# Patient Record
Sex: Female | Born: 1960 | Race: Black or African American | Hispanic: No | Smoking: Never smoker
Health system: Southern US, Community
[De-identification: ages and names within clinical notes are randomized; demographics above are authoritative.]

## PROBLEM LIST (undated history)

## (undated) DIAGNOSIS — K219 Gastro-esophageal reflux disease without esophagitis: Secondary | ICD-10-CM

## (undated) DIAGNOSIS — M75102 Unspecified rotator cuff tear or rupture of left shoulder, not specified as traumatic: Secondary | ICD-10-CM

## (undated) DIAGNOSIS — K644 Residual hemorrhoidal skin tags: Secondary | ICD-10-CM

## (undated) DIAGNOSIS — Z8719 Personal history of other diseases of the digestive system: Secondary | ICD-10-CM

## (undated) DIAGNOSIS — I10 Essential (primary) hypertension: Secondary | ICD-10-CM

## (undated) DIAGNOSIS — Z973 Presence of spectacles and contact lenses: Secondary | ICD-10-CM

## (undated) DIAGNOSIS — F32A Depression, unspecified: Secondary | ICD-10-CM

## (undated) DIAGNOSIS — M179 Osteoarthritis of knee, unspecified: Secondary | ICD-10-CM

## (undated) DIAGNOSIS — E785 Hyperlipidemia, unspecified: Secondary | ICD-10-CM

## (undated) DIAGNOSIS — E119 Type 2 diabetes mellitus without complications: Secondary | ICD-10-CM

## (undated) DIAGNOSIS — F329 Major depressive disorder, single episode, unspecified: Secondary | ICD-10-CM

## (undated) DIAGNOSIS — I2699 Other pulmonary embolism without acute cor pulmonale: Secondary | ICD-10-CM

## (undated) DIAGNOSIS — M1710 Unilateral primary osteoarthritis, unspecified knee: Secondary | ICD-10-CM

## (undated) DIAGNOSIS — K6289 Other specified diseases of anus and rectum: Secondary | ICD-10-CM

## (undated) DIAGNOSIS — G5602 Carpal tunnel syndrome, left upper limb: Secondary | ICD-10-CM

## (undated) DIAGNOSIS — M5137 Other intervertebral disc degeneration, lumbosacral region: Secondary | ICD-10-CM

## (undated) DIAGNOSIS — M1990 Unspecified osteoarthritis, unspecified site: Secondary | ICD-10-CM

## (undated) DIAGNOSIS — Z87442 Personal history of urinary calculi: Secondary | ICD-10-CM

## (undated) DIAGNOSIS — E669 Obesity, unspecified: Secondary | ICD-10-CM

## (undated) DIAGNOSIS — N938 Other specified abnormal uterine and vaginal bleeding: Secondary | ICD-10-CM

## (undated) DIAGNOSIS — J189 Pneumonia, unspecified organism: Secondary | ICD-10-CM

## (undated) DIAGNOSIS — D649 Anemia, unspecified: Secondary | ICD-10-CM

## (undated) DIAGNOSIS — F419 Anxiety disorder, unspecified: Secondary | ICD-10-CM

## (undated) DIAGNOSIS — Z794 Long term (current) use of insulin: Secondary | ICD-10-CM

## (undated) DIAGNOSIS — E042 Nontoxic multinodular goiter: Secondary | ICD-10-CM

## (undated) DIAGNOSIS — I499 Cardiac arrhythmia, unspecified: Secondary | ICD-10-CM

## (undated) DIAGNOSIS — N390 Urinary tract infection, site not specified: Secondary | ICD-10-CM

## (undated) DIAGNOSIS — K449 Diaphragmatic hernia without obstruction or gangrene: Secondary | ICD-10-CM

## (undated) DIAGNOSIS — M51379 Other intervertebral disc degeneration, lumbosacral region without mention of lumbar back pain or lower extremity pain: Secondary | ICD-10-CM

## (undated) HISTORY — DX: Residual hemorrhoidal skin tags: K64.4

## (undated) HISTORY — DX: Hyperlipidemia, unspecified: E78.5

## (undated) HISTORY — DX: Urinary tract infection, site not specified: N39.0

## (undated) HISTORY — PX: REPLACEMENT TOTAL KNEE BILATERAL: SUR1225

## (undated) HISTORY — DX: Gastro-esophageal reflux disease without esophagitis: K21.9

## (undated) HISTORY — PX: KNEE ARTHROSCOPY: SUR90

## (undated) HISTORY — DX: Major depressive disorder, single episode, unspecified: F32.9

## (undated) HISTORY — PX: CYSTOSCOPY/URETEROSCOPY/HOLMIUM LASER/STENT PLACEMENT: SHX6546

## (undated) HISTORY — DX: Depression, unspecified: F32.A

## (undated) HISTORY — DX: Other specified abnormal uterine and vaginal bleeding: N93.8

## (undated) HISTORY — PX: CHOLECYSTECTOMY: SHX55

## (undated) HISTORY — DX: Type 2 diabetes mellitus without complications: E11.9

## (undated) HISTORY — DX: Unspecified rotator cuff tear or rupture of left shoulder, not specified as traumatic: M75.102

## (undated) HISTORY — PX: LITHOTRIPSY: SUR834

## (undated) HISTORY — DX: Unilateral primary osteoarthritis, unspecified knee: M17.10

## (undated) HISTORY — PX: TUBAL LIGATION: SHX77

## (undated) HISTORY — PX: JOINT REPLACEMENT: SHX530

## (undated) HISTORY — PX: HERNIA REPAIR: SHX51

## (undated) HISTORY — DX: Osteoarthritis of knee, unspecified: M17.9

## (undated) HISTORY — DX: Morbid (severe) obesity due to excess calories: E66.01

## (undated) HISTORY — DX: Carpal tunnel syndrome, left upper limb: G56.02

## (undated) HISTORY — DX: Anxiety disorder, unspecified: F41.9

## (undated) HISTORY — DX: Essential (primary) hypertension: I10

---

## 1986-04-11 HISTORY — PX: CHOLECYSTECTOMY OPEN: SUR202

## 1997-09-25 ENCOUNTER — Encounter: Admission: RE | Admit: 1997-09-25 | Discharge: 1997-09-25 | Payer: Self-pay | Admitting: Hematology and Oncology

## 1997-09-25 ENCOUNTER — Encounter: Admission: RE | Admit: 1997-09-25 | Discharge: 1997-09-25 | Payer: Self-pay | Admitting: Obstetrics

## 1997-11-03 ENCOUNTER — Encounter: Admission: RE | Admit: 1997-11-03 | Discharge: 1997-11-03 | Payer: Self-pay | Admitting: Internal Medicine

## 1998-04-02 ENCOUNTER — Encounter: Admission: RE | Admit: 1998-04-02 | Discharge: 1998-04-02 | Payer: Self-pay | Admitting: Hematology and Oncology

## 1998-04-21 ENCOUNTER — Encounter: Admission: RE | Admit: 1998-04-21 | Discharge: 1998-07-20 | Payer: Self-pay | Admitting: Orthopedic Surgery

## 1998-08-25 ENCOUNTER — Encounter: Admission: RE | Admit: 1998-08-25 | Discharge: 1998-08-25 | Payer: Self-pay | Admitting: Internal Medicine

## 1998-09-29 ENCOUNTER — Encounter: Admission: RE | Admit: 1998-09-29 | Discharge: 1998-09-29 | Payer: Self-pay | Admitting: Internal Medicine

## 1998-10-15 ENCOUNTER — Encounter: Admission: RE | Admit: 1998-10-15 | Discharge: 1998-10-15 | Payer: Self-pay | Admitting: Hematology and Oncology

## 1998-11-25 ENCOUNTER — Encounter: Admission: RE | Admit: 1998-11-25 | Discharge: 1998-11-25 | Payer: Self-pay | Admitting: Internal Medicine

## 1999-01-21 ENCOUNTER — Encounter: Admission: RE | Admit: 1999-01-21 | Discharge: 1999-01-21 | Payer: Self-pay | Admitting: Hematology and Oncology

## 1999-03-25 ENCOUNTER — Encounter: Admission: RE | Admit: 1999-03-25 | Discharge: 1999-03-25 | Payer: Self-pay | Admitting: Obstetrics

## 1999-03-30 ENCOUNTER — Encounter: Admission: RE | Admit: 1999-03-30 | Discharge: 1999-03-30 | Payer: Self-pay | Admitting: Internal Medicine

## 1999-05-12 ENCOUNTER — Emergency Department (HOSPITAL_COMMUNITY): Admission: EM | Admit: 1999-05-12 | Discharge: 1999-05-12 | Payer: Self-pay | Admitting: Emergency Medicine

## 1999-06-09 ENCOUNTER — Encounter: Admission: RE | Admit: 1999-06-09 | Discharge: 1999-06-09 | Payer: Self-pay | Admitting: Internal Medicine

## 1999-11-08 ENCOUNTER — Encounter: Admission: RE | Admit: 1999-11-08 | Discharge: 1999-11-08 | Payer: Self-pay | Admitting: Internal Medicine

## 1999-12-01 ENCOUNTER — Ambulatory Visit (HOSPITAL_BASED_OUTPATIENT_CLINIC_OR_DEPARTMENT_OTHER): Admission: RE | Admit: 1999-12-01 | Discharge: 1999-12-01 | Payer: Self-pay | Admitting: Orthopedic Surgery

## 2000-05-02 ENCOUNTER — Inpatient Hospital Stay (HOSPITAL_COMMUNITY): Admission: AD | Admit: 2000-05-02 | Discharge: 2000-05-02 | Payer: Self-pay | Admitting: *Deleted

## 2000-05-03 ENCOUNTER — Inpatient Hospital Stay (HOSPITAL_COMMUNITY): Admission: AD | Admit: 2000-05-03 | Discharge: 2000-05-03 | Payer: Self-pay | Admitting: *Deleted

## 2000-05-03 ENCOUNTER — Encounter: Payer: Self-pay | Admitting: *Deleted

## 2000-05-03 ENCOUNTER — Inpatient Hospital Stay (HOSPITAL_COMMUNITY): Admission: AD | Admit: 2000-05-03 | Discharge: 2000-05-03 | Payer: Self-pay | Admitting: Obstetrics

## 2000-07-24 ENCOUNTER — Encounter: Admission: RE | Admit: 2000-07-24 | Discharge: 2000-07-24 | Payer: Self-pay

## 2000-10-25 ENCOUNTER — Encounter: Admission: RE | Admit: 2000-10-25 | Discharge: 2000-10-25 | Payer: Self-pay | Admitting: Orthopedic Surgery

## 2000-10-25 ENCOUNTER — Encounter: Payer: Self-pay | Admitting: Orthopedic Surgery

## 2000-11-02 ENCOUNTER — Emergency Department (HOSPITAL_COMMUNITY): Admission: EM | Admit: 2000-11-02 | Discharge: 2000-11-03 | Payer: Self-pay

## 2000-12-27 ENCOUNTER — Encounter: Admission: RE | Admit: 2000-12-27 | Discharge: 2000-12-27 | Payer: Self-pay | Admitting: Orthopedic Surgery

## 2000-12-27 ENCOUNTER — Encounter: Payer: Self-pay | Admitting: Orthopedic Surgery

## 2001-04-05 ENCOUNTER — Inpatient Hospital Stay (HOSPITAL_COMMUNITY): Admission: AD | Admit: 2001-04-05 | Discharge: 2001-04-05 | Payer: Self-pay | Admitting: Internal Medicine

## 2001-06-04 ENCOUNTER — Encounter: Payer: Self-pay | Admitting: *Deleted

## 2001-06-04 ENCOUNTER — Inpatient Hospital Stay (HOSPITAL_COMMUNITY): Admission: EM | Admit: 2001-06-04 | Discharge: 2001-06-05 | Payer: Self-pay | Admitting: Emergency Medicine

## 2001-08-30 ENCOUNTER — Emergency Department (HOSPITAL_COMMUNITY): Admission: EM | Admit: 2001-08-30 | Discharge: 2001-08-30 | Payer: Self-pay | Admitting: Emergency Medicine

## 2001-08-30 ENCOUNTER — Encounter: Payer: Self-pay | Admitting: Emergency Medicine

## 2001-09-12 ENCOUNTER — Emergency Department (HOSPITAL_COMMUNITY): Admission: EM | Admit: 2001-09-12 | Discharge: 2001-09-13 | Payer: Self-pay | Admitting: Emergency Medicine

## 2001-12-10 ENCOUNTER — Emergency Department (HOSPITAL_COMMUNITY): Admission: EM | Admit: 2001-12-10 | Discharge: 2001-12-10 | Payer: Self-pay | Admitting: Emergency Medicine

## 2002-02-05 ENCOUNTER — Encounter: Admission: RE | Admit: 2002-02-05 | Discharge: 2002-02-05 | Payer: Self-pay | Admitting: Internal Medicine

## 2002-04-05 ENCOUNTER — Encounter: Admission: RE | Admit: 2002-04-05 | Discharge: 2002-04-05 | Payer: Self-pay | Admitting: Internal Medicine

## 2002-05-16 ENCOUNTER — Encounter: Admission: RE | Admit: 2002-05-16 | Discharge: 2002-05-16 | Payer: Self-pay | Admitting: Internal Medicine

## 2002-05-20 ENCOUNTER — Encounter: Admission: RE | Admit: 2002-05-20 | Discharge: 2002-05-20 | Payer: Self-pay | Admitting: Internal Medicine

## 2002-06-18 ENCOUNTER — Encounter: Admission: RE | Admit: 2002-06-18 | Discharge: 2002-06-18 | Payer: Self-pay | Admitting: Internal Medicine

## 2002-06-27 ENCOUNTER — Encounter (INDEPENDENT_AMBULATORY_CARE_PROVIDER_SITE_OTHER): Payer: Self-pay

## 2002-06-27 ENCOUNTER — Encounter: Admission: RE | Admit: 2002-06-27 | Discharge: 2002-06-27 | Payer: Self-pay | Admitting: Obstetrics and Gynecology

## 2002-06-27 ENCOUNTER — Other Ambulatory Visit: Admission: RE | Admit: 2002-06-27 | Discharge: 2002-06-27 | Payer: Self-pay | Admitting: Family Medicine

## 2002-08-06 ENCOUNTER — Encounter: Admission: RE | Admit: 2002-08-06 | Discharge: 2002-08-06 | Payer: Self-pay | Admitting: Internal Medicine

## 2002-08-15 ENCOUNTER — Encounter: Admission: RE | Admit: 2002-08-15 | Discharge: 2002-08-15 | Payer: Self-pay | Admitting: Obstetrics and Gynecology

## 2002-09-13 ENCOUNTER — Encounter: Admission: RE | Admit: 2002-09-13 | Discharge: 2002-12-12 | Payer: Self-pay | Admitting: Internal Medicine

## 2002-10-07 ENCOUNTER — Encounter: Admission: RE | Admit: 2002-10-07 | Discharge: 2002-10-07 | Payer: Self-pay | Admitting: Internal Medicine

## 2002-12-25 ENCOUNTER — Encounter: Admission: RE | Admit: 2002-12-25 | Discharge: 2002-12-25 | Payer: Self-pay | Admitting: Internal Medicine

## 2003-02-05 ENCOUNTER — Encounter: Admission: RE | Admit: 2003-02-05 | Discharge: 2003-02-05 | Payer: Self-pay | Admitting: Internal Medicine

## 2003-04-02 ENCOUNTER — Emergency Department (HOSPITAL_COMMUNITY): Admission: AD | Admit: 2003-04-02 | Discharge: 2003-04-02 | Payer: Self-pay | Admitting: Family Medicine

## 2003-04-02 ENCOUNTER — Encounter: Admission: RE | Admit: 2003-04-02 | Discharge: 2003-04-02 | Payer: Self-pay | Admitting: Internal Medicine

## 2003-04-23 ENCOUNTER — Encounter: Admission: RE | Admit: 2003-04-23 | Discharge: 2003-04-23 | Payer: Self-pay | Admitting: Internal Medicine

## 2003-05-16 ENCOUNTER — Encounter: Admission: RE | Admit: 2003-05-16 | Discharge: 2003-05-16 | Payer: Self-pay | Admitting: Internal Medicine

## 2003-06-09 ENCOUNTER — Encounter: Admission: RE | Admit: 2003-06-09 | Discharge: 2003-06-09 | Payer: Self-pay | Admitting: Internal Medicine

## 2003-06-12 ENCOUNTER — Encounter: Admission: RE | Admit: 2003-06-12 | Discharge: 2003-06-12 | Payer: Self-pay | Admitting: Internal Medicine

## 2003-06-19 ENCOUNTER — Encounter: Admission: RE | Admit: 2003-06-19 | Discharge: 2003-06-19 | Payer: Self-pay | Admitting: Internal Medicine

## 2003-07-01 ENCOUNTER — Encounter: Admission: RE | Admit: 2003-07-01 | Discharge: 2003-07-01 | Payer: Self-pay | Admitting: Internal Medicine

## 2003-07-01 ENCOUNTER — Ambulatory Visit (HOSPITAL_COMMUNITY): Admission: RE | Admit: 2003-07-01 | Discharge: 2003-07-01 | Payer: Self-pay | Admitting: Internal Medicine

## 2003-09-18 ENCOUNTER — Encounter: Admission: RE | Admit: 2003-09-18 | Discharge: 2003-09-18 | Payer: Self-pay | Admitting: Internal Medicine

## 2003-11-21 ENCOUNTER — Encounter: Admission: RE | Admit: 2003-11-21 | Discharge: 2003-11-21 | Payer: Self-pay | Admitting: Internal Medicine

## 2003-12-26 ENCOUNTER — Emergency Department (HOSPITAL_COMMUNITY): Admission: EM | Admit: 2003-12-26 | Discharge: 2003-12-26 | Payer: Self-pay | Admitting: Family Medicine

## 2004-02-19 ENCOUNTER — Ambulatory Visit: Payer: Self-pay | Admitting: Family Medicine

## 2004-02-24 ENCOUNTER — Inpatient Hospital Stay (HOSPITAL_COMMUNITY): Admission: AD | Admit: 2004-02-24 | Discharge: 2004-02-24 | Payer: Self-pay | Admitting: Obstetrics & Gynecology

## 2004-02-29 ENCOUNTER — Emergency Department (HOSPITAL_COMMUNITY): Admission: EM | Admit: 2004-02-29 | Discharge: 2004-02-29 | Payer: Self-pay | Admitting: Family Medicine

## 2004-03-25 ENCOUNTER — Encounter (INDEPENDENT_AMBULATORY_CARE_PROVIDER_SITE_OTHER): Payer: Self-pay | Admitting: Specialist

## 2004-03-25 ENCOUNTER — Ambulatory Visit: Payer: Self-pay | Admitting: Family Medicine

## 2004-03-25 ENCOUNTER — Encounter (INDEPENDENT_AMBULATORY_CARE_PROVIDER_SITE_OTHER): Payer: Self-pay | Admitting: *Deleted

## 2004-03-25 ENCOUNTER — Other Ambulatory Visit: Admission: RE | Admit: 2004-03-25 | Discharge: 2004-03-25 | Payer: Self-pay | Admitting: Family Medicine

## 2004-04-23 ENCOUNTER — Emergency Department (HOSPITAL_COMMUNITY): Admission: EM | Admit: 2004-04-23 | Discharge: 2004-04-23 | Payer: Self-pay | Admitting: Family Medicine

## 2004-06-16 ENCOUNTER — Ambulatory Visit: Payer: Self-pay | Admitting: Internal Medicine

## 2004-06-17 ENCOUNTER — Ambulatory Visit (HOSPITAL_COMMUNITY): Admission: RE | Admit: 2004-06-17 | Discharge: 2004-06-17 | Payer: Self-pay | Admitting: *Deleted

## 2004-07-07 ENCOUNTER — Encounter: Admission: RE | Admit: 2004-07-07 | Discharge: 2004-07-07 | Payer: Self-pay | Admitting: Internal Medicine

## 2004-07-07 ENCOUNTER — Ambulatory Visit: Payer: Self-pay | Admitting: Internal Medicine

## 2004-07-07 ENCOUNTER — Encounter (INDEPENDENT_AMBULATORY_CARE_PROVIDER_SITE_OTHER): Payer: Self-pay | Admitting: Pulmonary Disease

## 2004-08-12 ENCOUNTER — Ambulatory Visit: Admission: RE | Admit: 2004-08-12 | Discharge: 2004-08-12 | Payer: Self-pay | Admitting: Internal Medicine

## 2004-08-12 ENCOUNTER — Ambulatory Visit: Payer: Self-pay | Admitting: Internal Medicine

## 2004-08-17 ENCOUNTER — Ambulatory Visit: Payer: Self-pay | Admitting: *Deleted

## 2004-10-15 ENCOUNTER — Ambulatory Visit: Payer: Self-pay | Admitting: Internal Medicine

## 2004-12-02 ENCOUNTER — Ambulatory Visit: Payer: Self-pay | Admitting: Internal Medicine

## 2004-12-08 ENCOUNTER — Ambulatory Visit: Payer: Self-pay | Admitting: Sports Medicine

## 2005-01-04 ENCOUNTER — Ambulatory Visit: Payer: Self-pay | Admitting: Internal Medicine

## 2005-03-08 ENCOUNTER — Emergency Department (HOSPITAL_COMMUNITY): Admission: EM | Admit: 2005-03-08 | Discharge: 2005-03-08 | Payer: Self-pay | Admitting: Family Medicine

## 2005-04-02 ENCOUNTER — Encounter: Admission: RE | Admit: 2005-04-02 | Discharge: 2005-04-02 | Payer: Self-pay | Admitting: Sports Medicine

## 2005-05-04 ENCOUNTER — Inpatient Hospital Stay (HOSPITAL_COMMUNITY): Admission: AD | Admit: 2005-05-04 | Discharge: 2005-05-05 | Payer: Self-pay | Admitting: Obstetrics and Gynecology

## 2005-05-09 ENCOUNTER — Ambulatory Visit: Payer: Self-pay | Admitting: Internal Medicine

## 2005-05-09 ENCOUNTER — Encounter (INDEPENDENT_AMBULATORY_CARE_PROVIDER_SITE_OTHER): Payer: Self-pay | Admitting: Pulmonary Disease

## 2005-06-07 ENCOUNTER — Ambulatory Visit (HOSPITAL_COMMUNITY): Admission: RE | Admit: 2005-06-07 | Discharge: 2005-06-07 | Payer: Self-pay | Admitting: Pulmonary Disease

## 2005-06-15 ENCOUNTER — Ambulatory Visit: Payer: Self-pay | Admitting: Internal Medicine

## 2005-06-29 ENCOUNTER — Ambulatory Visit: Payer: Self-pay | Admitting: Hospitalist

## 2005-07-05 ENCOUNTER — Ambulatory Visit (HOSPITAL_COMMUNITY): Admission: RE | Admit: 2005-07-05 | Discharge: 2005-07-05 | Payer: Self-pay | Admitting: Orthopedic Surgery

## 2005-09-22 ENCOUNTER — Ambulatory Visit: Payer: Self-pay | Admitting: Internal Medicine

## 2005-11-02 ENCOUNTER — Ambulatory Visit: Payer: Self-pay | Admitting: Family Medicine

## 2005-12-15 ENCOUNTER — Ambulatory Visit: Payer: Self-pay | Admitting: Obstetrics and Gynecology

## 2005-12-15 ENCOUNTER — Encounter: Payer: Self-pay | Admitting: Obstetrics and Gynecology

## 2005-12-15 ENCOUNTER — Encounter (INDEPENDENT_AMBULATORY_CARE_PROVIDER_SITE_OTHER): Payer: Self-pay | Admitting: Pulmonary Disease

## 2006-01-04 ENCOUNTER — Encounter (INDEPENDENT_AMBULATORY_CARE_PROVIDER_SITE_OTHER): Payer: Self-pay | Admitting: Pulmonary Disease

## 2006-01-04 LAB — CONVERTED CEMR LAB: Pap Smear: NORMAL

## 2006-01-10 ENCOUNTER — Encounter (INDEPENDENT_AMBULATORY_CARE_PROVIDER_SITE_OTHER): Payer: Self-pay | Admitting: Pulmonary Disease

## 2006-01-10 ENCOUNTER — Ambulatory Visit: Payer: Self-pay | Admitting: Internal Medicine

## 2006-01-11 ENCOUNTER — Encounter (INDEPENDENT_AMBULATORY_CARE_PROVIDER_SITE_OTHER): Payer: Self-pay | Admitting: Pulmonary Disease

## 2006-01-11 DIAGNOSIS — I10 Essential (primary) hypertension: Secondary | ICD-10-CM | POA: Insufficient documentation

## 2006-01-11 DIAGNOSIS — J309 Allergic rhinitis, unspecified: Secondary | ICD-10-CM | POA: Insufficient documentation

## 2006-01-11 DIAGNOSIS — N925 Other specified irregular menstruation: Secondary | ICD-10-CM | POA: Insufficient documentation

## 2006-01-11 DIAGNOSIS — N938 Other specified abnormal uterine and vaginal bleeding: Secondary | ICD-10-CM | POA: Insufficient documentation

## 2006-01-11 DIAGNOSIS — N949 Unspecified condition associated with female genital organs and menstrual cycle: Secondary | ICD-10-CM | POA: Insufficient documentation

## 2006-01-17 ENCOUNTER — Ambulatory Visit: Payer: Self-pay | Admitting: Internal Medicine

## 2006-02-15 ENCOUNTER — Ambulatory Visit: Payer: Self-pay | Admitting: Hospitalist

## 2006-03-31 ENCOUNTER — Encounter (INDEPENDENT_AMBULATORY_CARE_PROVIDER_SITE_OTHER): Payer: Self-pay | Admitting: Internal Medicine

## 2006-03-31 ENCOUNTER — Ambulatory Visit: Payer: Self-pay | Admitting: Internal Medicine

## 2006-03-31 LAB — CONVERTED CEMR LAB
BUN: 8 mg/dL (ref 6–23)
CO2: 28 meq/L (ref 19–32)
Calcium: 9 mg/dL (ref 8.4–10.5)
Chloride: 101 meq/L (ref 96–112)
Creatinine, Ser: 0.9 mg/dL (ref 0.40–1.20)
Glucose, Bld: 178 mg/dL — ABNORMAL HIGH (ref 70–99)
Potassium: 3.2 meq/L — ABNORMAL LOW (ref 3.5–5.3)
Sodium: 139 meq/L (ref 135–145)

## 2006-04-03 ENCOUNTER — Ambulatory Visit: Payer: Self-pay | Admitting: Internal Medicine

## 2006-04-03 ENCOUNTER — Encounter (INDEPENDENT_AMBULATORY_CARE_PROVIDER_SITE_OTHER): Payer: Self-pay | Admitting: Internal Medicine

## 2006-04-03 LAB — CONVERTED CEMR LAB
BUN: 10 mg/dL (ref 6–23)
CO2: 29 meq/L (ref 19–32)
Calcium: 9.1 mg/dL (ref 8.4–10.5)
Chloride: 97 meq/L (ref 96–112)
Creatinine, Ser: 1 mg/dL (ref 0.40–1.20)
Glucose, Bld: 299 mg/dL — ABNORMAL HIGH (ref 70–99)
Potassium: 3.4 meq/L — ABNORMAL LOW (ref 3.5–5.3)
Sodium: 136 meq/L (ref 135–145)

## 2006-04-14 ENCOUNTER — Ambulatory Visit: Payer: Self-pay | Admitting: Internal Medicine

## 2006-04-14 ENCOUNTER — Encounter (INDEPENDENT_AMBULATORY_CARE_PROVIDER_SITE_OTHER): Payer: Self-pay | Admitting: Internal Medicine

## 2006-04-14 LAB — CONVERTED CEMR LAB
BUN: 8 mg/dL (ref 6–23)
CO2: 26 meq/L (ref 19–32)
Calcium: 8.9 mg/dL (ref 8.4–10.5)
Chloride: 100 meq/L (ref 96–112)
Creatinine, Ser: 0.77 mg/dL (ref 0.40–1.20)
Glucose, Bld: 312 mg/dL — ABNORMAL HIGH (ref 70–99)
Potassium: 3.8 meq/L (ref 3.5–5.3)
Sodium: 136 meq/L (ref 135–145)

## 2006-04-26 DIAGNOSIS — F411 Generalized anxiety disorder: Secondary | ICD-10-CM | POA: Insufficient documentation

## 2006-04-26 DIAGNOSIS — E785 Hyperlipidemia, unspecified: Secondary | ICD-10-CM | POA: Insufficient documentation

## 2006-04-26 DIAGNOSIS — E1165 Type 2 diabetes mellitus with hyperglycemia: Secondary | ICD-10-CM | POA: Insufficient documentation

## 2006-04-26 DIAGNOSIS — G4733 Obstructive sleep apnea (adult) (pediatric): Secondary | ICD-10-CM | POA: Insufficient documentation

## 2006-05-02 ENCOUNTER — Telehealth (INDEPENDENT_AMBULATORY_CARE_PROVIDER_SITE_OTHER): Payer: Self-pay | Admitting: *Deleted

## 2006-05-09 ENCOUNTER — Telehealth (INDEPENDENT_AMBULATORY_CARE_PROVIDER_SITE_OTHER): Payer: Self-pay | Admitting: Internal Medicine

## 2006-06-13 ENCOUNTER — Telehealth (INDEPENDENT_AMBULATORY_CARE_PROVIDER_SITE_OTHER): Payer: Self-pay | Admitting: Internal Medicine

## 2006-06-21 ENCOUNTER — Ambulatory Visit: Payer: Self-pay | Admitting: *Deleted

## 2006-08-17 ENCOUNTER — Telehealth: Payer: Self-pay | Admitting: *Deleted

## 2006-09-01 ENCOUNTER — Telehealth (INDEPENDENT_AMBULATORY_CARE_PROVIDER_SITE_OTHER): Payer: Self-pay | Admitting: Infectious Diseases

## 2006-09-11 ENCOUNTER — Ambulatory Visit: Payer: Self-pay | Admitting: Obstetrics & Gynecology

## 2006-09-13 ENCOUNTER — Ambulatory Visit: Payer: Self-pay | Admitting: Obstetrics & Gynecology

## 2006-09-14 ENCOUNTER — Ambulatory Visit (HOSPITAL_COMMUNITY): Admission: RE | Admit: 2006-09-14 | Discharge: 2006-09-14 | Payer: Self-pay | Admitting: Obstetrics & Gynecology

## 2006-10-17 ENCOUNTER — Telehealth: Payer: Self-pay | Admitting: *Deleted

## 2006-10-20 ENCOUNTER — Ambulatory Visit: Payer: Self-pay | Admitting: Hospitalist

## 2006-10-20 ENCOUNTER — Encounter: Payer: Self-pay | Admitting: Internal Medicine

## 2006-10-20 LAB — CONVERTED CEMR LAB
Blood Glucose, Fingerstick: 277
Hgb A1c MFr Bld: 9.3 %

## 2006-10-22 LAB — CONVERTED CEMR LAB
BUN: 9 mg/dL (ref 6–23)
Basophils Absolute: 0 10*3/uL (ref 0.0–0.1)
Basophils Relative: 0 % (ref 0–1)
Bilirubin Urine: NEGATIVE
CO2: 25 meq/L (ref 19–32)
Calcium: 9.4 mg/dL (ref 8.4–10.5)
Chloride: 99 meq/L (ref 96–112)
Creatinine, Ser: 0.79 mg/dL (ref 0.40–1.20)
Eosinophils Absolute: 0.1 10*3/uL (ref 0.0–0.7)
Eosinophils Relative: 1 % (ref 0–5)
Glucose, Bld: 216 mg/dL — ABNORMAL HIGH (ref 70–99)
HCT: 39.3 % (ref 36.0–46.0)
Hemoglobin: 13 g/dL (ref 12.0–15.0)
Ketones, ur: NEGATIVE mg/dL
Leukocytes, UA: NEGATIVE
Lymphocytes Relative: 38 % (ref 12–46)
Lymphs Abs: 3.7 10*3/uL — ABNORMAL HIGH (ref 0.7–3.3)
MCHC: 33.1 g/dL (ref 30.0–36.0)
MCV: 89.3 fL (ref 78.0–100.0)
Monocytes Absolute: 0.5 10*3/uL (ref 0.2–0.7)
Monocytes Relative: 5 % (ref 3–11)
Neutro Abs: 5.4 10*3/uL (ref 1.7–7.7)
Neutrophils Relative %: 55 % (ref 43–77)
Nitrite: NEGATIVE
Platelets: 460 10*3/uL — ABNORMAL HIGH (ref 150–400)
Potassium: 4 meq/L (ref 3.5–5.3)
Protein, ur: NEGATIVE mg/dL
RBC: 4.4 M/uL (ref 3.87–5.11)
RDW: 13.3 % (ref 11.5–14.0)
Sodium: 139 meq/L (ref 135–145)
Specific Gravity, Urine: 1.015 (ref 1.005–1.03)
Urine Glucose: NEGATIVE mg/dL
Urobilinogen, UA: 0.2 (ref 0.0–1.0)
WBC: 9.9 10*3/uL (ref 4.0–10.5)
pH: 5.5 (ref 5.0–8.0)

## 2006-10-23 ENCOUNTER — Ambulatory Visit: Payer: Self-pay | Admitting: *Deleted

## 2006-10-23 ENCOUNTER — Telehealth: Payer: Self-pay | Admitting: *Deleted

## 2006-10-23 LAB — CONVERTED CEMR LAB: Blood Glucose, Fingerstick: 262

## 2006-10-24 ENCOUNTER — Ambulatory Visit: Payer: Self-pay | Admitting: *Deleted

## 2006-10-24 LAB — CONVERTED CEMR LAB: Blood Glucose, Home Monitor: 1 mg/dL

## 2006-11-13 ENCOUNTER — Telehealth (INDEPENDENT_AMBULATORY_CARE_PROVIDER_SITE_OTHER): Payer: Self-pay | Admitting: *Deleted

## 2006-12-08 ENCOUNTER — Telehealth: Payer: Self-pay | Admitting: *Deleted

## 2006-12-19 ENCOUNTER — Telehealth (INDEPENDENT_AMBULATORY_CARE_PROVIDER_SITE_OTHER): Payer: Self-pay | Admitting: Pharmacy Technician

## 2006-12-20 ENCOUNTER — Telehealth (INDEPENDENT_AMBULATORY_CARE_PROVIDER_SITE_OTHER): Payer: Self-pay | Admitting: Pharmacy Technician

## 2006-12-28 ENCOUNTER — Encounter: Payer: Self-pay | Admitting: Internal Medicine

## 2006-12-28 ENCOUNTER — Ambulatory Visit: Payer: Self-pay | Admitting: Hospitalist

## 2006-12-28 LAB — CONVERTED CEMR LAB
Blood Glucose, Fingerstick: 157
Hgb A1c MFr Bld: 7.5 %

## 2007-01-03 ENCOUNTER — Telehealth: Payer: Self-pay | Admitting: *Deleted

## 2007-01-04 ENCOUNTER — Telehealth: Payer: Self-pay | Admitting: *Deleted

## 2007-01-04 ENCOUNTER — Encounter: Payer: Self-pay | Admitting: Internal Medicine

## 2007-03-13 ENCOUNTER — Encounter (INDEPENDENT_AMBULATORY_CARE_PROVIDER_SITE_OTHER): Payer: Self-pay | Admitting: Internal Medicine

## 2007-03-13 ENCOUNTER — Ambulatory Visit: Payer: Self-pay | Admitting: Internal Medicine

## 2007-03-13 ENCOUNTER — Encounter: Payer: Self-pay | Admitting: Internal Medicine

## 2007-03-13 LAB — CONVERTED CEMR LAB
ALT: 12 units/L (ref 0–35)
AST: 17 units/L (ref 0–37)
Albumin: 3.9 g/dL (ref 3.5–5.2)
Alkaline Phosphatase: 90 units/L (ref 39–117)
BUN: 16 mg/dL (ref 6–23)
Blood Glucose, Fingerstick: 199
CO2: 28 meq/L (ref 19–32)
Calcium: 9.2 mg/dL (ref 8.4–10.5)
Chloride: 99 meq/L (ref 96–112)
Creatinine, Ser: 0.85 mg/dL (ref 0.40–1.20)
Glucose, Bld: 144 mg/dL — ABNORMAL HIGH (ref 70–99)
Hgb A1c MFr Bld: 7.1 %
Potassium: 3.8 meq/L (ref 3.5–5.3)
Rhuematoid fact SerPl-aCnc: <20 intl units/mL (ref 0–20)
Sed Rate: 33 mm/hr — ABNORMAL HIGH (ref 0–22)
Sodium: 143 meq/L (ref 135–145)
Total Bilirubin: 0.5 mg/dL (ref 0.3–1.2)
Total Protein: 7.3 g/dL (ref 6.0–8.3)

## 2007-03-19 ENCOUNTER — Encounter: Payer: Self-pay | Admitting: Internal Medicine

## 2007-03-20 ENCOUNTER — Encounter: Payer: Self-pay | Admitting: Internal Medicine

## 2007-03-20 ENCOUNTER — Ambulatory Visit: Payer: Self-pay | Admitting: Internal Medicine

## 2007-03-20 LAB — CONVERTED CEMR LAB
Bilirubin Urine: NEGATIVE
Blood Glucose, Fingerstick: 178
Glucose, Urine, Semiquant: NEGATIVE
Nitrite: POSITIVE
Protein, U semiquant: 30
Specific Gravity, Urine: >=1.03
Urobilinogen, UA: 0.2
WBC Urine, dipstick: NEGATIVE
pH: 5

## 2007-03-23 ENCOUNTER — Telehealth: Payer: Self-pay | Admitting: *Deleted

## 2007-04-03 ENCOUNTER — Telehealth: Payer: Self-pay | Admitting: Internal Medicine

## 2007-04-09 ENCOUNTER — Telehealth: Payer: Self-pay | Admitting: *Deleted

## 2007-04-09 ENCOUNTER — Encounter: Payer: Self-pay | Admitting: Internal Medicine

## 2007-04-11 ENCOUNTER — Telehealth: Payer: Self-pay | Admitting: Internal Medicine

## 2007-04-18 ENCOUNTER — Telehealth: Payer: Self-pay | Admitting: *Deleted

## 2007-05-02 ENCOUNTER — Ambulatory Visit: Payer: Self-pay | Admitting: Internal Medicine

## 2007-05-02 ENCOUNTER — Encounter: Payer: Self-pay | Admitting: Internal Medicine

## 2007-05-02 DIAGNOSIS — N390 Urinary tract infection, site not specified: Secondary | ICD-10-CM | POA: Insufficient documentation

## 2007-05-06 LAB — CONVERTED CEMR LAB
Bilirubin Urine: NEGATIVE
Hemoglobin, Urine: NEGATIVE
Ketones, ur: NEGATIVE mg/dL
Leukocytes, UA: NEGATIVE
Nitrite: NEGATIVE
Protein, ur: NEGATIVE mg/dL
Specific Gravity, Urine: 1.025 (ref 1.005–1.03)
Urine Glucose: NEGATIVE mg/dL
Urobilinogen, UA: 0.2 (ref 0.0–1.0)
pH: 6.5 (ref 5.0–8.0)

## 2007-05-19 ENCOUNTER — Emergency Department (HOSPITAL_COMMUNITY): Admission: EM | Admit: 2007-05-19 | Discharge: 2007-05-20 | Payer: Self-pay | Admitting: Family Medicine

## 2007-05-20 ENCOUNTER — Ambulatory Visit: Payer: Self-pay | Admitting: Infectious Diseases

## 2007-05-20 ENCOUNTER — Observation Stay (HOSPITAL_COMMUNITY): Admission: AD | Admit: 2007-05-20 | Discharge: 2007-05-21 | Payer: Self-pay | Admitting: Infectious Diseases

## 2007-05-21 ENCOUNTER — Encounter (INDEPENDENT_AMBULATORY_CARE_PROVIDER_SITE_OTHER): Payer: Self-pay | Admitting: Internal Medicine

## 2007-06-04 ENCOUNTER — Telehealth: Payer: Self-pay | Admitting: Internal Medicine

## 2007-06-06 ENCOUNTER — Ambulatory Visit (HOSPITAL_COMMUNITY): Admission: RE | Admit: 2007-06-06 | Discharge: 2007-06-06 | Payer: Self-pay | Admitting: Urology

## 2007-06-09 ENCOUNTER — Emergency Department (HOSPITAL_COMMUNITY): Admission: EM | Admit: 2007-06-09 | Discharge: 2007-06-09 | Payer: Self-pay | Admitting: Emergency Medicine

## 2007-07-10 ENCOUNTER — Telehealth: Payer: Self-pay | Admitting: Internal Medicine

## 2007-08-07 ENCOUNTER — Encounter (INDEPENDENT_AMBULATORY_CARE_PROVIDER_SITE_OTHER): Payer: Self-pay | Admitting: Internal Medicine

## 2007-08-15 ENCOUNTER — Telehealth (INDEPENDENT_AMBULATORY_CARE_PROVIDER_SITE_OTHER): Payer: Self-pay | Admitting: *Deleted

## 2007-08-16 ENCOUNTER — Telehealth (INDEPENDENT_AMBULATORY_CARE_PROVIDER_SITE_OTHER): Payer: Self-pay | Admitting: *Deleted

## 2007-08-29 ENCOUNTER — Telehealth (INDEPENDENT_AMBULATORY_CARE_PROVIDER_SITE_OTHER): Payer: Self-pay | Admitting: *Deleted

## 2007-09-25 ENCOUNTER — Telehealth: Payer: Self-pay | Admitting: Internal Medicine

## 2007-09-26 ENCOUNTER — Telehealth: Payer: Self-pay | Admitting: Internal Medicine

## 2007-10-09 ENCOUNTER — Telehealth: Payer: Self-pay | Admitting: Internal Medicine

## 2007-10-30 ENCOUNTER — Emergency Department (HOSPITAL_COMMUNITY): Admission: EM | Admit: 2007-10-30 | Discharge: 2007-10-30 | Payer: Self-pay | Admitting: Emergency Medicine

## 2007-11-04 ENCOUNTER — Encounter (INDEPENDENT_AMBULATORY_CARE_PROVIDER_SITE_OTHER): Payer: Self-pay | Admitting: Internal Medicine

## 2007-11-07 ENCOUNTER — Telehealth: Payer: Self-pay | Admitting: Internal Medicine

## 2007-11-07 ENCOUNTER — Ambulatory Visit (HOSPITAL_COMMUNITY): Admission: RE | Admit: 2007-11-07 | Discharge: 2007-11-07 | Payer: Self-pay | Admitting: Urology

## 2007-11-22 ENCOUNTER — Telehealth: Payer: Self-pay | Admitting: Internal Medicine

## 2007-11-30 ENCOUNTER — Telehealth: Payer: Self-pay | Admitting: Internal Medicine

## 2007-12-03 ENCOUNTER — Encounter: Payer: Self-pay | Admitting: Internal Medicine

## 2007-12-20 ENCOUNTER — Telehealth: Payer: Self-pay | Admitting: *Deleted

## 2008-01-23 ENCOUNTER — Telehealth: Payer: Self-pay | Admitting: Internal Medicine

## 2008-02-16 ENCOUNTER — Telehealth (INDEPENDENT_AMBULATORY_CARE_PROVIDER_SITE_OTHER): Payer: Self-pay | Admitting: Internal Medicine

## 2008-02-18 ENCOUNTER — Telehealth: Payer: Self-pay | Admitting: Internal Medicine

## 2008-02-18 ENCOUNTER — Encounter: Payer: Self-pay | Admitting: Internal Medicine

## 2008-02-25 ENCOUNTER — Encounter: Payer: Self-pay | Admitting: Internal Medicine

## 2008-02-25 ENCOUNTER — Ambulatory Visit: Payer: Self-pay | Admitting: Infectious Diseases

## 2008-02-25 DIAGNOSIS — K219 Gastro-esophageal reflux disease without esophagitis: Secondary | ICD-10-CM | POA: Insufficient documentation

## 2008-02-25 LAB — CONVERTED CEMR LAB
ALT: 12 units/L (ref 0–35)
AST: 9 units/L (ref 0–37)
Albumin: 4.1 g/dL (ref 3.5–5.2)
Alkaline Phosphatase: 82 units/L (ref 39–117)
BUN: 24 mg/dL — ABNORMAL HIGH (ref 6–23)
Basophils Absolute: 0 10*3/uL (ref 0.0–0.1)
Basophils Relative: 0 % (ref 0–1)
Blood Glucose, Fingerstick: 135
CO2: 26 meq/L (ref 19–32)
Calcium: 9.3 mg/dL (ref 8.4–10.5)
Chloride: 99 meq/L (ref 96–112)
Cholesterol: 156 mg/dL (ref 0–200)
Creatinine, Ser: 1.02 mg/dL (ref 0.40–1.20)
Creatinine, Urine: 206.8 mg/dL
Eosinophils Absolute: 0.1 10*3/uL (ref 0.0–0.7)
Eosinophils Relative: 1 % (ref 0–5)
Glucose, Bld: 114 mg/dL — ABNORMAL HIGH (ref 70–99)
HCT: 38.9 % (ref 36.0–46.0)
HDL: 75 mg/dL (ref >39)
Hemoglobin: 12 g/dL (ref 12.0–15.0)
Hgb A1c MFr Bld: 7.1 %
LDL Cholesterol: 67 mg/dL (ref 0–99)
Lymphocytes Relative: 34 % (ref 12–46)
Lymphs Abs: 3 10*3/uL (ref 0.7–4.0)
MCHC: 30.8 g/dL (ref 30.0–36.0)
MCV: 87.8 fL (ref 78.0–100.0)
Microalb Creat Ratio: 7.4 mg/g (ref 0.0–30.0)
Microalb, Ur: 1.52 mg/dL (ref 0.00–1.89)
Monocytes Absolute: 0.4 10*3/uL (ref 0.1–1.0)
Monocytes Relative: 5 % (ref 3–12)
Neutro Abs: 5.2 10*3/uL (ref 1.7–7.7)
Neutrophils Relative %: 60 % (ref 43–77)
Platelets: 391 10*3/uL (ref 150–400)
Potassium: 4 meq/L (ref 3.5–5.3)
RBC: 4.43 M/uL (ref 3.87–5.11)
RDW: 13.4 % (ref 11.5–15.5)
Sodium: 140 meq/L (ref 135–145)
TSH: 1.774 microintl units/mL (ref 0.350–4.50)
Total Bilirubin: 0.4 mg/dL (ref 0.3–1.2)
Total CHOL/HDL Ratio: 2.1
Total Protein: 7.4 g/dL (ref 6.0–8.3)
Triglycerides: 72 mg/dL (ref <150)
VLDL: 14 mg/dL (ref 0–40)
WBC: 8.7 10*3/uL (ref 4.0–10.5)

## 2008-02-27 ENCOUNTER — Encounter: Payer: Self-pay | Admitting: Licensed Clinical Social Worker

## 2008-03-03 ENCOUNTER — Encounter: Payer: Self-pay | Admitting: Internal Medicine

## 2008-03-07 ENCOUNTER — Emergency Department (HOSPITAL_BASED_OUTPATIENT_CLINIC_OR_DEPARTMENT_OTHER): Admission: EM | Admit: 2008-03-07 | Discharge: 2008-03-07 | Payer: Self-pay | Admitting: Emergency Medicine

## 2008-03-21 ENCOUNTER — Encounter: Payer: Self-pay | Admitting: Internal Medicine

## 2008-04-09 ENCOUNTER — Telehealth: Payer: Self-pay | Admitting: Internal Medicine

## 2008-04-15 ENCOUNTER — Encounter (INDEPENDENT_AMBULATORY_CARE_PROVIDER_SITE_OTHER): Payer: Self-pay | Admitting: Internal Medicine

## 2008-04-23 ENCOUNTER — Encounter: Admission: RE | Admit: 2008-04-23 | Discharge: 2008-04-23 | Payer: Self-pay | Admitting: Sports Medicine

## 2008-04-25 ENCOUNTER — Encounter: Payer: Self-pay | Admitting: Internal Medicine

## 2008-04-26 ENCOUNTER — Emergency Department (HOSPITAL_BASED_OUTPATIENT_CLINIC_OR_DEPARTMENT_OTHER): Admission: EM | Admit: 2008-04-26 | Discharge: 2008-04-26 | Payer: Self-pay | Admitting: Emergency Medicine

## 2008-04-26 ENCOUNTER — Ambulatory Visit: Payer: Self-pay | Admitting: Diagnostic Radiology

## 2008-05-07 ENCOUNTER — Telehealth: Payer: Self-pay | Admitting: Internal Medicine

## 2008-05-21 ENCOUNTER — Encounter: Payer: Self-pay | Admitting: Internal Medicine

## 2008-05-21 ENCOUNTER — Telehealth: Payer: Self-pay | Admitting: Internal Medicine

## 2008-05-31 ENCOUNTER — Emergency Department (HOSPITAL_COMMUNITY): Admission: EM | Admit: 2008-05-31 | Discharge: 2008-05-31 | Payer: Self-pay | Admitting: Emergency Medicine

## 2008-06-12 ENCOUNTER — Ambulatory Visit: Payer: Self-pay | Admitting: Internal Medicine

## 2008-06-22 ENCOUNTER — Telehealth (INDEPENDENT_AMBULATORY_CARE_PROVIDER_SITE_OTHER): Payer: Self-pay | Admitting: Internal Medicine

## 2008-06-30 ENCOUNTER — Telehealth: Payer: Self-pay | Admitting: Internal Medicine

## 2008-07-08 ENCOUNTER — Encounter: Payer: Self-pay | Admitting: Internal Medicine

## 2008-07-17 ENCOUNTER — Telehealth: Payer: Self-pay | Admitting: Internal Medicine

## 2008-07-18 ENCOUNTER — Telehealth (INDEPENDENT_AMBULATORY_CARE_PROVIDER_SITE_OTHER): Payer: Self-pay | Admitting: *Deleted

## 2008-08-03 ENCOUNTER — Encounter (INDEPENDENT_AMBULATORY_CARE_PROVIDER_SITE_OTHER): Payer: Self-pay | Admitting: Internal Medicine

## 2008-08-11 ENCOUNTER — Telehealth: Payer: Self-pay | Admitting: *Deleted

## 2008-08-12 ENCOUNTER — Telehealth: Payer: Self-pay | Admitting: Internal Medicine

## 2008-08-13 ENCOUNTER — Telehealth: Payer: Self-pay | Admitting: Internal Medicine

## 2008-08-14 ENCOUNTER — Emergency Department (HOSPITAL_COMMUNITY): Admission: EM | Admit: 2008-08-14 | Discharge: 2008-08-14 | Payer: Self-pay | Admitting: Emergency Medicine

## 2008-08-22 ENCOUNTER — Encounter: Payer: Self-pay | Admitting: Internal Medicine

## 2008-08-26 ENCOUNTER — Ambulatory Visit (HOSPITAL_COMMUNITY): Admission: RE | Admit: 2008-08-26 | Discharge: 2008-08-26 | Payer: Self-pay | Admitting: *Deleted

## 2008-08-26 ENCOUNTER — Ambulatory Visit: Payer: Self-pay | Admitting: *Deleted

## 2008-08-26 ENCOUNTER — Encounter: Payer: Self-pay | Admitting: Internal Medicine

## 2008-08-26 LAB — CONVERTED CEMR LAB
ALT: 9 units/L (ref 0–35)
AST: 11 units/L (ref 0–37)
Albumin: 4 g/dL (ref 3.5–5.2)
Alkaline Phosphatase: 88 units/L (ref 39–117)
BUN: 19 mg/dL (ref 6–23)
CO2: 23 meq/L (ref 19–32)
Calcium: 9.7 mg/dL (ref 8.4–10.5)
Chloride: 101 meq/L (ref 96–112)
Cholesterol: 159 mg/dL (ref 0–200)
Creatinine, Ser: 1.02 mg/dL (ref 0.40–1.20)
GFR calc Af Amer: >60 mL/min (ref >60)
GFR calc non Af Amer: 58 mL/min — ABNORMAL LOW (ref >60)
Glucose, Bld: 210 mg/dL — ABNORMAL HIGH (ref 70–99)
HCT: 38.8 % (ref 36.0–46.0)
HDL: 67 mg/dL (ref >39)
Hemoglobin: 12.5 g/dL (ref 12.0–15.0)
LDL Cholesterol: 78 mg/dL (ref 0–99)
MCHC: 32.2 g/dL (ref 30.0–36.0)
MCV: 87.2 fL (ref 78.0–100.0)
Platelets: 384 10*3/uL (ref 150–400)
Potassium: 4.5 meq/L (ref 3.5–5.3)
RBC: 4.45 M/uL (ref 3.87–5.11)
RDW: 13.4 % (ref 11.5–15.5)
Sodium: 138 meq/L (ref 135–145)
TSH: 1.362 microintl units/mL (ref 0.350–4.500)
Total Bilirubin: 0.4 mg/dL (ref 0.3–1.2)
Total CHOL/HDL Ratio: 2.4
Total Protein: 7.5 g/dL (ref 6.0–8.3)
Triglycerides: 70 mg/dL (ref <150)
VLDL: 14 mg/dL (ref 0–40)
WBC: 9.7 10*3/uL (ref 4.0–10.5)

## 2008-08-28 ENCOUNTER — Telehealth: Payer: Self-pay | Admitting: Internal Medicine

## 2008-09-01 ENCOUNTER — Ambulatory Visit: Payer: Self-pay | Admitting: Family Medicine

## 2008-09-01 ENCOUNTER — Telehealth: Payer: Self-pay | Admitting: *Deleted

## 2008-09-02 ENCOUNTER — Telehealth: Payer: Self-pay | Admitting: Internal Medicine

## 2008-09-04 ENCOUNTER — Encounter: Payer: Self-pay | Admitting: Internal Medicine

## 2008-09-16 ENCOUNTER — Emergency Department (HOSPITAL_COMMUNITY): Admission: EM | Admit: 2008-09-16 | Discharge: 2008-09-16 | Payer: Self-pay | Admitting: Emergency Medicine

## 2008-09-16 ENCOUNTER — Telehealth: Payer: Self-pay | Admitting: Internal Medicine

## 2008-09-19 ENCOUNTER — Encounter: Payer: Self-pay | Admitting: Family Medicine

## 2008-09-19 DIAGNOSIS — M67919 Unspecified disorder of synovium and tendon, unspecified shoulder: Secondary | ICD-10-CM | POA: Insufficient documentation

## 2008-09-19 DIAGNOSIS — M719 Bursopathy, unspecified: Secondary | ICD-10-CM

## 2008-09-24 ENCOUNTER — Encounter: Payer: Self-pay | Admitting: Internal Medicine

## 2008-09-24 ENCOUNTER — Ambulatory Visit: Payer: Self-pay | Admitting: Internal Medicine

## 2008-09-24 ENCOUNTER — Other Ambulatory Visit: Admission: RE | Admit: 2008-09-24 | Discharge: 2008-09-24 | Payer: Self-pay | Admitting: Internal Medicine

## 2008-09-24 LAB — CONVERTED CEMR LAB
Blood Glucose, Fingerstick: 185
Hgb A1c MFr Bld: 8.1 %

## 2008-09-27 ENCOUNTER — Emergency Department (HOSPITAL_COMMUNITY): Admission: EM | Admit: 2008-09-27 | Discharge: 2008-09-27 | Payer: Self-pay | Admitting: Emergency Medicine

## 2008-10-06 ENCOUNTER — Telehealth: Payer: Self-pay | Admitting: Internal Medicine

## 2008-10-06 ENCOUNTER — Telehealth (INDEPENDENT_AMBULATORY_CARE_PROVIDER_SITE_OTHER): Payer: Self-pay | Admitting: Internal Medicine

## 2008-10-15 ENCOUNTER — Encounter: Payer: Self-pay | Admitting: Internal Medicine

## 2008-10-22 ENCOUNTER — Encounter: Payer: Self-pay | Admitting: Internal Medicine

## 2008-10-22 ENCOUNTER — Ambulatory Visit: Payer: Self-pay | Admitting: Internal Medicine

## 2008-10-22 LAB — CONVERTED CEMR LAB
Amphetamine Screen, Ur: NEGATIVE
Barbiturate Quant, Ur: NEGATIVE
Benzodiazepines.: NEGATIVE
Bilirubin Urine: NEGATIVE
Candida species: NEGATIVE
Chlamydia, DNA Probe: NEGATIVE
Cocaine Metabolites: NEGATIVE
Creatinine,U: 131 mg/dL
GC Probe Amp, Genital: NEGATIVE
Gardnerella vaginalis: NEGATIVE
Hemoglobin, Urine: NEGATIVE
Ketones, ur: NEGATIVE mg/dL
Leukocytes, UA: NEGATIVE
Marijuana Metabolite: NEGATIVE
Methadone: NEGATIVE
Nitrite: NEGATIVE
Opiates: NEGATIVE
Phencyclidine (PCP): NEGATIVE
Propoxyphene: NEGATIVE
Protein, ur: NEGATIVE mg/dL
Specific Gravity, Urine: 1.021 (ref 1.005–1.030)
Urine Glucose: NEGATIVE mg/dL
Urobilinogen, UA: 0.2 (ref 0.0–1.0)
pH: 5.5 (ref 5.0–8.0)

## 2008-10-23 ENCOUNTER — Encounter: Payer: Self-pay | Admitting: Internal Medicine

## 2008-10-31 ENCOUNTER — Encounter: Payer: Self-pay | Admitting: Family Medicine

## 2008-11-03 ENCOUNTER — Telehealth (INDEPENDENT_AMBULATORY_CARE_PROVIDER_SITE_OTHER): Payer: Self-pay | Admitting: Internal Medicine

## 2008-11-03 ENCOUNTER — Ambulatory Visit: Payer: Self-pay | Admitting: Family Medicine

## 2008-11-04 ENCOUNTER — Encounter: Payer: Self-pay | Admitting: Family Medicine

## 2008-11-04 ENCOUNTER — Ambulatory Visit: Payer: Self-pay | Admitting: Internal Medicine

## 2008-11-04 LAB — CONVERTED CEMR LAB: Blood Glucose, Fingerstick: 254

## 2008-11-05 ENCOUNTER — Encounter: Payer: Self-pay | Admitting: Internal Medicine

## 2008-11-12 ENCOUNTER — Encounter: Payer: Self-pay | Admitting: Internal Medicine

## 2008-11-18 ENCOUNTER — Telehealth: Payer: Self-pay | Admitting: Internal Medicine

## 2008-11-24 ENCOUNTER — Encounter: Admission: RE | Admit: 2008-11-24 | Discharge: 2008-11-24 | Payer: Self-pay | Admitting: Sports Medicine

## 2008-11-26 ENCOUNTER — Telehealth: Payer: Self-pay | Admitting: Internal Medicine

## 2008-12-04 ENCOUNTER — Ambulatory Visit: Payer: Self-pay | Admitting: Internal Medicine

## 2008-12-04 LAB — CONVERTED CEMR LAB: Blood Glucose, Fingerstick: 194

## 2008-12-05 ENCOUNTER — Encounter: Payer: Self-pay | Admitting: Internal Medicine

## 2008-12-05 LAB — CONVERTED CEMR LAB
Amphetamine Screen, Ur: NEGATIVE
Barbiturate Quant, Ur: NEGATIVE
Benzodiazepines.: NEGATIVE
Cocaine Metabolites: NEGATIVE
Creatinine,U: 79.7 mg/dL
Marijuana Metabolite: NEGATIVE
Methadone: NEGATIVE
Opiates: NEGATIVE
Phencyclidine (PCP): NEGATIVE
Propoxyphene: NEGATIVE

## 2008-12-08 ENCOUNTER — Encounter: Payer: Self-pay | Admitting: Internal Medicine

## 2008-12-10 ENCOUNTER — Telehealth: Payer: Self-pay | Admitting: Internal Medicine

## 2008-12-25 ENCOUNTER — Telehealth: Payer: Self-pay | Admitting: Internal Medicine

## 2008-12-26 ENCOUNTER — Emergency Department (HOSPITAL_COMMUNITY): Admission: EM | Admit: 2008-12-26 | Discharge: 2008-12-26 | Payer: Self-pay | Admitting: Emergency Medicine

## 2008-12-28 ENCOUNTER — Emergency Department (HOSPITAL_BASED_OUTPATIENT_CLINIC_OR_DEPARTMENT_OTHER): Admission: EM | Admit: 2008-12-28 | Discharge: 2008-12-29 | Payer: Self-pay | Admitting: Emergency Medicine

## 2008-12-29 ENCOUNTER — Ambulatory Visit: Payer: Self-pay | Admitting: Radiology

## 2008-12-31 ENCOUNTER — Encounter (INDEPENDENT_AMBULATORY_CARE_PROVIDER_SITE_OTHER): Payer: Self-pay | Admitting: Internal Medicine

## 2008-12-31 ENCOUNTER — Telehealth (INDEPENDENT_AMBULATORY_CARE_PROVIDER_SITE_OTHER): Payer: Self-pay | Admitting: Internal Medicine

## 2009-01-02 ENCOUNTER — Ambulatory Visit: Payer: Self-pay | Admitting: Sports Medicine

## 2009-01-19 ENCOUNTER — Telehealth: Payer: Self-pay | Admitting: Internal Medicine

## 2009-01-24 ENCOUNTER — Telehealth: Payer: Self-pay | Admitting: Internal Medicine

## 2009-01-26 ENCOUNTER — Ambulatory Visit: Payer: Self-pay | Admitting: Family Medicine

## 2009-01-26 DIAGNOSIS — G5600 Carpal tunnel syndrome, unspecified upper limb: Secondary | ICD-10-CM

## 2009-01-26 DIAGNOSIS — G5602 Carpal tunnel syndrome, left upper limb: Secondary | ICD-10-CM | POA: Insufficient documentation

## 2009-02-03 ENCOUNTER — Ambulatory Visit: Payer: Self-pay | Admitting: Internal Medicine

## 2009-02-03 LAB — CONVERTED CEMR LAB
Blood Glucose, AC Bkfst: 97 mg/dL
Hgb A1c MFr Bld: 7.6 %

## 2009-02-06 ENCOUNTER — Telehealth (INDEPENDENT_AMBULATORY_CARE_PROVIDER_SITE_OTHER): Payer: Self-pay | Admitting: *Deleted

## 2009-02-25 ENCOUNTER — Telehealth: Payer: Self-pay | Admitting: Internal Medicine

## 2009-02-25 ENCOUNTER — Telehealth (INDEPENDENT_AMBULATORY_CARE_PROVIDER_SITE_OTHER): Payer: Self-pay | Admitting: *Deleted

## 2009-03-06 ENCOUNTER — Telehealth: Payer: Self-pay | Admitting: Internal Medicine

## 2009-03-15 ENCOUNTER — Telehealth: Payer: Self-pay | Admitting: Internal Medicine

## 2009-03-16 ENCOUNTER — Telehealth: Payer: Self-pay | Admitting: Internal Medicine

## 2009-03-16 ENCOUNTER — Ambulatory Visit: Payer: Self-pay | Admitting: Internal Medicine

## 2009-03-19 ENCOUNTER — Telehealth: Payer: Self-pay | Admitting: *Deleted

## 2009-03-19 ENCOUNTER — Inpatient Hospital Stay (HOSPITAL_COMMUNITY): Admission: AD | Admit: 2009-03-19 | Discharge: 2009-03-19 | Payer: Self-pay | Admitting: Obstetrics and Gynecology

## 2009-03-27 ENCOUNTER — Encounter: Payer: Self-pay | Admitting: Internal Medicine

## 2009-03-27 ENCOUNTER — Telehealth: Payer: Self-pay | Admitting: Internal Medicine

## 2009-04-02 ENCOUNTER — Telehealth (INDEPENDENT_AMBULATORY_CARE_PROVIDER_SITE_OTHER): Payer: Self-pay | Admitting: *Deleted

## 2009-04-07 ENCOUNTER — Telehealth: Payer: Self-pay | Admitting: Internal Medicine

## 2009-04-22 ENCOUNTER — Emergency Department (HOSPITAL_COMMUNITY): Admission: EM | Admit: 2009-04-22 | Discharge: 2009-04-22 | Payer: Self-pay | Admitting: Emergency Medicine

## 2009-04-23 ENCOUNTER — Ambulatory Visit: Payer: Self-pay | Admitting: Internal Medicine

## 2009-04-23 DIAGNOSIS — N200 Calculus of kidney: Secondary | ICD-10-CM | POA: Insufficient documentation

## 2009-04-23 DIAGNOSIS — F32A Depression, unspecified: Secondary | ICD-10-CM | POA: Insufficient documentation

## 2009-04-23 DIAGNOSIS — F329 Major depressive disorder, single episode, unspecified: Secondary | ICD-10-CM | POA: Insufficient documentation

## 2009-04-23 LAB — CONVERTED CEMR LAB
Bilirubin Urine: NEGATIVE
Blood Glucose, Fingerstick: 152
Glucose, Urine, Semiquant: NEGATIVE
Hgb A1c MFr Bld: 8.2 %
Ketones, urine, test strip: NEGATIVE
Nitrite: NEGATIVE
Protein, U semiquant: NEGATIVE
Specific Gravity, Urine: 1.015
Urobilinogen, UA: 0.2
WBC Urine, dipstick: NEGATIVE
pH: 5

## 2009-04-24 LAB — CONVERTED CEMR LAB
Amphetamine Screen, Ur: NEGATIVE
Barbiturate Quant, Ur: NEGATIVE
Benzodiazepines.: NEGATIVE
Cocaine Metabolites: NEGATIVE
Creatinine,U: 85.5 mg/dL
Marijuana Metabolite: NEGATIVE
Methadone: NEGATIVE
Opiates: NEGATIVE
Phencyclidine (PCP): NEGATIVE
Propoxyphene: NEGATIVE

## 2009-05-20 ENCOUNTER — Telehealth (INDEPENDENT_AMBULATORY_CARE_PROVIDER_SITE_OTHER): Payer: Self-pay | Admitting: *Deleted

## 2009-06-01 ENCOUNTER — Ambulatory Visit: Payer: Self-pay | Admitting: Family Medicine

## 2009-06-17 ENCOUNTER — Encounter: Payer: Self-pay | Admitting: Internal Medicine

## 2009-06-17 ENCOUNTER — Ambulatory Visit: Payer: Self-pay | Admitting: Infectious Diseases

## 2009-06-17 DIAGNOSIS — K644 Residual hemorrhoidal skin tags: Secondary | ICD-10-CM | POA: Insufficient documentation

## 2009-06-17 LAB — CONVERTED CEMR LAB
BUN: 26 mg/dL — ABNORMAL HIGH (ref 6–23)
Blood Glucose, Fingerstick: 135
CO2: 28 meq/L (ref 19–32)
Calcium: 10 mg/dL (ref 8.4–10.5)
Chloride: 100 meq/L (ref 96–112)
Creatinine, Ser: 1.06 mg/dL (ref 0.40–1.20)
Glucose, Bld: 93 mg/dL (ref 70–99)
Potassium: 4.7 meq/L (ref 3.5–5.3)
Sodium: 140 meq/L (ref 135–145)

## 2009-06-21 ENCOUNTER — Telehealth (INDEPENDENT_AMBULATORY_CARE_PROVIDER_SITE_OTHER): Payer: Self-pay | Admitting: Internal Medicine

## 2009-06-26 ENCOUNTER — Emergency Department (HOSPITAL_COMMUNITY): Admission: EM | Admit: 2009-06-26 | Discharge: 2009-06-26 | Payer: Self-pay | Admitting: Emergency Medicine

## 2009-06-28 ENCOUNTER — Telehealth (INDEPENDENT_AMBULATORY_CARE_PROVIDER_SITE_OTHER): Payer: Self-pay | Admitting: Internal Medicine

## 2009-06-29 ENCOUNTER — Encounter: Payer: Self-pay | Admitting: Internal Medicine

## 2009-07-05 ENCOUNTER — Telehealth: Payer: Self-pay | Admitting: Internal Medicine

## 2009-07-07 ENCOUNTER — Telehealth: Payer: Self-pay | Admitting: Internal Medicine

## 2009-07-08 ENCOUNTER — Encounter: Admission: RE | Admit: 2009-07-08 | Discharge: 2009-10-06 | Payer: Self-pay | Admitting: Orthopedic Surgery

## 2009-07-14 ENCOUNTER — Telehealth: Payer: Self-pay | Admitting: *Deleted

## 2009-07-16 ENCOUNTER — Encounter: Admission: RE | Admit: 2009-07-16 | Discharge: 2009-07-16 | Payer: Self-pay | Admitting: Sports Medicine

## 2009-07-21 ENCOUNTER — Encounter: Admission: RE | Admit: 2009-07-21 | Discharge: 2009-07-21 | Payer: Self-pay | Admitting: Sports Medicine

## 2009-08-04 ENCOUNTER — Encounter: Payer: Self-pay | Admitting: Internal Medicine

## 2009-08-10 ENCOUNTER — Telehealth: Payer: Self-pay | Admitting: Internal Medicine

## 2009-08-10 ENCOUNTER — Ambulatory Visit: Payer: Self-pay | Admitting: Internal Medicine

## 2009-08-10 LAB — CONVERTED CEMR LAB
Blood Glucose, AC Bkfst: 161 mg/dL
Hgb A1c MFr Bld: 8.2 %

## 2009-08-17 ENCOUNTER — Encounter: Payer: Self-pay | Admitting: Internal Medicine

## 2009-09-16 ENCOUNTER — Telehealth: Payer: Self-pay | Admitting: Internal Medicine

## 2009-09-23 ENCOUNTER — Ambulatory Visit (HOSPITAL_COMMUNITY): Payer: Self-pay | Admitting: Psychiatry

## 2009-09-24 ENCOUNTER — Telehealth: Payer: Self-pay | Admitting: Internal Medicine

## 2009-09-27 ENCOUNTER — Telehealth: Payer: Self-pay | Admitting: Internal Medicine

## 2009-09-28 ENCOUNTER — Telehealth (INDEPENDENT_AMBULATORY_CARE_PROVIDER_SITE_OTHER): Payer: Self-pay | Admitting: *Deleted

## 2009-09-28 ENCOUNTER — Emergency Department (HOSPITAL_COMMUNITY): Admission: EM | Admit: 2009-09-28 | Discharge: 2009-09-29 | Payer: Self-pay | Admitting: Emergency Medicine

## 2009-09-28 ENCOUNTER — Telehealth: Payer: Self-pay | Admitting: *Deleted

## 2009-09-29 ENCOUNTER — Encounter (INDEPENDENT_AMBULATORY_CARE_PROVIDER_SITE_OTHER): Payer: Self-pay | Admitting: Emergency Medicine

## 2009-09-29 ENCOUNTER — Ambulatory Visit: Payer: Self-pay | Admitting: Vascular Surgery

## 2009-09-30 ENCOUNTER — Encounter: Payer: Self-pay | Admitting: Licensed Clinical Social Worker

## 2009-09-30 ENCOUNTER — Encounter: Payer: Self-pay | Admitting: Internal Medicine

## 2009-09-30 ENCOUNTER — Ambulatory Visit: Payer: Self-pay | Admitting: Internal Medicine

## 2009-09-30 ENCOUNTER — Telehealth: Payer: Self-pay | Admitting: Internal Medicine

## 2009-09-30 LAB — CONVERTED CEMR LAB: Blood Glucose, Fingerstick: 176

## 2009-10-01 ENCOUNTER — Encounter: Payer: Self-pay | Admitting: Internal Medicine

## 2009-10-01 ENCOUNTER — Telehealth: Payer: Self-pay | Admitting: Licensed Clinical Social Worker

## 2009-10-01 LAB — CONVERTED CEMR LAB
Cholesterol: 160 mg/dL (ref 0–200)
HDL: 65 mg/dL (ref >39)
LDL Cholesterol: 76 mg/dL (ref 0–99)
Total CHOL/HDL Ratio: 2.5
Triglycerides: 94 mg/dL (ref <150)
VLDL: 19 mg/dL (ref 0–40)

## 2009-10-03 ENCOUNTER — Telehealth: Payer: Self-pay | Admitting: Internal Medicine

## 2009-10-05 ENCOUNTER — Telehealth: Payer: Self-pay | Admitting: Internal Medicine

## 2009-10-09 ENCOUNTER — Ambulatory Visit: Payer: Self-pay | Admitting: Family Medicine

## 2009-10-09 ENCOUNTER — Ambulatory Visit: Payer: Self-pay | Admitting: Internal Medicine

## 2009-10-09 DIAGNOSIS — M109 Gout, unspecified: Secondary | ICD-10-CM | POA: Insufficient documentation

## 2009-10-09 LAB — CONVERTED CEMR LAB
BUN: 17 mg/dL (ref 6–23)
CO2: 24 meq/L (ref 19–32)
Calcium: 9.4 mg/dL (ref 8.4–10.5)
Chloride: 98 meq/L (ref 96–112)
Creatinine, Ser: 0.97 mg/dL (ref 0.40–1.20)
Glucose, Bld: 399 mg/dL — ABNORMAL HIGH (ref 70–99)
Potassium: 4.3 meq/L (ref 3.5–5.3)
Sodium: 137 meq/L (ref 135–145)
Uric Acid, Serum: 7.6 mg/dL — ABNORMAL HIGH (ref 2.4–7.0)

## 2009-10-10 ENCOUNTER — Encounter: Payer: Self-pay | Admitting: Internal Medicine

## 2009-10-16 ENCOUNTER — Telehealth: Payer: Self-pay | Admitting: Internal Medicine

## 2009-10-16 ENCOUNTER — Emergency Department (HOSPITAL_COMMUNITY): Admission: EM | Admit: 2009-10-16 | Discharge: 2009-10-16 | Payer: Self-pay | Admitting: Family Medicine

## 2009-10-19 ENCOUNTER — Telehealth (INDEPENDENT_AMBULATORY_CARE_PROVIDER_SITE_OTHER): Payer: Self-pay | Admitting: *Deleted

## 2009-10-21 ENCOUNTER — Encounter: Payer: Self-pay | Admitting: Internal Medicine

## 2009-10-21 ENCOUNTER — Telehealth: Payer: Self-pay | Admitting: Internal Medicine

## 2009-10-26 ENCOUNTER — Telehealth: Payer: Self-pay | Admitting: *Deleted

## 2009-10-27 ENCOUNTER — Telehealth: Payer: Self-pay | Admitting: Internal Medicine

## 2009-11-15 ENCOUNTER — Telehealth: Payer: Self-pay | Admitting: Internal Medicine

## 2009-11-19 ENCOUNTER — Telehealth: Payer: Self-pay | Admitting: Internal Medicine

## 2009-11-26 ENCOUNTER — Telehealth: Payer: Self-pay | Admitting: *Deleted

## 2009-12-01 ENCOUNTER — Emergency Department (HOSPITAL_COMMUNITY): Admission: EM | Admit: 2009-12-01 | Discharge: 2009-12-02 | Payer: Self-pay | Admitting: Emergency Medicine

## 2009-12-11 ENCOUNTER — Ambulatory Visit: Payer: Self-pay | Admitting: Family Medicine

## 2009-12-11 DIAGNOSIS — M1710 Unilateral primary osteoarthritis, unspecified knee: Secondary | ICD-10-CM

## 2009-12-11 DIAGNOSIS — IMO0002 Reserved for concepts with insufficient information to code with codable children: Secondary | ICD-10-CM | POA: Insufficient documentation

## 2009-12-16 ENCOUNTER — Inpatient Hospital Stay (HOSPITAL_COMMUNITY): Admission: AD | Admit: 2009-12-16 | Discharge: 2009-12-16 | Payer: Self-pay | Admitting: Obstetrics & Gynecology

## 2009-12-16 ENCOUNTER — Ambulatory Visit: Payer: Self-pay | Admitting: Physician Assistant

## 2009-12-21 ENCOUNTER — Telehealth: Payer: Self-pay | Admitting: *Deleted

## 2009-12-25 ENCOUNTER — Emergency Department (HOSPITAL_BASED_OUTPATIENT_CLINIC_OR_DEPARTMENT_OTHER): Admission: EM | Admit: 2009-12-25 | Discharge: 2009-12-25 | Payer: Self-pay | Admitting: Emergency Medicine

## 2009-12-25 ENCOUNTER — Ambulatory Visit: Payer: Self-pay | Admitting: Radiology

## 2009-12-25 ENCOUNTER — Ambulatory Visit: Payer: Self-pay | Admitting: Family Medicine

## 2009-12-28 ENCOUNTER — Encounter: Payer: Self-pay | Admitting: Internal Medicine

## 2009-12-28 ENCOUNTER — Telehealth: Payer: Self-pay | Admitting: Internal Medicine

## 2010-01-04 ENCOUNTER — Telehealth: Payer: Self-pay | Admitting: Internal Medicine

## 2010-01-04 ENCOUNTER — Telehealth (INDEPENDENT_AMBULATORY_CARE_PROVIDER_SITE_OTHER): Payer: Self-pay | Admitting: *Deleted

## 2010-01-05 ENCOUNTER — Telehealth: Payer: Self-pay | Admitting: Internal Medicine

## 2010-01-12 ENCOUNTER — Ambulatory Visit (HOSPITAL_COMMUNITY): Payer: Self-pay | Admitting: Psychiatry

## 2010-01-16 ENCOUNTER — Telehealth: Payer: Self-pay | Admitting: Internal Medicine

## 2010-01-21 ENCOUNTER — Ambulatory Visit: Payer: Self-pay | Admitting: Internal Medicine

## 2010-01-21 LAB — CONVERTED CEMR LAB
BUN: 13 mg/dL (ref 6–23)
Blood Glucose, AC Bkfst: 97 mg/dL
CO2: 26 meq/L (ref 19–32)
Calcium: 9.5 mg/dL (ref 8.4–10.5)
Chloride: 102 meq/L (ref 96–112)
Creatinine, Ser: 0.77 mg/dL (ref 0.40–1.20)
Glucose, Bld: 98 mg/dL (ref 70–99)
Hgb A1c MFr Bld: 8 %
Potassium: 4.1 meq/L (ref 3.5–5.3)
Sodium: 141 meq/L (ref 135–145)

## 2010-01-22 ENCOUNTER — Telehealth: Payer: Self-pay | Admitting: Internal Medicine

## 2010-01-25 ENCOUNTER — Telehealth: Payer: Self-pay | Admitting: Internal Medicine

## 2010-01-27 ENCOUNTER — Encounter: Payer: Self-pay | Admitting: Internal Medicine

## 2010-02-04 ENCOUNTER — Telehealth: Payer: Self-pay | Admitting: Internal Medicine

## 2010-02-09 ENCOUNTER — Telehealth: Payer: Self-pay | Admitting: Internal Medicine

## 2010-02-18 ENCOUNTER — Emergency Department (HOSPITAL_COMMUNITY): Admission: EM | Admit: 2010-02-18 | Discharge: 2010-02-18 | Payer: Self-pay | Admitting: Emergency Medicine

## 2010-02-23 ENCOUNTER — Telehealth: Payer: Self-pay | Admitting: Internal Medicine

## 2010-02-25 ENCOUNTER — Ambulatory Visit: Payer: Self-pay | Admitting: Diagnostic Radiology

## 2010-02-25 ENCOUNTER — Emergency Department (HOSPITAL_BASED_OUTPATIENT_CLINIC_OR_DEPARTMENT_OTHER): Admission: EM | Admit: 2010-02-25 | Discharge: 2010-02-26 | Payer: Self-pay | Admitting: Emergency Medicine

## 2010-03-07 ENCOUNTER — Emergency Department (HOSPITAL_BASED_OUTPATIENT_CLINIC_OR_DEPARTMENT_OTHER): Admission: EM | Admit: 2010-03-07 | Discharge: 2010-03-07 | Payer: Self-pay | Admitting: Emergency Medicine

## 2010-03-08 ENCOUNTER — Encounter: Admission: RE | Admit: 2010-03-08 | Discharge: 2010-03-08 | Payer: Self-pay | Admitting: Emergency Medicine

## 2010-03-08 ENCOUNTER — Telehealth: Payer: Self-pay | Admitting: *Deleted

## 2010-03-12 ENCOUNTER — Ambulatory Visit (HOSPITAL_COMMUNITY): Payer: Self-pay | Admitting: Psychiatry

## 2010-03-13 ENCOUNTER — Emergency Department (HOSPITAL_BASED_OUTPATIENT_CLINIC_OR_DEPARTMENT_OTHER): Admission: EM | Admit: 2010-03-13 | Discharge: 2010-03-13 | Payer: Self-pay | Admitting: Emergency Medicine

## 2010-03-13 ENCOUNTER — Telehealth: Payer: Self-pay | Admitting: Internal Medicine

## 2010-03-19 ENCOUNTER — Ambulatory Visit: Payer: Self-pay

## 2010-03-22 ENCOUNTER — Emergency Department (HOSPITAL_COMMUNITY)
Admission: EM | Admit: 2010-03-22 | Discharge: 2010-03-22 | Payer: Self-pay | Source: Home / Self Care | Admitting: Emergency Medicine

## 2010-03-23 ENCOUNTER — Telehealth: Payer: Self-pay | Admitting: Internal Medicine

## 2010-03-24 ENCOUNTER — Encounter: Payer: Self-pay | Admitting: Family Medicine

## 2010-03-24 ENCOUNTER — Ambulatory Visit: Payer: Self-pay | Admitting: Internal Medicine

## 2010-03-24 ENCOUNTER — Encounter: Payer: Self-pay | Admitting: Licensed Clinical Social Worker

## 2010-03-24 ENCOUNTER — Ambulatory Visit: Payer: Self-pay | Admitting: Sports Medicine

## 2010-03-24 ENCOUNTER — Ambulatory Visit (HOSPITAL_COMMUNITY): Payer: Self-pay | Admitting: Psychiatry

## 2010-03-24 LAB — CONVERTED CEMR LAB
ALT: 10 units/L (ref 0–35)
AST: 10 units/L (ref 0–37)
Albumin: 3.9 g/dL (ref 3.5–5.2)
Alkaline Phosphatase: 72 units/L (ref 39–117)
Bilirubin Urine: NEGATIVE
Bilirubin, Direct: 0.1 mg/dL (ref 0.0–0.3)
Blood Glucose, AC Bkfst: 177 mg/dL
Creatinine, Urine: 142.8 mg/dL
Glucose, Urine, Semiquant: NEGATIVE
Hgb A1c MFr Bld: 8.3 %
Indirect Bilirubin: 0.2 mg/dL (ref 0.0–0.9)
Ketones, urine, test strip: NEGATIVE
Microalb Creat Ratio: 4.6 mg/g (ref 0.0–30.0)
Microalb, Ur: 0.65 mg/dL (ref 0.00–1.89)
Nitrite: NEGATIVE
Protein, U semiquant: 30
Specific Gravity, Urine: 1.025
Total Bilirubin: 0.3 mg/dL (ref 0.3–1.2)
Total Protein: 7.3 g/dL (ref 6.0–8.3)
Urobilinogen, UA: 0.2
pH: 5.5

## 2010-03-26 ENCOUNTER — Ambulatory Visit (HOSPITAL_COMMUNITY): Payer: Self-pay | Admitting: Psychiatry

## 2010-04-08 ENCOUNTER — Telehealth: Payer: Self-pay | Admitting: Internal Medicine

## 2010-04-09 ENCOUNTER — Ambulatory Visit (HOSPITAL_COMMUNITY): Admit: 2010-04-09 | Payer: Self-pay | Admitting: Psychology

## 2010-04-11 HISTORY — PX: TOTAL KNEE ARTHROPLASTY: SHX125

## 2010-04-12 ENCOUNTER — Encounter: Admission: RE | Admit: 2010-04-12 | Payer: Self-pay | Source: Home / Self Care | Admitting: Sports Medicine

## 2010-04-12 ENCOUNTER — Telehealth: Payer: Self-pay | Admitting: Internal Medicine

## 2010-04-13 ENCOUNTER — Ambulatory Visit: Admit: 2010-04-13 | Payer: Self-pay

## 2010-04-21 ENCOUNTER — Ambulatory Visit (HOSPITAL_COMMUNITY): Admit: 2010-04-21 | Payer: Self-pay | Admitting: Psychiatry

## 2010-04-21 ENCOUNTER — Emergency Department (HOSPITAL_COMMUNITY)
Admission: EM | Admit: 2010-04-21 | Discharge: 2010-04-21 | Payer: Self-pay | Source: Home / Self Care | Admitting: Emergency Medicine

## 2010-04-28 ENCOUNTER — Encounter: Payer: Self-pay | Admitting: Internal Medicine

## 2010-05-01 ENCOUNTER — Encounter: Payer: Self-pay | Admitting: Emergency Medicine

## 2010-05-01 ENCOUNTER — Encounter: Payer: Self-pay | Admitting: *Deleted

## 2010-05-02 ENCOUNTER — Encounter: Payer: Self-pay | Admitting: Internal Medicine

## 2010-05-02 ENCOUNTER — Encounter: Payer: Self-pay | Admitting: Sports Medicine

## 2010-05-03 ENCOUNTER — Encounter: Payer: Self-pay | Admitting: Sports Medicine

## 2010-05-06 ENCOUNTER — Ambulatory Visit: Admit: 2010-05-06 | Payer: Self-pay | Admitting: Internal Medicine

## 2010-05-06 ENCOUNTER — Ambulatory Visit: Admit: 2010-05-06 | Payer: Self-pay

## 2010-05-07 ENCOUNTER — Ambulatory Visit: Admit: 2010-05-07 | Payer: Self-pay | Admitting: Internal Medicine

## 2010-05-10 ENCOUNTER — Ambulatory Visit (HOSPITAL_COMMUNITY): Admit: 2010-05-10 | Payer: Self-pay | Admitting: Psychiatry

## 2010-05-11 NOTE — Progress Notes (Signed)
Summary: med refill/gp  Phone Note Refill Request Message from:  Fax from Pharmacy on April 11, 2007 11:41 AM  Refills Requested: Medication #1:  Sandria Senter ER 8-10 MG/5ML  LQCR Take 5ml by mouth q 12 hours as needed for cough.   Last Refilled: 02/23/2007 Initial call taken by: Chinita Pester RN,  April 11, 2007 11:41 AM  Follow-up for Phone Call        Denied.Does parient have cold symptoms, chronic cough that needs to be worked up?..she has had 2 refills in past- she is already on hydrocodone, she can actually purchase chlorpheniramine OTC for congestion. While on Vicodin(hydrocodone) no refills. Please recommend Robitussin Cough and Cold Syrup with Clorpheniramine in it. Thanks. Follow-up by: Julaine Fusi  DO,  April 11, 2007 1:39 PM  Additional Follow-up for Phone Call Additional follow up Details #1::        pt informed Additional Follow-up by: Merrie Roof RN,  April 11, 2007 3:45 PM

## 2010-05-11 NOTE — Progress Notes (Signed)
  Phone Note Call from Patient Call back at Home Phone [phone]   Caller: Patient Summary of Call: patient called wanting to schedule a prebariatric surgery nutrition visit. Recommend she be referred [facility] as this dietitian works closesly with bariatric center. Left patient a message with phone number to [facility] and that Iwould disucss this with Dr. Phillips Odor. Initial call taken by: Jamison Neighbor RD,CDE,  Sep 02, 2008 1:31 PM  Follow-up for Phone Call        Sounds like a good plan- I wasnt sure how specialized the nurtritional counseling would be -Thanks for your help! Follow-up by: Julaine Fusi  DO,  Sep 04, 2008 11:19 AM

## 2010-05-11 NOTE — Progress Notes (Signed)
Summary: refill/gg  Phone Note Refill Request  on November 19, 2009 5:42 PM  Pt request Rx for pen needles.......... I called pt and her insurance will now pay for solostar pens SO she will need needles.   Method Requested: Electronic Initial call taken by: Merrie Roof RN,  November 19, 2009 5:44 PM  Follow-up for Phone Call        scrpit sent Follow-up by: Julaine Fusi  DO,  November 26, 2009 9:27 AM    New/Updated Medications: LANTUS SOLOSTAR 100 UNIT/ML SOLN (INSULIN GLARGINE) inject 45 units subcutaneously at bedtime BD PEN NEEDLE SHORT U/F 31G X 8 MM MISC (INSULIN PEN NEEDLE) Use as directed once daily with Lantus solostar pen Prescriptions: BD PEN NEEDLE SHORT U/F 31G X 8 MM MISC (INSULIN PEN NEEDLE) Use as directed once daily with Lantus solostar pen  #100 x 6   Entered and Authorized by:   Julaine Fusi  DO   Signed by:   Julaine Fusi  DO on 11/26/2009   Method used:   Electronically to        [facility]  [address] [phone][facility] (retail)       [address]       [place], [place]  [id number]       Ph: [phone] or [phone]       Fax: [phone]   RxID:   [id number] LANTUS SOLOSTAR 100 UNIT/ML SOLN (INSULIN GLARGINE) inject 45 units subcutaneously at bedtime  #1 mo x 11   Entered and Authorized by:   Julaine Fusi  DO   Signed by:   Julaine Fusi  DO on 11/26/2009   Method used:   [facility]        [facility]  [address] [phone][facility] (retail)       [address]       [place], [place]  [id number]       Ph: [phone] or [phone]       Fax: [phone]   RxID:   [id number]

## 2010-05-11 NOTE — Letter (Signed)
Summary: [facility]  [facility]   Imported By: Florinda Marker 03/24/2008 16:40:32  _____________________________________________________________________  External Attachment:    Type:   Image     Comment:   External Document

## 2010-05-11 NOTE — Progress Notes (Signed)
Summary: Pain medication  Phone Note Call from Patient   Caller: Patient Summary of Call: Call from pt said that the medication given to her to relax her pain is not working.  Wants something else if possible. Initial call taken by: Angelina Ok RN,  January 25, 2010 10:42 AM  Follow-up for Phone Call        Patient called [phone] to follow up on benzo prescription. Pt states she was told she would be re-started on Valium, and that she had spoken to Dr. Phillips Odor regarding this. There is no indication of this from Dr. Lamar Blinks note, and thus I spoke with Dr. Phillips Odor, who informed me that patient is not to be started on Valium or any benzo. Patient was informed that she cannot be prescribed opiate and benzo pain meds after hours and for her to contact the clinic during working hours for further discussion regarding this. Patient states she had some Klonipin left and that she would take those until seen or spoken to by Dr. Phillips Odor. I also advised patient to come into the clinic if her anxiety symptoms are overwhelming. Furthermore, advised her to get plugged back in with [facility] who may be able to help her more with talk therapy as well as pharmacotherapy.  Follow-up by: Melida Quitter MD,  February 01, 2010 10:02 PM

## 2010-05-11 NOTE — Progress Notes (Signed)
Summary: Letter/forms  Phone Note Outgoing Call   Call placed by: Angelina Ok RN,  Sep 01, 2008 9:32 AM Call placed to: Patient Summary of Call: Attempts to call pt. no answer.  Unable to leave a message.  Pt has forms that are ready for pick up from Dr. Phillips Odor. Angelina Ok RN  Sep 01, 2008 9:33 AM  Initial call taken by: Angelina Ok RN,  Sep 01, 2008 9:33 AM

## 2010-05-11 NOTE — Assessment & Plan Note (Signed)
Summary: insulin instruction/dmr              CBG Device ID [employer] ReliOn   Current Allergies: ! DARVOCET    Risk Factors:  Tobacco use:  Never             Diabetes Self Management Training  PCP:  Vanetta Mulders MD Date diagnosed with diabetes: 04/11/2001 Diabetes Type: Type 2 insulin treated Current smoking Status: Never  Assessment Sources of Support: fiance Affect: Appropriate Readiness to learn:   Action  Long Acting  Insulin Type:Lantus  Bedtime Dose: 20 units to start anticipate she'll need at least 34 units based on weight   Currently using Insulin Pump? No  Monitoring Self monitoring blood glucose 1 time a day Measures urine ketones? No Name of Meter  [employer] ReliOn   [employer] for Low Blood sugar No    Time of Testing  Before Breakfast  Recent Episodes of: DKA: No Hyperglycemia : Yes Hypoglycemia: No HHNK: No Severe Hypoglycemia : No    Next foot exam due: 01/11/2007   Medications State name-action-dose-duration-side effects-and time to take medication: Demonstrates competency State appropriate timing of food related to medication: Demonstrates competency Demonstrates/verbalizes site selection and rotation for injections Demonstrates competency Correctly draw up and administer insulin-Byetta-Symlin-glucagon: Demonstrates competency State diabetes medication adjustments for sick days: Demonstrates competency State insulin adjustment guidelines: Demonstrates competency  Describe safe needle/lancet disposal: Regulatory affairs officer purpose and frequency of monitoring BG-ketones-HgbA1C and when to contact health care team with results: Demonstrates competency Perform glucose monitoring/ketone testing and record results correctly: Demonstrates competency State target blood glucose and HgbA1C goals: Demonstrates competency  Complications State the causes-signs and symptoms and prevention of Hyperglycemia:  Demonstrates competency Explain proper treatment of hyperglycemia: Demonstrates competency State the causes- signs and symptoms and prevention of hypoglycemia: Demonstrates competency Explain proper treatment of hypoglycemia: Demonstrates competency Date of Diabetic Education: 10/24/2006    BEHAVIORAL GOALS INITIAL Utilizing medications if for therapeutic effectiveness: inject 20 units lantus insulin tonight and increase by 1 unit every day until your fasting blood sugar is 90-130 Monitoring blood glucose levels daily: check blood sugar every morning and record on titration sheet        4 sample lantus solostar insulin pens provided today as pt has lost Medicaid and plans to obtain insulin through Medication assistance program  Pt instructed to call next Wednesday to report fasting CBgs and how self titration is going. She was also told she is responsible for keeping [place] aprised of her final Lantus insuln dose.

## 2010-05-11 NOTE — Progress Notes (Signed)
Summary: refill/ hla  Phone Note Refill Request Message from:  Patient on February 09, 2010 9:26 AM  Refills Requested: Medication #1:  diflucan 150mg    Dosage confirmed as above?Dosage Confirmed   Last Refilled: 11/2009 last visit 10/13  Initial call taken by: Marin Roberts RN,  February 09, 2010 9:26 AM  Follow-up for Phone Call        Refill approved-nurse to complete  New Problems: VAGINAL DISCHARGE (ICD-623.5)   New Problems: VAGINAL DISCHARGE (ICD-623.5) New/Updated Medications: DIFLUCAN 150 MG TABS (FLUCONAZOLE) Take 1 tab by mouth daily for 3 days. Prescriptions: DIFLUCAN 150 MG TABS (FLUCONAZOLE) Take 1 tab by mouth daily for 3 days.  #5 x 0   Entered by:   Vassie Loll MD   Authorized by:   Julaine Fusi  DO   Signed by:   Vassie Loll MD on 02/09/2010   Method used:   Electronically to        [facility] [address] [phone]* (retail)       [address]       [address]       [place], [place]  [address]       Ph: [phone]       Fax: [phone]   RxID:   [id number]    Impression & Recommendations:  Problem # 1:  VAGINAL DISCHARGE (ICD-623.5) Patient experiencing once again tick white discharge, itching and strog odor. As diabetic she is at risk for recurrent yeast infection. Will treat with Diflucan daily for 3 days; patient advised if symptoms worsen or failed to improved, to come to clinic right away...  Complete Medication List: 1)  Toprol Xl 200 Mg Tb24 (Metoprolol succinate) .... Take 1 tablet by mouth once a day 2)  Lotensin 40 Mg Tabs (Benazepril hcl) .Marland Kitchen.. 1 by mouth daily 3)  Glucophage 1000 Mg Tabs (Metformin hcl) .Marland Kitchen.. 1 by mouth two times a day 4)  Glucotrol 5 Mg Tabs (Glipizide) .... Take 1 tablet by mouth two times a day 5)  Lantus Solostar 100 Unit/ml Soln (Insulin glargine) .... Inject 45 units subcutaneously at bedtime 6)  Protonix 40 Mg Tbec (Pantoprazole sodium) .... Take 1 tablet by mouth once a day 7)  Vitamin D3 1000 Unit Caps  (Cholecalciferol) .... Take 1 tablet by mouth once a day 8)  Hydrochlorothiazide 25 Mg Tabs (Hydrochlorothiazide) .... Take 1 tablet by mouth every morning 9)  Gabapentin 800 Mg Tabs (Gabapentin) .... Take 1 tablet by mouth two times a day 10)  Colcrys 0.6 Mg Tabs (Colchicine) .... Take one tab by mouth two times a day for 5 days, then 1 tab daily. 11)  Bd Pen Needle Short U/f 31g X 8 Mm Misc (Insulin pen needle) .... Use as directed once daily with lantus solostar pen 12)  Senokot S 8.6-50 Mg Tabs (Sennosides-docusate sodium) .... Take 1 tablet by mouth two times a day as needed 13)  Chlordiazepoxide Hcl 10 Mg Caps (Chlordiazepoxide hcl) .... Tke one tab by mouth once daily for muscle spasm 14)  Diflucan 150 Mg Tabs (Fluconazole) .... Take 1 tab by mouth daily for 3 days.
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
## 2010-05-11 NOTE — Progress Notes (Signed)
Summary: refill/ hla  Phone Note Refill Request Message from:  Patient on February 18, 2008 3:17 PM  Refills Requested: Medication #1:  ALPRAZOLAM 1 MG  TB24 Take 1 tablet by mouth once a day at bedtime pt wants to take 3 times daily, her son is being shipped to [place](sp) in 2 weeks and she has already lost 1 son in the war  Initial call taken by: Marin Roberts RN,  February 18, 2008 3:17 PM  Follow-up for Phone Call        please have her come in to see me. Thanks. Also-- is it possible to get a reports printed?  Follow-up by: Julaine Fusi  DO,  February 20, 2008 2:50 PM  Additional Follow-up for Phone Call Additional follow up Details #1::        Appt w/ Patient- patient will need to schedule coiunselling appointment for continued prescribing of benzos. Additional Follow-up by: Julaine Fusi  DO,  February 25, 2008 3:32 PM

## 2010-05-11 NOTE — Miscellaneous (Signed)
  Clinical Lists Changes Called in 7 day course of abx and a decongestant for acute sinusitis- recurrent. Medications: Removed medication of TUSSIONEX PENNKINETIC ER 8-10 MG/5ML  LQCR (CHLORPHENIRAMINE-HYDROCODONE) Take 5ml by mouth q 12 hours as needed for cough. - Signed Removed medication of BACTRIM DS 800-160 MG  TABS (SULFAMETHOXAZOLE-TRIMETHOPRIM) Take 1 tablet by mouth two times a day for 3 days - Signed Removed medication of PROMETHAZINE HCL 12.5 MG TABS (PROMETHAZINE HCL) Take one tablet every 8 hours as needed for nausea - Signed Added new medication of MUCINEX D [phone] MG XR12H-TAB (PSEUDOEPHEDRINE-GUAIFENESIN) Take 1 tablet by mouth two times a day as needed for congestion - Signed Added new medication of CEFTIN 250 MG TABS (CEFUROXIME AXETIL) Take 1 tablet by mouth two times a day - Signed Rx of MUCINEX D [phone] MG XR12H-TAB (PSEUDOEPHEDRINE-GUAIFENESIN) Take 1 tablet by mouth two times a day as needed for congestion;  #20 x 0;  Signed;  Entered by: Julaine Fusi  DO;  Authorized by: Julaine Fusi  DO;  Method used: [facility] to [facility] [phone]*, [address] [place], [place], [place]  [address], Ph: [phone] or [phone], Fax: [phone] Rx of CEFTIN 250 MG TABS (CEFUROXIME AXETIL) Take 1 tablet by mouth two times a day;  #14 x 0;  Signed;  Entered by: Julaine Fusi  DO;  Authorized by: Julaine Fusi  DO;  Method used: Electronically to [facility] [phone]*, [address] [place], [place], [place]  [address], Ph: [phone] or [phone], Fax: [phone]    Prescriptions: CEFTIN 250 MG TABS (CEFUROXIME AXETIL) Take 1 tablet by mouth two times a day  #14 x 0   Entered and Authorized by:   Julaine Fusi  DO   Signed by:   Julaine Fusi  DO on 05/21/2008   Method used:   [facility]        [facility] [phone]* (retail)       [address]       [place], [place]  [address]       Ph: [phone] or [phone]       Fax: [phone]   RxID:   [id number] MUCINEX D [phone] MG XR12H-TAB (PSEUDOEPHEDRINE-GUAIFENESIN) Take 1 tablet by mouth two times a day as needed for congestion  #20 x 0   Entered and Authorized by:   Julaine Fusi  DO   Signed by:   Julaine Fusi  DO on 05/21/2008   Method used:   [facility]ronically to        [facility] [address] [phone]* (retail)       [address]       [place], [place]  [address]       Ph: [phone] or [phone]       Fax: [phone]   RxID:   [id number]  [facility] Merrie Roof RN  May 21, 2008 12:36 PM

## 2010-05-11 NOTE — Progress Notes (Signed)
Summary: med refill/gp  Phone Note Refill Request Message from:  Fax from Pharmacy on April 03, 2007 11:51 AM  Refills Requested: Medication #1:  ALPRAZOLAM 1 MG  TB24 Take 1 tablet by mouth once a day at bedtime   Last Refilled: 03/12/2007 Initial call taken by: Chinita Pester RN,  April 03, 2007 11:51 AM  Follow-up for Phone Call        Approved. May call in script with 2 refills- can be filled Dec. 29th since last script was filled on the first and she was given 30 tabs. Thanks. Follow-up by: Julaine Fusi  DO,  April 03, 2007 2:27 PM  Additional Follow-up for Phone Call Additional follow up Details #1::        Rx called to pharmacy Additional Follow-up by: Angelina Ok RN,  April 03, 2007 5:53 PM

## 2010-05-11 NOTE — Progress Notes (Signed)
Summary: med refill/gp  Phone Note Refill Request Message from:  Patient on January 23, 2008 11:44 AM  Refills Requested: Medication #1:  VICODIN ES 7.5-750 MG TABS Take 1 tablet by mouth every 6 hours as needed for pain   Last Refilled: 12/24/2007  Medication #2:  ATROVENT 0.06 %  SOLN 2 puffs in each nostril up to three times a day as needed for nasal congestion. New pharmacy- [facility] [address]   Method Requested: Telephone to Pharmacy Initial call taken by: Chinita Pester RN,  January 23, 2008 11:44 AM  Follow-up for Phone Call        Refill approved-nurse to complete- may call in Follow-up by: Julaine Fusi  DO,  January 27, 2008 10:49 PM  Additional Follow-up for Phone Call Additional follow up Details #1::        Rx called to pharmacy Additional Follow-up by: Angelina Ok RN,  January 28, 2008 11:20 AM      Prescriptions: ATROVENT 0.06 %  SOLN (IPRATROPIUM BROMIDE) 2 puffs in each nostril up to three times a day as needed for nasal congestion.  #31ml x 3   Entered and Authorized by:   Julaine Fusi  DO   Signed by:   Julaine Fusi  DO on 01/27/2008   Method used:   Electronically to        [facility] [address] [id number]* (retail)       [address] [place], [place]  [address]       Ph: [phone]       Fax: [phone]   RxID:   [id number] VICODIN ES 7.5-750 MG TABS (HYDROCODONE-ACETAMINOPHEN) Take 1 tablet by mouth every 6 hours as needed for pain  #120 x 3   Entered and Authorized by:   Julaine Fusi  DO   Signed by:   Julaine Fusi  DO on 01/27/2008   Method used:   Telephoned to ...       [facility] [address] [id number]* (retail)       [address] [place], [place]  [address]       Ph: [phone]       Fax: [phone]   RxID:   [id number]

## 2010-05-11 NOTE — Miscellaneous (Signed)
Summary: [facility] : Record  [facility] : Record   Imported By: Florinda Marker 12/08/2008 14:19:19  _____________________________________________________________________  External Attachment:    Type:   Image     Comment:   External Document

## 2010-05-11 NOTE — Progress Notes (Signed)
Summary: Soc. Work  Nurse, children's placed by: Soc. Work Emergency planning/management officer of Call: Call to [employer] at [employer] to find out about possible housing.   [phone]   [phone]  Follow-up for Phone Call        Got in contact with larry at [facility] who said that he did not have patient on wait list and she is welcome to apply again.  will contact patient to let her know. Maria [name]  October 05, 2009 10:29 AM   Additional Follow-up for Phone Call Additional follow up Details #1::        Left message for patient to call social work and to reapply at [employer].      Additional Follow-up for Phone Call Additional follow up Details #2::    Left message for patient to call me. Maria [name]  October 06, 2009 12:28 PM    Appended Document: Soc. Work Discussed case with Dr. Phillips Odor who advises not to give this patient any more funds and questions her story.

## 2010-05-11 NOTE — Progress Notes (Signed)
Summary: Refill/gh  Phone Note Refill Request Message from:  Pharmacy on September 26, 2007 2:45 PM  Refills Requested: Medication #1:  ALPRAZOLAM 1 MG  TB24 Take 1 tablet by mouth once a day at bedtime  Medication #2:  LANTUS SOLOSTAR 100 UNIT/ML  SOLN 34 units at bedtime daily Initial call taken by: Angelina Ok RN,  September 26, 2007 2:46 PM  Follow-up for Phone Call        Refill approved-nurse to complete Follow-up by: Julaine Fusi  DO,  September 27, 2007 5:46 AM  Additional Follow-up for Phone Call Additional follow up Details #1::        Call to [facility] pt just got # 30 of Xanax on 09/08/2007.  this is early.  Pharmacy will not do.  Did you want this held?  Additional Follow-up by: Angelina Ok RN,  September 27, 2007 9:08 AM    Additional Follow-up for Phone Call Additional follow up Details #2::    hold until due Follow-up by: Julaine Fusi  DO,  September 28, 2007 4:12 AM  Additional Follow-up for Phone Call Additional follow up Details #3:: Details for Additional Follow-up Action Taken: This script is due now please call in- thanks! -bg Additional Follow-up by: Julaine Fusi  DO,  October 11, 2007 8:56 AM    Prescriptions: LANTUS SOLOSTAR 100 UNIT/ML  SOLN (INSULIN GLARGINE) 34 units at bedtime daily  #1 mo x 12   Entered and Authorized by:   Julaine Fusi  DO   Signed by:   Julaine Fusi  DO on 09/27/2007   Method used:   Telephoned to ...       [facility]       [address] [place], [place]  [address]       Ph: [phone]       Fax: [phone]   RxID:   [id number] ALPRAZOLAM 1 MG  TB24 (ALPRAZOLAM) Take 1 tablet by mouth once a day at bedtime  #30 x 3   Entered and Authorized by:   Julaine Fusi  DO   Signed by:   Julaine Fusi  DO on 09/27/2007   Method used:   Telephoned to ...       [facility]       [address] [place], [place]  [address]       Ph: [phone]       Fax: [phone]   RxID:    [id number]

## 2010-05-11 NOTE — Progress Notes (Signed)
Summary: Medcation for UTI  Phone Note Outgoing Call   Call placed by: Angelina Ok RN,  April 18, 2007 2:17 PM Call placed to: Patient Summary of Call: Spoke with pt today, said that she did get medication for UTI at the pharmacy.  Has completed dosesand still has some frequency. Pt was given option to come in for another apppointment to recheck urine.  Patched to [place] to set up an appointment with Dr. Phillips Odor if possible.  Pt also said that she had other things she would like to discuss with the doctor as well.  ..................................................................Marland KitchenAngelina Ok RN  April 18, 2007 2:20 PM  Initial call taken by: Angelina Ok RN,  April 18, 2007 2:24 PM

## 2010-05-11 NOTE — Miscellaneous (Signed)
Summary: [facility]abetic [facility]  [facility]   Imported By: Florinda Marker 11/13/2008 14:11:06  _____________________________________________________________________  External Attachment:    Type:   Image     Comment:   External Document

## 2010-05-11 NOTE — Assessment & Plan Note (Signed)
Summary: anxiety/gg   Vital Signs:  Patient profile:   50 year old female Height:      67.5 inches (171.45 cm) Weight:      370 pounds (168.18 kg) BMI:     57.30 Temp:     97.3 degrees F (36.28 degrees C) oral Pulse rate:   99 / minute BP sitting:   190 / 106  (right arm) Cuff size:   large  Vitals Entered By: Theotis Barrio NT II (March 16, 2009 2:30 PM) CC: ANXIETY  / DM    Is Patient Diabetic? Yes Did you bring your meter with you today? No Pain Assessment Patient in pain? yes     Location: R KNEE Intensity:      9 Type: ALL Onset of pain  CHRONIC  Nutritional Status BMI of > 30 = obese  Have you ever been in a relationship where you felt threatened, hurt or afraid?No   Does patient need assistance? Functional Status Self care Ambulation Impaired:Risk for fall Comments ANXIETY  / DM   / PAIN IN RIGHT KNEE   Primary Care Provider:  Julaine Fusi  DO  CC:  ANXIETY  / DM   .  History of Present Illness: 50 years old female with Past Medical History: Hypertension Diabetes mellitus, type II Hyperlipidemia Obesity h/o dysfunctional uterine bleeding allergic rhinitis s/p tubal lugation Anxiety h/o chest pain w neg cardiolite in 2005 kidney stones with extraction 2009  and anxiety disorder presents with acute worsening of her anxiety attacks during festival seson. She is having tremors, bad dreams, insomnia, suicidal ideation (during thanksgiving, now resolved), jitteriness, palpitation, feeling like she is having heart attack, shortness of breath with rapid breathing over last few weeks. Course is progressive.  She was not prescribed xanax by Dr. Phillips Odor after finding out that benzo were absent on her urine screen. She claims that they were stolen by her boyfriend who now is incarcerated. Her son died in [place] two years ago. another daughter is incarcerated. All of these contributes to her anxiety attack especially during festive season.   Preventive  Screening-Counseling & Management  Alcohol-Tobacco     Alcohol drinks/day: 0     Smoking Status: never  Caffeine-Diet-Exercise     Does Patient Exercise: no  Current Medications (verified): 1)  Toprol Xl 200 Mg Tb24 (Metoprolol Succinate) .... Take 1 Tablet By Mouth Once A Day 2)  Lotensin 40 Mg Tabs (Benazepril Hcl) .Marland Kitchen.. 1 By Mouth Daily 3)  Glucophage 1000 Mg Tabs (Metformin Hcl) .Marland Kitchen.. 1 By Mouth Two Times A Day 4)  Glucotrol 5 Mg Tabs (Glipizide) .... Take 1 Tablet By Mouth Two Times A Day 5)  Lantus 100 Unit/ml Soln (Insulin Glargine) .... Inject 45 Units At Bedtime 6)  Protonix 40 Mg  Tbec (Pantoprazole Sodium) .... Take 1 Tablet By Mouth Once A Day 7)  Vitamin D3 1000 Unit Caps (Cholecalciferol) .... Take 1 Tablet By Mouth Once A Day 8)  Gabapentin 100 Mg Caps (Gabapentin) .... Three Pills By Mouth Twice A Day. 9)  Tb Syringe 1 Ml  Misc (Needles & Syringes) .... Use As Directed 10)  Lasix 40 Mg Tabs (Furosemide) .... Take 1 Tablet By Mouth Once A Day 11)  Carisoprodol 350 Mg Tabs (Carisoprodol) .... One By Mouth Three Times A Day 12)  Walker .... Use As Directed  Dx: Gait Instability, Fall 13)  Lidoderm 5 % Ptch (Lidocaine) .... Apply Patch To Affected Knee For Up To 12 Hours A Day.  14)  Diflucan 150 Mg Tabs (Fluconazole) .... Take 1 Tablet By Mouth Once A Day 15)  Veramyst 27.5 Mcg/spray Susp (Fluticasone Furoate) .Marland Kitchen.. 1 Spray To Each Nostril Once A Day. 16)  Fluticasone Propionate 50 Mcg/act Susp (Fluticasone Propionate) .... 2 Puffs Iin Both Nares Q Day  Allergies (verified): 1)  ! Darvocet  Past History:  Past Medical History: Last updated: 02/25/2008 Hypertension Diabetes mellitus, type II Hyperlipidemia Obesity h/o dysfunctional uterine bleeding allergic rhinitis s/p tubal lugation Anxiety h/o chest pain w neg cardiolite in 2005 kidney stones with extraction 2009  Family History: Last updated: 03/20/2007 1st degree relatives  with: Obesity Diabetes Hypertension CAD  Social History: Last updated: 03/20/2007 Married. No ETOH. No Tobacco. No Drugs.  Risk Factors: Alcohol Use: 0 (03/16/2009) Exercise: no (03/16/2009)  Risk Factors: Smoking Status: never (03/16/2009)  Review of Systems      See HPI  Physical Exam  General:  overweight-appearing, diaphoretic, and moderate distress.   Head:  atraumatic.   Eyes:  pupils equal, pupils round, and pupils reactive to light.   Ears:  no external deformities.   Nose:  no external erythema.   Mouth:  dry oral mucosa Neck:  No deformities, masses, or tenderness noted. Lungs:  Normal respiratory effort, chest expands symmetrically. Lungs are clear to auscultation, no crackles or wheezes. Heart:  tachycardia and regular rhythm. S1 and S2 normal without gallop, murmur, click, rub or other extra sounds. Abdomen:  Bowel sounds positive,abdomen soft and non-tender without masses, organomegaly or hernias noted. Neurologic:  No cranial nerve deficits noted. Station and gait are normal. Plantar reflexes are down-going bilaterally. DTRs are symmetrical throughout. Sensory, motor and coordinative functions appear intact. Skin:  Intact without suspicious lesions or rashes Psych:  tearful, severely anxious, hyperactive, and agitated.     Impression & Recommendations:  Problem # 1:  VAGINAL DISCHARGE (ICD-623.5) thick white discharge, chronic, will give diflucan. Pt needs gyn refferral with recurrent fungal infections and dysfunctional uterine bleeding.  Orders: Gynecologic Referral (Gyn)  Problem # 2:  ANXIETY STATE NOS (ICD-300.00) Assessment: Deteriorated Uncontrolled anxiety attacks. Withdrawn from Benzos for 8-10 weeks. Given her presentation, suicidial ideation in recent past, I will give her trial of zoloft. Meanwhile, I will give her alprazolam to bridge. I would also refer the plan to PCP and make sure she approves of it.   Pt did say that zoloft did not  work for her, but I dont see any past prescription in out system.  Her updated medication list for this problem includes:    Zoloft 100 Mg Tabs (Sertraline hcl) .Marland Kitchen... Take 1/2 tablet every day for one week. increase to one tablet per day thereafter.    Alprazolam 1 Mg Tabs (Alprazolam) .Marland Kitchen... Take one tablet every 12 hours as needed.  Problem # 3:  GERD (ICD-530.81) stable. continued on protonix.  Her updated medication list for this problem includes:    Protonix 40 Mg Tbec (Pantoprazole sodium) .Marland Kitchen... Take 1 tablet by mouth once a day  Problem # 4:  DIABETES MELLITUS, TYPE II (ICD-250.00) Did not change DM management.  Her updated medication list for this problem includes:    Lotensin 40 Mg Tabs (Benazepril hcl) .Marland Kitchen... 1 by mouth daily    Glucophage 1000 Mg Tabs (Metformin hcl) .Marland Kitchen... 1 by mouth two times a day    Glucotrol 5 Mg Tabs (Glipizide) .Marland Kitchen... Take 1 tablet by mouth two times a day    Lantus 100 Unit/ml Soln (Insulin glargine) ..... Inject 45 units  at bedtime  Labs Reviewed: Creat: 1.02 (08/26/2008)    Reviewed HgBA1c results: 7.6 (02/03/2009)  8.1 (09/24/2008)  Problem # 5:  HYPERTENSION (ICD-401.9) BP highly elevated likely 2* to her anxiety state. No changes made today. Follow up in two weeks for further management.  Her updated medication list for this problem includes:    Toprol Xl 200 Mg Tb24 (Metoprolol succinate) .Marland Kitchen... Take 1 tablet by mouth once a day    Lotensin 40 Mg Tabs (Benazepril hcl) .Marland Kitchen... 1 by mouth daily    Lasix 40 Mg Tabs (Furosemide) .Marland Kitchen... Take 1 tablet by mouth once a day  BP today: 190/106 Prior BP: 143/92 (02/03/2009)  Labs Reviewed: K+: 4.5 (08/26/2008) Creat: : 1.02 (08/26/2008)   Chol: 159 (08/26/2008)   HDL: 67 (08/26/2008)   LDL: 78 (08/26/2008)   TG: 70 (08/26/2008)  Problem # 6:  DYSFUNCTIONAL UTERINE BLEEDING (ICD-626.8) occassional spoting. Could be getting towards menopause. Will refer to gyn for further work up.  Orders: Gynecologic  Referral (Gyn)  Complete Medication List: 1)  Toprol Xl 200 Mg Tb24 (Metoprolol succinate) .... Take 1 tablet by mouth once a day 2)  Lotensin 40 Mg Tabs (Benazepril hcl) .Marland Kitchen.. 1 by mouth daily 3)  Glucophage 1000 Mg Tabs (Metformin hcl) .Marland Kitchen.. 1 by mouth two times a day 4)  Glucotrol 5 Mg Tabs (Glipizide) .... Take 1 tablet by mouth two times a day 5)  Lantus 100 Unit/ml Soln (Insulin glargine) .... Inject 45 units at bedtime 6)  Protonix 40 Mg Tbec (Pantoprazole sodium) .... Take 1 tablet by mouth once a day 7)  Vitamin D3 1000 Unit Caps (Cholecalciferol) .... Take 1 tablet by mouth once a day 8)  Gabapentin 100 Mg Caps (Gabapentin) .... Three pills by mouth twice a day. 9)  Tb Syringe 1 Ml Misc (Needles & syringes) .... Use as directed 10)  Lasix 40 Mg Tabs (Furosemide) .... Take 1 tablet by mouth once a day 11)  Carisoprodol 350 Mg Tabs (Carisoprodol) .... One by mouth three times a day 12)  Walker  .... Use as directed  dx: gait instability, fall 13)  Lidoderm 5 % Ptch (Lidocaine) .... Apply patch to affected knee for up to 12 hours a day. 14)  Diflucan 150 Mg Tabs (Fluconazole) .... Take 1 tablet by mouth once a day 15)  Veramyst 27.5 Mcg/spray Susp (Fluticasone furoate) .Marland Kitchen.. 1 spray to each nostril once a day. 16)  Fluticasone Propionate 50 Mcg/act Susp (Fluticasone propionate) .... 2 puffs iin both nares q day 17)  Zoloft 100 Mg Tabs (Sertraline hcl) .... Take 1/2 tablet every day for one week. increase to one tablet per day thereafter. 18)  Alprazolam 1 Mg Tabs (Alprazolam) .... Take one tablet every 12 hours as needed.  Patient Instructions: 1)  Please schedule a follow-up appointment in 2 weeks. 2)  If you experience suicidal ideation call clinic or emergency services immediately.  3)  You are started on benzodiazepine group of drugs, which can cause drowsiness. Do not drive while taking this medications.  Prescriptions: WALKER use as directed  dx: gait instability, fall  #1 x 0    Entered and Authorized by:   Clerance Lav MD   Signed by:   Clerance Lav MD on 03/16/2009   Method used:   Print then Give to Patient   RxID:   [id number] DIFLUCAN 150 MG TABS (FLUCONAZOLE) Take 1 tablet by mouth once a day  #2 x 0   Entered and Authorized by:  Clerance Lav MD   Signed by:   Clerance Lav MD on 03/16/2009   Method used:   Print then Give to Patient   RxID:   [id number] ALPRAZOLAM 1 MG TABS (ALPRAZOLAM) Take one tablet every 12 hours as needed.  #28 x 0   Entered and Authorized by:   Clerance Lav MD   Signed by:   Clerance Lav MD on 03/16/2009   Method used:   Print then Give to Patient   RxID:   [id number] ZOLOFT 100 MG TABS (SERTRALINE HCL) Take 1/2 tablet every day for one week. Increase to one tablet per day thereafter.  #30 x 0   Entered and Authorized by:   Clerance Lav MD   Signed by:   Clerance Lav MD on 03/16/2009   Method used:   Print then Give to Patient   RxID:   [id number]    Prevention & Chronic Care Immunizations   Influenza vaccine: Fluvax MCR  (02/03/2009)   Influenza vaccine due: 12/10/2008    Tetanus booster: Not documented   Td booster deferral: Deferred  (09/24/2008)    Pneumococcal vaccine: Not documented  Other Screening   Pap smear: Normal  (01/04/2006)   Pap smear action/deferral: Deferred-2 yr interval  (09/24/2008)    Mammogram: Normal  (01/04/2006)   Mammogram action/deferral: Ordered  (02/03/2009)   Smoking status: never  (03/16/2009)  Diabetes Mellitus   HgbA1C: 7.6  (02/03/2009)   Hemoglobin A1C due: 12/25/2008    Eye exam: Not documented   Diabetic eye exam action/deferral: Ophthalmology referral  (02/03/2009)    Foot exam: yes  (02/03/2009)   Foot exam action/deferral: Do today   High risk foot: Not documented   Foot care education: Done  (02/03/2009)   Foot exam due: 01/11/2007    Urine microalbumin/creatinine ratio: 7.4  (02/25/2008)  Lipids   Total Cholesterol: 159   (08/26/2008)   LDL: 78  (08/26/2008)   LDL Direct: Not documented   HDL: 67  (08/26/2008)   Triglycerides: 70  (08/26/2008)   Lipid panel due: 05/10/2006    SGOT (AST): 11  (08/26/2008)   SGPT (ALT): 9  (08/26/2008)   Alkaline phosphatase: 88  (08/26/2008)   Total bilirubin: 0.4  (08/26/2008)  Hypertension   Last Blood Pressure: 190 / 106  (03/16/2009)   Serum creatinine: 1.02  (08/26/2008)   Serum potassium 4.5  (08/26/2008)  Self-Management Support :   Personal Goals (by the next clinic visit) :     Personal A1C goal: 7  (09/24/2008)     Personal blood pressure goal: 130/80  (09/24/2008)     Personal LDL goal: 70  (09/24/2008)    Patient will work on the following items until the next clinic visit to reach self-care goals:     Medications and monitoring: take my medicines every day, check my blood sugar, bring all of my medications to every visit, examine my feet every day  (03/16/2009)     Eating: drink diet soda or water instead of juice or soda, eat more vegetables, use fresh or frozen vegetables, eat foods that are low in salt, eat baked foods instead of fried foods, eat fruit for snacks and desserts, limit or avoid alcohol  (03/16/2009)     Other: has weight surgery request for [place]-going to PT - may try water therapy  (12/04/2008)    Diabetes self-management support: Written self-care plan, Referred for DM self-management training  (09/24/2008)    Hypertension self-management support: Written self-care plan  (09/24/2008)  Lipid self-management support: Written self-care plan  (09/24/2008)

## 2010-05-11 NOTE — Assessment & Plan Note (Signed)
Summary: L KNEE PAIN,MC   Vital Signs:  Patient profile:   50 year old female BP sitting:   150 / 98  Vitals Entered By: Lillia Pauls CMA (December 11, 2009 9:21 AM)   Primary Care Provider:  Julaine Fusi  DO  CC:  Left knee pain.  History of Present Illness: 1. Left knee pain:   - Has had chronic problems with this knee and had arthroscopic knee surgery about 1 year ago for a torn meniscus - She presents to clinic today because of left knee pain for 1 month - Pain is rated a 10/10 - It is located in the anterior / medial part of her left knee - She is not sure if she twisted it or injured it - It is not getting better - Endorses that it has been giving out on her and she has falllen.  Denies it locking or catching. - Oxycodone 10mg  makes it better  ROS: denies ankle pain.  endorses right knee pain   Current Medications (verified): 1)  Toprol Xl 200 Mg Tb24 (Metoprolol Succinate) .... Take 1 Tablet By Mouth Once A Day 2)  Lotensin 40 Mg Tabs (Benazepril Hcl) .Marland Kitchen.. 1 By Mouth Daily 3)  Glucophage 1000 Mg Tabs (Metformin Hcl) .Marland Kitchen.. 1 By Mouth Two Times A Day 4)  Glucotrol 5 Mg Tabs (Glipizide) .... Take 1 Tablet By Mouth Two Times A Day 5)  Lantus Solostar 100 Unit/ml Soln (Insulin Glargine) .... Inject 45 Units Subcutaneously At Bedtime 6)  Protonix 40 Mg  Tbec (Pantoprazole Sodium) .... Take 1 Tablet By Mouth Once A Day 7)  Vitamin D3 1000 Unit Caps (Cholecalciferol) .... Take 1 Tablet By Mouth Once A Day 8)  Tb Syringe 1 Ml  Misc (Needles & Syringes) .... Use As Directed 9)  Carisoprodol 350 Mg Tabs (Carisoprodol) .... One By Mouth Three Times A Day 10)  Hydrochlorothiazide 25 Mg Tabs (Hydrochlorothiazide) .... Take 1 Tablet By Mouth Every Morning 11)  Fluoxetine Hcl 40 Mg Caps (Fluoxetine Hcl) .... Take 1 Tablet By Mouth Once A Day 12)  Gabapentin 300 Mg Caps (Gabapentin) .... Take 1 Tablet By Mouth Three Times A Day 13)  Dibucaine 1 % Oint (Dibucaine) .... Apply To  Hemorrhoid Up To 4 Times Daily As Needed 14)  Trazodone Hcl 150 Mg Tabs (Trazodone Hcl) .... Start One Tab By Mouth At Bedtime For 3 Days Then Increase To 2 Tablets Po At Bedtime 15)  Indomethacin 50 Mg Caps (Indomethacin) .... Take 1 Tab Bye Mouth Every 12hrs As Needed For Pain. 16)  Colcrys 0.6 Mg Tabs (Colchicine) .... Take One Tab By Mouth Two Times A Day For 5 Days, Then 1 Tab Daily. 17)  Bd Pen Needle Short U/f 31g X 8 Mm Misc (Insulin Pen Needle) .... Use As Directed Once Daily With Lantus Solostar Pen 18)  Ibuprofen 800 Mg Tabs (Ibuprofen) .Marland Kitchen.. 1 By Mouth Two Times A Day As Needed Knee Pain (Do Not Take With Indomethacin)  Allergies: 1)  ! Darvocet  Past History:  Past Medical History: Reviewed history from 02/25/2008 and no changes required. Hypertension Diabetes mellitus, type II Hyperlipidemia Obesity h/o dysfunctional uterine bleeding allergic rhinitis s/p tubal lugation Anxiety h/o chest pain w neg cardiolite in 2005 kidney stones with extraction 2009  Physical Exam  General:  Vitals reviewed.  Hypertensive.  No acute distress. Lungs:  normal respiratory effort.   Heart:  normal rate, regular rhythm, and no murmur.   Msk:  Left knee:  no obvious effusion.  Difficult to appreciate landmarks given obesity.  TTP in anterior-medial joint line.  Full knee extension but painful.  Knee flexion limited to 120 degrees because of pain.  Good stability.  Negative McMurrays.  Negative Anterior / Posterior drawer.  Left hip:  good ROM  Right knee:  no obvious effusion.  Difficult to appreciate landmarks given obesity.  Joint line tenderness.  Right hip:  good ROM Additional Exam:  Patient given informed consent for injection. Discussed possible complications of infection, bleeding or skin atrophy at site of injection. Possible side effect of avascular necrosis (focal area of bone death) due to steroid use.Appropriate verbal time out taken Are cleaned and prepped in usual sterile  fashion. A --1-- cc kennalog plus -4---cc 1% lidocaine without epinephrine was injected into the--left knee using anterior approach. Patient tolerated procedure well with no complications.    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 Take 1 tablet by mouth once a day 2)  Lotensin 40 Mg Tabs (Benazepril hcl) .Marland Kitchen.. 1 by mouth daily 3)  Glucophage 1000 Mg Tabs (Metformin hcl) .Marland Kitchen.. 1 by mouth two times a day 4)  Glucotrol 5 Mg Tabs (Glipizide) .... Take 1 tablet by mouth two times a day 5)  Lantus Solostar 100 Unit/ml Soln (Insulin glargine) .... Inject 45 units subcutaneously at bedtime 6)  Protonix 40 Mg Tbec (Pantoprazole sodium) .... Take 1 tablet by mouth once a day 7)  Vitamin D3 1000 Unit Caps (Cholecalciferol) .... Take 1 tablet by mouth once a day 8)  Tb Syringe 1 Ml Misc (Needles & syringes) .... Use as directed 9)  Carisoprodol 350 Mg Tabs (Carisoprodol) .... One by mouth three times a day 10)  Hydrochlorothiazide 25 Mg Tabs (Hydrochlorothiazide) .... Take 1 tablet by mouth every morning 11)  Fluoxetine Hcl 40 Mg Caps (Fluoxetine hcl) .... Take 1 tablet by mouth once a day 12)  Gabapentin 300 Mg Caps (Gabapentin) .... Take 1 tablet by mouth three times a day 13)  Dibucaine 1 % Oint (Dibucaine) .... Apply to hemorrhoid up to 4 times daily as needed 14)  Trazodone Hcl 150 Mg Tabs (Trazodone hcl) .... Start one tab by mouth at bedtime for 3 days then increase to 2 tablets po at bedtime 15)  Indomethacin 50 Mg Caps (Indomethacin) .... Take 1 tab bye mouth every 12hrs as needed for pain. 16)  Colcrys 0.6 Mg Tabs (Colchicine) .... Take one tab by mouth two times a day for 5 days, then 1 tab daily. 17)  Bd Pen Needle  Short U/f 31g X 8 Mm Misc (Insulin pen needle) .... Use as directed once daily with lantus solostar pen 18)  Ibuprofen 800 Mg Tabs (Ibuprofen) .Marland Kitchen.. 1 by mouth two times a day as needed knee pain (do not take with indomethacin) Prescriptions: IBUPROFEN 800 MG TABS (IBUPROFEN) 1 by mouth two times a day as needed knee pain (do not take with indomethacin)  #60 x 5   Entered and Authorized by:   Denny Levy MD   Signed by:   Denny Levy MD on 12/11/2009   Method used:   [facility]        [facility] [address] [phone] ([facility]ail[facility]       [address]       [place], [place]  [id number]       Ph: [phone] or [phone]       Fax: [phone]   RxID:   [id number]

## 2010-05-11 NOTE — Miscellaneous (Signed)
  Clinical Lists Changes She is having nausea today. She had abdominal last night and , diarrhea after she ate hamberguer. Diarrhea 5 episode, she is drinking plenty of water. She relates that her blood sugar has being around 135. I will give her zofran for nausea. Patient was advised to go to ED if symptoms get worse. She relates that abdominal pain has resolved.  Medications: Added new medication of ZOFRAN ODT 4 MG TBDP (ONDANSETRON) take 1 tablet by mouth every 8 hour for nausea - Signed Rx of ZOFRAN ODT 4 MG TBDP (ONDANSETRON) take 1 tablet by mouth every 8 hour for nausea;  #3 x 0;  Signed;  Entered by: Hartley Barefoot MD;  Authorized by: Hartley Barefoot MD;  Method used: [facility] [address] [phone]*, [address], [place], [place], [place]  [address], Ph: [phone] or [phone], Fax: [phone]    Prescriptions: ZOFRAN ODT 4 MG TBDP (ONDANSETRON) take 1 tablet by mouth every 8 hour for nausea  #3 x 0   Entered and Authorized by:   Hartley Barefoot MD   Signed by:   Hartley Barefoot MD on 04/25/2008   Method used:   [facility]ron[facility] to        [facility] [address] [phone]* (retail)       [address]       [place], [place]  [address]       Ph: [phone] or [phone]       Fax: [phone]   RxID:   [id number]

## 2010-05-11 NOTE — Progress Notes (Signed)
  Phone Note Call from Patient   Reason for Call: Refill Medication Summary of Call: [facility] but pharmacy did not receive the electronic prescription. I confirmed this by talking to pharmacist and gave another prescription over the phone.  Initial call taken by: Clerance Lav MD,  September 30, 2009 8:20 PM    Prescriptions: PREDNISONE (PAK) 5 MG TABS (PREDNISONE) use as directed per pharmcist  #1 x 0   Entered and Authorized by:   Clerance Lav MD   Signed by:   Clerance Lav MD on 09/30/2009   Method used:   Telephoned to ...       [facility] [address] [phone]* (retail)       [address]       [place], [place]  [address]       Ph: [phone] or [phone]       Fax: [phone]   RxID:   [id number]
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
## 2010-05-11 NOTE — Miscellaneous (Signed)
Summary: Immunizations  Immunizations   Imported By: Florinda Marker 01/30/2006 18:47:09  _____________________________________________________________________  External Attachment:    Type:   Image     Comment:   External Document

## 2010-05-11 NOTE — Assessment & Plan Note (Signed)
Summary: worried about herpes/yeast/pcp-golding/hla   Vital Signs:  Patient Profile:   50 Years Old Female Height:     69 inches (175.26 cm) Weight:      362.8 pounds (164.91 kg) BMI:     53.77 Temp:     96.6 degrees F (35.89 degrees C) oral Pulse rate:   86 / minute BP sitting:   113 / 68  (right arm) Cuff size:   regular  Pt. in pain?   yes    Location:   R/ KNEE    Intensity:      8    Type:       aching  Vitals Entered By: Theotis Barrio NT II (June 12, 2008 11:26 AM)              Is Patient Diabetic? Yes Did you bring your meter with you today? No Nutritional Status BMI of > 30 = obese  Have you ever been in a relationship where you felt threatened, hurt or afraid?No   Does patient need assistance? Functional Status Self care Ambulation Normal     Chief Complaint:  RIGHT KNEE PAIN  / MEDICATION REFILL  / DM / ? YEAST .  History of Present Illness: 50 y/o with PMH of HTN, DM, recurrent vaginal infections comes in for vaginal discharge whitish for 1 week.  she relates no burning, she does have itchhyness  on her vuvla and vagina. no dysuria. No new sexual partenrs no painful intercourse. She relates antibiotic use for a kidney stone and infection about 2 weeks ago. She relates no fever or chills.    Prior Medication List:  TOPROL XL 200 MG TB24 (METOPROLOL SUCCINATE) Take 1 tablet by mouth once a day LOTENSIN 40 MG TABS (BENAZEPRIL HCL) 1 by mouth daily GLUCOPHAGE 1000 MG TABS (METFORMIN HCL) 1 by mouth two times a day VICODIN ES 7.5-750 MG TABS (HYDROCODONE-ACETAMINOPHEN) Take 1 tablet by mouth every 6 hours as needed for pain CALCIUM 500 MG TABS (CALCIUM)  GLUCOTROL 5 MG TABS (GLIPIZIDE) Take 1 tablet by mouth two times a day HYDROCHLOROTHIAZIDE 25 MG TABS (HYDROCHLOROTHIAZIDE) 1 by mouth daily ALPRAZOLAM 1 MG  TB24 (ALPRAZOLAM) Take 1 tablet by mouth three times a day as needed for anxiety LANTUS SOLOSTAR 100 UNIT/ML  SOLN (INSULIN GLARGINE) 34 units at  bedtime daily BD ULTRA-FINE PEN NEEDLES 29G X 12.7MM  MISC (INSULIN PEN NEEDLE)  NASONEX 50 MCG/ACT  SUSP (MOMETASONE FUROATE) Use 2 sprays in each nostril daily PROTONIX 40 MG  TBEC (PANTOPRAZOLE SODIUM) Take 1 tablet by mouth once a day LIDODERM 5 %  PTCH (LIDOCAINE) Apply one-two patches to knee or low back for 12 hours then off for 12 hours. VOLTAREN 1 %  GEL (DICLOFENAC SODIUM) Apply 4-8 grams to painful joints up to two times a day as needed and as directed. ATROVENT 0.06 %  SOLN (IPRATROPIUM BROMIDE) 2 puffs in each nostril up to three times a day as needed for nasal congestion. FLUCONAZOLE 150 MG  TABS (FLUCONAZOLE) take 1 tablet by mouth once and may repeat in 1 week if itching/dyscharge persists PROMETHAZINE HCL 25 MG  TABS (PROMETHAZINE HCL) Take 1 tablet by mouth every 6 hours VITAMIN D3 1000 UNIT CAPS (CHOLECALCIFEROL) Take 1 tablet by mouth once a day MUCINEX D [phone] MG XR12H-TAB (PSEUDOEPHEDRINE-GUAIFENESIN) Take 1 tablet by mouth two times a day as needed for congestion CEFTIN 250 MG TABS (CEFUROXIME AXETIL) Take 1 tablet by mouth two times a day   Current  Allergies: ! DARVOCET    Risk Factors: Tobacco use:  never Alcohol use:  no Exercise:  no Seatbelt use:  100 %  Mammogram History:    Date of Last Mammogram:  01/04/2006  PAP Smear History:    Date of Last PAP Smear:  01/04/2006   Review of Systems  The patient denies anorexia, fever, weight loss, weight gain, vision loss, decreased hearing, hoarseness, chest pain, prolonged cough, headaches, hemoptysis, melena, severe indigestion/heartburn, hematuria, genital sores, suspicious skin lesions, transient blindness, difficulty walking, depression, enlarged lymph nodes, angioedema, and breast masses.     Physical Exam  General:     Well-developed,well-nourished,in no acute distress; alert,appropriate and cooperative throughout examination Lungs:     Normal respiratory effort, chest expands symmetrically.  Lungs are clear to auscultation, no crackles or wheezes. Heart:     Normal rate and regular rhythm. S1 and S2 normal without gallop, murmur, click, rub or other extra sounds. Abdomen:     soft, non-tender, normal bowel sounds, no distention, no masses, no guarding, and no rigidity.   Genitalia:     differred. pt refused.    Impression & Recommendations:  Problem # 1:  VAGINAL DISCHARGE (ICD-623.5) Will treat empirically for yeast and BV b/o her history  of antibiotic use. Will start flagyl and lfuconazole empirically. Pt has no fever chills, abdominal pain,painful intercourse or symptoms that would conern me of PID.  If this pt continues to have recurrent vaginal infection will have to be treated aggressively. At this time she does have a risk factor for yeast (DM II and recent abx use).  Problem # 2:  KNEE PAIN, BILATERAL (ICD-719.46) Will refill her prescription, I have d/w with Dr. Phillips Odor. Her updated medication list for this problem includes:    Vicodin Es 7.5-750 Mg Tabs (Hydrocodone-acetaminophen) .Marland Kitchen... Take 1 tablet by mouth every 6 hours as needed for pain   Complete Medication List: 1)  Toprol Xl 200 Mg Tb24 (Metoprolol succinate) .... Take 1 tablet by mouth once a day 2)  Lotensin 40 Mg Tabs (Benazepril hcl) .Marland Kitchen.. 1 by mouth daily 3)  Glucophage 1000 Mg Tabs (Metformin hcl) .Marland Kitchen.. 1 by mouth two times a day 4)  Vicodin Es 7.5-750 Mg Tabs (Hydrocodone-acetaminophen) .... Take 1 tablet by mouth every 6 hours as needed for pain 5)  Calcium 500 Mg Tabs (Calcium) 6)  Glucotrol 5 Mg Tabs (Glipizide) .... Take 1 tablet by mouth two times a day 7)  Hydrochlorothiazide 25 Mg Tabs (Hydrochlorothiazide) .Marland Kitchen.. 1 by mouth daily 8)  Alprazolam 1 Mg Tb24 (Alprazolam) .... Take 1 tablet by mouth three times a day as needed for anxiety 9)  Lantus Solostar 100 Unit/ml Soln (Insulin glargine) .... 34 units at bedtime daily 10)  Bd Ultra-fine Pen Needles 29g X 12.80mm Misc (Insulin pen needle) 11)   Nasonex 50 Mcg/act Susp (Mometasone furoate) .... Use 2 sprays in each nostril daily 12)  Protonix 40 Mg Tbec (Pantoprazole sodium) .... Take 1 tablet by mouth once a day 13)  Lidoderm 5 % Ptch (Lidocaine) .... Apply one-two patches to knee or low back for 12 hours then off for 12 hours. 14)  Voltaren 1 % Gel (Diclofenac sodium) .... Apply 4-8 grams to painful joints up to two times a day as needed and as directed. 15)  Atrovent 0.06 % Soln (Ipratropium bromide) .... 2 puffs in each nostril up to three times a day as needed for nasal congestion. 16)  Fluconazole 150 Mg Tabs (Fluconazole) .... Take 1  tablet by mouth once and may repeat in 1 week if itching/dyscharge persists 17)  Promethazine Hcl 25 Mg Tabs (Promethazine hcl) .... Take 1 tablet by mouth every 6 hours 18)  Vitamin D3 1000 Unit Caps (Cholecalciferol) .... Take 1 tablet by mouth once a day 19)  Mucinex D [id number] Mg Xr12h-tab (Pseudoephedrine-guaifenesin) .... Take 1 tablet by mouth two times a day as needed for congestion 20)  Ceftin 250 Mg Tabs (Cefuroxime axetil) .... Take 1 tablet by mouth two times a day 21)  Flagyl 500 Mg Tabs (Metronidazole) .... Take 1 tablet by mouth two times a day 22)  Diflucan 150 Mg Tabs (Fluconazole) .... Take 1 tablet by mouth once a day   Patient Instructions: 1)  Please schedule an appointment with your primary doctor as needed 2)  Come back [facility] the [facility] if the symptoms persist.   Prescriptions: ALPRAZOLAM 1 MG  TB24 (ALPRAZOLAM) Take 1 tablet by mouth three times a day as needed for anxiety  #30 x 0   Entered and Authorized by:   Marinda Elk MD   Signed by:   Marinda Elk MD on 06/12/2008   Method used:   Print then Give to Patient   RxID:   [id number] VICODIN ES 7.5-750 MG TABS (HYDROCODONE-ACETAMINOPHEN) Take 1 tablet by mouth every 6 hours as needed for pain  #120 x 0   Entered and Authorized by:   Marinda Elk MD   Signed by:   Marinda Elk MD on  06/12/2008   Method used:   Print then Give to Patient   RxID:   [id number] DIFLUCAN 150 MG TABS (FLUCONAZOLE) Take 1 tablet by mouth once a day  #1 x 0   Entered and Authorized by:   Marinda Elk MD   Signed by:   Marinda Elk MD on 06/12/2008   Method used:   Electronically to        [facility] [address] [phone]* (retail)       [address]       [place], [place]  [address]       Ph: [phone]       Fax: [phone]   RxID:   [id number] FLAGYL 500 MG TABS (METRONIDAZOLE) Take 1 tablet by mouth two times a day  #14 x 0   Entered and Authorized by:   Marinda Elk MD   Signed by:   Marinda Elk MD on 06/12/2008   Method used:   Electronically to        [facility] [address] #[id number]* (retail)       [address]       [place], [place]  [address]       Ph: [phone]       Fax: [phone]   RxID:   [id number]

## 2010-05-11 NOTE — Assessment & Plan Note (Signed)
Summary: narcotic Rx/gg     see dr Beatrice Lecher notes   Vital Signs:  Patient profile:   50 year old female Height:      67.5 inches (171.45 cm) Weight:      370.2 pounds (168.27 kg) BMI:     57.33 Temp:     96.7 degrees F oral Pulse rate:   79 / minute BP sitting:   143 / 92  (right arm)  Vitals Entered By: Chinita Pester RN (February 03, 2009 2:28 PM) CC: Refill on medications; wants to change Percocet to something stronger/long acting Is Patient Diabetic? Yes Did you bring your meter with you today? No Pain Assessment Patient in pain? yes     Location: knees Intensity: 9 Type: sharp Onset of pain  Constant Nutritional Status BMI of > 30 = obese  Have you ever been in a relationship where you felt threatened, hurt or afraid?Unable to ask; someone w/pt.   Does patient need assistance? Functional Status Self care Ambulation Impaired:Risk for fall Comments Hx.of falling   Diabetic Foot Exam Last Podiatry Exam Date: 02/03/2009  Foot Inspection Is there a history of a foot ulcer?              No Is there a foot ulcer now?              No Can the patient see the bottom of their feet?          No Are the shoes appropriate in style and fit?          Yes Is there swelling or an abnormal foot shape?          No Are the toenails long?                No Are the toenails thick?                No Are the toenails ingrown?              No Is there heavy callous build-up?              No Is there pain in the calf muscle (Intermittent claudication) when walking?    No Diabetic Foot Care Education Patient educated on appropriate care of diabetic feet.  Comments: Some toenails are cut very short.   10-g (5.07) Semmes-Weinstein Monofilament Test Performed by: Chinita Pester RN          Right Foot          Left Foot Visual Inspection               Test Control      normal         normal Site 1         normal         normal Site 2         normal         normal Site 3         normal          normal Site 4         normal         normal Site 5         normal         normal Site 6         normal         normal Site 7         normal  normal Site 8         normal         normal Site 9         normal         normal Site 10                    normal  Impression      normal         normal   Primary Care Provider:  Julaine Fusi  DO  CC:  Refill on medications; wants to change Percocet to something stronger/long acting.  History of Present Illness: Pt is a 50 yo female w/ past medical history of  Hypertension Diabetes mellitus, type II Hyperlipidemia Obesity h/o dysfunctional uterine bleeding allergic rhinitis s/p tubal lugation Anxiety h/o chest pain w neg cardiolite in 2005 kidney stones with extraction 2009  here for refills on her pain meds.  She notes that her knees are extremely painful and states that she is working w/ specialist at [facility] for possible bariatric surgery.  She states she was supposed to come in today to get a longer acting pain medication.   She has continued to try to lose weight by watching her diet and cutting portions.  She is unable to exercise bc her knee pain is bad.    She notes that she also would like something for a vaginal yeast infection b/c she has itching and whitish discharge.  She also has rhinorrhea and recurrent congestion and would like to try something else.  Preventive Screening-Counseling & Management  Alcohol-Tobacco     Alcohol drinks/day: 0     Smoking Status: never  Caffeine-Diet-Exercise     Does Patient Exercise: no  Medications Prior to Update: 1)  Toprol Xl 200 Mg Tb24 (Metoprolol Succinate) .... Take 1 Tablet By Mouth Once A Day 2)  Lotensin 40 Mg Tabs (Benazepril Hcl) .Marland Kitchen.. 1 By Mouth Daily 3)  Glucophage 1000 Mg Tabs (Metformin Hcl) .Marland Kitchen.. 1 By Mouth Two Times A Day 4)  Glucotrol 5 Mg Tabs (Glipizide) .... Take 1 Tablet By Mouth Two Times A Day 5)  Lantus 100 Unit/ml Soln (Insulin Glargine) ....  Inject 34 Units At Bedtime 6)  Protonix 40 Mg  Tbec (Pantoprazole Sodium) .... Take 1 Tablet By Mouth Once A Day 7)  Vitamin D3 1000 Unit Caps (Cholecalciferol) .... Take 1 Tablet By Mouth Once A Day 8)  Gabapentin 100 Mg Caps (Gabapentin) .... 2 Tablets At Night For 2-3 Days Then Two Times A Day Then Three Times A Day Then 4 Tablets At Night 9)  Tb Syringe 1 Ml  Misc (Needles & Syringes) .... Use As Directed 10)  Lasix 20 Mg Tabs (Furosemide) .... Take 1 Tablet By Mouth Once A Day 11)  Oxycodone-Acetaminophen 10-325 Mg Tabs (Oxycodone-Acetaminophen) .... Take 1 Tablet By Mouth Three Times A Day 12)  Alprazolam 2 Mg Tabs (Alprazolam) .... Take 1 Tablet By Mouth Two Times A Day 13)  Klor-Con 20 Meq Pack (Potassium Chloride) .... Take 1 Tablet By Mouth Once A Day 14)  Fluconazole 150 Mg Tabs (Fluconazole) .... Take 1 Tablet. 15)  Carisoprodol 350 Mg Tabs (Carisoprodol) .... One By Mouth Three Times A Day 16)  Walker .... Use As Directed  Dx: Gait Instability, Fall  Current Medications (verified): 1)  Toprol Xl 200 Mg Tb24 (Metoprolol Succinate) .... Take 1 Tablet By Mouth Once A Day 2)  Lotensin 40 Mg Tabs (Benazepril Hcl) .Marland Kitchen.. 1 By Mouth  Daily 3)  Glucophage 1000 Mg Tabs (Metformin Hcl) .Marland Kitchen.. 1 By Mouth Two Times A Day 4)  Glucotrol 5 Mg Tabs (Glipizide) .... Take 1 Tablet By Mouth Two Times A Day 5)  Lantus 100 Unit/ml Soln (Insulin Glargine) .... Inject 40 Units At Bedtime 6)  Protonix 40 Mg  Tbec (Pantoprazole Sodium) .... Take 1 Tablet By Mouth Once A Day 7)  Vitamin D3 1000 Unit Caps (Cholecalciferol) .... Take 1 Tablet By Mouth Once A Day 8)  Gabapentin 100 Mg Caps (Gabapentin) .... Three Pills By Mouth Twice A Day. 9)  Tb Syringe 1 Ml  Misc (Needles & Syringes) .... Use As Directed 10)  Lasix 20 Mg Tabs (Furosemide) .... Take 1 Tablet By Mouth Once A Day 11)  Oxycodone-Acetaminophen 10-325 Mg Tabs (Oxycodone-Acetaminophen) .... Take 1 Tablet By Mouth Three Times A Day 12)  Alprazolam 2  Mg Tabs (Alprazolam) .... Take 1 Tablet By Mouth Two Times A Day 13)  Carisoprodol 350 Mg Tabs (Carisoprodol) .... One By Mouth Three Times A Day 14)  Walker .... Use As Directed  Dx: Gait Instability, Fall  Allergies (verified): 1)  ! Darvocet  Past History:  Past Medical History: Last updated: 02/25/2008 Hypertension Diabetes mellitus, type II Hyperlipidemia Obesity h/o dysfunctional uterine bleeding allergic rhinitis s/p tubal lugation Anxiety h/o chest pain w neg cardiolite in 2005 kidney stones with extraction 2009  Social History: Last updated: 03/20/2007 Married. No ETOH. No Tobacco. No Drugs.  Risk Factors: Smoking Status: never (02/03/2009)  Family History: Reviewed history from 03/20/2007 and no changes required. 1st degree relatives with: Obesity Diabetes Hypertension CAD  Social History: Reviewed history from 03/20/2007 and no changes required. Married. No ETOH. No Tobacco. No Drugs.Smoking Status:  never  Review of Systems       As per HPI.  Physical Exam  General:  alert, obese, sitting in wheelchair, no distress.   Eyes:  anicteric. Lungs:  normal respiratory effort and normal breath sounds.   Heart:  normal rate, regular rhythm, no murmur, no gallop, and no rub.   Abdomen:  soft, non-tender, normal bowel sounds, and no distention.   Pulses:  1+ DP pulses. Extremities:  No clubbing, cyanosis, edema, or deformity noted with normal full range of motion of all joints.    Diabetes Management Exam:    Foot Exam (with socks and/or shoes not present):       Sensory-Monofilament:          Left foot: normal          Right foot: normal   Impression & Recommendations:  Problem # 1:  KNEE PAIN, BILATERAL (ICD-719.46) Per prior notes by Dr. Phillips Odor, pt has multiple pain contract violations well documented and I declined to refill her scheduled substances today. She was most upset and had hoped on getting stronger narcotics, but I told her that  I would not be able to do that b/c of the contract violations.  However, we will plan on aggressively tx'g her pain w/ other alternatives including lidoderm patch and aggressive weight loss strategies.  She is also f/u w/ sport's medicine as needed.  The following medications were removed from the medication list:    Oxycodone-acetaminophen 10-325 Mg Tabs (Oxycodone-acetaminophen) .Marland Kitchen... Take 1 tablet by mouth three times a day Her updated medication list for this problem includes:    Carisoprodol 350 Mg Tabs (Carisoprodol) ..... One by mouth three times a day  Problem # 2:  MORBID OBESITY (ICD-278.01) Pt continues to try  to lose weight with portion control and calorie counting.  Exercise options are limited secondary to arthritis.  She has lost 3 lbs in last 2 months, offerred praise.  Problem # 3:  ALLERGIC RHINITIS (ICD-477.9) Will try veramyst spray for symptom management.  Her updated medication list for this problem includes:    Veramyst 27.5 Mcg/spray Susp (Fluticasone furoate) .Marland Kitchen... 1 spray to each nostril once a day.  Problem # 4:  ANXIETY STATE NOS (ICD-300.00) Prior UDS neg for benzos, did not refill.  The following medications were removed from the medication list:    Alprazolam 2 Mg Tabs (Alprazolam) .Marland Kitchen... Take 1 tablet by mouth two times a day  Problem # 5:  VAGINAL DISCHARGE (ICD-623.5) Hx c/w yeast, will try by mouth diflucan.  Pt would prefer to avoid creams if possible.  Problem # 6:  HYPERTENSION (ICD-401.9) BP up today.  Increased lasix to 40 mg/day and f/u lytes/cr today.  Her updated medication list for this problem includes:    Toprol Xl 200 Mg Tb24 (Metoprolol succinate) .Marland Kitchen... Take 1 tablet by mouth once a day    Lotensin 40 Mg Tabs (Benazepril hcl) .Marland Kitchen... 1 by mouth daily    Lasix 40 Mg Tabs (Furosemide) .Marland Kitchen... Take 1 tablet by mouth once a day  Problem # 7:  DIABETES MELLITUS, TYPE II (ICD-250.00) A1c above goal, increased lantus to 45 units at bedtime.   Increased BP meds, lipids today, goal LDL<70, foot exam today.  Eye referral made.  Her updated medication list for this problem includes:    Lotensin 40 Mg Tabs (Benazepril hcl) .Marland Kitchen... 1 by mouth daily    Glucophage 1000 Mg Tabs (Metformin hcl) .Marland Kitchen... 1 by mouth two times a day    Glucotrol 5 Mg Tabs (Glipizide) .Marland Kitchen... Take 1 tablet by mouth two times a day    Lantus 100 Unit/ml Soln (Insulin glargine) ..... Inject 45 units at bedtime  Orders: T- Capillary Blood Glucose ([id number]) T-Hgb A1C (in-house) ([id number]) Ophthalmology Referral (Ophthalmology)  Problem # 8:  Preventive Health Care (ICD-V70.0) Mammogram today, flu shot today.  Complete Medication List: 1)  Toprol Xl 200 Mg Tb24 (Metoprolol succinate) .... Take 1 tablet by mouth once a day 2)  Lotensin 40 Mg Tabs (Benazepril hcl) .Marland Kitchen.. 1 by mouth daily 3)  Glucophage 1000 Mg Tabs (Metformin hcl) .Marland Kitchen.. 1 by mouth two times a day 4)  Glucotrol 5 Mg Tabs (Glipizide) .... Take 1 tablet by mouth two times a day 5)  Lantus 100 Unit/ml Soln (Insulin glargine) .... Inject 45 units at bedtime 6)  Protonix 40 Mg Tbec (Pantoprazole sodium) .... Take 1 tablet by mouth once a day 7)  Vitamin D3 1000 Unit Caps (Cholecalciferol) .... Take 1 tablet by mouth once a day 8)  Gabapentin 100 Mg Caps (Gabapentin) .... Three pills by mouth twice a day. 9)  Tb Syringe 1 Ml Misc (Needles & syringes) .... Use as directed 10)  Lasix 40 Mg Tabs (Furosemide) .... Take 1 tablet by mouth once a day 11)  Carisoprodol 350 Mg Tabs (Carisoprodol) .... One by mouth three times a day 12)  Walker  .... Use as directed  dx: gait instability, fall 13)  Lidoderm 5 % Ptch (Lidocaine) .... Apply patch to affected knee for up to 12 hours a day. 14)  Diflucan 150 Mg Tabs (Fluconazole) .... Take 1 tablet by mouth once a day 15)  Veramyst 27.5 Mcg/spray Susp (Fluticasone furoate) .Marland Kitchen.. 1 spray to each nostril once a day.  Other Orders: Mammogram (Screening) (Mammo) T-Lipid Profile  ([id number]) T-Comprehensive Metabolic Panel [id number]) Influenza Vaccine MCR ([id number])  Patient Instructions: 1)  Please make a followup in one month for a check up. 2)  Please try to use the lidoderm patch on your knee as needed for pain. 3)  Please increase lasix to 40  mg once a day. 4)  Increase lantus to 45 units at night. Prescriptions: VERAMYST 27.5 MCG/SPRAY SUSP (FLUTICASONE FUROATE) 1 spray to each nostril once a day.  #1 bottle x 5   Entered and Authorized by:   Joaquin Courts  MD   Signed by:   Joaquin Courts  MD on 02/03/2009   Method used:   Electronically to        [facility] [address] [phone]* (retail)       [address]       [address]       [place], [place]  [address]       Ph: [phone]       Fax: [phone]   RxID:   [id number] DIFLUCAN 150 MG TABS (FLUCONAZOLE) Take 1 tablet by mouth once a day  #1 x 0   Entered and Authorized by:   Joaquin Courts  MD   Signed by:   Joaquin Courts  MD on 02/03/2009   Method used:   Electronically to        [facility] [address] [phone]* (retail)       [address]       [address]       [place], [place]  [address]       Ph: [phone]       Fax: [phone]   RxID:   [id number] LIDODERM 5 % PTCH (LIDOCAINE) Apply patch to affected knee for up to 12 hours a day.  #30 x 0   Entered and Authorized by:   Joaquin Courts  MD   Signed by:   Joaquin Courts  MD on 02/03/2009   Method used:   Electronically to        [facility] [address] [phone]* (retail)       [address]       [address]       [place], [place]  [address]       Ph: [phone]       Fax: [phone]   RxID:   [id number] LASIX 40 MG TABS (FUROSEMIDE) Take 1 tablet by mouth once a day  #30 x 0   Entered and Authorized by:   Joaquin Courts  MD   Signed by:   Joaquin Courts  MD on 02/03/2009   Method used:   Electronically to        [facility] [address] [phone]* (retail)       [address]       [address]        [place], [place]  [address]       Ph: [phone]       Fax: [phone]   RxID:   [id number]  Process Orders Check Orders Results:     [facility]: Check successful Tests Sent for requisitioning (February 03, 2009 4:18 PM):     02/03/2009: [facility] -- T-Lipid Profile [phone] (signed)     02/03/2009: [facility] -- T-Comprehensive Metabolic Panel [phone] (signed)    Prevention & Chronic Care Immunizations   Influenza vaccine: Fluvax MCR  (02/03/2009)   Influenza vaccine due: 12/10/2008  Tetanus booster: Not documented   Td booster deferral: Deferred  (09/24/2008)    Pneumococcal vaccine: Not documented  Other Screening   Pap smear: Normal  (01/04/2006)   Pap smear action/deferral: Deferred-2 yr interval  (09/24/2008)    Mammogram: Normal  (01/04/2006)   Mammogram action/deferral: Ordered  (02/03/2009)   Smoking status: never  (02/03/2009)  Diabetes Mellitus   HgbA1C: 7.6  (02/03/2009)   Hemoglobin A1C due: 12/25/2008    Eye exam: Not documented   Diabetic eye exam action/deferral: Ophthalmology referral  (02/03/2009)    Foot exam: yes  (02/03/2009)   Foot exam action/deferral: Do today   High risk foot: Not documented   Foot care education: Done  (02/03/2009)   Foot exam due: 01/11/2007    Urine microalbumin/creatinine ratio: 7.4  (02/25/2008)    Diabetes flowsheet reviewed?: Yes   Progress toward A1C goal: Improved  Lipids   Total Cholesterol: 159  (08/26/2008)   LDL: 78  (08/26/2008)   LDL Direct: Not documented   HDL: 67  (08/26/2008)   Triglycerides: 70  (08/26/2008)   Lipid panel due: 05/10/2006    SGOT (AST): 11  (08/26/2008)   SGPT (ALT): 9  (08/26/2008) CMP ordered    Alkaline phosphatase: 88  (08/26/2008)   Total bilirubin: 0.4  (08/26/2008)    Lipid flowsheet reviewed?: Yes   Progress toward LDL goal: At goal  Hypertension   Last Blood Pressure: 143 / 92  (02/03/2009)   Serum  creatinine: 1.02  (08/26/2008)   Serum potassium 4.5  (08/26/2008) CMP ordered     Hypertension flowsheet reviewed?: Yes   Progress toward BP goal: Deteriorated  Self-Management Support :   Personal Goals (by the next clinic visit) :     Personal A1C goal: 7  (09/24/2008)     Personal blood pressure goal: 130/80  (09/24/2008)     Personal LDL goal: 70  (09/24/2008)    Diabetes self-management support: Written self-care plan, Referred for DM self-management training  (09/24/2008)    Hypertension self-management support: Written self-care plan  (09/24/2008)    Lipid self-management support: Written self-care plan  (09/24/2008)    Nursing Instructions: Diabetic foot exam today Give Flu vaccine today Schedule screening mammogram (see order) Refer for screening diabetic eye exam (see order)    Laboratory Results   Blood Tests   Date/Time Received: February 03, 2009 2:36 PM  Date/Time Reported: Oren Beckmann  February 03, 2009 2:36 PM   HGBA1C: 7.6%   (Normal Range: Non-Diabetic - 3-6%   Control Diabetic - 6-8%) CBG Fasting:: 97mg /dL       Influenza Vaccine    Vaccine Type: Fluvax MCR    Site: right deltoid    Mfr: Novartis    Dose: 0.5 ml    Route: IM    Given by: Chinita Pester RN    Exp. Date: 06/2009    Lot #: [id number]    VIS given: 11/02/06 version given February 03, 2009.  Flu Vaccine Consent Questions    Do you have a history of severe allergic reactions to this vaccine? no    Any prior history of allergic reactions to egg and/or gelatin? no    Do you have a sensitivity to the preservative Thimersol? no    Do you have a past history of Guillan-Barre Syndrome? no    Do you currently have an acute febrile illness? no    Have you ever had a severe reaction to latex? no    Vaccine information given and  explained to patient? yes    Are you currently pregnant? no

## 2010-05-11 NOTE — Progress Notes (Signed)
Summary: phone/gg  Phone Note Call from Patient   Summary of Call: Pt called states she was seen at [facility] on 3/25 for sore throat and flu.  THey treated with zyrtec and ibu . strep test was neg.   Now still has sore throat.  The patients boyfriends wife has BV and pt has oral sex with  boyfriend, could this cause the sore throat?  Can you treat this.  Pt is out of town. Also having anxiety problems, she is taking lorazapam 1 1/2 mg to 2 mg  twice a day.   She would prefer xanax 2 mg.  Will you change her back??  She is out of meds. Initial call taken by: Merrie Roof RN,  July 07, 2009 2:16 PM  Follow-up for Phone Call        I spoke about this with Dr. Sampson Goon and Dr. Rogelia Boga and they said she should be seen to rule-out a sexually-transmitted infection (likely not BV but possibly gonorrhea) in her throat. If she's out of town, we would suggest that she go to an urgent care center where she is at and tell them about her concerns.   As for the Xanax, she is not to receive any more Xanax from this clinic, so I cannot prescribe it. Follow-up by: Silvestre Gunner MD,  July 07, 2009 4:14 PM  Additional Follow-up for Phone Call Additional follow up Details #1::        Pt informed about above.   will send this to Dr Phillips Odor PCP for decision on early refill of lorazapam  Additional Follow-up by: Merrie Roof RN,  July 07, 2009 4:39 PM    Additional Follow-up for Phone Call Additional follow up Details #2::    No benzodiazapines should be prescribed for this patient. It is clear that she has been irresponible about these medications. There are multiple calls after hours requesting these medications. She is also in violation of her pain contract. Follow-up by: Julaine Fusi  DO,  July 09, 2009 1:36 PM

## 2010-05-11 NOTE — Progress Notes (Signed)
Summary: refill/gg  Phone Note Refill Request  on January 04, 2010 4:17 PM  Refills Requested: Medication #1:  CARISOPRODOL 350 MG TABS one by mouth three times a day  Method Requested: Electronic Initial call taken by: Merrie Roof RN,  January 04, 2010 4:18 PM  Follow-up for Phone Call        Rx denied because it is refilled by Dr[facility] Jennette K[facility] Follow-up by: Julaine Fusi  DO,  January 05, 2010 3:43 PM  Additional Follow-up for Phone Call Additional follow up Details #1::        Pt called , she has refill on this med at [facility]. She will talk with Dr Phillips Odor at next visit about refilling this med per request of Dr Jennette Kettle Additional Follow-up by: Merrie Roof RN,  January 07, 2010 12:03 PM

## 2010-05-11 NOTE — Assessment & Plan Note (Signed)
Summary: ACUTE-SORE THROAT-COUGH-(SASTRY)/CFB   Vital Signs:  Patient Profile:   49 Years Old Female Height:     69 inches (175.26 cm) Weight:      373.3 pounds (169.68 kg) BMI:     55.33 Temp:     97.4 degrees F (36.33 degrees C) oral Pulse rate:   101 / minute BP sitting:   122 / 81  (right arm) Cuff size:   large  Pt. in pain?   yes    Location:   knees/ankle    Intensity:   9    Type:       aching  Vitals Entered By: Theotis Barrio (October 20, 2006 11:43 AM)              Is Patient Diabetic? Yes  Nutritional Status normal CBG Result 277  Does patient need assistance? Functional Status Self care Ambulation Normal   Chief Complaint:  compline of knees and ankle / medication refill / cough- productive with dark yellowish green color.  History of Present Illness: 50 year old woman with a past medical history of Diabetes Mellitus Type 2, Hypertension and depression presents to the office today  with a 3 week history of cough, nasal congestion, and sore throat. Over the past week she has developed facial pain and her sputum has become a thick green color. She has also noticed that her CBG's have been running higher than normal.  In addition she reports the recent loss of her son in [place] war and the struggles she is having with her daughter who is suffering from drug addiction. She has insomnia and anxiety related to these problems.  Current Allergies: ! DARVOCET    Risk Factors:  Alcohol use:  no Exercise:  no Seatbelt use:  100 %  Mammogram History:    Date of Last Mammogram:  01/04/2006  PAP Smear History:    Date of Last PAP Smear:  01/04/2006    Physical Exam  General:     alert, well-developed, and overweight-appearing.   Head:     no abnormalities observed.   Eyes:     pupils equal, pupils round, pupils reactive to light, corneas and lenses clear, and no injection.   Ears:     R ear normal and L ear normal.   Nose:     mucosal erythema and  mucosal edema.  maxillary tenderness Mouth:     throat w/green pnd Neck:     supple and no masses.   Lungs:     scattered ronchi. good air movement. no wheezing. Heart:     Normal rate and regular rhythm. S1 and S2 normal without gallop, murmur, click, rub or other extra sounds. Abdomen:     Bowel sounds positive,abdomen soft and non-tender  Msk:     Bilateral knee effusions. Pulses:     2+ DP Extremities:     trace edema Neurologic:     alert & oriented X3.   Psych:     normally interactive and slightly anxious.      Impression & Recommendations:  Problem # 1:  UPPER RESPIRATORY INFECTION (ICD-465.9) Likely a bacterial infection given the length of her symptoms and recent change in sputum color, facial pain and subjective fevers. Will start her on 7 days of Doxycycline for URI. Provide cough medication. Discussed symptom management.  Her updated medication list for this problem includes:    Tussionex Pennkinetic Er 8-10 Mg/68ml Lqcr (Chlorpheniramine-hydrocodone) .Marland Kitchen... Take 5ml by mouth q 12 hours as  needed for cough.   Problem # 2:  ANXIETY STATE NOS (ICD-300.00) Refilled her anxiolytic. Discussed effects of long term use and that this was not a solution to her disorder. Will refer her to SW for evaluation.  The following medications were removed from the medication list:    Ativan 1 Mg Tabs (Lorazepam) .Marland Kitchen... 1po three times a day prn  Her updated medication list for this problem includes:    Alprazolam 1 Mg Tb24 (Alprazolam) .Marland Kitchen... Take 1 tablet by mouth once a day at bedtime   Problem # 3:  DIABETES MELLITUS, TYPE II (ICD-250.00) Very High A1C today. Poor control likely due to presence of infection. She is currently on oral medications for her diabetes. At this point, she needs to go on insulin therapy. She would benfit from the addition of Lantus to her oral medications. Will bring her in on Monday for instruction on how to use Lantus insulin. She says that she has  not been very good about taking her oral medications. She is open to the idea of using insulin to treat her diabetes. Will also refer her to [facility] for education. Will have her check her CBGs twice daily and bring in readings for evaluations.  Her updated medication list for this problem includes:    Lotensin 40 Mg Tabs (Benazepril hcl) .Marland Kitchen... 1 by mouth daily    Glucophage 1000 Mg Tabs (Metformin hcl) .Marland Kitchen... 1 by mouth two times a day    Glucotrol 5 Mg Tabs (Glipizide) .Marland Kitchen... 1po bid  Orders: T-Urinalysis ([id number]) T-Basic Metabolic Panel [phone]) T-CBC w/Diff [phone])  Labs Reviewed: HgBA1c: 9.3 (10/20/2006)   Creat: 0.77 (04/14/2006)      Problem # 4:  HYPERTENSION (ICD-401.9) Stable. Her updated medication list for this problem includes:    Toprol Xl 200 Mg Tb24 (Metoprolol succinate) .Marland Kitchen... 1 by mouth daily    Lotensin 40 Mg Tabs (Benazepril hcl) .Marland Kitchen... 1 by mouth daily    Hydrochlorothiazide 25 Mg Tabs (Hydrochlorothiazide) .Marland Kitchen... 1 by mouth daily   Medications Added to Medication List This Visit: 1)  Doxy-caps 100 Mg Caps (Doxycycline hyclate) .... Take 1 tablet by mouth two times a day for 10 days 2)  Alprazolam 1 Mg Tb24 (Alprazolam) .... Take 1 tablet by mouth once a day at bedtime 3)  Tussionex Pennkinetic Er 8-10 Mg/22ml Lqcr (Chlorpheniramine-hydrocodone) .... Take 5ml by mouth q 12 hours as needed for cough.  Other Orders: T- Capillary Blood Glucose (66063) T-Hgb A1C (in-house) ([id number]) Diabetic Clinic Referral (Diabetic)   Patient Instructions: 1)  Please schedule appointment w/ Dr.Golding Monday Morning for diabetes insulin instruction. 2)  Also schedule appt.w/Diabetes education    Prescriptions: TUSSIONEX PENNKINETIC ER 8-10 MG/5ML  LQCR (CHLORPHENIRAMINE-HYDROCODONE) Take 5ml by mouth q 12 hours as needed for cough.  #2109ml x 0   Entered and Authorized by:   Julaine Fusi  DO   Signed by:   Julaine Fusi  DO on 10/20/2006   Method used:    Print then Give to Patient   RxID:   [id number] ALPRAZOLAM 1 MG  TB24 (ALPRAZOLAM) Take 1 tablet by mouth once a day at bedtime  #31 x 3   Entered and Authorized by:   Julaine Fusi  DO   Signed by:   Julaine Fusi  DO on 10/20/2006   Method used:   Print then Give to Patient   RxID:   [id number] DOXY-CAPS 100 MG  CAPS (DOXYCYCLINE HYCLATE) Take 1 tablet by mouth two times a day  for 10 days  #20 x 0   Entered and Authorized by:   Julaine Fusi  DO   Signed by:   Julaine Fusi  DO on 10/20/2006   Method used:   Print then Give to Patient   RxID:   [id number]     Laboratory Results   Blood Tests   Date/Time Recieved: October 20, 2006 12:20 PM  Date/Time Reported: ..................................................................Marland KitchenAlric Quan  October 20, 2006 12:20 PM   HGBA1C: 9.3%   (Normal Range: Non-Diabetic - 3-6%   Control Diabetic - 6-8%) CBG Random: 277

## 2010-05-11 NOTE — Assessment & Plan Note (Signed)
Summary: NOON APPT - SHOULDER PAIN/POSS INJECTION/MJD   Vital Signs:  Patient profile:   50 year old female BP sitting:   138 / 88  Vitals Entered By: Lillia Pauls CMA (January 02, 2009 12:32 PM)  History of Present Illness: Left shoulder pain--like before it hurts with opverhead reach. Pain 3-5/10. no numbness or tingling  Allergies: 1)  ! Darvocet  Physical Exam  Additional Exam:  + imoingement signs L shouldwer w pain on supraspinatus testing  Patient given informed consent for injection. Discussed possible complications of infection, bleeding or skin atrophy at site of injection. Possible side effect of avascular necrosis (focal area of bone death) due to steroid use.Appropriate verbal time out taken Are cleaned and prepped in usual sterile fashion. A --1-- cc kennalog plus --3--cc 1% lidocaine without epinephrine was injected into the---left subacromial bursa. Patient tolerated procedure well with no complications.    Impression & Recommendations:  Problem # 1:  ROTATOR CUFF SYNDROME, LEFT (ICD-726.10)  Orders: Joint Aspirate / Injection, Large (16109)  Complete Medication List: 1)  Toprol Xl 200 Mg Tb24 (Metoprolol succinate) .... Take 1 tablet by mouth once a day 2)  Lotensin 40 Mg Tabs (Benazepril hcl) .Marland Kitchen.. 1 by mouth daily 3)  Glucophage 1000 Mg Tabs (Metformin hcl) .Marland Kitchen.. 1 by mouth two times a day 4)  Glucotrol 5 Mg Tabs (Glipizide) .... Take 1 tablet by mouth two times a day 5)  Lantus 100 Unit/ml Soln (Insulin glargine) .... Inject 34 units at bedtime 6)  Protonix 40 Mg Tbec (Pantoprazole sodium) .... Take 1 tablet by mouth once a day 7)  Vitamin D3 1000 Unit Caps (Cholecalciferol) .... Take 1 tablet by mouth once a day 8)  Gabapentin 100 Mg Caps (Gabapentin) .... 2 tablets at night for 2-3 days then two times a day then three times a day then 4 tablets at night 9)  Tb Syringe 1 Ml Misc (Needles & syringes) .... Use as directed 10)  Lasix 20 Mg Tabs (Furosemide)  .... Take 1 tablet by mouth once a day 11)  Oxycodone-acetaminophen 10-325 Mg Tabs (Oxycodone-acetaminophen) .... Take 1 tablet by mouth three times a day 12)  Alprazolam 2 Mg Tabs (Alprazolam) .... Take 1 tablet by mouth two times a day 13)  Klor-con 20 Meq Pack (Potassium chloride) .... Take 1 tablet by mouth once a day 14)  Fluconazole 150 Mg Tabs (Fluconazole) .... Take 1 tablet.

## 2010-05-11 NOTE — Progress Notes (Signed)
  Phone Note Call from Patient   Reason for Call: Acute Illness, Refill Medication Complaint: Urinary/GYN Problems Summary of Call: Call from patient complaining of vaginal discharge and itching again. She reported that treatmnet in Nov took care of her discomfort but she has same symptoms again. Patient has an appoinment with Dr. Phillips Odor next month. Will refill her diflucan now and has advised patient to come to clinic if symptoms failed to improved.    New/Updated Medications: DIFLUCAN 200 MG TABS (FLUCONAZOLE) Take 1 tab by mouth daily for 3 days. Prescriptions: DIFLUCAN 200 MG TABS (FLUCONAZOLE) Take 1 tab by mouth daily for 3 days.  #3 x 0   Entered and Authorized by:   Vassie Loll MD   Signed by:   Vassie Loll MD on 03/13/2010   Method used:   Electronically to        [facility] [address] [phone]* (retail)       [address]       [address]       [place], [place]  [address]       Ph: [phone]       Fax: [phone]   RxID:   [id number]

## 2010-05-11 NOTE — Miscellaneous (Signed)
Summary: SW Referral  Patient has been referred to me by Julaine Fusi.  I called the patient and she said that Dr. Phillips Odor had given her information on [facility].  The patient now tells me that she has just received Medicaid and Medicare which should make life much easier for her.  Unfortunately, [facility] does not accept dual insureds where the Medicare is primary and the Medicaid secondary.  So I told the patient that I would send her a list of four or five therapists in the area who accept Medicare.   SW followup.   Appended Document: SW Referral I have found two therapists who will accept Medicare and work with pt. re: copayment.  I have sent the names of Maria [name] and Maria [name] to the patient so she can seriously consider counseling.
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
## 2010-05-11 NOTE — Progress Notes (Signed)
Summary: Refill/gh  Phone Note Refill Request Message from:  Patient on September 26, 2007 2:41 PM  Refills Requested: Medication #1:  HYDROCHLOROTHIAZIDE 25 MG TABS 1 by mouth daily  Medication #2:  LOTENSIN 40 MG TABS 1 by mouth daily  Medication #3:  GLUCOPHAGE 1000 MG TABS 1 by mouth two times a day  Medication #4:  VICODIN ES 7.5-750 MG TABS Take 1 tablet by mouth two times a day Initial call taken by: Angelina Ok RN,  September 26, 2007 2:43 PM  Follow-up for Phone Call        Refill approved-nurse to complete Follow-up by: Julaine Fusi  DO,  September 28, 2007 4:13 AM  Additional Follow-up for Phone Call Additional follow up Details #1::        Rx called to pharmacy Additional Follow-up by: Angelina Ok RN,  October 04, 2007 2:52 PM      Prescriptions: GLUCOPHAGE 1000 MG TABS (METFORMIN HCL) 1 by mouth two times a day  #60 x 12   Entered and Authorized by:   Julaine Fusi  DO   Signed by:   Julaine Fusi  DO on 09/27/2007   Method used:   Telephoned to ...       [facility]       [address] [place], [place]  [address]       Ph: [phone]       Fax: [phone]   RxID:   [id number] LOTENSIN 40 MG TABS (BENAZEPRIL HCL) 1 by mouth daily  #10 x 12   Entered and Authorized by:   Julaine Fusi  DO   Signed by:   Julaine Fusi  DO on 09/27/2007   Method used:   Telephoned to ...       [facility]       [address] [place], [place]  [address]       Ph: [phone]       Fax: [phone]   RxID:   [id number] HYDROCHLOROTHIAZIDE 25 MG TABS (HYDROCHLOROTHIAZIDE) 1 by mouth daily  #30 x 12   Entered and Authorized by:   Julaine Fusi  DO   Signed by:   Julaine Fusi  DO on 09/27/2007   Method used:   Telephoned to ...       [facility]       [address] [place], [place]  [address]       Ph: [phone]       Fax: [phone]   RxID:   [id number] VICODIN ES 7.5-750 MG TABS  (HYDROCODONE-ACETAMINOPHEN) Take 1 tablet by mouth two times a day  #60 x 3   Entered and Authorized by:   Julaine Fusi  DO   Signed by:   Julaine Fusi  DO on 09/27/2007   Method used:   Telephoned to ...       [facility]       [address] [place], [place]  [address]       Ph: [phone]       Fax: [phone]   RxID:   [id number]

## 2010-05-11 NOTE — Assessment & Plan Note (Signed)
Summary: R arm skin biopsy/pcp-Maria [name]/hla   Vital Signs:  Patient profile:   50 year old female Height:      69 inches Weight:      356.5 pounds BMI:     53.06 Temp:     97.2 degrees F oral Pulse rate:   76 / minute BP sitting:   126 / 81  (left arm) Cuff size:   WRIST  Vitals Entered By: Theotis Barrio NT II (September 24, 2008 2:47 PM) CC: PATIENT STATES SHE IS HERE FOR SKIN BX / RIGHT KNEE PAIN #8 /  MEDICATION REFILL Is Patient Diabetic? Yes  Pain Assessment Patient in pain? yes     Location: R/KNEE Intensity:  8 Type: ache Onset of pain  Chronic Nutritional Status BMI of > 30 = obese CBG Result 185  Have you ever been in a relationship where you felt threatened, hurt or afraid?No   Does patient need assistance? Functional Status Self care Ambulation Normal Comments IN WHEELCHAIR/  PATIENT STATES SHE IS HERE FOR SKIN BX /  PAIN IN RIGHT KNEE #41F / MEDICATION REFILL   CC:  PATIENT STATES SHE IS HERE FOR SKIN BX / RIGHT KNEE PAIN #8 /  MEDICATION REFILL.  Preventive Screening-Counseling & Management  Alcohol-Tobacco     Smoking Status: never  Caffeine-Diet-Exercise     Does Patient Exercise: no  Allergies: 1)  ! Darvocet   Impression & Recommendations:  Problem # 1:  PAIN MANAGEMENT CONTRACT (09/01/2008) on contract refills today.  Problem # 2:  DIABETES MELLITUS, TYPE II (ICD-250.00) Ran out of insulin- temp problem with medicaid. I will refill lantus today- pharmacy will not fill pen insulin- will need syringes.  Her updated medication list for this problem includes:    Lotensin 40 Mg Tabs (Benazepril hcl) .Marland Kitchen... 1 by mouth daily    Glucophage 1000 Mg Tabs (Metformin hcl) .Marland Kitchen... 1 by mouth two times a day    Glucotrol 5 Mg Tabs (Glipizide) .Marland Kitchen... Take 1 tablet by mouth two times a day    Lantus 100 Unit/ml Soln (Insulin glargine) ..... Inject 34 units at bedtime  Orders: T- Capillary Blood Glucose (82948) T-Hgb A1C (in-house) (16109UE)  Problem # 3:   NEVUS (ICD-216.9)  Will send for biopsy- likely a dermatofibroma. No complications with procedure.  Small, Elliptical incision- no sutures needed. Bacti and gauze drg- will see back in 1 month.   Orders: Biopsy (Punch) Skin, Single Lesion (11100)  Complete Medication List: 1)  Toprol Xl 200 Mg Tb24 (Metoprolol succinate) .... Take 1 tablet by mouth once a day 2)  Lotensin 40 Mg Tabs (Benazepril hcl) .Marland Kitchen.. 1 by mouth daily 3)  Glucophage 1000 Mg Tabs (Metformin hcl) .Marland Kitchen.. 1 by mouth two times a day 4)  Calcium 500 Mg Tabs (Calcium) 5)  Glucotrol 5 Mg Tabs (Glipizide) .... Take 1 tablet by mouth two times a day 6)  Hydrochlorothiazide 25 Mg Tabs (Hydrochlorothiazide) .Marland Kitchen.. 1 by mouth daily 7)  Alprazolam 1 Mg Tb24 (Alprazolam) .... Take 1 tablet by mouth three times a day as needed for anxiety 8)  Lantus 100 Unit/ml Soln (Insulin glargine) .... Inject 34 units at bedtime 9)  Bd Ultra-fine Pen Needles 29g X 12.67mm Misc (Insulin pen needle) 10)  Protonix 40 Mg Tbec (Pantoprazole sodium) .... Take 1 tablet by mouth once a day 11)  Voltaren 1 % Gel (Diclofenac sodium) .... Apply 4-8 grams to painful joints up to two times a day as needed and as directed. 12)  Atrovent 0.06 % Soln (Ipratropium bromide) .... 2 puffs in each nostril up to three times a day as needed for nasal congestion. 13)  Fluconazole 150 Mg Tabs (Fluconazole) .... Take 1 tablet by mouth once and may repeat in 1 week if itching/dyscharge persists 14)  Promethazine Hcl 25 Mg Tabs (Promethazine hcl) .... Take 1 tablet by mouth every 6 hours 15)  Vitamin D3 1000 Unit Caps (Cholecalciferol) .... Take 1 tablet by mouth once a day 16)  Oxycodone-acetaminophen 10-325 Mg Tabs (Oxycodone-acetaminophen) .... Take 1 tablet by mouth two times a day 17)  Astelin 137 Mcg/spray Soln (Azelastine hcl) .... One spray in each nostril two times a day as directed 18)  Gabapentin 100 Mg Caps (Gabapentin) .... 2 tablets at night for 2-3 days then two times  a day then three times a day then 4 tablets at night 19)  Meloxicam 15 Mg Tabs (Meloxicam) .... One by mouth daily - per dr draper 20)  Fracture Boot Medium  .... Dx: achill tendonitis use as directed. dispense 1 21)  Tb Syringe 1 Ml Misc (Needles & syringes) .... Use as directed  Patient Instructions: 1)  Schedule early July with Phillips Odor Prescriptions: TB SYRINGE 1 ML  MISC (NEEDLES & SYRINGES) Use as directed  #100 x 6   Entered and Authorized by:   Julaine Fusi  DO   Signed by:   Julaine Fusi  DO on 09/24/2008   Method used:   Electronically to        [facility] [address] [phone]* (retail)       [address]       [address]       [place], [place]  [address]       Ph: [phone]       Fax: [phone]   RxID:   [id number] LANTUS 100 UNIT/ML SOLN (INSULIN GLARGINE) Inject 34 units at bedtime  #1 mo x 6   Entered and Authorized by:   Julaine Fusi  DO   Signed by:   Julaine Fusi  DO on 09/24/2008   Method used:   Electronically to        [facility] [address] [phone]* (retail)       [address]       [address]       [place], [place]  [address]       Ph: [phone]       Fax: [phone]   RxID:   [id number] LANTUS SOLOSTAR 100 UNIT/ML  SOLN (INSULIN GLARGINE) 34 units at bedtime daily  #3 boxes x 6   Entered and Authorized by:   Julaine Fusi  DO   Signed by:   Julaine Fusi  DO on 09/24/2008   Method used:   Print then Give to Patient   RxID:   [id number] OXYCODONE-ACETAMINOPHEN 10-325 MG TABS (OXYCODONE-ACETAMINOPHEN) Take 1 tablet by mouth two times a day  #60 x 0   Entered and Authorized by:   Julaine Fusi  DO   Signed by:   Julaine Fusi  DO on 09/24/2008   Method used:   Print then Give to Patient   RxID:   [id number] BD ULTRA-FINE PEN NEEDLES 29G X 12.7MM  MISC (INSULIN PEN NEEDLE)   #60 x 6   Entered and Authorized by:   Julaine Fusi  DO   Signed by:   Julaine Fusi  DO on 09/24/2008   Method used:   Print then Give to Patient   RxID:  [id number] LANTUS SOLOSTAR 100 UNIT/ML  SOLN (INSULIN GLARGINE) 34 units at bedtime daily  #3 moth supp x 6   Entered and Authorized by:   Julaine Fusi  DO   Signed by:   Julaine Fusi  DO on 09/24/2008   Method used:   Electronically to        [facility] [address] [phone]* (retail)       [address]       [address]       [place], [place]  [address]       Ph: [phone]       Fax: [phone]   RxID:   [id number]   Laboratory Results   Blood Tests   Date/Time Received: September 24, 2008 3:50 PM. Date/Time Reported: Alric Quan  September 24, 2008 3:51 PM   HGBA1C: 8.1%   (Normal Range: Non-Diabetic - 3-6%   Control Diabetic - 6-8%) CBG Random:: 185mg /dL        Procedure Note  Biopsy: Biopsy Type: Skin The patient complains of changing mole but denies pain, redness, tenderness, discharge, and fever. Indication: suspicious lesion  Procedure # 1: elliptical incision with 2 mm margin    Size (in cm): 0.5 x 0.5    Region: dorsal    Location: arm-lower-right    Instrument used: #10 blade    Anesthesia: 1% lidocaine w/o epinephrine  Cleaned and prepped with: betadine Wound dressing: neosporin and pressure dressing Instructions: RTC in 10-14 days   Prevention & Chronic Care Immunizations   Influenza vaccine: Not documented   Influenza vaccine due: 12/10/2008    Tetanus booster: Not documented   Td booster deferral: Deferred  (09/24/2008)    Pneumococcal vaccine: Not documented  Other Screening   Pap smear: Normal  (01/04/2006)   Pap smear action/deferral: Deferred-2 yr interval  (09/24/2008)    Mammogram: Normal  (01/04/2006)   Mammogram action/deferral: Deferred-2 yr interval  (09/24/2008)    Smoking status: never  (09/24/2008)  Diabetes Mellitus   HgbA1C: 8.1  (09/24/2008)   Hemoglobin A1C due: 12/25/2008    Eye exam: Not documented   Diabetic eye exam action/deferral: Ophthalmology referral  (09/24/2008)    Foot exam: yes   (02/25/2008)   High risk foot: Not documented   Foot care education: 05/09/2006  (10/24/2006)   Foot exam due: 01/11/2007    Urine microalbumin/creatinine ratio: 7.4  (02/25/2008)  Hypertension   Last Blood Pressure: 126 / 81  (09/24/2008)   Serum creatinine: 1.02  (08/26/2008)   Serum potassium 4.5  (08/26/2008)  Lipids   Total Cholesterol: 159  (08/26/2008)   LDL: 78  (08/26/2008)   LDL Direct: Not documented   HDL: 67  (08/26/2008)   Triglycerides: 70  (08/26/2008)   Lipid panel due: 05/10/2006    SGOT (AST): 11  (08/26/2008)   SGPT (ALT): 9  (08/26/2008)   Alkaline phosphatase: 88  (08/26/2008)   Total bilirubin: 0.4  (08/26/2008)  Self-Management Support :   Personal Goals (by the next clinic visit) :     Personal A1C goal: 7  (09/24/2008)     Personal blood pressure goal: 130/80  (09/24/2008)     Personal LDL goal: 70  (09/24/2008)    Diabetes self-management support: Written self-care plan, Referred for DM self-management training  (09/24/2008)   Diabetes care plan printed    Hypertension self-management support: Written self-care plan  (09/24/2008)   Hypertension self-care plan printed.    Lipid self-management support: Written self-care plan  (09/24/2008)  Lipid self-care plan printed.   Nursing Instructions: Refer for screening diabetic eye exam (see order) Refer for diabetes self-management training (see order)    Diabetes Self Management Training Referral Patient Name: Maria [name] Date Of Birth: 12/19/1960 MRN: [id number] Current Diagnosis:  ROTATOR CUFF SYNDROME, LEFT (ICD-726.10) NEVUS (ICD-216.9) PAIN MANAGEMENT CONTRACT (09/01/2008) PRE-OPERATIVE CARDIOVASCULAR EXAMINATION (ICD-V72.81) ACHILLES TENDINITIS (ICD-726.71) VAGINAL DISCHARGE (ICD-623.5) CONTRACEPTIVE MANAGEMENT (ICD-V25.09) GERD (ICD-530.81) URINARY TRACT INFECTION, RECURRENT (ICD-599.0) ENCOUNTERS OTHER SPEC ADMINISTRATIVE PURPOSE OTH (ICD-V68.89) KNEE PAIN, BILATERAL  (ICD-719.46) UPPER RESPIRATORY INFECTION (ICD-465.9) MAMMOGRAM, ABNORMAL, HX OF (ICD-V15.9) CHEST PAIN, ATYPICAL, HX OF (ICD-V15.89) OBSTRUCTIVE SLEEP APNEA (ICD-327.23) ANXIETY STATE NOS (ICD-300.00) HYPERLIPIDEMIA (ICD-272.4) DIABETES MELLITUS, TYPE II (ICD-250.00) TUBAL LIGATION, HX OF (ICD-V26.51) ALLERGIC RHINITIS (ICD-477.9) DYSFUNCTIONAL UTERINE BLEEDING (ICD-626.8) MORBID OBESITY (ICD-278.01) HYPERTENSION (ICD-401.9)   Complicating Conditions:

## 2010-05-11 NOTE — Progress Notes (Signed)
Summary: Refill/gh  Phone Note Refill Request Message from:  Patient on December 10, 2008 9:21 AM  Refills Requested: Medication #1:  OXYCODONE-ACETAMINOPHEN 10-325 MG TABS Take 1 tablet by mouth three times a day  Medication #2:  TOPROL XL 200 MG TB24 Take 1 tablet by mouth once a day  Medication #3:  LOTENSIN 40 MG TABS 1 by mouth daily  Medication #4:  GLUCOPHAGE 1000 MG TABS 1 by mouth two times a day Does she need additional labs.   Method Requested: Fax to Local Pharmacy Initial call taken by: Angelina Ok RN,  December 10, 2008 9:21 AM Caller: Patient  Follow-up for Phone Call        No opiates. Will need to discuss second negative UDS. I just refiledl all of these other medications- how do we get these requests? do they come from the pharmacy??. She needs to come in and have labs drawn- she left last visit w/o labs. Needs BMET. This is also an early refil request for her oxycodone -so again in violation of her contract. Follow-up by: Julaine Fusi  DO,  December 10, 2008 10:40 AM

## 2010-05-11 NOTE — Letter (Signed)
Summary: DMV Form  DMV Form   Imported By: Louretta Parma 04/09/2007 16:50:13  _____________________________________________________________________  External Attachment:    Type:   Image     Comment:   External Document

## 2010-05-11 NOTE — Medication Information (Signed)
Summary: RX  History  RX  History   Imported By: Florinda Marker 12/03/2007 11:41:08  _____________________________________________________________________  External Attachment:    Type:   Image     Comment:   External Document

## 2010-05-11 NOTE — Progress Notes (Signed)
Summary: Refill/gh  Phone Note Refill Request Message from:  Patient on Aug 10, 2009 11:21 AM  Refills Requested: Medication #1:  Pen Needles Pt needs Pen Needles for her Solostar Pen.   Method Requested: Electronic Initial call taken by: Angelina Ok RN,  Aug 10, 2009 11:22 AM  Follow-up for Phone Call        Patient seen -see office note Follow-up by: Julaine Fusi  DO,  Aug 10, 2009 2:38 PM

## 2010-05-11 NOTE — Progress Notes (Signed)
Summary: call for xanax refill  Phone Note Call from Patient   Reason for Call: Acute Illness Summary of Call: [name] called complaining of increased anxiety and with request for urgent refill of her xanax with increased prescription.  She reports that she was started on the medication when her first son died in [place], and now her second son is shipping out which is causing her significant stress.  She has actually been taking 1mg  tid (more than prescribed) and is down to her last pill on this Saturday afternoon.  She does not feel she can make it through Sunday without more xanax.  I have called in 5 tablets of 1mg .  She will call the clinic monday to discuss this with someone and to be evaluated for medication change.

## 2010-05-11 NOTE — Progress Notes (Signed)
  Pt. called me at 1600 pager asking for narcotic pain meds. I asked the pt. to call the clinic in AM.   Appended Document:  Patient will need appoitment for ANY increase or pain medication refill for evaluation, pill count etc... she is on strict pain contract.

## 2010-05-11 NOTE — Progress Notes (Signed)
  Phone Note Call from Patient Call back at [facility] Phone [phone]   Caller: Patient Summary of Call: Needs a meter, having low blood sugars. tried to return call, left a message for her. Initial call taken by: Jamison Neighbor RD,CDE,  Aug 16, 2007 1:06 PM  Follow-up for Phone Call        Tried to reach [place] again today. Left message. Follow-up by: Jamison Neighbor RD,CDE,  Aug 17, 2007 10:06 AM

## 2010-05-11 NOTE — Progress Notes (Signed)
Summary: med clarification/gp  Phone Note Refill Request Message from:  Fax from Pharmacy on October 05, 2009 5:27 PM  Refills Requested: Medication #1:  PREDNISONE (PAK) 5 MG TABS use as directed per pharmcist Pharmacy needs clarification - 6 day pack or 12 day pack?  thanks   Method Requested: Electronic Initial call taken by: Chinita Pester RN,  October 05, 2009 5:27 PM  Follow-up for Phone Call        Rivka Barbara would you call and verify the date on this script. Was this prescribed on the 22nd. See note on 6/25 where she tells Dr. Sherryll Burger that the prednisone didnt work. Im guessing she never took it. For clarification shoudl be a 6 day pak. Thanks. Follow-up by: Julaine Fusi  DO,  October 06, 2009 11:33 AM  Additional Follow-up for Phone Call Additional follow up Details #1::        Yes, it was written on 09/30/09. Additional Follow-up by: Chinita Pester RN,  October 07, 2009 3:58 PM

## 2010-05-11 NOTE — Progress Notes (Signed)
  Phone Note Call from Patient   Caller: Patient Reason for Call: Acute Illness Complaint: Cough/Sore throat Action Taken: Appt Scheduled Summary of Call: Pt  called today with sore throat, fever and productive sputum, She has had no relief with nasal decongestants and pain medications and feels that symptoms are getting worse. She will need to be seen in the [facility]. I have told her that she will be called with appointment Initial call taken by: Ronda Fairly    pt was called, she was given appt 7/11 @ 1130 ..................................................................Marland KitchenMarin Roberts RN  October 19, 2006 9:59 AM

## 2010-05-11 NOTE — Miscellaneous (Signed)
Summary: Orders Update/ per Dr Jennette Kettle  Clinical Lists Changes  Problems: Added new problem of GOUT, UNSPECIFIED (ICD-274.9) Orders: Added new Test order of T-Basic Metabolic Panel [phone]) - Signed Added new Test order of T-Uric Acid (Blood) [phone]) - Signed     Process Orders Check Orders Results:     [facility]: Check successful Tests Sent for requisitioning (October 14, 2009 9:58 AM):     10/09/2009: [facility] -- T-Basic Metabolic Panel [id number] (signed)     10/09/2009: [facility] -- T-Uric Acid (Blood) [[id number]] (signed)

## 2010-05-11 NOTE — Progress Notes (Signed)
Summary: refill/gg  **  Phone Note Call from Patient   Summary of Call: Pt called and is only able to get vicodin 1 at bedtime.  The Rx has to be written to take two times a day not HS.  I can call in the change if that's what you want.  You did give her # 50 a month **  just the way it was written. Please advise Initial call taken by: Merrie Roof RN,  April 11, 2007 11:30 AM  Follow-up for Phone Call        Yes, One tab by mouth q 12 hours as needed. Thanks. Will update. Follow-up by: Julaine Fusi  DO,  April 11, 2007 1:44 PM  Additional Follow-up for Phone Call Additional follow up Details #1::        Rx Called In Additional Follow-up by: Merrie Roof RN,  April 11, 2007 3:40 PM

## 2010-05-11 NOTE — Assessment & Plan Note (Signed)
Summary: AFTER HOURS REQUEST FOR OPIATES DENIED  Patient called on 10/10/09 at 19:30 pm. States that has a Gout flare up. Was evaluated By Dr. Sherryll Burger last week with Indomethacin  which did not provide pain relief. States that last Saturday Dr. Aldine Contes gave her refill on Percocet #20. Patient requests another refill. Request for a Percocet denied. Explained to the patient that she needs to be reevaluated; and that no refills on Opioids are done after hours. Instructed to go to ED if develops intractable pain. Patient verbalized understanding.

## 2010-05-11 NOTE — Miscellaneous (Signed)
[facility]   Imported By: Margie Billet 09/08/2009 14:07:21  _____________________________________________________________________  External Attachment:    Type:   Image     Comment:   External Document

## 2010-05-11 NOTE — Progress Notes (Signed)
Summary: Refill/gh  Phone Note Call from Patient   Caller: Patient Call For: Dr. Phillips Odor Summary of Call: Pt called stated went to [facility] for appointment with Orthopaedic referral.  DR. did not prescribe any treatment.  Is having pain in her knees and would like to get a refill on her Vicodin 7.5 mg.  Would like to have prescription sent to [facility] [address] Initial call taken by: Angelina Ok RN,  January 03, 2007 10:46 AM  Follow-up for Phone Call        I can't tell who sent pt to [facility] or why. Not clear why she is on [place]. Will ask Dr Phillips Odor to help. Follow-up by: Ned Grace MD,  January 03, 2007 11:12 AM

## 2010-05-11 NOTE — Progress Notes (Signed)
  Phone Note Refill Request Message from:  Patient on January 04, 2010 12:32 PM  Refills Requested: Medication #1:  CARISOPRODOL 350 MG TABS one by mouth three times a day [facility]  Initial call taken by: Marily Memos,  January 04, 2010 12:32 PM    Pt notified that she needs to request refill from PCP.

## 2010-05-11 NOTE — Progress Notes (Signed)
Summary: Kidney Stones  Phone Note Call from Patient   Caller: Patient Call For: Maria Fusi  DO Summary of Call: Pt called is currently in the ED for a Kidney Stone.  Says she has been waiting in the ED for a while is in a great deal of pian and wants to be seen in the Clinics instead.  Pt was informed that we did not have an opening this pm and that the ED was her best option.  Pt wants doctor from Clinics to come up to see her.  Pt was advised to remain tin the ED to be seen.Angelina Ok RN  September 16, 2008 3:32 PM  Initial call taken by: Angelina Ok RN,  September 16, 2008 3:32 PM  Follow-up for Phone Call        Agree.  Follow-up by: Ned Grace MD,  September 16, 2008 3:35 PM  Additional Follow-up for Phone Call Additional follow up Details #1::        Will see her in clinic if I have an opening. Additional Follow-up by: Maria Fusi  DO,  September 17, 2008 8:43 AM

## 2010-05-11 NOTE — Progress Notes (Signed)
Summary: phone/gg  Phone Note Call from Patient   Caller: Patient Summary of Call: Pt called and wanted to anxiety medication.   She called Dr Baltazar Apo on nights and she was going to talk with you about the meds. Pt is having a bad month and really needs something.  I read Sidhu's note and pt does not seem aware that she is not to get any benzos. please call her at [phone] Initial call taken by: Merrie Roof RN,  February 04, 2010 2:56 PM  Follow-up for Phone Call        I talked with Dr Phillips Odor and she states pt isnot to get and benzo's from the clinic.   She needs to f/u with her psychiatrist for this type of medication. I called pt and left message.  she should return call. Follow-up by: Merrie Roof RN,  February 04, 2010 3:32 PM

## 2010-05-11 NOTE — Miscellaneous (Signed)
Summary: Orders Update: DOT Work Physical  Clinical Lists Changes  Orders: Added new Test order of T-Urinalysis Dipstick only (16109UE) - Signed Added new Service order of Vision Screening (45409) - Signed

## 2010-05-11 NOTE — Progress Notes (Signed)
  Phone Note Call from Patient   Caller: Patient Reason for Call: Talk to Doctor Action Taken: Phone Call Completed Summary of Call: Pt called with complain of tingling and numbness in arm and hand. Wonders if she needs immediate evaluation. Also mentions that symptoms are chronic but have flared acutely. she has appointment tomorrow with Sports medicine. She has no particular weakness in arm. I have asked her to mention this to sports med and be evaluated for possible carpel tunnel syndrome.  Initial call taken by: Clerance Lav MD,  January 19, 2009 9:16 PM

## 2010-05-11 NOTE — Progress Notes (Signed)
Summary: phone/gg  Phone Note Call from Patient   Summary of Call: Pt called and is out of lantus.  I called for a PA for lantus solostar will not be approved.  Pt does not need PA for lantus solution but pt just received a vial 6/16 and is out 2 weeks early.  I called pharmacy and asked them to call for an override from the help desk.  I informed pt that if this does not work to call us in the AM and we will see what else we could do. Initial call taken by: Merrie Roof RN,  October 06, 2008 5:07 PM  Follow-up for Phone Call        Will refer her to Jamison Neighbor for assistance- she needs a follow-up appointment with Lupita Leash ASAP. Follow-up by: Julaine Fusi  DO,  October 07, 2008 8:58 AM  Additional Follow-up for Phone Call Additional follow up Details #1::        Call from pt needs to get a sample of the Lantus Solostar Insulin. Angelina Ok RN  October 07, 2008 10:55 AM  Additional Follow-up by: Angelina Ok RN,  October 07, 2008 10:55 AM                                                                      Prescriptions: LANTUS 100 UNIT/ML SOLN (INSULIN GLARGINE) Inject 34 units at bedtime  #1 mo x 6   Entered by:   Madaline Guthrie MD   Authorized by:   Julaine Fusi  DO   Signed by:   Madaline Guthrie MD on 10/07/2008   Method used:   Electronically to        [facility] [address] [phone]* (retail)       [address]       [address]       [place], [place]  [address]       Ph: [phone]       Fax: [phone]   RxID:   [id number]   Appended Document: phone/gg Prescriptions: LANTUS SOLOSTAR 100 UNIT/ML SOLN (INSULIN GLARGINE) Inject 34 units subcutaneously at bedtime or as directed by physician  #3 ml x 0   Entered and Authorized by:   Margarito Liner MD   Signed by:   Margarito Liner MD on 10/07/2008   Method used:   Samples Given   RxID:   [id number]  Sample Given, Lot #: [id number] Expiration Date:  10-2010 Patient has been instructed regarding the correct time, dose and frequency of taking this med, including desired effects and most common side effects.

## 2010-05-11 NOTE — Miscellaneous (Signed)
Summary: [facility]pain contract violation records  [facility]   Imported By: Florinda Marker 11/05/2008 14:38:36  _____________________________________________________________________  External Attachment:    Type:   Image     Comment:   External Document

## 2010-05-11 NOTE — Assessment & Plan Note (Signed)
Summary: patient phone call  Prescriptions: FLUCONAZOLE 150 MG  TABS (FLUCONAZOLE) take 1 tablet by mouth once and may repeat in 1 week if itching/dyscharge persists  #1 x 2   Entered and Authorized by:   Elby Showers MD   Signed by:   Elby Showers MD on 04/15/2008   Method used:   Electronically to        [facility] [address] #[id number]* (retail)       [address] [place], [place]  [address]       Ph: [phone]       Fax: [phone]   RxID:   [id number]   Patient called to report familiar symptoms of vulvar itching and burning.  She has had several yeast infections in the past, and these symptoms are the same as previous.  She has had no dysuria, is not sexually active, no fevers/chills, or rash else where.  Will refill fluconazole electronically, she should come to clinic if this does not resolve symptoms.

## 2010-05-11 NOTE — Miscellaneous (Signed)
Summary: [facility] [facility]   Imported By: Florinda Marker 12/05/2008 14:07:54  _____________________________________________________________________  External Attachment:    Type:   Image     Comment:   External Document

## 2010-05-11 NOTE — Progress Notes (Signed)
Summary: Prescription   Phone Note Outgoing Call   Call placed by: Angelina Ok RN,  March 23, 2007 4:44 PM Call placed to: Patient Summary of Call: Pt called msg left with female to have pt call the Clinics.  Prescription for Bactrium 800 mg-160 mg tablets # 6 take 1 by mouth twice daily [facility] order of Dr. Phillips Odor with no refills.  Pharmacist said that pt has other medication there awaiting pick up. Initial call taken by: Angelina Ok RN,  March 23, 2007 4:45 PM

## 2010-05-11 NOTE — Progress Notes (Signed)
Summary: refill/gg  Phone Note Refill Request  on November 18, 2008 5:03 PM  Refills Requested: Medication #1:  LOTENSIN 40 MG TABS 1 by mouth daily  Method Requested: Electronic Initial call taken by: Merrie Roof RN,  November 18, 2008 5:04 PM  Follow-up for Phone Call        Refill approved-nurse to complete    Prescriptions: LOTENSIN 40 MG TABS (BENAZEPRIL HCL) 1 by mouth daily  #10 x 12   Entered and Authorized by:   Julaine Fusi  DO   Signed by:   Julaine Fusi  DO on 11/20/2008   Method used:   Electronically to        [facility] [phone]* (retail)       [address]       [address]       [place], [place]  [address]       Ph: [phone]       Fax: [phone]   RxID:   [id number]

## 2010-05-11 NOTE — Letter (Signed)
Summary: Historic Patient File  Historic Patient File   Imported By: Florinda Marker 01/30/2006 18:42:53  _____________________________________________________________________  External Attachment:    Type:   Image     Comment:   External Document

## 2010-05-11 NOTE — Assessment & Plan Note (Signed)
Summary: med refill    Updated Prior Medication List: TOPROL XL 200 MG TB24 (METOPROLOL SUCCINATE) Take 1 tablet by mouth once a day LOTENSIN 40 MG TABS (BENAZEPRIL HCL) 1 by mouth daily GLUCOPHAGE 1000 MG TABS (METFORMIN HCL) 1 by mouth two times a day GLUCOTROL 5 MG TABS (GLIPIZIDE) Take 1 tablet by mouth two times a day LANTUS 100 UNIT/ML SOLN (INSULIN GLARGINE) Inject 45 units at bedtime PROTONIX 40 MG  TBEC (PANTOPRAZOLE SODIUM) Take 1 tablet by mouth once a day VITAMIN D3 1000 UNIT CAPS (CHOLECALCIFEROL) Take 1 tablet by mouth once a day GABAPENTIN 100 MG CAPS (GABAPENTIN) Three pills by mouth twice a day. TB SYRINGE 1 ML  MISC (NEEDLES & SYRINGES) Use as directed LASIX 40 MG TABS (FUROSEMIDE) Take 1 tablet by mouth once a day CARISOPRODOL 350 MG TABS (CARISOPRODOL) one by mouth three times a day * WALKER use as directed  dx: gait instability, fall LIDODERM 5 % PTCH (LIDOCAINE) Apply patch to affected knee for up to 12 hours a day. DIFLUCAN 150 MG TABS (FLUCONAZOLE) Take 1 tablet by mouth once a day VERAMYST 27.5 MCG/SPRAY SUSP (FLUTICASONE FUROATE) 1 spray to each nostril once a day. FLUTICASONE PROPIONATE 50 MCG/ACT SUSP (FLUTICASONE PROPIONATE) 2 puffs iin both nares q day ZOLOFT 100 MG TABS (SERTRALINE HCL) Take 1/2 tablet every day for one week. Increase to one tablet per day thereafter. ALPRAZOLAM 2 MG TABS (ALPRAZOLAM) Take 1 tablet by mouth an needed at bedtime  Current Allergies: ! DARVOCET  Appended Document: med refill Clinical Lists Changes  Patient called for xanax refill and said has run out of her xanax and 1mg  does not work for her anxiety, states that 2 mg works well. She has taken xanax 2 mg two times a day before, Has called her pharmacy for refill this.

## 2010-05-11 NOTE — Miscellaneous (Signed)
  Clinical Lists Changes  Directives: Added new directive of NO BENZODIAZAPINES DEFER TO [facility]

## 2010-05-11 NOTE — Progress Notes (Signed)
Summary: Refill/gh  Phone Note Refill Request Message from:  Fax from Pharmacy on December 28, 2009 11:06 AM  Refills Requested: Medication #1:  LOTENSIN 40 MG TABS 1 by mouth daily   Last Refilled: 12/26/2009  Method Requested: Electronic Initial call taken by: Angelina Ok RN,  December 28, 2009 11:06 AM  Follow-up for Phone Call        Refill approved-nurse to complete Follow-up by: Julaine Fusi  DO,  December 31, 2009 2:59 PM    Prescriptions: GLUCOTROL 5 MG TABS (GLIPIZIDE) Take 1 tablet by mouth two times a day  #60 x 11   Entered and Authorized by:   Julaine Fusi  DO   Signed by:   Julaine Fusi  DO on 12/31/2009   Method used:   Electronically to        [facility] #[id number]* (retail)       [address] [place], [place]  [address]       Ph: [phone]       Fax: [phone]   RxID:   [id number] LOTENSIN 40 MG TABS (BENAZEPRIL HCL) 1 by mouth daily  #30 x 11   Entered and Authorized by:   Julaine Fusi  DO   Signed by:   Julaine Fusi  DO on 12/31/2009   Method used:   Electronically to        [facility] #[id number]* (retail)       [address] [place], [place]  [address]       Ph: [phone]       Fax: [phone]   RxID:   [id number]

## 2010-05-11 NOTE — Progress Notes (Signed)
Summary: phone/gg  Phone Note Refill Request  on May 21, 2008 12:36 PM  Refills Requested: Medication #1:  VICODIN ES 7.5-750 MG TABS Take 1 tablet by mouth every 6 hours as needed for pain   Last Refilled: 04/21/2008  Method Requested: Telephone to Pharmacy Initial call taken by: Merrie Roof RN,  May 21, 2008 12:24 PM Caller: Patient Summary of Call: Pt called with c/o sinus infection and wanted to be seen.  No appointments today, return call to pt and no answer. Message left   Pt return call, [facility] Initial call taken by: Merrie Roof RN,  May 21, 2008 12:05 PM  Follow-up for Phone Call        Refill approved-nurse to complete Follow-up by: Julaine Fusi  DO,  May 22, 2008 9:00 AM  Additional Follow-up for Phone Call Additional follow up Details #1::        Rx called to pharmacy # [id number] no refills Additional Follow-up by: Merrie Roof RN,  May 21, 2008 12:30 PM      Prescriptions: VICODIN ES 7.5-750 MG TABS (HYDROCODONE-ACETAMINOPHEN) Take 1 tablet by mouth every 6 hours as needed for pain  #120 x 0   Entered and Authorized by:   Julaine Fusi  DO   Signed by:   Julaine Fusi  DO on 05/22/2008   Method used:   Handwritten   RxID:   [id number]

## 2010-05-11 NOTE — Assessment & Plan Note (Signed)
Summary: Follow up Diabetes              CBG Result 157     History of Present Illness: 50 year old woman with poorly controlled Diabetes Mellitus Type 2 recently started on insulin returns today for routine follow up. She reports doing VERY well on the insulin. No problems or complaints. She does report continued LBP, moderate to severe- chronic, stable and bilateral knee arthritis.  Current Allergies: ! DARVOCET  Past Medical History:    Hypertension    Diabetes mellitus, type II    Hyperlipidemia    Obesity    h/o dysfunctional uterine bleeding    allergic rhinitis    s/p tubal lugation    Anxiety    h/o chest pain w neg cardiolite in 2005    Risk Factors:  Tobacco use:  never   Review of Systems  The patient denies anorexia, chest pain, syncope, dyspnea on exhertion, peripheral edema, prolonged cough, hemoptysis, and abdominal pain.     Physical Exam  General:     alert, well-developed, and overweight-appearing.   Lungs:     scattered ronchi. good air movement. no wheezing. Heart:     Normal rate and regular rhythm. S1 and S2 normal without gallop, murmur, click, rub or other extra sounds. Abdomen:     Bowel sounds positive,abdomen soft and non-tender  Msk:     Bilateral knee effusions. Low back tender to palpation. Pulses:     2+ Extremities:     normal, no edema Neurologic:     non-focal    Impression & Recommendations:  Problem # 1:  DIABETES MELLITUS, TYPE II (ICD-250.00) Improved A1C today- no change to current regemen.  Her updated medication list for this problem includes:    Lotensin 40 Mg Tabs (Benazepril hcl) .Marland Kitchen... 1 by mouth daily    Glucophage 1000 Mg Tabs (Metformin hcl) .Marland Kitchen... 1 by mouth two times a day    Glucotrol 5 Mg Tabs (Glipizide) .Marland Kitchen... 1po bid    Lantus Solostar 100 Unit/ml Soln (Insulin glargine) .Marland KitchenMarland KitchenMarland KitchenMarland Kitchen 34 units at bedtime daily  Labs Reviewed: HgBA1c: 7.5 (12/28/2006)   Creat: 0.79 (10/20/2006)      Problem # 2:   HYPERTENSION (ICD-401.9) Stable, borderline. No changes today.  Her updated medication list for this problem includes:    Toprol Xl 200 Mg Tb24 (Metoprolol succinate) .Marland Kitchen... 1 by mouth daily    Lotensin 40 Mg Tabs (Benazepril hcl) .Marland Kitchen... 1 by mouth daily    Hydrochlorothiazide 25 Mg Tabs (Hydrochlorothiazide) .Marland Kitchen... 1 by mouth daily  Prior BP: 140/92 (10/23/2006)  Labs Reviewed: Creat: 0.79 (10/20/2006)   Problem # 3:  KNEE PAIN, BILATERAL (ICD-719.46) Hx of tri-meniscal surgery- chronic pain stable. Will need to refer back to orthopedics for evaluation.  Her updated medication list for this problem includes:    Vicodin Es 7.5-750 Mg Tabs (Hydrocodone-acetaminophen) .Marland Kitchen... Take 1 tablet by mouth at bedtime as needed for pain   Complete Medication List: 1)  Toprol Xl 200 Mg Tb24 (Metoprolol succinate) .Marland Kitchen.. 1 by mouth daily 2)  Lotensin 40 Mg Tabs (Benazepril hcl) .Marland Kitchen.. 1 by mouth daily 3)  Glucophage 1000 Mg Tabs (Metformin hcl) .Marland Kitchen.. 1 by mouth two times a day 4)  Vicodin Es 7.5-750 Mg Tabs (Hydrocodone-acetaminophen) .... Take 1 tablet by mouth at bedtime as needed for pain 5)  Calcium 500 Mg Tabs (Calcium) 6)  Glucotrol 5 Mg Tabs (Glipizide) .Marland Kitchen.. 1po bid 7)  Hydrochlorothiazide 25 Mg Tabs (Hydrochlorothiazide) .Marland Kitchen.. 1 by  mouth daily 8)  Alprazolam 1 Mg Tb24 (Alprazolam) .... Take 1 tablet by mouth once a day at bedtime 9)  Tussionex Pennkinetic Er 8-10 Mg/59ml Lqcr (Chlorpheniramine-hydrocodone) .... Take 5ml by mouth q 12 hours as needed for cough. 10)  Lantus Solostar 100 Unit/ml Soln (Insulin glargine) .... 34 units at bedtime daily 11)  Bd Ultra-fine Pen Needles 29g X 12.6mm Misc (Insulin pen needle) 12)  Nasonex 50 Mcg/act Susp (Mometasone furoate) .... Use 2 sprays in each nostril daily     ] Laboratory Results   Blood Tests   Date/Time Recieved: December 28, 2006 3:17 PM  Date/Time Reported: ..................................................................Marland KitchenOren Beckmann   December 28, 2006 3:17 PM   HGBA1C: 7.5%   (Normal Range: Non-Diabetic - 3-6%   Control Diabetic - 6-8%) CBG Random: 157

## 2010-05-11 NOTE — Progress Notes (Signed)
  Phone Note Call from Patient   Caller: Patient Reason for Call: Acute Illness, Refill Medication Action Taken: Rx Called In, Provider Notified, Appt Scheduled Details for Reason: Patient complaining of pain and feet swelling. Details of Complaint: Simillar symptoms that she experienced when empirically treated for gout. Details of Action Taken: Prescription for indomethacin and colcrys call in; appoinment for clinic visit during bussiness hours electronically requested. Summary of Call: Patient with hx of pain her feet and swelling for the last couple of months; she has been empirically treated for gout after having an uric acid level elevated during acute flare, all this while she was seen by sports medicine doctor. Patient used colcrys and indomethacine in the past with resolution of her symptoms for couple of weeks; she is now experiencing same symptoms since Friday.  Will call colcrys and indomethacine for her and will request appoinment electronically for her to be seen in the clinic either tomorrow or the day after.    New/Updated Medications: INDOMETHACIN 50 MG CAPS (INDOMETHACIN) Take 1 tab bye mouth every 12hrs as needed for pain. COLCRYS 0.6 MG TABS (COLCHICINE) take one tab by mouth two times a day for 5 days, then 1 tab daily. Prescriptions: INDOMETHACIN 50 MG CAPS (INDOMETHACIN) Take 1 tab bye mouth every 12hrs as needed for pain.  #50 x 1   Entered and Authorized by:   Vassie Loll MD   Signed by:   Vassie Loll MD on 11/15/2009   Method used:   Electronically to        [facility] [phone][facility] (retail)       [address]       [place], [place]  [address]       Ph: [phone] or [phone]       Fax: [phone]   RxID:   [id number] COLCRYS 0.6 MG TABS (COLCHICINE) take one tab by mouth two times a day for 5 days, then 1 tab daily.  #31 x 3   Entered and Authorized by:   Vassie Loll MD   Signed by:   Vassie Loll MD on  11/15/2009   Method used:   [facility]ronically [facility]        [facility] [address] [phone][facility] (retail)       [address]       [place], [place]  [address]       Ph: [phone] or [phone]       Fax: [phone]   RxID:   [id number]

## 2010-05-11 NOTE — Progress Notes (Signed)
Summary: weight- loss surgery/ hla  Phone Note Call from Patient   Caller: Patient Summary of Call: has paperwork to have weight loss surgery, does she need an appt or can you complete it w/out seeing her? Initial call taken by: Marin Roberts RN,  Aug 11, 2008 3:27 PM  Follow-up for Phone Call        spoke w/ dr Phillips Odor, she will fill the papers out when pt drops off and pick up at appt Follow-up by: Marin Roberts RN,  Aug 12, 2008 5:14 PM

## 2010-05-11 NOTE — Progress Notes (Signed)
Summary: REfill/gh  Phone Note Refill Request Message from:  Fax from Pharmacy on October 26, 2009 3:22 PM  Refills Requested: Medication #1:  Clonopin 1 mg tablets Pt did not get to see her therapist today at [facility].  Will not get in until next month to see a therapist.   Method Requested: Electronic Initial call taken by: Angelina Ok RN,  October 26, 2009 3:23 PM  Follow-up for Phone Call        Will need her to see mental health for all bezodiazapine prescriptions. If they need to advance her a prescription they can do so, but it appears that no one from [facility] has ever prescribed them and that in reality she is getting the prescription from a PA in [place]? Follow-up by: Julaine Fusi  DO,  October 27, 2009 4:27 PM  Additional Follow-up for Phone Call Additional follow up Details #1::        pt called to ask for this script and was given the message she quickly said thank you and hung up Additional Follow-up by: Marin Roberts RN,  October 29, 2009 1:48 PM

## 2010-05-11 NOTE — Progress Notes (Signed)
Summary: phone/gg      Phone Note Call from Patient   Summary of Call: Pt called and is having an MRI at [facility] radiology - 8:45 PM.  She is asking for 1 or 2  valium 10 mg to relax her during procedure. She fell and has torn ligament in left knee. Initial call taken by: Merrie Roof RN,  March 08, 2010 3:49 PM  Follow-up for Phone Call        Call and see if she is indeed having the MRI done. Then tell her to call teh provider that ordered it for the prescription. Follow-up by: Julaine Fusi  DO,  March 08, 2010 4:19 PM  Additional Follow-up for Phone Call Additional follow up Details #1::        Pt called and message left to call provider that ordered MRI Additional Follow-up by: Merrie Roof RN,  March 08, 2010 5:27 PM

## 2010-05-11 NOTE — Assessment & Plan Note (Signed)
Summary: foot pain/gg   Vital Signs:  Patient Profile:   50 Years Old Female Height:     69 inches (175.26 cm) Weight:      378.8 pounds (172.18 kg) BMI:     56.14 Temp:     98.2 degrees F (36.78 degrees C) oral Pulse rate:   96 / minute BP sitting:   152 / 100  (left arm)  Vitals Entered By: Krystal Eaton Duncan Dull) (February 25, 2008 2:26 PM)             CBG Result 135     Chief Complaint:  med refill.  History of Present Illness: Maria [name] is in today for routine care, med refills. Her major complaint is anxiety and chronic grief. See A&P.    Current Allergies (reviewed today): ! DARVOCET  Past Medical History:    Reviewed history from 12/28/2006 and no changes required:       Hypertension       Diabetes mellitus, type II       Hyperlipidemia       Obesity       h/o dysfunctional uterine bleeding       allergic rhinitis       s/p tubal lugation       Anxiety       h/o chest pain w neg cardiolite in 2005       kidney stones with extraction 2009   Family History:    Reviewed history from 03/20/2007 and no changes required:       1st degree relatives with:       Obesity       Diabetes       Hypertension       CAD  Social History:    Reviewed history from 03/20/2007 and no changes required:       Married.       No ETOH.       No Tobacco.       No Drugs.     Physical Exam  General:     Well-developed,well-nourished,in no acute distress; alert,appropriate and cooperative throughout examination Lungs:     CTAB Heart:     RRR Abdomen:     soft and non-tender.   Msk:     Right knee tender to palpation along the joint line. Severely limited ROM in bilateral knees- now has achilles tendon boot on her right foot-achilles rupture Extremities:     no edema  Diabetes Management Exam:    Foot Exam (with socks and/or shoes not present):       Sensory-Pinprick/Light touch:          Left medial foot (L-4): normal          Left dorsal foot (L-5): normal          Left lateral foot (S-1): normal          Right medial foot (L-4): normal          Right dorsal foot (L-5): normal          Right lateral foot (S-1): normal       Sensory-Monofilament:          Left foot: normal          Right foot: normal       Inspection:          Left foot: normal          Right foot: normal       Nails:  Left foot: normal          Right foot: normal    Eye Exam:       Eye Exam done elsewhere     Impression & Recommendations:  Problem # 1:  DIABETES MELLITUS, TYPE II (ICD-250.00) Due for A1C today. Has been well controlled since starting insulin therapy. No changes today.  Urine micro,A1C, foot exam today, eye exam within the last year done-will need to get records.  Her updated medication list for this problem includes:    Lotensin 40 Mg Tabs (Benazepril hcl) .Marland Kitchen... 1 by mouth daily    Glucophage 1000 Mg Tabs (Metformin hcl) .Marland Kitchen... 1 by mouth two times a day    Glucotrol 5 Mg Tabs (Glipizide) .Marland Kitchen... Take 1 tablet by mouth two times a day    Lantus Solostar 100 Unit/ml Soln (Insulin glargine) .Marland KitchenMarland KitchenMarland KitchenMarland Kitchen 34 units at bedtime daily  Orders: T-Hgb A1C (in-house) [phone]) T- Capillary Blood Glucose (30865) T-Comprehensive Metabolic Panel (78469-62952) T-CBC w/Diff (84132-44010) T-Lipid Profile (27253-66440) T-TSH (34742-59563) T-Urine Microalbumin w/creat. ratio [phone] / [id number])  Labs Reviewed: HgBA1c: 7.1 (02/25/2008)   Creat: 0.85 (03/13/2007)      Problem # 2:  HYPERTENSION (ICD-401.9) She has been out of her meds for 3 days. Will recheck at next visit.  Her updated medication list for this problem includes:    Toprol Xl 200 Mg Tb24 (Metoprolol succinate) .Marland Kitchen... Take 1 tablet by mouth once a day    Lotensin 40 Mg Tabs (Benazepril hcl) .Marland Kitchen... 1 by mouth daily    Hydrochlorothiazide 25 Mg Tabs (Hydrochlorothiazide) .Marland Kitchen... 1 by mouth daily  BP today: 152/100 Prior BP: 133/91 (05/02/2007)  Labs Reviewed: Creat: 0.85  (03/13/2007)   Problem # 3:  GERD (ICD-530.81) Refilled PPI. No symptoms.  Her updated medication list for this problem includes:    Protonix 40 Mg Tbec (Pantoprazole sodium) .Marland Kitchen... Take 1 tablet by mouth once a day  Diagnostics Reviewed:  Discussed lifestyle modifications, diet, antacids/medications, and preventive measures. Handout provided.   Problem # 4:  KNEE PAIN, BILATERAL (ICD-719.46) Planning to get bilateral knee replacements. Following at [facility]. On opiate contract.  Her updated medication list for this problem includes:    Vicodin Es 7.5-750 Mg Tabs (Hydrocodone-acetaminophen) .Marland Kitchen... Take 1 tablet by mouth every 6 hours as needed for pain   Problem # 5:  ANXIETY STATE NOS (ICD-300.00) Focus of todays visit. Patient is having severe depression/anxiety. Racing thoughts about her son who was killed 6 years ago in [place]. She has never gotten any kind of counselling. She is requesting an increase in her alprazolam-I am not certain if this is due to tolerance or more frequent dosing but either way I am going to require that she be in counselling for treatment of her chronic grief/adjustment disorder/GAD. She is very skeptical about changing medication or starting an SSRI. I have agreed to refill the Xanax three times a day dosing on the condition that she see a counselor, be on contract, use the same pharmacy, and bring in her pill bottles.  Plan: Counseling Xanax Contract >15 minutes spent on counseling  Her updated medication list for this problem includes:    Alprazolam 1 Mg Tb24 (Alprazolam) .Marland Kitchen... Take 1 tablet by mouth three times a day as needed for anxiety  Orders: Social Work Referral (Social )   Problem # 6:  CONTRACEPTIVE MANAGEMENT (ICD-V25.09) Will give depo shot if U preg is negative. She has been on Depo in the past- [facility] the [facility]- she is now insured and it is covered.  Orders: T-Urine Pregnancy (in -house) [phone]) Depo-Provera  150mg  (U0454)   Complete Medication List: 1)  Toprol Xl 200 Mg Tb24 (Metoprolol succinate) .... Take 1 tablet by mouth once a day 2)  Lotensin 40 Mg Tabs (Benazepril hcl) .Marland Kitchen.. 1 by mouth daily 3)  Glucophage 1000 Mg Tabs (Metformin hcl) .Marland Kitchen.. 1 by mouth two times a day 4)  Vicodin Es 7.5-750 Mg Tabs (Hydrocodone-acetaminophen) .... Take 1 tablet by mouth every 6 hours as needed for pain 5)  Calcium 500 Mg Tabs (Calcium) 6)  Glucotrol 5 Mg Tabs (Glipizide) .... Take 1 tablet by mouth two times a day 7)  Hydrochlorothiazide 25 Mg Tabs (Hydrochlorothiazide) .Marland Kitchen.. 1 by mouth daily 8)  Alprazolam 1 Mg Tb24 (Alprazolam) .... Take 1 tablet by mouth three times a day as needed for anxiety 9)  Tussionex Pennkinetic Er 8-10 Mg/32ml Lqcr (Chlorpheniramine-hydrocodone) .... Take 5ml by mouth q 12 hours as needed for cough. 10)  Lantus Solostar 100 Unit/ml Soln (Insulin glargine) .... 34 units at bedtime daily 11)  Bd Ultra-fine Pen Needles 29g X 12.1mm Misc (Insulin pen needle) 12)  Nasonex 50 Mcg/act Susp (Mometasone furoate) .... Use 2 sprays in each nostril daily 13)  Protonix 40 Mg Tbec (Pantoprazole sodium) .... Take 1 tablet by mouth once a day 14)  Lidoderm 5 % Ptch (Lidocaine) .... Apply one-two patches to knee or low back for 12 hours then off for 12 hours. 15)  Voltaren 1 % Gel (Diclofenac sodium) .... Apply 4-8 grams to painful joints up to two times a day as needed and as directed. 16)  Bactrim Ds 800-160 Mg Tabs (Sulfamethoxazole-trimethoprim) .... Take 1 tablet by mouth two times a day for 3 days 17)  Atrovent 0.06 % Soln (Ipratropium bromide) .... 2 puffs in each nostril up to three times a day as needed for nasal congestion. 18)  Fluconazole 150 Mg Tabs (Fluconazole) .... Take 1 tablet by mouth once and may repeat in 1 week if itching/dyscharge persists 19)  Promethazine Hcl 25 Mg Tabs (Promethazine hcl) .... Take 1 tablet by mouth every 6 hours 20)  Vitamin D3 1000 Unit Caps  (Cholecalciferol) .... Take 1 tablet by mouth once a day   Patient Instructions: 1)  Please schedule a follow-up appointment in 2 months-January.   Prescriptions: ALPRAZOLAM 1 MG  TB24 (ALPRAZOLAM) Take 1 tablet by mouth three times a day as needed for anxiety  #90 x 1   Entered and Authorized by:   Julaine Fusi  DO   Signed by:   Julaine Fusi  DO on 02/25/2008   Method used:   Print then Give to Patient   RxID:   [id number] GLUCOTROL 5 MG TABS (GLIPIZIDE) Take 1 tablet by mouth two times a day  #60 x 11   Entered and Authorized by:   Julaine Fusi  DO   Signed by:   Julaine Fusi  DO on 02/25/2008   Method used:   Electronically to        [facility] [address] #[id number]* (retail)       [address] [place], [place]  [address]       Ph: [phone]       Fax: [phone]   RxID:   [id number] VITAMIN D3 1000 UNIT CAPS (CHOLECALCIFEROL) Take 1 tablet by mouth once a day  #30 x 11   Entered and Authorized by:   Waynetta Sandy  Phillips Odor  DO   Signed by:   Julaine Fusi  DO on 02/25/2008   Method used:   Electronically to        [facility] #[id number]* (retail)       [address] [place], [place]  [address]       Ph: [phone]       Fax: [phone]   RxID:   [id number] TOPROL XL 200 MG TB24 (METOPROLOL SUCCINATE) Take 1 tablet by mouth once a day  #30 x 6   Entered and Authorized by:   Julaine Fusi  DO   Signed by:   Julaine Fusi  DO on 02/25/2008   Method used:   Electronically to        [facility] #[id number]* (retail)       [address] [place], [place]  [address]       Ph: [phone]       Fax: [phone]   RxID:   [id number]  ] Laboratory Results   Blood Tests   Date/Time Received: February 25, 2008 2:36 PM  Date/Time Reported: Alric Quan  February 25, 2008 2:36 PM   HGBA1C: 7.1%   (Normal Range: Non-Diabetic - 3-6%   Control Diabetic - 6-8%) CBG Random:: 135mg /dL     Appended Document: Lab  Order    Lab Visit   Laboratory Results   Urine Tests  Date/Time Received: February 25, 2008 3:56 PM. Date/Time Reported: Alric Quan  February 25, 2008 3:56 PM     Urine HCG: negative Comments: urine specific gravity 1.030     Alric Quan  February 25, 2008 3:56 PM     Orders Today: Depo-Provera 150mg  [J1055]    Medication Administration  Injection # 1:    Medication: Depo-Provera 150mg     Diagnosis: CONTRACEPTIVE MANAGEMENT (ICD-V25.09)    Route: IM    Site: LUOQ gluteus    Exp Date: 07/13/2009    Lot #: [id number]    Mfr: Pharmacia    Patient tolerated injection without complications    Given by: Krystal Eaton Duncan Dull) (February 25, 2008 5:01 PM)  Orders Added: 1)  Depo-Provera 150mg  [J1055]

## 2010-05-11 NOTE — Progress Notes (Signed)
Summary: After hours request for anxiety meds  Phone Note Call from Patient   Summary of Call: Received call from the patient. Patient is requesting for a refill on alprazolam. But by looking at her active medications, she is not on them anymore. I reported her that I cannot refill a medicine if it is not actve on the med list. I advised her to go to the emergency if her anxiety is bothering her. Patient voices understanding.

## 2010-05-11 NOTE — Progress Notes (Signed)
Summary: refill/ hla  Phone Note Refill Request Message from:  Patient on February 06, 2009 12:01 PM  Refills Requested: Medication #1:  DIFLUCAN 150 MG TABS Take 1 tablet by mouth once a day Initial call taken by: Marin Roberts RN,  February 06, 2009 12:01 PM    Prescriptions: DIFLUCAN 150 MG TABS (FLUCONAZOLE) Take 1 tablet by mouth once a day  #2 x 2   Entered and Authorized by:   Zoila Shutter MD   Signed by:   Zoila Shutter MD on 02/06/2009   Method used:   Electronically to        [facility] [id number]* (retail)       [address]       [address]       [place], [place]  [address]       Ph: [phone]       Fax: [phone]   RxID:   [id number]

## 2010-05-11 NOTE — Progress Notes (Signed)
Summary: refill/ hla  Phone Note Refill Request Message from:  Patient on Aug 12, 2008 2:33 PM  Refills Requested: Medication #1:  VICODIN ES 7.5-750 MG TABS Take 1 tablet by mouth every 6 hours as needed for pain   Last Refilled: 4/9 Initial call taken by: Marin Roberts RN,  Aug 12, 2008 2:34 PM  Follow-up for Phone Call        Refill approved-nurse to complete Follow-up by: Julaine Fusi  DO,  Aug 13, 2008 7:29 AM  Additional Follow-up for Phone Call Additional follow up Details #1::        Rx called to pharmacy Additional Follow-up by: Merrie Roof RN,  Aug 13, 2008 8:51 AM      Prescriptions: VICODIN ES 7.5-750 MG TABS (HYDROCODONE-ACETAMINOPHEN) Take 1 tablet by mouth every 6 hours as needed for pain  #120 x 2   Entered and Authorized by:   Julaine Fusi  DO   Signed by:   Julaine Fusi  DO on 08/13/2008   Method used:   Telephoned to ...       [facility] [phone]* (retail)       [address]       [address]       [place], [place]  [address]       Ph: [phone]       Fax: [phone]   RxID:   [id number]

## 2010-05-11 NOTE — Miscellaneous (Signed)
Summary: refill/gg  Clinical Lists Changes CANCEL SCRIPT. Medications: Rx of CLONAZEPAM 1 MG TABS (CLONAZEPAM) Take 1 tablet by mouth two times a day;  #60 x 0;  Signed;  Entered by: Julaine Fusi  DO;  Authorized by: Julaine Fusi  DO;  Method used: Telephoned to [facility] #[id number]*, [address], [place], [place]  [address], Ph: [phone], Fax: [phone]    Prescriptions: CLONAZEPAM 1 MG TABS (CLONAZEPAM) Take 1 tablet by mouth two times a day  #60 x 0   Entered and Authorized by:   Julaine Fusi  DO   Signed by:   Julaine Fusi  DO on 06/17/2009   Method used:   Telephoned to ...       [facility] #[id number]* (retail)       [address] [place], [place]  [address]       Ph: [phone]       Fax: [phone]   RxID:   [id number]  I have NOT called this med in. I will hold for Dr Phillips Odor to review as pt received lorazapam at last visit 06/16/09 Merrie Roof RN  June 18, 2009 10:03 AM

## 2010-05-11 NOTE — Progress Notes (Signed)
Summary: hctz/ hla  Phone Note Call from Patient   Summary of Call: pt called to verify that she did have script sent on HCTZ, called pharm, verified, informed pt Initial call taken by: Marin Roberts RN,  November 26, 2009 5:12 PM

## 2010-05-11 NOTE — Progress Notes (Signed)
  Phone Note Call from Patient   Caller: Patient Reason for Call: Talk to Doctor Summary of Call: Pt called to report an episode of hypoglycemia on 08/15/07. Pt noted a CBG of  72(not symptomatic)and then an hour later it was 25. She ate a glucose pill and her CBG came upto 177. She says she has not been eating in a timely fashion 2/2 to home stressors. She was concerned about the low CBGs and asked for further advice. She is on Lantus 34 units at bedtime, Glucophage 1000mg  two times a day and Glipizide 5mg  two times a day. I advised her that if she is not eating proper meals, then she can hold glipizide that day since it can lead to hypoglycemic events. The rest can be continued at current doses. Pt was also informed to schedule an [facility] appointment if her CBGs are persistently low. She does not need an urgent appointment now.  Initial call taken by: Yetta Barre MD,  Aug 15, 2007 7:59 PM

## 2010-05-11 NOTE — Progress Notes (Signed)
Summary: Refill/gh  Phone Note Refill Request Message from:  Fax from Pharmacy on June 30, 2008 11:23 AM  Refills Requested: Medication #1:  ALPRAZOLAM 1 MG  TB24 Take 1 tablet by mouth three times a day as needed for anxiety   Last Refilled: 06/12/2008  Method Requested: Electronic Initial call taken by: Angelina Ok RN,  June 30, 2008 11:24 AM  Follow-up for Phone Call        I am going to give her a one month supply #90 with no refills. She has to see me in the office for the next refill. Please call this in - thanks! -BG Follow-up by: Julaine Fusi  DO,  July 01, 2008 11:43 AM  Additional Follow-up for Phone Call Additional follow up Details #1::        Rx faxed to pharmacy.  Prescription was faxed to the [facility] on [facility] per pt request. at [phone]. Additional Follow-up by: Angelina Ok RN,  July 01, 2008 3:35 PM      Prescriptions: ALPRAZOLAM 1 MG  TB24 (ALPRAZOLAM) Take 1 tablet by mouth three times a day as needed for anxiety  #90 x 0   Entered and Authorized by:   Julaine Fusi  DO   Signed by:   Julaine Fusi  DO on 07/01/2008   Method used:   Telephoned to ...       [facility] #[id number]* (retail)       [address]       [place], [place]  [address]       Ph: [phone]       Fax: [phone]   RxID:   [id number]

## 2010-05-11 NOTE — Medication Information (Signed)
Summary:  [facility] QUERY REPORT   [facility] QUERY REPORT   Imported By: Shon Hough 11/10/2009 14:06:08  _____________________________________________________________________  External Attachment:    Type:   Image     Comment:   External Document

## 2010-05-11 NOTE — Progress Notes (Signed)
  Phone Note Call from Patient   Caller: Patient Reason for Call: Acute Illness Details for Reason: Congestion Summary of Call: Patient called  about nasal congestion, yellowish  nasal discharge and headache  for the last few days. Has had allergy chronically. No relief with tylenolol and also a low grade fever. I indicated that she needed to be seen. Offered  for her to be seen  in the clinic but she said she would visit urgent care as she has a 8-5 job.

## 2010-05-11 NOTE — Letter (Signed)
Summary:  Multimedia programmer. Medical Report   Commercial Driver's Lic. Medical Report   Imported By: Florinda Marker 05/07/2007 11:52:58  _____________________________________________________________________  External Attachment:    Type:   Image     Comment:   External Document

## 2010-05-11 NOTE — Medication Information (Signed)
Summary: Tax adviser   Imported By: Florinda Marker 01/12/2007 14:47:41  _____________________________________________________________________  External Attachment:    Type:   Image     Comment:   External Document

## 2010-05-11 NOTE — Assessment & Plan Note (Signed)
Summary: OVERBOOK PER DR Phillips Odor TO FILL DOT/PAPERWORK OUT/CFB   Vital Signs:  Patient Profile:   50 Years Old Female Height:     69 inches (175.26 cm) Weight:      378.04 pounds (171.84 kg) BMI:     56.03 Temp:     98.9 degrees F (37.17 degrees C) oral Pulse rate:   86 / minute BP sitting:   136 / 87  (right arm)  Pt. in pain?   yes    Location:   knee    Intensity:   7    Type:       aching  Vitals Entered By: Angelina Ok RN (March 20, 2007 10:15 AM)              Is Patient Diabetic? Yes  Nutritional Status BMI of > 30 = obese CBG Result 178  Have you ever been in a relationship where you felt threatened, hurt or afraid?No   Does patient need assistance? Functional Status Self care Ambulation Normal     Chief Complaint:  Check up.  History of Present Illness: Patient returns for full H&P. Required for employment with [employer].  Current Allergies: ! DARVOCET  Past Medical History:    Reviewed history from 12/28/2006 and no changes required:       Hypertension       Diabetes mellitus, type II       Hyperlipidemia       Obesity       h/o dysfunctional uterine bleeding       allergic rhinitis       s/p tubal lugation       Anxiety       h/o chest pain w neg cardiolite in 2005   Family History:    Reviewed history and no changes required:       1st degree relatives with:       Obesity       Diabetes       Hypertension       CAD  Social History:    Reviewed history and no changes required:       Married.       No ETOH.       No Tobacco.       No Drugs.   Risk Factors: Tobacco use:  never Alcohol use:  no Exercise:  no Seatbelt use:  100 %  Mammogram History:    Date of Last Mammogram:  01/04/2006  PAP Smear History:    Date of Last PAP Smear:  01/04/2006   Review of Systems  The patient denies anorexia, fever, weight loss, vision loss, decreased hearing, hoarseness, chest pain, syncope, dyspnea on exhertion, peripheral edema, prolonged  cough, hemoptysis, abdominal pain, melena, hematochezia, severe indigestion/heartburn, hematuria, incontinence, genital sores, muscle weakness, suspicious skin lesions, transient blindness, depression, unusual weight change, abnormal bleeding, enlarged lymph nodes, angioedema, and breast masses.         Complains of knee pain worse with walking.   Physical Exam  General:     Well-developed,well-nourished,in no acute distress; alert,appropriate and cooperative throughout examination, overweight.overweight-appearing.   Head:     Normocephalic and atraumatic without obvious abnormalities. No apparent alopecia or balding. Eyes:     No corneal or conjunctival inflammation noted. EOMI. Perrla. Funduscopic exam benign, without hemorrhages, exudates or papilledema. Vision grossly normal. Ears:     External ear exam shows no significant lesions or deformities.  Otoscopic examination reveals clear canals, tympanic membranes are  intact bilaterally without bulging, retraction, inflammation or discharge. Hearing is grossly normal bilaterally. Nose:     External nasal examination shows no deformity or inflammation. Nasal mucosa are pink and moist without lesions or exudates. Mouth:     Oral mucosa and oropharynx without lesions or exudates. . Neck:     No deformities, masses, or tenderness noted. Chest Wall:     No deformities, masses, or tenderness noted. Lungs:     Normal respiratory effort, chest expands symmetrically. Lungs are clear to auscultation, no crackles or wheezes. Heart:     Normal rate and regular rhythm. S1 and S2 normal without gallop, murmur, click, rub or other extra sounds. Abdomen:     soft, non-tender, and normal bowel sounds.   Msk:     Pain to palpation Bilateral knees, scars from arthroscopy. Pulses:     Normal Extremities:     Normal Neurologic:     non focal. Skin:     no rashes.   Psych:     normally interactive.      Impression & Recommendations:  Problem  # 1:  MORBID OBESITY (ICD-278.01) Discussed gastric bypass options. Provided support and encouragement.   Problem # 2:  KNEE PAIN, BILATERAL (ICD-719.46) Will Attempt to see if we can get her into the sports med clinic since she is uninsured and cannot afford to see her orthopedist.  Her updated medication list for this problem includes:    Vicodin Es 7.5-750 Mg Tabs (Hydrocodone-acetaminophen) .Marland Kitchen... Take 1 tablet by mouth at bedtime as needed for pain   Problem # 3:  Preventive Health Care (ICD-V70.0) WORK FORMS COMPLETED PLEASE SEE SCANNED COPIES. TOTAL TIME TO COMPLETE: 30-45 min.  UA, vision test, and cbg required on form. Refrerred to previous eye doctor.   Complete Medication List: 1)  Toprol Xl 200 Mg Tb24 (Metoprolol succinate) .Marland Kitchen.. 1 by mouth daily 2)  Lotensin 40 Mg Tabs (Benazepril hcl) .Marland Kitchen.. 1 by mouth daily 3)  Glucophage 1000 Mg Tabs (Metformin hcl) .Marland Kitchen.. 1 by mouth two times a day 4)  Vicodin Es 7.5-750 Mg Tabs (Hydrocodone-acetaminophen) .... Take 1 tablet by mouth at bedtime as needed for pain 5)  Calcium 500 Mg Tabs (Calcium) 6)  Glucotrol 5 Mg Tabs (Glipizide) .... Take 1 tablet by mouth two times a day 7)  Hydrochlorothiazide 25 Mg Tabs (Hydrochlorothiazide) .Marland Kitchen.. 1 by mouth daily 8)  Alprazolam 1 Mg Tb24 (Alprazolam) .... Take 1 tablet by mouth once a day at bedtime 9)  Tussionex Pennkinetic Er 8-10 Mg/68ml Lqcr (Chlorpheniramine-hydrocodone) .... Take 5ml by mouth q 12 hours as needed for cough. 10)  Lantus Solostar 100 Unit/ml Soln (Insulin glargine) .... 34 units at bedtime daily 11)  Bd Ultra-fine Pen Needles 29g X 12.38mm Misc (Insulin pen needle) 12)  Nasonex 50 Mcg/act Susp (Mometasone furoate) .... Use 2 sprays in each nostril daily 13)  Protonix 40 Mg Tbec (Pantoprazole sodium) .... Take 1 tablet by mouth once a day 14)  Lidoderm 5 % Ptch (Lidocaine) .... Apply one-two patches to knee or low back for 12 hours then off for 12 hours. 15)  Voltaren 1 % Gel  (Diclofenac sodium) .... Apply 4-8 grams to painful joints up to two times a day as needed and as directed.  Other Orders: Capillary Blood Glucose (40981) Fingerstick (19147) Depo-Provera 150mg  (J1055) Admin of Therapeutic Inj  intramuscular or subcutaneous (82956) T-Culture, Urine ([id number])   Patient Instructions: 1)  Please schedule a follow-up appointment in 2 months.    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 Patient Level IV [16109] 5)  Admin of Therapeutic Inj  intramuscular or subcutaneous [90772] 6)  T-Culture, Urine [[id number]]   Laboratory Results   Urine Tests  Date/Time Recieved: 03/20/07    11:28 AM Date/Time Reported: 03/20/07  11:31AM  Routine Urinalysis   Color: yellow Appearance: Hazy Glucose: negative   (Normal Range: Negative) Bilirubin: negative   (Normal Range: Negative) Ketone: trace (5)   (Normal Range: Negative) Spec. Gravity: >=1.030   (Normal Range: 1.003-1.035) Blood: large   (Normal Range: Negative) pH: 5.0   (Normal Range: 5.0-8.0) Protein: 30   (Normal Range: Negative) Urobilinogen: 0.2   (Normal Range: 0-1) Nitrite: positive   (Normal Range: Negative) Leukocyte Esterace: negative   (Normal Range: Negative)     Blood Tests     CBG Random: 178

## 2010-05-11 NOTE — Assessment & Plan Note (Signed)
Summary: EST-CK/FU/MEDS/CFB   Vital Signs:  Patient profile:   50 year old female Height:      69 inches (175.26 cm) Weight:      358.02 pounds (162.74 kg) BMI:     53.06 Temp:     97.2 degrees F (36.22 degrees C) oral Pulse rate:   80 / minute BP sitting:   139 / 90  (right arm)  Vitals Entered By: Angelina Ok RN (Aug 26, 2008 3:22 PM) Is Patient Diabetic? No Pain Assessment Patient in pain? yes     Location: foot/rt Intensity: 9 Type: aching, sharp pain in knees unable top bear weight Onset of pain  Constant Nutritional Status BMI of > 30 = obese  Have you ever been in a relationship where you felt threatened, hurt or afraid?No   Does patient need assistance? Functional Status Self care Ambulation Normal Comments Needs forms for Gastric Bypass Surgery.  Wants a referral to a Podiatrist.   Foot pain.   History of Present Illness: Maria [name] comes in today for follow-up on multiple issues. She brings in a large amount of paper work to be completed for her gastric bypass surgery. She also continues to have issues with chronic pain. Bilateral knee OA worse and back pain.  Allergies: 1)  ! Darvocet   Pain Management    This patient will have ongoing requirements for medication to manage chronic pain.  A pain management contract will be developed including a narcotic contract.  This patient has a current pain management plan and presents today for reevaluation.  Will need to implement a strict pain med contract. Reason for Long Term Pain Management:PAIN MANAGEMENT CONTRACT (09/01/2008),  Max Pain Intensity in past month. 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 She now has associated achilles tendon pain- likely made worse by her knee OA and compensation with abnormal gait. Minimal relief with Vicodin- will increase to percocet today and monitor very closely her opiate needs- I discuss the implications of risk moving toward long term opiate pain control.  Problem # 2:  OBSTRUCTIVE SLEEP APNEA (ICD-327.23) did not show for her sleep study- will discuss these barriers at her next visit.  Problem # 3:  DIABETES MELLITUS, TYPE II (ICD-250.00) Relatively well controlled. No intervention today- she will likely do well with gastric bypass surgery for weight loss but will need nutritional suppport - will see if Lupita Leash can help her with this.  Her updated medication list for this problem includes:    Lotensin 40 Mg Tabs (Benazepril hcl) .Marland Kitchen... 1 by mouth daily    Glucophage 1000 Mg Tabs (Metformin hcl) .Marland Kitchen... 1 by mouth two times a day    Glucotrol 5 Mg Tabs (Glipizide) .Marland Kitchen... Take 1 tablet by mouth two times a day    Lantus Solostar 100 Unit/ml Soln (Insulin glargine) .Marland KitchenMarland KitchenMarland KitchenMarland Kitchen 34 units at bedtime daily  Orders:  T-Comprehensive Metabolic Panel [id number]) T-TSH [phone]) T-Lipid Profile [phone]) T-CBC No Diff [phone])  Labs Reviewed: Creat: 1.02 (02/25/2008)    Reviewed HgBA1c results: 7.1 (02/25/2008)  7.1 (03/13/2007)  Problem # 4:  MORBID OBESITY (ICD-278.01) Will complete requested forms and scan.  Orders: 12 Lead EKG (12 Lead EKG) CXR- 2view (CXR)  Problem # 5:  HYPERTENSION (ICD-401.9) Well controlled. No change.  Her updated medication list for this problem includes:    Toprol Xl 200 Mg Tb24 (Metoprolol  succinate) .Marland Kitchen... Take 1 tablet by mouth once a day    Lotensin 40 Mg Tabs (Benazepril hcl) .Marland Kitchen... 1 by mouth daily    Hydrochlorothiazide 25 Mg Tabs (Hydrochlorothiazide) .Marland Kitchen... 1 by mouth daily  BP today: 139/90 Prior BP: 113/68 (06/12/2008)  Labs Reviewed: K+: 4.0 (02/25/2008) Creat: : 1.02 (02/25/2008)   Chol: 156 (02/25/2008)   HDL: 75 (02/25/2008)   LDL: 67 (02/25/2008)   TG: 72 (02/25/2008)  Problem # 6:  ALLERGIC RHINITIS (ICD-477.9) Will try new nasal spray.  The following medications were removed from the medication list:    Nasonex 50 Mcg/act Susp (Mometasone furoate) ..... Use 2 sprays in each nostril daily Her updated medication list for this problem includes:    Atrovent 0.06 % Soln (Ipratropium bromide) .Marland Kitchen... 2 puffs in each nostril up to three times a day as needed for nasal congestion.    Promethazine Hcl 25 Mg Tabs (Promethazine hcl) .Marland Kitchen... Take 1 tablet by mouth every 6 hours    Astelin 137 Mcg/spray Soln (Azelastine hcl) ..... 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  Complete Medication List: 1)  Toprol Xl 200 Mg Tb24 (Metoprolol succinate) .... Take 1 tablet by mouth once a day 2)  Lotensin 40 Mg Tabs (Benazepril hcl) .Marland Kitchen.. 1 by mouth daily 3)  Glucophage 1000 Mg Tabs (Metformin hcl) .Marland Kitchen.. 1 by mouth two times a day 4)  Calcium 500 Mg Tabs (Calcium) 5)  Glucotrol 5 Mg Tabs (Glipizide) .... Take 1 tablet by mouth two times a day 6)  Hydrochlorothiazide 25 Mg Tabs (Hydrochlorothiazide) .Marland Kitchen.. 1 by mouth daily 7)  Alprazolam 1 Mg Tb24 (Alprazolam) .... Take 1 tablet by mouth three times a day as needed for anxiety 8)  Lantus Solostar 100 Unit/ml Soln (Insulin glargine) .... 34 units at bedtime daily 9)  Bd Ultra-fine Pen Needles 29g X 12.28mm Misc (Insulin pen needle) 10)  Protonix 40 Mg Tbec (Pantoprazole sodium) .... Take 1 tablet by mouth once a day 11)   Voltaren 1 % Gel (Diclofenac sodium) .... Apply 4-8 grams to painful joints up to two times a day as needed and as directed. 12)  Atrovent 0.06 % Soln (Ipratropium bromide) .... 2 puffs in each nostril up to three times a day as needed for nasal congestion. 13)  Fluconazole 150 Mg Tabs (Fluconazole) .... Take 1 tablet by mouth once and may repeat in 1 week if itching/dyscharge persists 14)  Promethazine Hcl 25 Mg Tabs (Promethazine hcl) .... Take 1 tablet by mouth every 6 hours 15)  Vitamin D3 1000 Unit Caps (Cholecalciferol) .... Take 1 tablet by mouth once a day 16)  Oxycodone-acetaminophen 10-325 Mg Tabs (Oxycodone-acetaminophen) .... Take 1 tablet by mouth two times a day 17)  Astelin 137 Mcg/spray Soln (Azelastine hcl) .... One spray in each nostril two times a day as directed  Other Orders: Sports Medicine (Sports Med)  Patient  Instructions: 1)  F/U in 1-3 months- skin biopsy 2)  Jamison Neighbor- Nutrition referral/DM/Wt Loss Surgery Planned Prescriptions: ASTELIN 137 MCG/SPRAY SOLN (AZELASTINE HCL) One spray in each nostril two times a day as directed  #1 x 3   Entered and Authorized by:   Julaine Fusi  DO   Signed by:   Julaine Fusi  DO on 08/26/2008   Method used:   Electronically to        [facility] [phone]* (retail)       [address]       [address]       [place], [place]  [address]       Ph: [phone]       Fax: [phone]   RxID:   [id number] OXYCODONE-ACETAMINOPHEN 10-325 MG TABS (OXYCODONE-ACETAMINOPHEN) Take 1 tablet by mouth two times a day  #60 x 0   Entered and Authorized by:   Julaine Fusi  DO   Signed by:   Julaine Fusi  DO on 08/26/2008   Method used:   Print then Give to Patient   RxID:   [id number]

## 2010-05-11 NOTE — Progress Notes (Signed)
Summary: refill/gg   Maria [name]  Phone Note Refill Request  on June 04, 2007 12:56 PM  Refills Requested: Medication #1:  VICODIN ES 7.5-750 MG TABS Take 1 tablet by mouth at bedtime as needed for pain Pt request you write for 2 tabs daily.  Initial call taken by: Merrie Roof RN,  June 04, 2007 12:56 PM  Follow-up for Phone Call        approved. Call to pharmacy. Follow-up by: Julaine Fusi  DO,  June 05, 2007 2:41 PM  Additional Follow-up for Phone Call Additional follow up Details #1::        Rx called to pharmacy Additional Follow-up by: Merrie Roof RN,  June 06, 2007 11:16 AM    New/Updated Medications: VICODIN ES 7.5-750 MG TABS (HYDROCODONE-ACETAMINOPHEN) Take 1 tablet by mouth two times a day   Prescriptions: VICODIN ES 7.5-750 MG TABS (HYDROCODONE-ACETAMINOPHEN) Take 1 tablet by mouth two times a day  #60 x 3   Entered and Authorized by:   Julaine Fusi  DO   Signed by:   Julaine Fusi  DO on 06/05/2007   Method used:   Telephoned to ...       [facility] [address] [phone]*       [address]       [place], [place]  [address]       Ph: [phone] or [phone]       Fax: [phone]   RxID:   [id number]

## 2010-05-11 NOTE — Miscellaneous (Signed)
  Clinical Lists Changes  Orders: Added new Service order of Joint Aspirate / Injection, Large (20610) - Signed Observations: Added new observation of PEADULT: Denny Levy MD ~Additional Exam`PE comments (11/04/2008 14:36) Added new observation of PE COMMENTS: Patient given informed consent for injection. Discussed possible complications of infection, bleeding or skin atrophy at site of injection. Possible side effect of avascular necrosis (focal area of bone death) due to steroid use.Appropriate verbal time out taken Are cleaned and prepped in usual sterile fashion. A --1-- cc kennalog plus --2--cc 1% lidocaine without epinephrine  plus 4 cc bupivicaine was injected into the--right knee-. Patient tolerated procedure well with no complications.  (11/04/2008 14:36)      Complete Medication List: 1)  Toprol Xl 200 Mg Tb24 (Metoprolol succinate) .... Take 1 tablet by mouth once a day 2)  Lotensin 40 Mg Tabs (Benazepril hcl) .Marland Kitchen.. 1 by mouth daily 3)  Glucophage 1000 Mg Tabs (Metformin hcl) .Marland Kitchen.. 1 by mouth two times a day 4)  Calcium 500 Mg Tabs (Calcium) 5)  Glucotrol 5 Mg Tabs (Glipizide) .... Take 1 tablet by mouth two times a day 6)  Hydrochlorothiazide 25 Mg Tabs (Hydrochlorothiazide) .Marland Kitchen.. 1 by mouth daily 7)  Alprazolam 1 Mg Tb24 (Alprazolam) .... Take 1 tablet by mouth three times a day as needed for anxiety 8)  Lantus 100 Unit/ml Soln (Insulin glargine) .... Inject 34 units at bedtime 9)  Bd Ultra-fine Pen Needles 29g X 12.84mm Misc (Insulin pen needle) 10)  Protonix 40 Mg Tbec (Pantoprazole sodium) .... Take 1 tablet by mouth once a day 11)  Voltaren 1 % Gel (Diclofenac sodium) .... Apply 4-8 grams to painful joints up to two times a day as needed and as directed. 12)  Atrovent 0.06 % Soln (Ipratropium bromide) .... 2 puffs in each nostril up to three times a day as needed for nasal congestion. 13)  Fluconazole 150 Mg Tabs (Fluconazole) .... Take 1 tablet by mouth once and may repeat in 1  week if itching/dyscharge persists 14)  Promethazine Hcl 25 Mg Tabs (Promethazine hcl) .... Take 1 tablet by mouth every 6 hours 15)  Vitamin D3 1000 Unit Caps (Cholecalciferol) .... Take 1 tablet by mouth once a day 16)  Oxycodone-acetaminophen 10-325 Mg Tabs (Oxycodone-acetaminophen) .... Take 1 tablet by mouth two times a day 17)  Astelin 137 Mcg/spray Soln (Azelastine hcl) .... One spray in each nostril two times a day as directed 18)  Gabapentin 100 Mg Caps (Gabapentin) .... 2 tablets at night for 2-3 days then two times a day then three times a day then 4 tablets at night 19)  Meloxicam 15 Mg Tabs (Meloxicam) .... One by mouth daily - per dr draper 20)  Fracture Boot Medium  .... Dx: achill tendonitis use as directed. dispense 1 21)  Tb Syringe 1 Ml Misc (Needles & syringes) .... Use as directed 22)  Lantus Solostar 100 Unit/ml Soln (Insulin glargine) .... Inject 34 units subcutaneously at bedtime or as directed by physician   Physical Exam  Additional Exam:  Patient given informed consent for injection. Discussed possible complications of infection, bleeding or skin atrophy at site of injection. Possible side effect of avascular necrosis (focal area of bone death) due to steroid use.Appropriate verbal time out taken Are cleaned and prepped in usual sterile fashion. A --1-- cc kennalog plus --2--cc 1% lidocaine without epinephrine  plus 4 cc bupivicaine was injected into the--right knee-. Patient tolerated procedure well with no complications.

## 2010-05-11 NOTE — Progress Notes (Signed)
Summary: med refill/gp  Phone Note Refill Request Message from:  Patient on December 31, 2008 9:01 AM  Refills Requested: Medication #1:  OXYCODONE-ACETAMINOPHEN 10-325 MG TABS Take 1 tablet by mouth three times a day Pt. states it is not due until 09/27   Method Requested: Pick up at Office Initial call taken by: Chinita Pester RN,  December 31, 2008 9:01 AM  Follow-up for Phone Call        Will give prescription for instructions to not fill until 9/27. Follow-up by: Peggye Pitt MD,  December 31, 2008 10:16 AM    Prescriptions: OXYCODONE-ACETAMINOPHEN 10-325 MG TABS (OXYCODONE-ACETAMINOPHEN) Take 1 tablet by mouth three times a day  #90 x 0   Entered by:   Peggye Pitt MD   Authorized by:   Marland Kitchen [facility] ATTENDING DESKTOP   Signed by:   Peggye Pitt MD on 12/31/2008   Method used:   Print then Give to Patient   RxID:   [id number] OXYCODONE-ACETAMINOPHEN 10-325 MG TABS (OXYCODONE-ACETAMINOPHEN) Take 1 tablet by mouth three times a day  #90 x 0   Entered by:   Peggye Pitt MD   Authorized by:   Marland Kitchen [facility] ATTENDING DESKTOP   Signed by:   Peggye Pitt MD on 12/31/2008   Method used:   Print then Give to Patient   RxID:   [id number]  Only one prescription was given to the patient. The second one printed on regular paper.  Appended Document: med refill/gp    Clinical Lists Changes  Medications: Rx of OXYCODONE-ACETAMINOPHEN 10-325 MG TABS (OXYCODONE-ACETAMINOPHEN) Take 1 tablet by mouth three times a day;  #90 x 0;  Signed;  Entered by: Peggye Pitt MD;  Authorized by: Peggye Pitt MD;  Method used: Print then Give to Patient    Prescriptions: OXYCODONE-ACETAMINOPHEN 10-325 MG TABS (OXYCODONE-ACETAMINOPHEN) Take 1 tablet by mouth three times a day  #90 x 0   Entered and Authorized by:   Peggye Pitt MD   Signed by:   Peggye Pitt MD on 12/31/2008   Method used:   Print then Give to Patient   RxID:   [id number]  Only one  prescription was give to patiet.  Appended Document: med refill/gp Oxycodone will not show up as a positive opiate result on a UDS. Next time she returns a specific urine screen for oxycodone needs to be drawn. In the meantime will refill her percocet.

## 2010-05-11 NOTE — Assessment & Plan Note (Signed)
Summary: L KNEE INJURY,FELL ON 9/13,MC   Vital Signs:  Patient profile:   50 year old female Height:      69 inches (175.26 cm) Weight:      306 pounds (139.09 kg) BMI:     45.35 BP sitting:   152 / 102  Vitals Entered By: Kathi Simpers [facility]) (December 25, 2009 9:16 AM)  Allergies: 1)  ! Darvocet   Complete Medication List: 1)  Toprol Xl 200 Mg Tb24 (Metoprolol succinate) .... Take 1 tablet by mouth once a day 2)  Lotensin 40 Mg Tabs (Benazepril hcl) .Marland Kitchen.. 1 by mouth daily 3)  Glucophage 1000 Mg Tabs (Metformin hcl) .Marland Kitchen.. 1 by mouth two times a day 4)  Glucotrol 5 Mg Tabs (Glipizide) .... Take 1 tablet by mouth two times a day 5)  Lantus Solostar 100 Unit/ml Soln (Insulin glargine) .... Inject 45 units subcutaneously at bedtime 6)  Protonix 40 Mg Tbec (Pantoprazole sodium) .... Take 1 tablet by mouth once a day 7)  Vitamin D3 1000 Unit Caps (Cholecalciferol) .... Take 1 tablet by mouth once a day 8)  Tb Syringe 1 Ml Misc (Needles & syringes) .... Use as directed 9)  Carisoprodol 350 Mg Tabs (Carisoprodol) .... One by mouth three times a day 10)  Hydrochlorothiazide 25 Mg Tabs (Hydrochlorothiazide) .... Take 1 tablet by mouth every morning 11)  Fluoxetine Hcl 40 Mg Caps (Fluoxetine hcl) .... Take 1 tablet by mouth once a day 12)  Gabapentin 300 Mg Caps (Gabapentin) .... Take 1 tablet by mouth three times a day 13)  Dibucaine 1 % Oint (Dibucaine) .... Apply to hemorrhoid up to 4 times daily as needed 14)  Trazodone Hcl 150 Mg Tabs (Trazodone hcl) .... Start one tab by mouth at bedtime for 3 days then increase to 2 tablets po at bedtime 15)  Indomethacin 50 Mg Caps (Indomethacin) .... Take 1 tab bye mouth every 12hrs as needed for pain. 16)  Colcrys 0.6 Mg Tabs (Colchicine) .... Take one tab by mouth two times a day for 5 days, then 1 tab daily. 17)  Bd Pen Needle Short U/f 31g X 8 Mm Misc (Insulin pen needle) .... Use as directed once daily with lantus solostar pen 18)  Ibuprofen 800  Mg Tabs (Ibuprofen) .Marland Kitchen.. 1 by mouth two times a day as needed knee pain (do not take with indomethacin)

## 2010-05-11 NOTE — Assessment & Plan Note (Signed)
Summary: PER GLADYS/ SB.   Vital Signs:  Patient Profile:   50 Years Old Female Height:     69 inches (175.26 cm) Weight:      383.0 pounds (174.09 kg) BMI:     56.76 Temp:     97.8 degrees F (36.56 degrees C) oral Pulse rate:   85 / minute BP sitting:   133 / 91  (right arm) Cuff size:   large  Pt. in pain?   yes    Location:   R KNEE    Intensity:   7  Vitals Entered By: Theotis Barrio (May 02, 2007 3:36 PM)              Is Patient Diabetic? Yes  Nutritional Status NORMAL  Have you ever been in a relationship where you felt threatened, hurt or afraid?No   Does patient need assistance? Functional Status Self care Ambulation Normal     Chief Complaint:  MEDICATION REFILL / SINUS -THICK GREEN -SORE THROAT.  History of Present Illness: Kandee comes in today complaining of upper respiratory symptoms of congestion, cough, and sore throat. Symptoms present for 1 week. She is prone to allergies and sinusitis. She reports having trouble breathing at night- was told that she has sleep apnea but has never gone to get a CPAP fitted or titrated. She denies fever, facial pain, or productive discolored sputum. As far as her other problems she continues to have debilatating knee pain and is planning on having knee replacement when her benfits take effect in a few months.   Current Allergies: ! DARVOCET    Risk Factors: Tobacco use:  never Alcohol use:  no Exercise:  no Seatbelt use:  100 %  Mammogram History:    Date of Last Mammogram:  01/04/2006  PAP Smear History:    Date of Last PAP Smear:  01/04/2006   Review of Systems  The patient denies anorexia, fever, vision loss, hoarseness, chest pain, dyspnea on exhertion, and hemoptysis.     Physical Exam  General:     No distress. Head:     no abnormalities observed.   Eyes:     no injection.   Ears:     no external deformities.   Nose:     inflammed mucosa Mouth:     clear, throat inflammed, post  nasal yellow drainage Neck:     supple and full ROM.   Chest Wall:     nontender Lungs:     CTAB Heart:     RRR Abdomen:     soft and non-tender.   Msk:     Right knee tender to palpation along the joint line. Severely limited ROM in bilateral knees Pulses:     equal Extremities:     no edema    Impression & Recommendations:  Problem # 1:  KNEE PAIN, BILATERAL (ICD-719.46) Needs steroid injectioion at next visit. She does not currently have health insurance and cannot afford to go to her orthopedist at this time. We will try to help her here in this clinic with corticosteriod injection. She has has previous injections- none in teh past year and historically they have helped her tremendously. She has Severe Degenerative Knee Joint Disase so we are just trying to give her time and relief until she has a replacement. I also encouraged weightloss. Patient is almost non-weight bearing and debilatated from knee pain- much worse than I have seen her in the past.  Dr.Murphy is to see her in  a few months when she has insurance established.  Opiates are appopriate in this setting for pain control. I will also get plain films of her knees before proceeding with injection.  Her updated medication list for this problem includes:    Vicodin Es 7.5-750 Mg Tabs (Hydrocodone-acetaminophen) .Marland Kitchen... Take 1 tablet by mouth at bedtime as needed for pain  Orders: Diagnostic X-Ray/Fluoroscopy (Diagnostic X-Ray/Flu)   Problem # 2:  URINARY TRACT INFECTION, RECURRENT (ICD-599.0) Last UA w/ Proteus. Treated with Levaquin for 3 days. Symptoms resolved- still w/frequency-incontinence. Await results.  Her updated medication list for this problem includes:    Bactrim Ds 800-160 Mg Tabs (Sulfamethoxazole-trimethoprim) .Marland Kitchen... Take 1 tablet by mouth two times a day for 3 days  Orders: T-Culture, Urine ([id number])   Problem # 3:  UPPER RESPIRATORY INFECTION (ICD-465.9) Sinus congestion. Likely a  combination of allergic and viral rhinitis. I instructed her on twice daily saline sinus flushes. Will give her atrovent nasal inhaler for possible vasomotor component. F/U in 2 weeks. She has failed treatment with steroid nasal spray in the past. If not improved or she develops fever will consider treatment with antibiotics, but for now symptom management is key.  Her updated medication list for this problem includes:    Tussionex Pennkinetic Er 8-10 Mg/72ml Lqcr (Chlorpheniramine-hydrocodone) .Marland Kitchen... Take 5ml by mouth q 12 hours as needed for cough.    Orders: T-Urinalysis ([id number])   Problem # 4:  OBSTRUCTIVE SLEEP APNEA (ICD-327.23) Need to send her for CPAP titration at next visit.  Complete Medication List: 1)  Toprol Xl 200 Mg Tb24 (Metoprolol succinate) .Marland Kitchen.. 1 by mouth daily 2)  Lotensin 40 Mg Tabs (Benazepril hcl) .Marland Kitchen.. 1 by mouth daily 3)  Glucophage 1000 Mg Tabs (Metformin hcl) .Marland Kitchen.. 1 by mouth two times a day 4)  Vicodin Es 7.5-750 Mg Tabs (Hydrocodone-acetaminophen) .... Take 1 tablet by mouth at bedtime as needed for pain 5)  Calcium 500 Mg Tabs (Calcium) 6)  Glucotrol 5 Mg Tabs (Glipizide) .... Take 1 tablet by mouth two times a day 7)  Hydrochlorothiazide 25 Mg Tabs (Hydrochlorothiazide) .Marland Kitchen.. 1 by mouth daily 8)  Alprazolam 1 Mg Tb24 (Alprazolam) .... Take 1 tablet by mouth once a day at bedtime 9)  Tussionex Pennkinetic Er 8-10 Mg/17ml Lqcr (Chlorpheniramine-hydrocodone) .... Take 5ml by mouth q 12 hours as needed for cough. 10)  Lantus Solostar 100 Unit/ml Soln (Insulin glargine) .... 34 units at bedtime daily 11)  Bd Ultra-fine Pen Needles 29g X 12.67mm Misc (Insulin pen needle) 12)  Nasonex 50 Mcg/act Susp (Mometasone furoate) .... Use 2 sprays in each nostril daily 13)  Protonix 40 Mg Tbec (Pantoprazole sodium) .... Take 1 tablet by mouth once a day 14)  Lidoderm 5 % Ptch (Lidocaine) .... Apply one-two patches to knee or low back for 12 hours then off for 12 hours. 15)   Voltaren 1 % Gel (Diclofenac sodium) .... Apply 4-8 grams to painful joints up to two times a day as needed and as directed. 16)  Bactrim Ds 800-160 Mg Tabs (Sulfamethoxazole-trimethoprim) .... Take 1 tablet by mouth two times a day for 3 days 17)  Atrovent 0.06 % Soln (Ipratropium bromide) .... 2 puffs in each nostril up to three times a day as needed for nasal congestion.   Patient Instructions: 1)  Use a saline nose spray severaltimes a day to keep nasal passages clear."Simply Saline" is a good brand w/ aerosol can.  2)  Try "Sinus genie" , nasal flushing at bedtime daily.  3)  Use atrovent nasal inhaler twice daily. 4)  Bring pill bottles to next visit.    Prescriptions: VICODIN ES 7.5-750 MG TABS (HYDROCODONE-ACETAMINOPHEN) Take 1 tablet by mouth at bedtime as needed for pain  #60 x 3   Entered and Authorized by:   Julaine Fusi  DO   Signed by:   Julaine Fusi  DO on 05/02/2007   Method used:   Print then Give to Patient   RxID:   [id number] ATROVENT 0.06 %  SOLN (IPRATROPIUM BROMIDE) 2 puffs in each nostril up to three times a day as needed for nasal congestion.  #9ml x 3   Entered and Authorized by:   Julaine Fusi  DO   Signed by:   Julaine Fusi  DO on 05/02/2007   Method used:   Print then Give to Patient   RxID:   [id number]  ]

## 2010-05-11 NOTE — Medication Information (Signed)
Summary: RX History: [facility]  RX History: [facility]   Imported By: Florinda Marker 09/09/2008 15:50:49  _____________________________________________________________________  External Attachment:    Type:   Image     Comment:   External Document

## 2010-05-11 NOTE — Progress Notes (Signed)
Summary: refill/ hla  Phone Note Refill Request Message from:  Patient on November 22, 2007 4:41 PM  Refills Requested: Medication #1:  VICODIN ES 7.5-750 MG TABS Take 1 tablet by mouth two times a day   Last Refilled: 7/10  Medication #2:  TOPROL XL 200 MG TB24 1 by mouth daily pt has been receiving a varying amt of pain meds due to her health problems from multiple prescribers, after speaking w/dr Francesa Eugenio i spoke w/ pt she understands she may use 1 pharm, she must be seen by dr Phillips Odor in the near future and the amt of #160 may decrease.  Initial call taken by: Marin Roberts RN,  November 22, 2007 4:41 PM  Follow-up for Phone Call        [facility] to call in Follow-up by: Julaine Fusi  DO,  November 23, 2007 2:56 PM      Prescriptions: TOPROL XL 200 MG TB24 (METOPROLOL SUCCINATE) 1 by mouth daily  #30 x 11   Entered and Authorized by:   Julaine Fusi  DO   Signed by:   Julaine Fusi  DO on 11/23/2007   Method used:   Electronically sent to ...       [facility] [address] [phone][facility]       [address]       [place], [place]  [address]       Ph: [phone] or [phone]       Fax: [phone]   RxID:   [id number] VICODIN ES 7.5-750 MG TABS (HYDROCODONE-ACETAMINOPHEN) Take 1 tablet by mouth two times a day  #120 x 0   Entered and Authorized by:   Julaine Fusi  DO   Signed by:   Julaine Fusi  DO on 11/23/2007   Method used:   Telephoned to ...       [facility] [address] [phone]*       [address]       [place], [place]  [address]       Ph: [phone] or [phone]       Fax: [phone]   RxID:   [id number] TOPROL XL 200 MG TB24 (METOPROLOL SUCCINATE) 1 by mouth daily  #0 x 0   Entered and Authorized by:   Julaine Fusi  DO   Signed by:   Julaine Fusi  DO on 11/23/2007   Method used:   Telephoned to ...       [facility] [address] [phone]*       [address]       [place], [place]  [address]       Ph: [phone] or [phone]       Fax: [phone]   RxID:   [id number]    Appended Document: refill/ hla Above prescription changed to Vicodin ES 7.5-750 mg tablets 1 by mouth every 6 hours as needed for pain # 120 no refills per order of Dr. Phillips Odor.  Need to change Toprol to plain Metatoprol as done per pharmacy with ok from Dr. Phillips Odor. Angelina Ok, RN November 23, 2007 3:57 PM

## 2010-05-11 NOTE — Assessment & Plan Note (Signed)
Summary: OP PT - ACHILLES PAIN/MJD   History of Present Illness: Pt referred by [facility] OPC: right heel pain  50yr old morbidly obese AAF presents as referral for second opinion/consult from [facility] (Dr Phillips Odor).   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  Has been ultrasounded and xrayed showing bone spur only.  No acute injury. She was going to PT without much benefit.  Also started on Mobic x 3m and she reports no relief. Is in the process of getting established with pain clinic for R knee pain.  Unable to do surgery on R knee due to weight per ortho.    Today she reports post heel pain x 37m that is worsening to the point where she can no longer place her heel on the floor when she walks. Has even started using a cane for assistance.   Pain is 8-10/10, aching, sometimes aches up into her distal calf. Worst with plantar flexion weight bearing(full)she no longer walks on her heel. Has decreased her activities to only absolutely necessary.  PMH pertinent for B DJD knees, bone on bone in right, s/p arthroscope left. No specific heel or achilles injury, no foot or ankle surgeries.  Allergies (verified): 1)  ! Darvocet  Physical Exam  General:  very pleasant morbidly obese AAF Msk:  R ankle: - ttp over post calcaneus at achilles insertion worse of lateral side - ttp along talar-fibular ligament - mild pain with dorsiflexion at post heel - no significant edema or skin changes Achilles is tender in weight bearing plantar flexion but only mildly tender in dorsiflexion. No defect is felt. Distally she is neurovascularly intact Gait: - very stooped posture using cane - will not heel strike at all on R foot, walks on toe only . Foot plant: severe B pes planus with mid foot collapse on mid stance phase B.    Additional Exam:  Ultrasound R achilles:  Relatively normal appearing achilles tendon not  significantly different in size from left side. No defects noted.Several small areas of calcification are noted.. Calcaneal bone spur visualized. Minimal to no edema noted in achilles.    Impression & Recommendations:  Problem # 1:  ACHILLES TENDINITIS (ICD-726.71) Assessment Unchanged Chronic problem likely mechanical due to severe OA in R knee with gait disturbance.  CAM boot x 1 week and whirlpool therapy. Start gabapentin (see instructions) and continue the NSAID. FU one week for improvement and likely temp orthotics to help with gait disturbance.   Orders: Physical Therapy Referral (PT) US EXTREMITY NON-VASC REAL-TIME IMG ([address])  Complete Medication List: 1)  Toprol Xl 200 Mg Tb24 (Metoprolol succinate) .... Take 1 tablet by mouth once a day 2)  Lotensin 40 Mg Tabs (Benazepril hcl) .Marland Kitchen.. 1 by mouth daily 3)  Glucophage 1000 Mg Tabs (Metformin hcl) .Marland Kitchen.. 1 by mouth two times a day 4)  Calcium 500 Mg Tabs (Calcium) 5)  Glucotrol 5 Mg Tabs (Glipizide) .... Take 1 tablet by mouth two times a day 6)  Hydrochlorothiazide 25 Mg Tabs (Hydrochlorothiazide) .Marland Kitchen.. 1 by mouth daily 7)  Alprazolam 1 Mg Tb24 (Alprazolam) .... Take 1 tablet by mouth three times a day as needed for anxiety 8)  Lantus Solostar 100 Unit/ml Soln (Insulin glargine) .... 34 units at bedtime daily 9)  Bd Ultra-fine Pen Needles 29g X 12.53mm Misc (Insulin pen needle) 10)  Protonix 40 Mg  Tbec (Pantoprazole sodium) .... Take 1 tablet by mouth once a day 11)  Voltaren 1 % Gel (Diclofenac sodium) .... Apply 4-8 grams to painful joints up to two times a day as needed and as directed. 12)  Atrovent 0.06 % Soln (Ipratropium bromide) .... 2 puffs in each nostril up to three times a day as needed for nasal congestion. 13)  Fluconazole 150 Mg Tabs (Fluconazole) .... Take 1 tablet by mouth once and may repeat in 1 week if itching/dyscharge persists 14)  Promethazine Hcl 25 Mg Tabs (Promethazine hcl) .... Take 1 tablet by mouth every 6  hours 15)  Vitamin D3 1000 Unit Caps (Cholecalciferol) .... Take 1 tablet by mouth once a day 16)  Oxycodone-acetaminophen 10-325 Mg Tabs (Oxycodone-acetaminophen) .... Take 1 tablet by mouth two times a day 17)  Astelin 137 Mcg/spray Soln (Azelastine hcl) .... One spray in each nostril two times a day as directed 18)  Gabapentin 100 Mg Caps (Gabapentin) .... 2 tablets at night for 2-3 days then two times a day then three times a day then 4 tablets at night 19)  Meloxicam 15 Mg Tabs (Meloxicam) .... One by mouth daily - per dr draper 20)  Fracture Boot Medium  .... Dx: achill tendonitis use as directed. dispense 1  Patient Instructions: 1)  Wear the boot for the next week whenever you are walking. You do not have to sleep in it.  You can get the boot at the pharmacy where [facility] told you about ([facility] [place] [phone]).  2)  We will contact physical therapy to have you do some whirlpool therapy daily as much as possible. (faxed PT order to [phone], [phone]-phone number) 3)  Start Gabapentin 100mg  tablets: 4)  - 2 tablets at bedtime for 2-3 days 5)  - Increase to 2 tablets two times a day for 2-3 days 6)  - Then 2 tablets three times a day for 2-3 days 7)  - Then 2 tablets in the morning, 2 in the afternoon, and 4 at bedtime.  8)  Follow up in the sports medicine clinic in 1 week.  Prescriptions: FRACTURE BOOT MEDIUM dx: Achill tendonitis Use as directed. Dispense 1  #1 x 0   Entered by:   Lupita Raider MD   Authorized by:   Denny Levy MD   Signed by:   Lupita Raider MD on 09/01/2008   Method used:   Printed then faxed to ...       [facility] [address] [phone]* (retail)       [address]       [address]       [place], [place]  [address]       Ph: [phone]       Fax: [phone]   RxID:   [id number] GABAPENTIN 100 MG CAPS (GABAPENTIN) 2 tablets at night for 2-3 days then two times a day then three times a day then 4 tablets at  night  #240 x 3   Entered by:   Lupita Raider MD   Authorized by:   Denny Levy MD   Signed by:   Lupita Raider MD on 09/01/2008   Method used:   Electronically to        [facility] [address] [phone]* (retail)       [address]       [address]       [place], [place]  [address]  Ph: [phone]       Fax: [phone]   RxID:   [id number]

## 2010-05-11 NOTE — Assessment & Plan Note (Signed)
Summary: Follow up Insulin start   Vital Signs:  Patient Profile:   50 Years Old Female Height:     69 inches (175.26 cm) Weight:      373.4 pounds Temp:     98.9 degrees F oral Pulse rate:   78 / minute BP sitting:   124 / 83  (right arm)  Pt. in pain?   yes    Location:   knee    Intensity:   6    Type:       aching  Vitals Entered ByFilomena Jungling (December 28, 2006 3:00 PM)              Is Patient Diabetic? Yes   Have you ever been in a relationship where you felt threatened, hurt or afraid?No   Does patient need assistance? Functional Status Self care Ambulation Normal     Chief Complaint:  check-up.  Current Allergies: ! DARVOCET    Risk Factors:  Tobacco use:  never Alcohol use:  no Exercise:  no Seatbelt use:  100 %  Mammogram History:    Date of Last Mammogram:  01/04/2006  PAP Smear History:    Date of Last PAP Smear:  01/04/2006   Vital Signs:  Patient Profile:   50 Years Old Female Height:     69 inches (175.26 cm) Weight:      373.4 pounds Temp:     98.9 degrees F oral Pulse rate:   78 / minute BP sitting:   124 / 83  Pt. in pain?   no    Location:   knee    Intensity:   6    Type:       aching  Vitals Entered ByFilomena Jungling (December 28, 2006 3:12 PM)      Medications Added to Medication List This Visit: 1)  Nasonex 50 Mcg/act Susp (Mometasone furoate) .... Use 2 sprays in each nostril daily   Patient Instructions: 1)  Please schedule a follow-up appointment in 3 months.    Prescriptions: NASONEX 50 MCG/ACT  SUSP (MOMETASONE FUROATE) Use 2 sprays in each nostril daily  #1 inhaler x 6   Entered and Authorized by:   Julaine Fusi  DO   Signed by:   Julaine Fusi  DO on 12/28/2006   Method used:   Print then Give to Patient   RxID:   [id number] TUSSIONEX PENNKINETIC ER 8-10 MG/5ML  LQCR (CHLORPHENIRAMINE-HYDROCODONE) Take 5ml by mouth q 12 hours as needed for cough.  #54ml x 0   Entered and Authorized by:   Julaine Fusi  DO   Signed by:   Julaine Fusi  DO on 12/28/2006   Method used:   Print then Give to Patient   RxID:   [id number] ALPRAZOLAM 1 MG  TB24 (ALPRAZOLAM) Take 1 tablet by mouth once a day at bedtime  #30 x 2   Entered and Authorized by:   Julaine Fusi  DO   Signed by:   Julaine Fusi  DO on 12/28/2006   Method used:   Print then Give to Patient   RxID:   [id number]  ]  Vital Signs:  Patient Profile:   50 Years Old Female Height:     69 inches (175.26 cm) Weight:      373.4 pounds Temp:     98.9 degrees F oral Pulse rate:   78 / minute BP sitting:   124 / 83    Location:   knee  Intensity:   6    Type:       aching             Last PAP Result Normal PapHx  Normal (01/04/2006 10:01:37 AM)       Laboratory Results

## 2010-05-11 NOTE — Progress Notes (Signed)
Summary: prior auth./gp  Phone Note Outgoing Call   Summary of Call: Received request for Prior Authorization  for Veramyst. [phone] was called; pt.  needs to try generic Flonase and another steroid nasal spray before Veramyst can be apprived. Initial call taken by: Chinita Pester RN,  February 25, 2009 10:42 AM    New/Updated Medications: FLUTICASONE PROPIONATE 50 MCG/ACT SUSP (FLUTICASONE PROPIONATE) 2 puffs iin both nares q day Prescriptions: FLUTICASONE PROPIONATE 50 MCG/ACT SUSP (FLUTICASONE PROPIONATE) 2 puffs iin both nares q day  #1 x 11   Entered and Authorized by:   Zoila Shutter MD   Signed by:   Zoila Shutter MD on 02/25/2009   Method used:   Electronically to        [facility] [address] [phone]* (retail)       [address]       [address]       [place], [place]  [address]       Ph: [phone]       Fax: [phone]   RxID:   [id number]

## 2010-05-11 NOTE — Progress Notes (Signed)
Summary: After hours request for anxiety meds denied  Phone Note Call from Patient   Summary of Call: Patient call again requesting refill for Benzo, she is complaining of anxiety for last 3 weeks. I explain to her that the medication is not on her list. She should call the office for appointment or go to Urgent care.

## 2010-05-11 NOTE — Progress Notes (Signed)
Summary: refill/gg  Phone Note Refill Request  on July 18, 2008 12:33 PM  Refills Requested: Medication #1:  VICODIN ES 7.5-750 MG TABS Take 1 tablet by mouth every 6 hours as needed for pain   Last Refilled: 06/12/2008 Pt is out, Dr Phillips Odor   Method Requested: Telephone to Pharmacy Initial call taken by: Merrie Roof RN,  July 18, 2008 12:33 PM  Follow-up for Phone Call        Refill approved-nurse to complete Follow-up by: Ulyess Mort MD,  July 18, 2008 2:09 PM  Additional Follow-up for Phone Call Additional follow up Details #1::        Rx faxed to pharmacy Additional Follow-up by: Merrie Roof RN,  July 18, 2008 2:29 PM      Prescriptions: VICODIN ES 7.5-750 MG TABS (HYDROCODONE-ACETAMINOPHEN) Take 1 tablet by mouth every 6 hours as needed for pain  #120 x 0   Entered and Authorized by:   Ulyess Mort MD   Signed by:   Ulyess Mort MD on 07/18/2008   Method used:   Printed then faxed to ...       [facility] (mail-order)       [address]       [place], [place]  [address]       Ph: [phone]       Fax: [phone]   RxID:   [id number]

## 2010-05-11 NOTE — Progress Notes (Signed)
Summary: After hours request for Pain Meds  Phone Note Call from Patient   Reason for Call: Refill Medication Summary of Call: Her erx request for muscle relaxant did not go through for 3-4 days. She has bad cramps of leg. She is unsure why she is having it. I have refilled it for now. She might need electrolyte and other work up. I will defer that to PCP.  Initial call taken by: Clerance Lav MD,  September 16, 2009 8:07 PM

## 2010-05-11 NOTE — Progress Notes (Signed)
Summary: phone/gg     Phone Note Call from Patient   Caller: Patient Summary of Call: Pt called with c/o having neuropathy pain and wants to be seen.  She has a scheduled appointment on Wednesday of this week. I returned call to get more information and no answer.  Will try again. Initial call taken by: Merrie Roof RN,  September 28, 2009 9:51 AM  Follow-up for Phone Call        I was able to reach pt's home burt she was out.  left message with family member to return call to clinic. Follow-up by: Merrie Roof RN,  September 28, 2009 2:05 PM

## 2010-05-11 NOTE — Letter (Signed)
Summary: [facility]   Imported By: Florinda Marker 10/27/2008 14:02:21  _____________________________________________________________________  External Attachment:    Type:   Image     Comment:   External Document

## 2010-05-11 NOTE — Progress Notes (Signed)
Summary: Patient Assistance    new appliction mailed today for Toprol XL 200mg ...................................................................Marland KitchenConcepcion [name]  December 20, 2006 9:31 AM

## 2010-05-11 NOTE — Progress Notes (Signed)
  Phone Note Call from Patient   Reason for Call: Acute Illness Summary of Call: Patient calls in with continued pain in feet and generalised swelling in the feet. She has not benifted from pain pills or prednisone. Her recent doppler was negative for blood clot.  Initial call taken by: Clerance Lav MD,  October 03, 2009 10:00 PM  Follow-up for Phone Call        After extensive work up it seems unlikely to be secondary to blood clot. I will emperically treat her for gout and see if it improves her pain and symptoms. Otherwise, advised to come to ED and may need admission. She will call back on Monday if not improved.  Follow-up by: Clerance Lav MD,  October 03, 2009 10:05 PM    New/Updated Medications: INDOMETHACIN 50 MG CAPS (INDOMETHACIN) Every eight hours as needed for pain COLCRYS 0.6 MG TABS (COLCHICINE) Take two tablets at once, then take one after one hour. Prescriptions: COLCRYS 0.6 MG TABS (COLCHICINE) Take two tablets at once, then take one after one hour.  #3 x 0   Entered and Authorized by:   Clerance Lav MD   Signed by:   Clerance Lav MD on 10/03/2009   Method used:   [facility]        [facility] [address] [phone]* (retail)       [address]       [place], [place]  [address]       Ph: [phone] or [phone]       Fax: [phone]   RxID:   [id number] INDOMETHACIN 50 MG CAPS (INDOMETHACIN) Every eight hours as needed for pain  #30 x 0   Entered and Authorized by:   Clerance Lav MD   Signed by:   Clerance Lav MD on 10/03/2009   Method used:   [facility]        [facility] [address] [phone]* (retail)       [address]       [place], [place]  [address]       Ph: [phone] or [phone]       Fax: [phone]   RxID:   [id number]

## 2010-05-11 NOTE — Progress Notes (Signed)
Summary: Patient Assistance  Phone Note Refill Request        Prescriptions: LANTUS SOLOSTAR 100 UNIT/ML  SOLN (INSULIN GLARGINE) 34 units at bedtime daily  #3 moth supp x 0   Entered and Authorized by:   Concepcion Elk   A.A.S.,CPht II   Signed by:   Julaine Fusi  DO on 12/02/2007   Method used:   Samples Given   RxID:   [id number]   Patient Assist Medication Verification: Medication: Lantus Solastar   Lot# [id number] Exp Date:12/10 Tech approval:NLS

## 2010-05-11 NOTE — Progress Notes (Signed)
Summary: refill/ hla  Phone Note Refill Request Message from:  Fax from Pharmacy on May 20, 2009 8:52 AM  Refills Requested: Medication #1:  CLONAZEPAM 1 MG TABS Take 1 tablet by mouth two times a day   Dosage confirmed as above?Dosage Confirmed   Last Refilled: 1/15 Initial call taken by: Marin Roberts RN,  May 20, 2009 8:52 AM  Follow-up for Phone Call        Rx refill request faxed to [facility] on [address]. Follow-up by: Chinita Pester RN,  May 25, 2009 9:43 AM    Prescriptions: CLONAZEPAM 1 MG TABS (CLONAZEPAM) Take 1 tablet by mouth two times a day  #60 x 0   Entered by:   Zoila Shutter MD   Authorized by:   Marland Kitchen [facility] ATTENDING DESKTOP   Signed by:   Zoila Shutter MD on 05/22/2009   Method used:   Telephoned to ...       [facility] [id number]* (retail)       [address] [place], [place]  [address]       Ph: [phone]       Fax: [phone]   RxID:   [id number]

## 2010-05-11 NOTE — Consult Note (Signed)
[facility]   Imported By: Florinda Marker 11/10/2008 14:30:18  _____________________________________________________________________  External Attachment:    Type:   Image     Comment:   External Document

## 2010-05-11 NOTE — Assessment & Plan Note (Signed)
Summary: 10:30-R FOOT PAIN,MC   Vital Signs:  Patient profile:   50 year old female Pulse rate:   81 / minute BP sitting:   157 / 91  Vitals Entered By: Lillia Pauls CMA (October 09, 2009 10:46 AM)  Primary Care Provider:  Julaine Fusi  DO   History of Present Illness: Right foot pain 2 weeks---was seen at ED--had x rays. they thought it was gout and gave her some indomethacin and three tabs of colchrys. She is a little better but foot is still too painful to walk on much. Has been warm and red. no fevers. no injury. Pain diffuse but worse ingreat toe joint. Pain 5-9/10. PERTINENT PMH/PSH: no prior injury to foot, no prior surgery to foot.  Allergies: 1)  ! Darvocet  Past History:  Past Medical History: Last updated: 02/25/2008 Hypertension Diabetes mellitus, type II Hyperlipidemia Obesity h/o dysfunctional uterine bleeding allergic rhinitis s/p tubal lugation Anxiety h/o chest pain w neg cardiolite in 2005 kidney stones with extraction 2009  Social History: Last updated: 08/10/2009 Separated. No ETOH. No Tobacco. No Drugs.  Review of Systems  The patient denies anorexia, fever, and peripheral edema.         intentional weight loss  Physical Exam  General:  alert, well-developed, well-nourished, well-hydrated, and overweight-appearing.   Msk:  right foot is not warm, the great oe MTP joint is redder than surrounding tissue and is TTP.  Nodeformity. Neurovascularly inatct foot.   Impression & Recommendations:  Problem # 1:  FOOT PAIN, RIGHT (ICD-729.5) I reviewed her ED report oncluding xrays of foot and ankle. They did not get a uric acid or creatinine. I will order both of those labs, start her on colchrys, f/u 3 weeks, sooner with problems. WBAT, elevate when possible. discussed as needed use of NSAIDS given her DM, renal insufficiency. will have to monitor and weigh usefulness of chronic colchrys with  her DM etc.  Complete Medication List: 1)  Toprol Xl  200 Mg Tb24 (Metoprolol succinate) .... Take 1 tablet by mouth once a day 2)  Lotensin 40 Mg Tabs (Benazepril hcl) .Marland Kitchen.. 1 by mouth daily 3)  Glucophage 1000 Mg Tabs (Metformin hcl) .Marland Kitchen.. 1 by mouth two times a day 4)  Glucotrol 5 Mg Tabs (Glipizide) .... Take 1 tablet by mouth two times a day 5)  Lantus 100 Unit/ml Soln (Insulin glargine) .... Inject 45 units at bedtime 6)  Protonix 40 Mg Tbec (Pantoprazole sodium) .... Take 1 tablet by mouth once a day 7)  Vitamin D3 1000 Unit Caps (Cholecalciferol) .... Take 1 tablet by mouth once a day 8)  Tb Syringe 1 Ml Misc (Needles & syringes) .... Use as directed 9)  Carisoprodol 350 Mg Tabs (Carisoprodol) .... One by mouth three times a day 10)  Hydrochlorothiazide 25 Mg Tabs (Hydrochlorothiazide) .... Take 1 tablet by mouth every morning 11)  Fluoxetine Hcl 40 Mg Caps (Fluoxetine hcl) .... Take 1 tablet by mouth once a day 12)  Gabapentin 300 Mg Caps (Gabapentin) .... Take 1 tablet by mouth three times a day 13)  Dibucaine 1 % Oint (Dibucaine) .... Apply to hemorrhoid up to 4 times daily as needed 14)  Trazodone Hcl 150 Mg Tabs (Trazodone hcl) .... Start one tab by mouth at bedtime for 3 days then increase to 2 tablets po at bedtime 15)  Prednisone (pak) 5 Mg Tabs (Prednisone) .... Use as directed per pharmcist 16)  Percocet 5-325 Mg Tabs (Oxycodone-acetaminophen) .... Take 1 tablet every  12 hours as needed as pain 17)  Indomethacin 50 Mg Caps (Indomethacin) .... Every eight hours as needed for pain 18)  Colcrys 0.6 Mg Tabs (Colchicine) .... Take one tab three times a day for two days then start one by mouth once daily  Patient Instructions: 1)  Please take the lab slip over to internal medicine dept and get your lab done today. I have called in a prescription for colechrys--for two days take one three times a day, then start taking one every day. 2)  Let me see you in 3 weeks or so. Prescriptions: COLCRYS 0.6 MG TABS (COLCHICINE) take one tab three  times a day for two days then start one by mouth once daily  #36 x 3   Entered and Authorized by:   Denny Levy MD   Signed by:   Denny Levy MD on 10/09/2009   Method used:   [facility]        [facility] [address] [phone]* (retail)       [address]       [place], [place]  [id number]       Ph: [phone] or [phone]       Fax: [phone]   RxID:   [id number]    Complete Medication List: 1)  Toprol Xl 200 Mg Tb24 (Metoprolol succinate) .... Take 1 tablet by mouth once a day 2)  Lotensin 40 Mg Tabs (Benazepril hcl) .Marland Kitchen.. 1 by mouth daily 3)  Glucophage 1000 Mg Tabs (Metformin hcl) .Marland Kitchen.. 1 by mouth two times a day 4)  Glucotrol 5 Mg Tabs (Glipizide) .... Take 1 tablet by mouth two times a day 5)  Lantus 100 Unit/ml Soln (Insulin glargine) .... Inject 45 units at bedtime 6)  Protonix 40 Mg Tbec (Pantoprazole sodium) .... Take 1 tablet by mouth once a day 7)  Vitamin D3 1000 Unit Caps (Cholecalciferol) .... Take 1 tablet by mouth once a day 8)  Tb Syringe 1 Ml Misc (Needles & syringes) .... Use as directed 9)  Carisoprodol 350 Mg Tabs (Carisoprodol) .... One by mouth three times a day 10)  Hydrochlorothiazide 25 Mg Tabs (Hydrochlorothiazide) .... Take 1 tablet by mouth every morning 11)  Fluoxetine Hcl 40 Mg Caps (Fluoxetine hcl) .... Take 1 tablet by mouth once a day 12)  Gabapentin 300 Mg Caps (Gabapentin) .... Take 1 tablet by mouth three times a day 13)  Dibucaine 1 % Oint (Dibucaine) .... Apply to hemorrhoid up to 4 times daily as needed 14)  Trazodone Hcl 150 Mg Tabs (Trazodone hcl) .... Start one tab by mouth at bedtime for 3 days then increase to 2 tablets po at bedtime 15)  Prednisone (pak) 5 Mg Tabs (Prednisone) .... Use as directed per pharmcist 16)  Percocet 5-325 Mg Tabs (Oxycodone-acetaminophen) .... Take 1 tablet every 12 hours as needed as pain 17)  Indomethacin 50 Mg Caps (Indomethacin) .... Every eight hours as needed for pain 18)   Colcrys 0.6 Mg Tabs (Colchicine) .... Take one tab three times a day for two days then start one by mouth once daily

## 2010-05-11 NOTE — Progress Notes (Signed)
Summary: requesting benzo's  Phone Note Call from Patient   Caller: Patient Summary of Call: Pt called in c/o frequent anxiety attacks and wanting something for her nerves.  She said she is having chest pains and palpitations and the medications given to her at her last visit are not working.  I told her she should go to the ER bc we could not prescribe controlled substances over the phone and she should have her chest pain evaluated in the ER.   Initial call taken by: Joaquin Courts  MD,  June 28, 2009 2:56 PM

## 2010-05-11 NOTE — Progress Notes (Signed)
Summary: refill/gg  NO OPIATES FOR ?GOUT  Phone Note Refill Request   Refills Requested: Medication #1:  PERCOCET 5-325 MG TABS take 1 tablet every 12 hours as needed as pain   Last Refilled: 09/30/2009 Pt would like you to review her las lab results.  She needs pain meds for gout.   Method Requested: Pick up at Office Initial call taken by: Merrie Roof RN,  October 21, 2009 1:47 PM  Follow-up for Phone Call        No Opiates should be prescribed by our office. I have reviewed records from [facility] and she has had over 200+pills of oxycodone/opana filled since June 22-less than one month. Her UDS has always been pan negative. I am increasingly more suspicious about diversion. Her opiates have been prescibed by Dr. Margaretha Sheffield and a few tabs by our office and by the ED. I am not convinced that her mildly elevated UA is diagnostic of gout or that gout is even an indication for opiates-the patient is requesting opitaes for her "gout flare" which has now going on for 3 weeks. Ideally synovial fluid analysis would confirm dx. Symptoms should be improving.  Follow-up by: Julaine Fusi  DO,  October 21, 2009 4:04 PM  Additional Follow-up for Phone Call Additional follow up Details #1::        Pt informed, has no questions. She understands she will not get opiates from this clinic. Additional Follow-up by: Merrie Roof RN,  October 21, 2009 4:58 PM

## 2010-05-11 NOTE — Medication Information (Signed)
Summary: RX HISTORY REPORT  RX HISTORY REPORT   Imported By: Margie Billet 12/31/2009 14:48:04  _____________________________________________________________________  External Attachment:    Type:   Image     Comment:   External Document

## 2010-05-11 NOTE — Progress Notes (Signed)
Summary: Refill/gh  Phone Note Refill Request Message from:  Patient on October 09, 2007 4:13 PM  Refills Requested: Medication #1:  FLUCONAZOLE 150 MG  TABS take 1 tablet by mouth once and may repeat in 1 week if itching/dyscharge persists Says she has a yeast infection.   Method Requested: electronic Initial call taken by: Angelina Ok RN,  October 09, 2007 4:13 PM      Prescriptions: FLUCONAZOLE 150 MG  TABS (FLUCONAZOLE) take 1 tablet by mouth once and may repeat in 1 week if itching/dyscharge persists  #1 x 1   Entered and Authorized by:   Ulyess Mort MD   Signed by:   Ulyess Mort MD on 10/09/2007   Method used:   Electronically sent to ...       [facility]       [address]       [place], [place]  [address]       Ph: [phone]       Fax: [phone]   RxID:   [id number]

## 2010-05-11 NOTE — Miscellaneous (Signed)
  Pt called that she is having a yeast infection and she would like another Diflucan tablet as she took in 03/2007 when she had her previous yeast infection. I will give her 1 tablet of 150 with 1 refill to be taken in 1 week if spx persist. .................................................................Marland KitchenCarlus Pavlov MD  August 07, 2007 6:34 PM     Clinical Lists Changes  Medications: Added new medication of FLUCONAZOLE 150 MG  TABS (FLUCONAZOLE) take 1 tablet by mouth once and may repeat in 1 week if itching/dyscharge persists - Signed Rx of FLUCONAZOLE 150 MG  TABS (FLUCONAZOLE) take 1 tablet by mouth once and may repeat in 1 week if itching/dyscharge persists;  #1 x 1;  Signed;  Entered by: Carlus Pavlov MD;  Authorized by: Carlus Pavlov MD;  Method used: Electronic    Prescriptions: FLUCONAZOLE 150 MG  TABS (FLUCONAZOLE) take 1 tablet by mouth once and may repeat in 1 week if itching/dyscharge persists  #1 x 1   Entered and Authorized by:   Carlus Pavlov MD   Signed by:   Carlus Pavlov MD on 08/07/2007   Method used:   Electronically sent to ...       [facility]       [address]       [place], [place]  [address]       Ph: [phone]       Fax: [phone]   RxID:   [id number]

## 2010-05-11 NOTE — Medication Information (Signed)
Summary: RX HISTORY REPORT 07-31-2009-01-27-2010  RX HISTORY REPORT 07-31-2009-01-27-2010   Imported By: Margie Billet 02/22/2010 11:13:50  _____________________________________________________________________  External Attachment:    Type:   Image     Comment:   External Document

## 2010-05-11 NOTE — Progress Notes (Signed)
Summary: refill/ hla  Phone Note Refill Request Message from:  Patient on May 07, 2008 12:04 PM  Refills Requested: Medication #1:  ALPRAZOLAM 1 MG  TB24 Take 1 tablet by mouth three times a day as needed for anxiety Initial call taken by: Marin Roberts RN,  May 07, 2008 12:05 PM  Follow-up for Phone Call        She needs to come in -I do not want her on three times a day xanax. I need to look back at her refill history- I am slightly suspicious for overuse/misuse. Follow-up by: Julaine Fusi  DO,  May 08, 2008 9:33 AM  Additional Follow-up for Phone Call Additional follow up Details #1::        Pt fhas appointment with you for 2/2. Can she have enough to last until then? Social services took her granddaughter this week and she is very upset Additional Follow-up by: Merrie Roof RN,  May 08, 2008 1:59 PM    Additional Follow-up for Phone Call Additional follow up Details #2::    Refill #30  until next visit. Please remind her to bring all of her pill bottles to her next visit-even if they are empty. Follow-up by: Julaine Fusi  DO,  May 08, 2008 2:56 PM  Additional Follow-up for Phone Call Additional follow up Details #3:: Details for Additional Follow-up Action Taken: Pt informed, meds called in Additional Follow-up by: Merrie Roof RN,  May 08, 2008 4:32 PM    Prescriptions: ALPRAZOLAM 1 MG  TB24 (ALPRAZOLAM) Take 1 tablet by mouth three times a day as needed for anxiety  #30 x 0   Entered and Authorized by:   Julaine Fusi  DO   Signed by:   Julaine Fusi  DO on 05/08/2008   Method used:   Telephoned to ...       [facility] [id number]* (retail)       [address]       [place], [place]  [address]       Ph: [phone]       Fax: [phone]   RxID:   [id number]

## 2010-05-11 NOTE — Progress Notes (Signed)
Summary: refill/ hla  Phone Note Refill Request Message from:  Fax from Pharmacy on October 16, 2009 2:53 PM  Refills Requested: Medication #1:  flucanozole 150mg  1by mouth daily #2   Last Refilled: 4/29 Initial call taken by: Marin Roberts RN,  October 16, 2009 2:53 PM  Follow-up for Phone Call        Rx completed in Dr. Tiajuana Amass Follow-up by: Julaine Fusi  DO,  October 21, 2009 5:21 PM    New/Updated Medications: FLUCONAZOLE 150 MG TABS (FLUCONAZOLE) Take one tab by mouth X1 dose repeat in 3 days. Prescriptions: FLUCONAZOLE 150 MG TABS (FLUCONAZOLE) Take one tab by mouth X1 dose repeat in 3 days.  #2 x 2   Entered and Authorized by:   Julaine Fusi  DO   Signed by:   Julaine Fusi  DO on 10/21/2009   Method used:   [facility]        [facility] [phone]* (retail)       [address]       [place], [place]  [id number]       Ph: [phone] or [phone]       Fax: [phone]   RxID:   [id number]

## 2010-05-11 NOTE — Assessment & Plan Note (Signed)
Summary:  [facility]   History of Present Illness: f/u achilles pain--95% better. has stopped wearing the boot and still doing well. The gabapentin really seems to help. New issue of chronic l shoulder pain, worse over last few months. Has had injection therapy long ago and it helped.  Continued roblems w r knee pain--wants to see a ortho doctor taht her sister saw in [place]--does not know if she needs a referal.  Plan bariatric surgery in the future.([facility])  Allergies: 1)  ! Darvocet  Physical Exam  General:  overweight-appearing.  overweight-appearing.     Knee Exam  Knee Exam:    Right:    Inspection/Palpation:  R knee medail join line tenderness. no effusion, no erythema. Lacks fuill extension by 10 degrees..   Shoulder/Elbow Exam  Shoulder Exam:    Left:    Inspection:  Normal    Palpation:  Normal    Stability:  stable    pain w abduction at 45 degres and above. Normal rotator cuff musculature strngth. distally neurovascular intact   Impression & Recommendations:  Problem # 1:  ACHILLES TENDINITIS (ICD-726.71) Assessment Improved  Problem # 2:  KNEE PAIN, BILATERAL (ICD-719.46) Assessment: Unchanged  Her updated medication list for this problem includes:    Oxycodone-acetaminophen 10-325 Mg Tabs (Oxycodone-acetaminophen) .Marland Kitchen... Take 1 tablet by mouth two times a day    Meloxicam 15 Mg Tabs (Meloxicam) ..... One by mouth daily - per dr draper  Problem # 3:  ROTATOR CUFF SYNDROME, LEFT (ICD-726.10) Assessment: Deteriorated  Complete Medication List: 1)  Toprol Xl 200 Mg Tb24 (Metoprolol succinate) .... Take 1 tablet by mouth once a day 2)  Lotensin 40 Mg Tabs (Benazepril hcl) .Marland Kitchen.. 1 by mouth daily 3)  Glucophage 1000 Mg Tabs (Metformin hcl) .Marland Kitchen.. 1 by mouth two times a day 4)  Calcium 500 Mg Tabs (Calcium) 5)  Glucotrol 5 Mg Tabs (Glipizide) .... Take 1 tablet by mouth two times a day 6)  Hydrochlorothiazide 25 Mg Tabs (Hydrochlorothiazide) .Marland Kitchen.. 1 by mouth daily 7)   Alprazolam 1 Mg Tb24 (Alprazolam) .... Take 1 tablet by mouth three times a day as needed for anxiety 8)  Lantus Solostar 100 Unit/ml Soln (Insulin glargine) .... 34 units at bedtime daily 9)  Bd Ultra-fine Pen Needles 29g X 12.57mm Misc (Insulin pen needle) 10)  Protonix 40 Mg Tbec (Pantoprazole sodium) .... Take 1 tablet by mouth once a day 11)  Voltaren 1 % Gel (Diclofenac sodium) .... Apply 4-8 grams to painful joints up to two times a day as needed and as directed. 12)  Atrovent 0.06 % Soln (Ipratropium bromide) .... 2 puffs in each nostril up to three times a day as needed for nasal congestion. 13)  Fluconazole 150 Mg Tabs (Fluconazole) .... Take 1 tablet by mouth once and may repeat in 1 week if itching/dyscharge persists 14)  Promethazine Hcl 25 Mg Tabs (Promethazine hcl) .... Take 1 tablet by mouth every 6 hours 15)  Vitamin D3 1000 Unit Caps (Cholecalciferol) .... Take 1 tablet by mouth once a day 16)  Oxycodone-acetaminophen 10-325 Mg Tabs (Oxycodone-acetaminophen) .... Take 1 tablet by mouth two times a day 17)  Astelin 137 Mcg/spray Soln (Azelastine hcl) .... One spray in each nostril two times a day as directed 18)  Gabapentin 100 Mg Caps (Gabapentin) .... 2 tablets at night for 2-3 days then two times a day then three times a day then 4 tablets at night 19)  Meloxicam 15 Mg Tabs (Meloxicam) .... One by  mouth daily - per dr Margaretha Sheffield 20)  Fracture Boot Medium  .... Dx: achill tendonitis use as directed. dispense 1  Other Orders: Joint Aspirate / Injection, Large (20610) Kenalog 10 mg inj (E7035)  Patient Instructions: 1)  I have given you prescriptions for the diclofenac--please take that twice a day--do not take advil or ibuprofen with it. 2)  Please also take tylenol 500 mg, 2 at a time, three times a day (total of 6 tylenol a day)----and you may also take 2 percocette at night--this will give you the maximum dose of tylenol and dicofenac. I think these together with your percocette  will help your knee pain. 3)  Stay on the gabapentin. 4)  [facility] a [facility]. Great to see you!
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
## 2010-05-11 NOTE — Assessment & Plan Note (Signed)
Summary: Depo-provera   Allergies: 1)  ! Darvocet   Complete Medication List: 1)  Toprol Xl 200 Mg Tb24 (Metoprolol succinate) .... Take 1 tablet by mouth once a day 2)  Lotensin 40 Mg Tabs (Benazepril hcl) .Marland Kitchen.. 1 by mouth daily 3)  Glucophage 1000 Mg Tabs (Metformin hcl) .Marland Kitchen.. 1 by mouth two times a day 4)  Glucotrol 5 Mg Tabs (Glipizide) .... Take 1 tablet by mouth two times a day 5)  Lantus 100 Unit/ml Soln (Insulin glargine) .... Inject 45 units at bedtime 6)  Protonix 40 Mg Tbec (Pantoprazole sodium) .... Take 1 tablet by mouth once a day 7)  Vitamin D3 1000 Unit Caps (Cholecalciferol) .... Take 1 tablet by mouth once a day 8)  Tb Syringe 1 Ml Misc (Needles & syringes) .... Use as directed 9)  Carisoprodol 350 Mg Tabs (Carisoprodol) .... One by mouth three times a day 10)  Hydrochlorothiazide 25 Mg Tabs (Hydrochlorothiazide) .... Take 1 tablet by mouth every morning 11)  Fluoxetine Hcl 40 Mg Caps (Fluoxetine hcl) .... Take 1 tablet by mouth once a day 12)  Gabapentin 300 Mg Caps (Gabapentin) .... Take 1 tablet by mouth three times a day 13)  Dibucaine 1 % Oint (Dibucaine) .... Apply to hemorrhoid up to 4 times daily as needed 14)  Trazodone Hcl 150 Mg Tabs (Trazodone hcl) .... Start one tab by mouth at bedtime for 3 days then increase to 2 tablets po at bedtime 15)  Prednisone (pak) 5 Mg Tabs (Prednisone) .... Use as directed per pharmcist 16)  Percocet 5-325 Mg Tabs (Oxycodone-acetaminophen) .... Take 1 tablet every 12 hours as needed as pain  Other Orders: Admin of Therapeutic Inj  intramuscular or subcutaneous (25427) Depo-Provera 150mg  (C6237)   Medication Administration  Injection # 1:    Medication: Depo-Provera 150mg     Diagnosis: DYSFUNCTIONAL UTERINE BLEEDING (ICD-626.8)    Route: IM    Site: LUOQ gluteus    Exp Date: 08/2011    Lot #: [id number]    Mfr: Illene Silver    Comments: per Dr Aldine Contes    Patient tolerated injection without complications    Given by: Stanton Kidney  Ditzler RN (September 30, 2009 2:40 PM)  Orders Added: 1)  Admin of Therapeutic Inj  intramuscular or subcutaneous [96372] 2)  Depo-Provera 150mg  [J1055]

## 2010-05-11 NOTE — Progress Notes (Signed)
Summary: needs meter & almost out of lantus/dmr  Phone Note Call from Patient Call back at Home Phone [phone]   Caller: Patient Summary of Call: wondering about relion blood glucose meter- do I have one- also needs lantus pens - going to run out this weekend- Marcelle Smiling trying to get them from her through the drug assistance program [phone]  Follow-up for Phone Call        spoke with Marcelle Smiling who talked to patient yesterday and informed her she needs to bring paperwork in to receive lantus Follow-up by: Jamison Neighbor RD,CDE,  Aug 29, 2007 3:01 PM  Additional Follow-up for Phone Call Additional follow up Details #1::        called patient and informed her of [employer] meter patient assistance and to speak with natasha about her insulin Additional Follow-up by: Jamison Neighbor RD,CDE,  Aug 29, 2007 3:04 PM
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
## 2010-05-11 NOTE — Progress Notes (Signed)
Summary: hand/ hla  Phone Note Call from Patient   Summary of Call: pt called stating she was having tingling and numbness in 1 hand, started today, she thinks it is associated w/ a pinched nerve that she sees an ortho for, she asks if she should call him, i said yes this would be best and then if she needed a referral or the ortho thought she needed to be seen by the pcp to call back and an appt would be scheduled. Initial call taken by: Marin Roberts RN,  October 27, 2009 2:17 PM  Follow-up for Phone Call        agree with plan. Follow-up by: Julaine Fusi  DO,  October 27, 2009 4:26 PM

## 2010-05-11 NOTE — Assessment & Plan Note (Signed)
Summary: L ARM CORT SHOT,MC   Vital Signs:  Patient profile:   50 year old female Pulse rate:   98 / minute BP sitting:   140 / 90  (right arm)  Vitals Entered By: Terese Door (June 01, 2009 3:53 PM) CC: LEFT ARM PAIN ?ROTATOR CUFF TEAR Pain Assessment Patient in pain? yes     Location: shoulder Intensity: 9   Primary Care Provider:  Julaine Fusi  DO  CC:  LEFT ARM PAIN ?ROTATOR CUFF TEAR.  History of Present Illness: Left shoulder pain 9/10 worse over last few weeks. Difficulty sleeping secondary to pain. Pain also worse if she tries to elevate arm. occasional sharp shooting pain down her arm. No numbness or tingling. Arm feels weak secondary to pain.  Saw ortho doctor 4-6 weeks ago and had shoulder injection but that helped minimally. She is not sure he 'did it right" and is here today for eval for another injection as they have helped in the past.  She has lost 38 pounds, is interested in pursuing gastric bypass and in a program prepping her for eval for that at [facility].  Still lots of problems w her knee and they have told her (ortho) that she is not a surgical candidate until she loses weight  Allergies: 1)  ! Darvocet  Physical Exam  General:  alert, well-developed, and overweight-appearing.   Msk:  LUE is distally neurovascularly intact. She will only abduct to 45 degrees secondary to pain and will forward flex to 79 degrees. Has intact strength in external and internal rotation but cannot put her nhand behind her back. Pain and subsequent weakness on supraspinatis testing.   Impression & Recommendations:  Problem # 1:  ROTATOR CUFF SYNDROME, LEFT (ICD-726.10)  we discussed options. Itold her that since she did not get much relief from the last injection, it is unlikely this one will help but we will give it a try. Would like her to start back on her rotator cuff program. Applauded her weight loss.  Orders: Joint Aspirate / Injection, Large (20610)  Complete  Medication List: 1)  Toprol Xl 200 Mg Tb24 (Metoprolol succinate) .... Take 1 tablet by mouth once a day 2)  Lotensin 40 Mg Tabs (Benazepril hcl) .Marland Kitchen.. 1 by mouth daily 3)  Glucophage 1000 Mg Tabs (Metformin hcl) .Marland Kitchen.. 1 by mouth two times a day 4)  Glucotrol 5 Mg Tabs (Glipizide) .... Take 1 tablet by mouth two times a day 5)  Lantus 100 Unit/ml Soln (Insulin glargine) .... Inject 45 units at bedtime 6)  Protonix 40 Mg Tbec (Pantoprazole sodium) .... Take 1 tablet by mouth once a day 7)  Vitamin D3 1000 Unit Caps (Cholecalciferol) .... Take 1 tablet by mouth once a day 8)  Gabapentin 100 Mg Caps (Gabapentin) .... Three pills by mouth twice a day. 9)  Tb Syringe 1 Ml Misc (Needles & syringes) .... Use as directed 10)  Carisoprodol 350 Mg Tabs (Carisoprodol) .... One by mouth three times a day 11)  Walker  .... Use as directed  dx: gait instability, fall 12)  Lidoderm 5 % Ptch (Lidocaine) .... Apply patch to affected knee for up to 12 hours a day. 13)  Fluticasone Propionate 50 Mcg/act Susp (Fluticasone propionate) .... 2 puffs iin both nares q day 14)  Hydrochlorothiazide 25 Mg Tabs (Hydrochlorothiazide) .... Take 1 tablet by mouth every morning 15)  Fluoxetine Hcl 40 Mg Caps (Fluoxetine hcl) .... Take 1 tablet by mouth once a day 16)  Acetaminophen 650 Mg Cr-tabs (Acetaminophen) .... Take one tabletby mouth every 4-6 hours as needed for pain 17)  Promethazine Hcl 12.5 Mg Tabs (Promethazine hcl) .... Take one tablet by mouth q 4 hrs prn for nausea 18)  Clonazepam 1 Mg Tabs (Clonazepam) .... Take 1 tablet by mouth two times a day 19)  Gabapentin 300 Mg Caps (Gabapentin) .... Take 1 tablet by mouth three times a day

## 2010-05-11 NOTE — Progress Notes (Signed)
Summary: refill/gg  Phone Note Refill Request   Refills Requested: Medication #1:  DIFLUCAN 150 MG TABS Take 1 tablet by mouth once a day Pt c/o yeast infection   Method Requested: Electronic Initial call taken by: Merrie Roof RN,  March 16, 2009 10:01 AM  Follow-up for Phone Call        seen in clinic today. refill done.  Follow-up by: Clerance Lav MD,  March 16, 2009 3:44 PM

## 2010-05-11 NOTE — Miscellaneous (Signed)
Summary: Mediciation Contract  Mediciation Contract   Imported By: Florinda Marker 10/22/2008 14:10:35  _____________________________________________________________________  External Attachment:    Type:   Image     Comment:   External Document

## 2010-05-11 NOTE — Progress Notes (Signed)
Summary: requesting lantus solostar sample/dmr  Phone Note Call from Patient Call back at Home Phone [phone]   Caller: Patient Summary of Call: wants to know if she should be checking her CBG before or after breakfast- sugars are 130 after breakfast on 34 untis of lantus. Advised she hold self titration until fasting CBG level is established.   Asking for Lantus Solostar sample as hers has not come in from [facility] and she cannot use the vial given to her.  gave her 4 samples on 7/15 which should last  ~40 days at  ~ 30 units/night on average (was self titrating and started at 20 units/night). Initial call taken by: Jamison Neighbor,  November 13, 2006 3:35 PM  Follow-up for Phone Call        called pt back and asked how she needed more if only on 34 units/day being used and each pen had 300 units in it (so should last until  ~ sept 1st) . She thought pens only had 100 units each  in them. and still has the penswith some insulin in them. says she now doesn't need additional insulin. Follow-up by: Jamison Neighbor,  November 13, 2006 3:40 PM  Additional Follow-up for Phone Call Additional follow up Details #1::        This all sounds fine. Is there anything I need to do? Additional Follow-up by: Ned Grace MD,  November 13, 2006 3:42 PM    New/Updated Medications: LANTUS SOLOSTAR 100 UNIT/ML  SOLN (INSULIN GLARGINE) 34 units at bedtime daily

## 2010-05-11 NOTE — Progress Notes (Signed)
Summary: Refill/gh  Phone Note Refill Request Message from:  Patient on April 02, 2009 2:05 PM  Refills Requested: Medication #1:  ALPRAZOLAM 2 MG TABS Take 1 tablet by mouth an needed at bedtime. Call from pt says that she has been taking her Xanex 2 mg twice daily instead of the once daily as ordered.  Says that she will be running out on 04/03/2009.  Call to pharmacy pt picked up Xanex 1 mg # 28 tablets on 03/16/2009 and Xanex 2 mg # 15 on 03/27/2009.  She does have a refill on the 2 mg # 15  1  tablet daily left.  Pt says that with the holidays she will need more.  Wants to take 2 mg 2 times a day until her appointment with Dr. Phillips Odor January 20th.   Method Requested: Electronic Initial call taken by: Angelina Ok RN,  April 02, 2009 2:13 PM  Follow-up for Phone Call        This patient has a severe pattern of prescription drug misuse.  Will not approve additional doses.  She must use xanax as prescribed.   Follow-up by: Ulyess Mort MD,  April 02, 2009 3:11 PM  Additional Follow-up for Phone Call Additional follow up Details #1::        RTC to pt informed her that no further refills will be given and that she is to take as prescribed 1 pill of the 2 mg as ordered.   Additional Follow-up by: Angelina Ok RN,  April 02, 2009 4:01 PM     Appended Document: Refill/gh Pt. called and informed pt. no approve additional doses will  be given per Dr. Aundria Rud.

## 2010-05-11 NOTE — Progress Notes (Signed)
Summary: yeast/ hla  Phone Note Refill Request Message from:  Patient on December 25, 2008 4:57 PM  Refills Requested: Medication #1:  diflucan 150mg  pt has yeast  Initial call taken by: Marin Roberts RN,  December 25, 2008 4:57 PM    New/Updated Medications: FLUCONAZOLE 150 MG TABS (FLUCONAZOLE) Take 1 tablet. Prescriptions: FLUCONAZOLE 150 MG TABS (FLUCONAZOLE) Take 1 tablet.  #1 x 0   Entered by:   Ulyess Mort MD   Authorized by:   Julaine Fusi  DO   Signed by:   Ulyess Mort MD on 12/25/2008   Method used:   Electronically to        [facility] [phone]* (retail)       [address]       [address]       [place], [place]  [address]       Ph: [phone]       Fax: [phone]   RxID:   [id number]

## 2010-05-11 NOTE — Letter (Signed)
Summary: *Consult Note  [name]  [address]   [place], [place] [address]   Phone: [phone]  Fax: [phone]    Re:    Maria [name] DOB:    July 03, 1960   Dear:    Maria [name] you for requesting that we see the above patient for consultation.  A copy of the detailed office note will be sent under separate cover, for your review.  Evaluation today is consistent with: achilles tendonitis, B pes planus. B knee OA severe.   Our recommendation is for: will immobilize in cam walker boot, start whirlpool therapy once daily  for 5 days, start gabapentin for pain relief and rtc 1 week. Long term she may need some custom molded orthotics for her severe pes planus which is probably contributing to her abnormal gait in setting of non operable B sever DJD knees.Weight loss also ling term would be goal.  New Orders include: cam walker boot, whirlpool therapy   New Medications started today include: gabapentin   After today's visit, the patients current medications include: 1)  TOPROL XL 200 MG TB24 (METOPROLOL SUCCINATE) Take 1 tablet by mouth once a day 2)  LOTENSIN 40 MG TABS (BENAZEPRIL HCL) 1 by mouth daily 3)  GLUCOPHAGE 1000 MG TABS (METFORMIN HCL) 1 by mouth two times a day 4)  CALCIUM 500 MG TABS (CALCIUM)  5)  GLUCOTROL 5 MG TABS (GLIPIZIDE) Take 1 tablet by mouth two times a day 6)  HYDROCHLOROTHIAZIDE 25 MG TABS (HYDROCHLOROTHIAZIDE) 1 by mouth daily 7)  ALPRAZOLAM 1 MG  TB24 (ALPRAZOLAM) Take 1 tablet by mouth three times a day as needed for anxiety 8)  LANTUS SOLOSTAR 100 UNIT/ML  SOLN (INSULIN GLARGINE) 34 units at bedtime daily 9)  BD ULTRA-FINE PEN NEEDLES 29G X 12.7MM  MISC (INSULIN PEN NEEDLE)  10)  PROTONIX 40 MG  TBEC (PANTOPRAZOLE SODIUM) Take 1 tablet by mouth once a day 11)  VOLTAREN 1 %  GEL (DICLOFENAC SODIUM) Apply 4-8 grams to painful joints up to two times a day as needed and as directed. 12)  ATROVENT 0.06 %  SOLN (IPRATROPIUM  BROMIDE) 2 puffs in each nostril up to three times a day as needed for nasal congestion. 13)  FLUCONAZOLE 150 MG  TABS (FLUCONAZOLE) take 1 tablet by mouth once and may repeat in 1 week if itching/dyscharge persists 14)  PROMETHAZINE HCL 25 MG  TABS (PROMETHAZINE HCL) Take 1 tablet by mouth every 6 hours 15)  VITAMIN D3 1000 UNIT CAPS (CHOLECALCIFEROL) Take 1 tablet by mouth once a day 16)  OXYCODONE-ACETAMINOPHEN 10-325 MG TABS (OXYCODONE-ACETAMINOPHEN) Take 1 tablet by mouth two times a day 17)  ASTELIN 137 MCG/SPRAY SOLN (AZELASTINE HCL) One spray in each nostril two times a day as directed 18)  GABAPENTIN 100 MG CAPS (GABAPENTIN) 2 tablets at night for 2-3 days then two times a day then three times a day then 4 tablets at night 19)  MELOXICAM 15 MG TABS (MELOXICAM) one by mouth daily - per Dr Margaretha Sheffield 20)  * FRACTURE BOOT MEDIUM dx: Achill tendonitis Use as directed. Dispense 1   Thank you for this consultation.  If you have any further questions regarding the care of this patient, please do not hesitate to contact me @   Thank you for this opportunity to look after your patient.  Sincerely,   Denny Levy MD

## 2010-05-11 NOTE — Progress Notes (Signed)
  Phone Note Call from Patient   Caller: Patient Details of Complaint: leg swelling Summary of Call: Patient called for bilateral leg swelling for the 2 days and watns to prescibe prednisone because she said she took this before for her leg sweeling and her swelling went away quickly. I told her she can take one extra HCTZ and elevate legs, not recommend prednisone. If worsening, advsed her to go to ED or Cl[facility]. She has appointment with Dr. Phillips Odor next Wednesday and needs to reevaluate.  Initial call taken by: Jackson Latino MD,  September 27, 2009 2:31 PM

## 2010-05-11 NOTE — Miscellaneous (Signed)
Summary: [facility]   Imported By: Margie Billet 08/24/2009 11:59:53  _____________________________________________________________________  External Attachment:    Type:   Image     Comment:   External Document

## 2010-05-11 NOTE — Progress Notes (Signed)
Summary: Patient Assistance    Protonix refill placed today, order number is [id number].

## 2010-05-11 NOTE — Progress Notes (Signed)
Summary: ? Yeast infection  Phone Note Call from Patient   Caller: Patient Call For: Julaine Fusi  DO Reason for Call: Acute Illness Complaint: Urinary/GYN Problems Action Taken: Provider Notified Summary of Call: Pt called says that she has another yeast infection and would like something for it.  Says that she is getting them frequently now Angelina Ok RN  Aug 28, 2008 3:37 PM   Initial call taken by: Angelina Ok RN,  Aug 28, 2008 3:39 PM  Follow-up for Phone Call        I have called in multiple courses of fluconazole- she needs to come in for a gyn exam with wet prep and STD screening. Follow-up by: Julaine Fusi  DO,  Sep 01, 2008 2:27 PM

## 2010-05-11 NOTE — Progress Notes (Signed)
Summary: refill/ hla  Phone Note Refill Request Message from:  Fax from Pharmacy on December 20, 2007 4:06 PM  Refills Requested: Medication #1:  VICODIN ES 7.5-750 MG TABS Take 1 tablet by mouth two times a day   Last Refilled: 8/14 Initial call taken by: Marin Roberts RN,  December 20, 2007 4:07 PM  Follow-up for Phone Call        According to the fax from pharmacy and previous refill note, the dose was changed to every 6 hours as needed for pain.  I changed the med list to reflect this.  Refill approved - nurse to complete. Follow-up by: Margarito Liner MD,  December 21, 2007 5:48 PM  Additional Follow-up for Phone Call Additional follow up Details #1::        Rx faxed to pharmacy Additional Follow-up by: Marin Roberts RN,  December 21, 2007 6:10 PM    New/Updated Medications: VICODIN ES 7.5-750 MG TABS (HYDROCODONE-ACETAMINOPHEN) Take 1 tablet by mouth every 6 hours as needed for pain   Prescriptions: VICODIN ES 7.5-750 MG TABS (HYDROCODONE-ACETAMINOPHEN) Take 1 tablet by mouth every 6 hours as needed for pain  #120 x 0   Entered and Authorized by:   Margarito Liner MD   Signed by:   Margarito Liner MD on 12/21/2007   Method used:   Telephoned to ...       [facility] (retail)             , [place]         Ph: [phone]       Fax: [phone]   RxID:   [id number]

## 2010-05-11 NOTE — Progress Notes (Signed)
Summary: phone/gg  Phone Note Call from Patient   Caller: Patient Summary of Call: Call from pt stating she is not feeling any better.  c/o foot pain and redness in foot. I returned her call and received no answer.  I tried home # and she was not there. Message left to call back tomorrow AM and go to ED if not better. Initial call taken by: Merrie Roof RN,  October 05, 2009 5:00 PM  Follow-up for Phone Call        Needs to be seen in clinic or by her orthopedist.  Follow-up by: Julaine Fusi  DO,  October 06, 2009 11:11 AM  Additional Follow-up for Phone Call Additional follow up Details #1::        Pt called to make appointment and unable to reach.  Message left Pt Cell # [phone] Additional Follow-up by: Merrie Roof RN,  October 07, 2009 4:44 PM    Additional Follow-up for Phone Call Additional follow up Details #2::    pt calls today, wants appt tomorrow, have nothing to offer, offered appt next week, pt states foot is worse, wants to see dr Sherryll Burger, informed he is not in clinic and does not have a [place] schedule at this time. she states her foot is much worse, she is referred to ed or urg care at this time and ask to schedule f/u with clinic Follow-up by: Marin Roberts RN,  October 08, 2009 3:32 PM  Additional Follow-up for Phone Call Additional follow up Details #3:: Details for Additional Follow-up Action Taken: see phone notes and flags. Additional Follow-up by: Julaine Fusi  DO,  October 08, 2009 4:11 PM

## 2010-05-11 NOTE — Miscellaneous (Signed)
Summary: NO Opiates or Benzodiazapines  Clinical Lists Changes  Medications: Removed medication of PERCOCET 5-325 MG TABS (OXYCODONE-ACETAMINOPHEN) take 1 tablet every 12 hours as needed as pain

## 2010-05-11 NOTE — Progress Notes (Signed)
Summary: Refill/ Needs more med  Phone Note Call from Patient   Caller: Patient Call For: Julaine Fusi  DO Summary of Call: Call from pt says that the 1 mg Xanex is not working and she has increased them to 2 tablets to make it 2mg  that she is used to taking.  Is almost out and needs arefill on the 2 mg tablets.  She can be reached at [phone]. Angelina Ok RN  March 27, 2009 10:16 AM  Initial call taken by: Angelina Ok RN,  March 27, 2009 10:16 AM  Follow-up for Phone Call        please refer to pcp. I asked her specifically not to do it.  Follow-up by: Clerance Lav MD,  March 27, 2009 12:18 PM

## 2010-05-11 NOTE — Progress Notes (Signed)
Summary: Refill/gh  Phone Note Refill Request Message from:  Patient on November 26, 2008 12:19 PM  Refills Requested: Medication #1:  ALPRAZOLAM 2 MG TABS Take 1 tablet by mouth once a day at bedtime as needed for sleep  Medication #2:  OXYCODONE-ACETAMINOPHEN 10-325 MG TABS Take 1 tablet by mouth two times a day  Method Requested: Pick up at Office Initial call taken by: Angelina Ok RN,  November 26, 2008 12:19 PM  Follow-up for Phone Call        She will need to come in and leave a urine drug screen, bring in her pill bottles as well for verification. Her last script was given on 7/27 for both meds- therefore she is not due for refills until 8/26. We can write the scripts for post dated but we need to get the UDS documented in the computer prior to doing this-ideally she needs to be seen- please see my prior note. Strict opiate contract. Follow-up by: Julaine Fusi  DO,  November 26, 2008 3:13 PM  Additional Follow-up for Phone Call Additional follow up Details #1::        Attempt to call pt no answer. Angelina Ok RN  November 26, 2008 3:44 PM  Additional Follow-up by: Angelina Ok RN,  November 26, 2008 3:44 PM    Additional Follow-up for Phone Call Additional follow up Details #2::    RTC from pt.  given instructions to bring in all bottles of meds and to give a Urine for Urine Drug Screen.  Pt said she will drop by tommorrow to leave a specimen. Follow-up by: Angelina Ok RN,  November 26, 2008 4:39 PM  Additional Follow-up for Phone Call Additional follow up Details #3:: Details for Additional Follow-up Action Taken: Will see her in Thursday AM clinic. Additional Follow-up by: Julaine Fusi  DO,  December 01, 2008 11:52 AM

## 2010-05-11 NOTE — Assessment & Plan Note (Signed)
Summary: F/U/OK PER DR GOLDING/VS   Vital Signs:  Patient profile:   50 year old female Height:      67.5 inches (171.45 cm) Weight:      373.0 pounds (169.55 kg) BMI:     57.77 Temp:     98.3 degrees F (36.83 degrees C) Pulse rate:   76 / minute BP sitting:   187 / 101  (right arm) Cuff size:   regular- forearm  Vitals Entered By: Dorie Rank RN (December 04, 2008 10:00 AM) CC: c/o arthritis in joints per orthopedic doctor, states that dr told her she might have gout in left foot- states has had h/a on and off recently and "sinus drainage down back of throat" but no cough or nasal drainage - also notes some nausea - ? if sinuses causing h/a or b/p  Is Patient Diabetic? Yes  Pain Assessment Patient in pain? yes     Location: left foot Intensity: 8 Type: aching head - foot burning Onset of pain  h/a-6 for about 1 week; foot pain chronic- knees aching chronic - 8 Nutritional Status BMI of > 30 = obese CBG Result 194  Does patient need assistance? Functional Status Self care Ambulation Impaired:Risk for fall Comments states she is just starting to be able to walk without a cane for the first time in years - walks favoring left foot but gait maintains speed and balance   CC:  c/o arthritis in joints per orthopedic doctor and states that dr told her she might have gout in left foot- states has had h/a on and off recently and "sinus drainage down back of throat" but no cough or nasal drainage - also notes some nausea - ? if sinuses causing h/a or b/p .  History of Present Illness: Maria [name] comes in for routine DM care and pain management. She reports doing much better- she is now walking and not using a wheel chair. The swelling in her ankles has much improved.  Her pain is still intermittently severe- she is taking her medicatioins as needed but essentially around the clock in order to stay mobile.    Preventive Screening-Counseling & Management  Alcohol-Tobacco  Smoking Status: current  Problems Prior to Update: 1)  Rotator Cuff Syndrome, Left  (ICD-726.10) 2)  Nevus  (ICD-216.9) 3)  Pain Management Contract  (09/01/2008) 4)  Pre-operative Cardiovascular Examination  (ICD-V72.81) 5)  Achilles Tendinitis  (ICD-726.71) 6)  Vaginal Discharge  (ICD-623.5) 7)  Contraceptive Management  (ICD-V25.09) 8)  Gerd  (ICD-530.81) 9)  Urinary Tract Infection, Recurrent  (ICD-599.0) 10)  Encounters Other Spec Administrative Purpose Oth  (ICD-V68.89) 11)  Knee Pain, Bilateral  (ICD-719.46) 12)  Upper Respiratory Infection  (ICD-465.9) 13)  Mammogram, Abnormal, Hx of  (ICD-V15.9) 14)  Chest Pain, Atypical, Hx of  (ICD-V15.89) 15)  Obstructive Sleep Apnea  (ICD-327.23) 16)  Anxiety State Nos  (ICD-300.00) 17)  Hyperlipidemia  (ICD-272.4) 18)  Diabetes Mellitus, Type II  (ICD-250.00) 19)  Tubal Ligation, Hx of  (ICD-V26.51) 20)  Allergic Rhinitis  (ICD-477.9) 21)  Dysfunctional Uterine Bleeding  (ICD-626.8) 22)  Morbid Obesity  (ICD-278.01) 23)  Hypertension  (ICD-401.9)  Medications Prior to Update: 1)  Toprol Xl 200 Mg Tb24 (Metoprolol Succinate) .... Take 1 Tablet By Mouth Once A Day 2)  Lotensin 40 Mg Tabs (Benazepril Hcl) .Marland Kitchen.. 1 By Mouth Daily 3)  Glucophage 1000 Mg Tabs (Metformin Hcl) .Marland Kitchen.. 1 By Mouth Two Times A Day 4)  Calcium 500 Mg Tabs (Calcium)  5)  Glucotrol 5 Mg Tabs (Glipizide) .... Take 1 Tablet By Mouth Two Times A Day 6)  Hydrochlorothiazide 25 Mg Tabs (Hydrochlorothiazide) .Marland Kitchen.. 1 By Mouth Daily 7)  Alprazolam 1 Mg  Tb24 (Alprazolam) .... Take 1 Tablet By Mouth Three Times A Day As Needed For Anxiety 8)  Lantus 100 Unit/ml Soln (Insulin Glargine) .... Inject 34 Units At Bedtime 9)  Bd Ultra-Fine Pen Needles 29g X 12.59mm  Misc (Insulin Pen Needle) 10)  Protonix 40 Mg  Tbec (Pantoprazole Sodium) .... Take 1 Tablet By Mouth Once A Day 11)  Voltaren 1 %  Gel (Diclofenac Sodium) .... Apply 4-8 Grams To Painful Joints Up To Two Times A Day As  Needed and As Directed. 12)  Atrovent 0.06 %  Soln (Ipratropium Bromide) .... 2 Puffs in Each Nostril Up To Three Times A Day As Needed For Nasal Congestion. 13)  Fluconazole 150 Mg  Tabs (Fluconazole) .... Take 1 Tablet By Mouth Once and May Repeat in 1 Week If Itching/dyscharge Persists 14)  Promethazine Hcl 25 Mg  Tabs (Promethazine Hcl) .... Take 1 Tablet By Mouth Every 6 Hours 15)  Vitamin D3 1000 Unit Caps (Cholecalciferol) .... Take 1 Tablet By Mouth Once A Day 16)  Oxycodone-Acetaminophen 10-325 Mg Tabs (Oxycodone-Acetaminophen) .... Take 1 Tablet By Mouth Two Times A Day 17)  Astelin 137 Mcg/spray Soln (Azelastine Hcl) .... One Spray in Each Nostril Two Times A Day As Directed 18)  Gabapentin 100 Mg Caps (Gabapentin) .... 2 Tablets At Night For 2-3 Days Then Two Times A Day Then Three Times A Day Then 4 Tablets At Night 19)  Meloxicam 15 Mg Tabs (Meloxicam) .... One By Mouth Daily - Per Dr Margaretha Sheffield 20)  Fracture Boot Medium .... Dx: Achill Tendonitis Use As Directed. Dispense 1 21)  Tb Syringe 1 Ml  Misc (Needles & Syringes) .... Use As Directed 22)  Lantus Solostar 100 Unit/ml Soln (Insulin Glargine) .... Inject 34 Units Subcutaneously At Bedtime or As Directed By Physician 23)  Lasix 20 Mg Tabs (Furosemide) .... Take 1 Tablet By Mouth Once A Day 24)  Oxycodone-Acetaminophen 5-325 Mg Tabs (Oxycodone-Acetaminophen) .... Take 1 Tablet By Mouth Four Times A Day As Needed For Pain 25)  Alprazolam 2 Mg Tabs (Alprazolam) .... Take 1 Tablet By Mouth Once A Day At Bedtime As Needed For Sleep 26)  Klor-Con 20 Meq Pack (Potassium Chloride) .... Take 1 Tablet By Mouth Once A Day  Allergies: 1)  ! Darvocet  Past History:  Past Medical History: Last updated: 02/25/2008 Hypertension Diabetes mellitus, type II Hyperlipidemia Obesity h/o dysfunctional uterine bleeding allergic rhinitis s/p tubal lugation Anxiety h/o chest pain w neg cardiolite in 2005 kidney stones with extraction  2009  Family History: Last updated: 03/20/2007 1st degree relatives with: Obesity Diabetes Hypertension CAD  Social History: Last updated: 03/20/2007 Married. No ETOH. No Tobacco. No Drugs.  Risk Factors: Exercise: no (11/04/2008)  Risk Factors: Smoking Status: current (12/04/2008)  Family History: Reviewed history from 03/20/2007 and no changes required. 1st degree relatives with: Obesity Diabetes Hypertension CAD  Social History: Reviewed history from 03/20/2007 and no changes required. Married. No ETOH. No Tobacco. No Drugs.Smoking Status:  current  Review of Systems      See HPI  Physical Exam  General:  overweight-appearing.  pleasant. NAD Lungs:  normal respiratory effort and normal breath sounds.  normal respiratory effort and normal breath sounds.   Heart:  normal rate and regular rhythm.  normal rate and regular rhythm.  Abdomen:  non-tender.  non-tender.   Msk:  ankle swelling much improved- no redness or swelling, no rashes or tenderness.  Pulses:  R and L carotid,radial,femoral,dorsalis pedis and posterior tibial pulses are full and equal bilaterally Extremities:  trace edema, pulses 2 + Neurologic:  alert & oriented X3 and cranial nerves II-XII intact.  alert & oriented X3 and cranial nerves II-XII intact.     Impression & Recommendations:  Problem # 1:  HYPERTENSION (ICD-401.9) I am concerned aboit significant elevation today. She confirms that she is inded taking her medictaion.  I recently started Lasix. Will check BMET today. Advised her to stop all NSAIDS given BP. Will increase her pain medication accordingly. Will have her hold her K until labs come back.    The following medications were removed from the medication list:    Hydrochlorothiazide 25 Mg Tabs (Hydrochlorothiazide) .Marland Kitchen... 1 by mouth daily Her updated medication list for this problem includes:    Toprol Xl 200 Mg Tb24 (Metoprolol succinate) .Marland Kitchen... Take 1 tablet by mouth  once a day    Lotensin 40 Mg Tabs (Benazepril hcl) .Marland Kitchen... 1 by mouth daily    Lasix 20 Mg Tabs (Furosemide) .Marland Kitchen... 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  Pill not done. Reminded that she must bring all pill bottles to each visit- after this visit NO EXCEPTIONS- will not fill meds without bottles and correct counts. UDS pending today. Please see prior note re: concerns for diversion.  The following medications were removed from the medication list:    Oxycodone-acetaminophen 10-325 Mg Tabs (Oxycodone-acetaminophen) .Marland Kitchen... Take 1 tablet by mouth two times a day    Meloxicam 15 Mg Tabs (Meloxicam) ..... One by mouth daily - per dr draper Her updated medication list for this problem includes:    Oxycodone-acetaminophen 10-325 Mg Tabs (Oxycodone-acetaminophen) .Marland Kitchen... Take 1 tablet by mouth three times a day  Problem # 3:  DIABETES MELLITUS, TYPE II (ICD-250.00) Refilled meds today.   The following medications were removed from the medication list:    Lantus Solostar 100 Unit/ml Soln (Insulin glargine) ..... Inject 34 units subcutaneously at bedtime or as directed by physician Her updated medication list for this problem includes:    Lotensin 40 Mg Tabs (Benazepril hcl) .Marland Kitchen... 1 by mouth daily    Glucophage 1000 Mg Tabs (Metformin hcl) .Marland Kitchen... 1 by mouth two times a day    Glucotrol 5 Mg Tabs (Glipizide) .Marland Kitchen... Take 1 tablet by mouth two times a day    Lantus 100 Unit/ml Soln (Insulin glargine) ..... Inject 34 units at bedtime  Labs Reviewed: Creat: 1.02 (08/26/2008)    Reviewed HgBA1c results: 8.1 (09/24/2008)  7.1 (02/25/2008)  Complete Medication List: 1)  Toprol Xl 200 Mg Tb24 (Metoprolol succinate)  .... Take 1 tablet by mouth once a day 2)  Lotensin 40 Mg Tabs (Benazepril hcl) .Marland Kitchen.. 1 by mouth daily 3)  Glucophage 1000 Mg Tabs (Metformin hcl) .Marland Kitchen.. 1 by mouth two times a day 4)  Glucotrol 5 Mg Tabs (Glipizide) .... Take 1 tablet by mouth two times a day 5)  Lantus 100 Unit/ml Soln (Insulin glargine) .... Inject 34 units at bedtime 6)  Protonix 40 Mg  Tbec (Pantoprazole sodium) .... Take 1 tablet by mouth once a day 7)  Vitamin D3 1000 Unit Caps (Cholecalciferol) .... Take 1 tablet by mouth once a day 8)  Gabapentin 100 Mg Caps (Gabapentin) .... 2 tablets at night for 2-3 days then two times a day then three times a day then 4 tablets at night 9)  Tb Syringe 1 Ml Misc (Needles & syringes) .... Use as directed 10)  Lasix 20 Mg Tabs (Furosemide) .... Take 1 tablet by mouth once a day 11)  Oxycodone-acetaminophen 10-325 Mg Tabs (Oxycodone-acetaminophen) .... Take 1 tablet by mouth three times a day 12)  Alprazolam 2 Mg Tabs (Alprazolam) .... Take 1 tablet by mouth two times a day 13)  Klor-con 20 Meq Pack (Potassium chloride) .... Take 1 tablet by mouth once a day  Other Orders: Capillary Blood Glucose/CBG (02725) T-Drug Screen-Urine, (single) ([id number]) Future Orders: T-Basic Metabolic Panel [id number]) ... 12/05/2008 T-Uric Acid (Blood) [phone]) ... 12/05/2008  Patient Instructions: 1)  Please schedule a follow-up appointment in 3 months. Prescriptions: OXYCODONE-ACETAMINOPHEN 10-325 MG TABS (OXYCODONE-ACETAMINOPHEN) Take 1 tablet by mouth three times a day  #90 x 0   Entered and Authorized by:   Julaine Fusi  DO   Signed by:   Julaine Fusi  DO on 12/04/2008   Method used:   Print then Give to Patient   RxID:   [id number] ALPRAZOLAM 2 MG TABS (ALPRAZOLAM) Take 1 tablet by mouth two times a day  #60 x 3   Entered and Authorized by:   Julaine Fusi  DO   Signed by:   Julaine Fusi  DO on 12/04/2008   Method used:   Print then Give to Patient   RxID:    [id number]  Process Orders Check Orders Results:     [facility]: Check successful Tests Sent for requisitioning (December 05, 2008 1:55 PM):     12/05/2008: [facility] -- T-Basic Metabolic Panel [phone] (signed)     12/05/2008: [facility] -- T-Uric Acid (Blood) [[id number]] (signed)     12/04/2008: [facility] -- T-Drug Screen-Urine, (single) [[id number]] (signed)    Prevention & Chronic Care Immunizations   Influenza vaccine: Not documented   Influenza vaccine due: 12/10/2008    Tetanus booster: Not documented   Td booster deferral: Deferred  (09/24/2008)    Pneumococcal vaccine: Not documented  Other Screening   Pap smear: Normal  (01/04/2006)   Pap smear action/deferral: Deferred-2 yr interval  (09/24/2008)    Mammogram: Normal  (01/04/2006)   Mammogram action/deferral: Deferred-2 yr interval  (09/24/2008)   Smoking status: current  (12/04/2008)  Diabetes Mellitus   HgbA1C: 8.1  (09/24/2008)   Hemoglobin A1C due: 12/25/2008    Eye exam: Not documented   Diabetic eye exam action/deferral: Ophthalmology referral  (09/24/2008)    Foot exam: yes  (02/25/2008)   High risk foot: Not documented   Foot care education: 05/09/2006  (10/24/2006)   Foot exam due: 01/11/2007    Urine microalbumin/creatinine ratio: 7.4  (02/25/2008)  Lipids   Total Cholesterol: 159  (08/26/2008)   LDL: 78  (08/26/2008)   LDL Direct: Not documented   HDL: 67  (08/26/2008)   Triglycerides: 70  (08/26/2008)   Lipid panel due: 05/10/2006    SGOT (AST): 11  (08/26/2008)   SGPT (ALT): 9  (08/26/2008)   Alkaline phosphatase: 88  (08/26/2008)   Total bilirubin: 0.4  (08/26/2008)  Hypertension   Last Blood Pressure: 187 / 101  (12/04/2008)  Serum creatinine: 1.02  (08/26/2008)   Serum potassium 4.5  (08/26/2008)  Self-Management Support :   Personal Goals (by the next clinic visit) :     Personal A1C goal: 7   (09/24/2008)     Personal blood pressure goal: 130/80  (09/24/2008)     Personal LDL goal: 70  (09/24/2008)    Patient will work on the following items until the next clinic visit to reach self-care goals:     Medications and monitoring: take my medicines every day, bring all of my medications to every visit, examine my feet every day  (12/04/2008)     Eating: drink diet soda or water instead of juice or soda, eat more vegetables, eat baked foods instead of fried foods, eat fruit for snacks and desserts  (12/04/2008)     Other: has weight surgery request for [place]-going to PT - may try water therapy  (12/04/2008)    Diabetes self-management support: Written self-care plan, Referred for DM self-management training  (09/24/2008)    Hypertension self-management support: Written self-care plan  (09/24/2008)    Lipid self-management support: Written self-care plan  (09/24/2008)

## 2010-05-11 NOTE — Progress Notes (Signed)
Summary: med refill/wl  Phone Note Refill Request Message from:  Fax from Pharmacy on May 02, 2006 12:27 PM  Refills Requested: Medication #1:  VICODIN 5-500 MG TABS 1 by mouth daily   Notes: refill request is for Vicodin 7.5/750 take qhs prn pain, #40, last refilled 04/08/06  Medication #2:  ATIVAN 1 MG TABS 1po three times a day prn   Dosage confirmed as above?Dosage Confirmed   Last Refilled: 04/10/2006 Pt. [facility] with Dr. Frederico Hamman 04/24/06   Method Requested: Fax to Local Pharmacy Initial call taken by: Dorene Sorrow RN,  May 02, 2006 12:35 PM  Follow-up for Phone Call Details for Follow-up Action Taken: This exact dose of vicodin, 7.5/750 at bedtime is not actually in the recent notes, but she weighs 380 lbs, has had knee surgery, has likely received prior prescriptions from an orthopod.  Have discussed this with Dr. Frederico Hamman.  Will refill now and Dr. Frederico Hamman will follow up how she is doing.  Refill approved-nurse to complete Follow-up by: Ulyess Mort MD,  May 02, 2006 3:33 PM  Additional Follow-up for Phone Call Details for Additional Follow-up Action Taken: OK to refill Ativan?  Rx faxed to pharmacy Additional Follow-up by: Dorene Sorrow RN,  May 02, 2006 5:11 PM  New/Updated Medications: VICODIN ES 7.5-750 MG TABS (HYDROCODONE-ACETAMINOPHEN) Take 1 tablet by mouth at bedtime as needed for pain  Prescriptions: ATIVAN 1 MG TABS (LORAZEPAM) 1po three times a day prn  #93 x 2   Entered and Authorized by:   Ulyess Mort MD   Signed by:   Ulyess Mort MD on 05/03/2006   Method used:   Telephoned to ...         RxID:   [id number] VICODIN ES 7.5-750 MG TABS (HYDROCODONE-ACETAMINOPHEN) Take 1 tablet by mouth at bedtime as needed for pain  #40 x 2   Entered and Authorized by:   Ulyess Mort MD   Signed by:   Ulyess Mort MD on 05/02/2006   Method used:   Telephoned to ...         RxID:   [id number]

## 2010-05-11 NOTE — Miscellaneous (Signed)
Summary: [facility]: [facility]  [facility]   Imported By: Florinda Marker 03/05/2008 14:46:07  _____________________________________________________________________  External Attachment:    Type:   Image     Comment:   External Document

## 2010-05-11 NOTE — Progress Notes (Signed)
----   Converted from flag ---- ---- 10/17/2006 9:38 PM, Ronda Fairly MD wrote: Patient will need to be seen in the clinic. I have told her that she will be called Could you please call the patient   Ronda Fairly ------------------------------  we have tried to notify pt several times being unable to reach her. ..................................................................Marland KitchenMarin Roberts RN  October 23, 2006 9:38 AM

## 2010-05-11 NOTE — Progress Notes (Signed)
Summary: After hours request for anxiety meds denied  Phone Note Call from Patient   Caller: Patient Reason for Call: Refill Medication Summary of Call: Pt call requesting an earlier refill of her alprazolam. She reported having a family situation keeping her tense and more anxious than usual. She has been using her alprazolam more often and because of that ran out before next refill due date. Since she is due for her alprazolam on 01/27/09 will authorized an earlier refill and will ask her to make an apponitment at her earliest convenience.... Pt agree and will follow instructions as written on the bottle to avoid running out before due date.

## 2010-05-11 NOTE — Progress Notes (Signed)
Summary: NO REFILLS ON OPIATES OR BENZOS     Phone Note Call from Patient   Caller: Patient Summary of Call: Caller: Patient  Pt called and states she is  getting  oxycodone 10-325 mg tid , this was not relieving her pain so she is taking 2 at a time.  She was told by Dr Phillips Odor that if oxycodone is not working she would change to a long acting med. Pt # [id number]  Initial call taken by: Merrie Roof RN,  January 19, 2009 3:44 PM  Follow-up for Phone Call        She will need to be seen in clinic, and should bring all of her medication bottles with her. Follow-up by: Margarito Liner MD,  January 19, 2009 5:08 PM  Additional Follow-up for Phone Call Additional follow up Details #1::        Pt prefers to wait until Dr Phillips Odor is back to get the med change.  They  had talked about it, so may not require a visit. Dr Phillips Odor to review Additional Follow-up by: Merrie Roof RN,  January 20, 2009 11:18 AM    Additional Follow-up for Phone Call Additional follow up Details #2::    As of 12/10/08 pt is NOT to receive any opiates/benzos even if stolen, even if she has a police repo.  Per Dr Phillips Odor

## 2010-05-11 NOTE — Miscellaneous (Signed)
  Clinical Lists Changes Pt. called [phone] pager saying that she is hurting really bad because of kidney stone. Her dilaudid prescribed from the ER ran out as she was taking 2 pills at a time, instead of 1. She is now taking 8 pills daily. One of her regular medications from EMR is Vicodin 7.5/750, which was refilled in 6/18 with 3 more refills. I asked her to refill it, but her pharmacy will be closed today. I asked her to go to ER, which she agrees.

## 2010-05-11 NOTE — Progress Notes (Signed)
Summary: Phone: Sore Throat/Cough-Requesting symptomatic rx  Phone Note Call from Patient Call back at [phone]   Caller: Patient Reason for Call: Acute Illness Complaint: Cough/Sore throat, Headache Summary of Call: S/T and Cough x 1 day. Mild nausea, mild diarrhea. Mild Headache. No myalgias quite yet.  Asking for some meds for symptom relief.   Goes to [facility] on [address] [phone].   Initial call taken by: Coralie Carpen MD,  June 13, 2006 8:58 PM  Follow-up for Phone Call        Rx filled in Dr. Tiajuana Amass Pt advised to call clinic in a few days if no relief  Encourage increased fluid intake.  Follow-up by: Coralie Carpen MD,  June 13, 2006 9:14 PM  Additional Follow-up for Phone Call Additional follow up Details #1::        Dr. Frederico Hamman, Pt. called into clinic this morning requesting cough syrup with codeine that you have given to her in the past.  She has taken two of the Tessalon caps.  She says her chest hurts on inspiration, possibly due to a dry cough.  She does not have fever.  She still c/o of mild nausea and diarrhea, also some sinus drainage.  I instructed her to ask her pharmacist for an OTC med for sinus congestion/drainage.  Please advise.    Additional Follow-up by: Dorene Sorrow RN,  June 14, 2006 10:52 AM  New/Updated Medications: GUAIFENESIN 100 MG/5ML SYRP (GUAIFENESIN) Take two teaspoonfuls every four hours as needed for cough congestion TESSALON PERLES 100 MG CAPS (BENZONATATE) Take one cap three times a day as needed for cough and sore throat TUSSIONEX PENNKINETIC ER 8-10 MG/5ML LQCR (CHLORPHENIRAMINE-HYDROCODONE) Take one teaspoon every 12 hours as needed for cough  Additional Follow-up for Phone Call Additional follow up Details #2::    Pt called again this evening and clarified responses so far to [employer] and Generic Guafenisin. She has noted more mucus production but cough is still painful and irritating. She was counseled on the likelihood that even  additional therapy may not make much of a difference, but as long as she was willing to come for an appt later this week would we try additional therapy. She agreed.  Tussinonex 1 tsp q12h as needed ordered on Dr. Tiajuana Amass. Follow-up by: Coralie Carpen MD,  June 14, 2006 7:53 PM  New/Updated Medications: GUAIFENESIN 100 MG/5ML SYRP (GUAIFENESIN) Take two teaspoonfuls every four hours as needed for cough congestion TESSALON PERLES 100 MG CAPS (BENZONATATE) Take one cap three times a day as needed for cough and sore throat TUSSIONEX PENNKINETIC ER 8-10 MG/5ML LQCR (CHLORPHENIRAMINE-HYDROCODONE) Take one teaspoon every 12 hours as needed for cough  Prescriptions: TUSSIONEX PENNKINETIC ER 8-10 MG/5ML LQCR (CHLORPHENIRAMINE-HYDROCODONE) Take one teaspoon every 12 hours as needed for cough  #100 x 0   Entered and Authorized by:   Coralie Carpen MD   Signed by:   Coralie Carpen MD on 06/14/2006   Method used:   Print then Give to Patient   RxID:   [id number] TESSALON PERLES 100 MG CAPS (BENZONATATE) Take one cap three times a day as needed for cough and sore throat  #42 x 0   Entered and Authorized by:   Coralie Carpen MD   Signed by:   Coralie Carpen MD on 06/13/2006   Method used:   Print then Give to Patient   RxID:   [id number] GUAIFENESIN 100 MG/5ML SYRP (GUAIFENESIN) Take two teaspoonfuls every four hours as needed for  cough congestion  #680ml x 0   Entered and Authorized by:   Coralie Carpen MD   Signed by:   Coralie Carpen MD on 06/13/2006   Method used:   Print then Give to Patient   RxID:   [id number]

## 2010-05-11 NOTE — Assessment & Plan Note (Signed)
Summary: DIABETES INSULIN INSTRUCTION/PER DR GOLDING/VS   Vital Signs:  Patient Profile:   50 Years Old Female Height:     69 inches (175.26 cm) Weight:      375.01 pounds BMI:     55.58 Temp:     98 degrees F oral Pulse rate:   118 / minute BP sitting:   140 / 92  (right arm)  Pt. in pain?   yes    Location:   knee    Intensity:   8    Type:       aching  Vitals Entered By: Angelina Ok RN (October 23, 2006 8:31 AM)              Is Patient Diabetic? Yes  Nutritional Status BMI of > 30 = obese CBG Result 262  Have you ever been in a relationship where you felt threatened, hurt or afraid?No   Does patient need assistance? Functional Status Self care Ambulation Normal   Chief Complaint:  Diabetes instruction.  History of Present Illness: Ms. Carollee Sires returns to the office today for follow up on her labs and for initiation of insulin therapy for her diabetes. At her last visit 3 days ago she was found to have increased CBG's and issues with medication adherence. She has no new complaints today but does report improvement in her upper respiratory tract infection symptoms from the prior week.  Current Allergies: ! DARVOCET  Past Medical History:    Reviewed history from 01/11/2006 and no changes required:       Hypertension       Diabetes mellitus, type II       Hyperlipidemia       Obesity       h/o dysfunctional uterine bleeding       allergic rhinitis       s/p tubal lugation       Anxiety       h/o chest pain w neg cardiolite in 2003    Risk Factors:  Tobacco use:  never   Review of Systems       Denies fever, chills, nausea, vommitting and diarrhea.   Physical Exam  General:     alert, well-developed, and overweight-appearing.   Lungs:     scattered ronchi. good air movement. no wheezing. Heart:     Normal rate and regular rhythm. S1 and S2 normal without gallop, murmur, click, rub or other extra sounds. Abdomen:     Bowel sounds positive,abdomen  soft and non-tender  Pulses:     2+ DP Extremities:     trace edema    Impression & Recommendations:  Problem # 1:  DIABETES MELLITUS, TYPE II (ICD-250.00) Patient here this morning for F/U on DM2 and insulin instruction. She is requesting to meet with Jamison Neighbor. WIll give Lantus 6 units for now and schedule her for the next available appointment with DM education. At that time she will be taught self titration of Lantus.  Patient is agreeable to the treatment plan and has a posive attitude about gaining better control of her Diabetes Mellitus Type 2.  F/U in 1 month- will continue current home meds.   Her updated medication list for this problem includes:    Lotensin 40 Mg Tabs (Benazepril hcl) .Marland Kitchen... 1 by mouth daily    Glucophage 1000 Mg Tabs (Metformin hcl) .Marland Kitchen... 1 by mouth two times a day    Glucotrol 5 Mg Tabs (Glipizide) .Marland Kitchen... 1po bid    Lantus  Solostar 100 Unit/ml Soln (Insulin glargine)  Orders: Capillary Blood Glucose (43329) Fingerstick (51884) EMR Misc Charge Code [facility]) Diabetic Clinic Referral (Diabetic) Admin of Therapeutic Inj  intramuscular or subcutaneous (16606) Insulin 5 units (T0160)   Medications Added to Medication List This Visit: 1)  Lantus Solostar 100 Unit/ml Soln (Insulin glargine) 2)  Bd Ultra-fine Pen Needles 29g X 12.72mm Misc (Insulin pen needle)   Patient Instructions: 1)  Please schedule appointment with Jamison Neighbor tomorrow.         Medication Administration  Injection # 1:    Medication: Insulin 5 units    Diagnosis: DIABETES MELLITUS, TYPE II (ICD-250.00)    Route: SQ    Site: Abdomen    Exp Date: 04/2008    Lot #: [id number]    Mfr: Sanofi Pasteur    Patient tolerated injection without complications    Given by: Angelina Ok RN (October 23, 2006 9:06 AM)  Orders Added: 1)  Capillary Blood Glucose [82948] 2)  Fingerstick [36416] 3)  EMR Misc Charge Code [EMRMisc] 4)  Diabetic Clinic Referral [Diabetic] 5)  Admin of  Therapeutic Inj  intramuscular or subcutaneous [90772] 6)  Insulin 5 units [J1815] 7)  Est. Patient Level III [55732]

## 2010-05-11 NOTE — Progress Notes (Signed)
Summary: Medication assistance  Phone Note Outgoing Call   Call placed by: Angelina Ok RN,  Aug 17, 2006 11:27 AM Call placed to: Patient Summary of Call: RTC to pt.  Pt had called stated no longer has insurance and wanted assistance in getting meds.  No answer at number left by pt.  Plan to have pt speak with Concepcion Elk for assistance with medications. RTC from pt will bring in all medications as well as qualifying information for [facility] Certification and pt assistance...................................................................Marland KitchenAngelina Ok RN  Aug 17, 2006 4:46 PM  Initial call taken by: Angelina Ok RN,  Aug 17, 2006 11:29 AM

## 2010-05-11 NOTE — Progress Notes (Signed)
Summary: phone/gg    Phone Note Call from Patient   Summary of Call: Pt called back with c/o increase pain in foot.  c/o swelling to rt foot and difficulty walking.   onset 4 days ago.  She feels it's neuropathy and has been treated with prednisone in past.  She wants another dose pack. She has a scheduled appointment on Wednesday but wants something before then. Please advise.  Pt # [id number] Initial call taken by: Merrie Roof RN,  September 28, 2009 2:13 PM  Follow-up for Phone Call        I have never used steroids for diabetic neuropathy.  So I looked at UTD to see if I simply was unaware of the use of steroid and they are not indicated.  plus with DM, steroids really are not indicated unless absolutely neccassary.  PT is on gabapentin for DM neuropathy.  Agree that pt needs eval on Wed.  I can't phone in anything earlier.     Follow-up by: Blanch Media MD,  September 28, 2009 3:35 PM  Additional Follow-up for Phone Call Additional follow up Details #1::        Pt informed Additional Follow-up by: Merrie Roof RN,  September 28, 2009 3:49 PM

## 2010-05-11 NOTE — Miscellaneous (Signed)
Summary: phone call boil  Prescriptions: BACTRIM DS 800-160 MG TABS (SULFAMETHOXAZOLE-TRIMETHOPRIM) Take two tablets two times a day for skin infection.  #28 x 0   Entered and Authorized by:   Elby Showers MD   Signed by:   Elby Showers MD on 08/03/2008   Method used:   Electronically to        [facility] [address] [phone]* (retail)       [address]       [address]       [place], [place]  [address]       Ph: [phone]       Fax: [phone]   RxID:   [id number] BACTRIM DS 800-160 MG TABS (SULFAMETHOXAZOLE-TRIMETHOPRIM) Take two tablets two times a day for skin infection.  #28 x 0   Entered and Authorized by:   Elby Showers MD   Signed by:   Elby Showers MD on 08/03/2008   Method used:   Electronically to        [facility] [address] [phone]* (retail)       [address]       [address]       [place], [place]  [address]       Ph: [phone]       Fax: [phone]   RxID:   [id number] BACTRIM DS 800-160 MG TABS (SULFAMETHOXAZOLE-TRIMETHOPRIM) Take two tablets two times a day for skin infection.  #28 x 0   Entered and Authorized by:   Elby Showers MD   Signed by:   Elby Showers MD on 08/03/2008   Method used:   Electronically to        [facility] [address] [phone]* (retail)       [address]       [address]       [place], [place]  [address]       Ph: [phone]       Fax: [phone]   RxID:   [id number] BACTRIM DS 800-160 MG TABS (SULFAMETHOXAZOLE-TRIMETHOPRIM) Take two tablets two times a day for skin infection.  #28 x 0   Entered and Authorized by:   Elby Showers MD   Signed by:   Elby Showers MD on 08/03/2008   Method used:   Electronically to        [facility] [address] [phone]* (retail)       [address]       [address]       [place], [place]  [address]       Ph: [phone]       Fax: [phone]   RxID:   [id number]  Pt called to report that she has a large boil in her groin area.  She has had  it for several days, been applying hot compressed and it has continued to get larger.  She reports it is larger than a quarter and raised.  There has been no drainage.  She has had no fevers, chills, n/v/d.  Skin irratation is confined to one area only.  She reports that she has had this problem in the past, was given antibiotics and it resolved.  I do not see any records of this in her chart.  She agrees to call the clinic first thing in the morning so that this issue can be assessed, i.e. if it it is indeed a boil (I think it is from her discription) and whether it needs to be drained.  She  would like to start treatment right away as it is very painful.  She is not allergic to any medications.  This is a recurrence and she has comorbid diabetes.  I have agreed to call in a 7 day course of bactrim and asked that she be sure to call the clinic.

## 2010-05-11 NOTE — Letter (Signed)
Summary: [facility]: Appt. Notice  [facility]: Appt. Notice   Imported By: Florinda Marker 04/23/2009 13:51:13  _____________________________________________________________________  External Attachment:    Type:   Image     Comment:   External Document

## 2010-05-11 NOTE — Progress Notes (Signed)
Summary: refill/ hla  Phone Note Refill Request Message from:  Fax from Pharmacy on July 17, 2008 4:51 PM  Refills Requested: Medication #1:  FLUCONAZOLE 150 MG  TABS take 1 tablet by mouth once and may repeat in 1 week if itching/dyscharge persists   Last Refilled: 3/22 Initial call taken by: Marin Roberts RN,  July 17, 2008 4:51 PM  Follow-up for Phone Call        Refill approved-nurse to complete Follow-up by: Julaine Fusi  DO,  July 17, 2008 11:51 PM      Prescriptions: FLUCONAZOLE 150 MG  TABS (FLUCONAZOLE) take 1 tablet by mouth once and may repeat in 1 week if itching/dyscharge persists  #1 x 0   Entered and Authorized by:   Julaine Fusi  DO   Signed by:   Julaine Fusi  DO on 07/17/2008   Method used:   Electronically to        [facility] [address] [phone]* (retail)       [address]       [address]       [place], [place]  [address]       Ph: [phone]       Fax: [phone]   RxID:   [id number]

## 2010-05-11 NOTE — Progress Notes (Signed)
  Phone Note Call from Patient   Summary of Call: Patient called with nausea, vomiting. GI virus in family members. She was instructed on signs of a more serious problem and when to be evaluated if symptoms get worse or do not improve.    New/Updated Medications: PROMETHAZINE HCL 25 MG  TABS (PROMETHAZINE HCL) Take 1 tablet by mouth every 6 hours   Prescriptions: PROMETHAZINE HCL 25 MG  TABS (PROMETHAZINE HCL) Take 1 tablet by mouth every 6 hours  #40 x 0   Entered and Authorized by:   Julaine Fusi  DO   Signed by:   Julaine Fusi  DO on 09/25/2007   Method used:   Electronically sent to ...       [facility]       [address]       [place], [place]  [address]       Ph: [phone]       Fax: [phone]   RxID:   [id number]

## 2010-05-11 NOTE — Miscellaneous (Signed)
  Clinical Lists Changes foot cramping w pain on top last few days. flexeril did not help. has been moving and thus more active will try soma and see her Monday pm  she is ok w this plan Medications: Added new medication of SOMA 350 MG TABS (CARISOPRODOL) generic please sig one by mouth three times a day as needed foot cramping - Signed Rx of SOMA 350 MG TABS (CARISOPRODOL) generic please sig one by mouth three times a day as needed foot cramping;  #30 x 0;  Signed;  Entered by: Norton Blizzard MD;  Authorized by: Denny Levy MD;  Method used: Electronically to [facility] [address] [phone]*, [address], [address] [place], [place]  [address], Ph: [phone], Fax: [phone]    Prescriptions: SOMA 350 MG TABS (CARISOPRODOL) generic please sig one by mouth three times a day as needed foot cramping  #30 x 0   Entered by:   Norton Blizzard MD   Authorized by:   Denny Levy MD   Signed by:   Norton Blizzard MD on 10/31/2008   Method used:   Electronically to        [facility] [address] [phone]* (retail)       [address]       [address]       [place], [place]  [address]       Ph: [phone]       Fax: [phone]   RxID:   [id number]

## 2010-05-11 NOTE — Progress Notes (Signed)
Summary: Refill/gh  Phone Note Refill Request Message from:  Patient on April 07, 2009 2:47 PM  Refills Requested: Medication #1:  ALPRAZOLAM 2 MG TABS Take 1 tablet by mouth an needed at bedtime. Call from pt wants a refill says it is due on Friday.  Has an appointment with Dr. Phillips Odor in late January.   Method Requested: Fax to Local Pharmacy Initial call taken by: Angelina Ok RN,  April 07, 2009 2:48 PM  Follow-up for Phone Call        No phone refills for opiates or xanax in this patient- ever- she again took meds NOT as prescribed. . She will have to come in sooner and a nurse or doctor will have to verify her pill bottle and the fill date and record number of pills in bottle - she will then have to give a urine sample. Otherwise no refills. Please let her know that when she sees me in January that I will not be refilling her medications with out pill bottles and proper pill counts. She is on contract- offer to mail her a copy of what she signed. Remind her that this is our clinic policy when on contract. Thanks. Follow-up by: Julaine Fusi  DO,  April 08, 2009 3:10 PM  Additional Follow-up for Phone Call Additional follow up Details #1::        Call to pt informed of need to bring in botles of medicine at next appointment and the she will receive no further refills of her opiates or xanax.  Pt advised to keep appointment with Dr. Phillips Odor.  Pt voiced understanding of plan. Additional Follow-up by: Angelina Ok RN,  April 08, 2009 5:33 PM

## 2010-05-11 NOTE — Progress Notes (Signed)
Summary: Refill/gh  Phone Note Refill Request Message from:  Pharmacy on April 09, 2007 10:58 AM  Refills Requested: Medication #1:  HYDROCHLOROTHIAZIDE 25 MG TABS 1 by mouth daily   Last Refilled: 12/12/2006 Wants # 100  Initial call taken by: Angelina Ok RN,  April 09, 2007 10:58 AM  Follow-up for Phone Call        Refill approved-nurse to complete Follow-up by: Margarito Liner MD,  April 09, 2007 1:45 PM  Additional Follow-up for Phone Call Additional follow up Details #1::        Rx faxed to pharmacy Additional Follow-up by: Angelina Ok RN,  April 09, 2007 4:09 PM      Prescriptions: HYDROCHLOROTHIAZIDE 25 MG TABS (HYDROCHLOROTHIAZIDE) 1 by mouth daily  #100 x 0   Entered and Authorized by:   Margarito Liner MD   Signed by:   Margarito Liner MD on 04/09/2007   Method used:   Telephoned to ...       [facility]       [address] [place], [place]  [address]       Ph: [phone]       Fax: [phone]   RxID:   [id number]

## 2010-05-11 NOTE — Progress Notes (Signed)
  Phone Note Call from Patient   Summary of Call: Received page from patient on [phone]. Patient reports that she used to take Klonopin for her anxiety and that has been changed to Chlordiazeperoxide recently, which is not helping at all. She is requesting if I could refill her Klonopin.  I said we cannot refill after hours.  I recommended to call the clinic on Monday. Will forward this note to Dr. Phillips Odor.  Initial call taken by: Blondell Reveal MD,  January 22, 2010 8:02 PM
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
## 2010-05-11 NOTE — Progress Notes (Signed)
Summary: Kidney stones  Phone Note Call from Patient   Caller: Patient Call For: Maria Fusi  DO Summary of Call: Call from pt.  Staes needs sugery for her Kidney Stones unable to do due to no insurance would like to talk with Dr. Phillips Odor.  Attempts to call pt at [phone].  Unable to leave a message. Angelina Ok RN  November 07, 2007 10:16 AM   Follow-up for Phone Call        pt called today, had lithotripsy 7/29, wanted a hfu w/ dr Phillips Odor, all appts for aug w/ dr Phillips Odor are full, not sure a f/u here is needed, instructed pt to definitely f/u w/dr who did procedure Follow-up by: Marin Roberts RN,  November 08, 2007 10:50 AM  Additional Follow-up for Phone Call Additional follow up Details #1::        may add her on to my schedule. thanks. Additional Follow-up by: Maria Fusi  DO,  November 13, 2007 2:25 PM    Additional Follow-up for Phone Call Additional follow up Details #2::    Please make sure this is only called in by one person since tehre are multiple people helping with this issue! Thanks Follow-up by: Maria Fusi  DO,  November 23, 2007 2:57 PM    Prescriptions: VICODIN ES 7.5-750 MG TABS (HYDROCODONE-ACETAMINOPHEN) Take 1 tablet by mouth two times a day  #120 x 0   Entered and Authorized by:   Maria Fusi  DO   Signed by:   Maria Fusi  DO on 11/23/2007   Method used:   [facility] ...       [facility] [phone][facility]       [address]       [place], [place]  [address]       Ph: [phone] or [phone]       Fax: [phone]   RxID:   [id number] VICODIN ES 7.5-750 MG TABS (HYDROCODONE-ACETAMINOPHEN) Take 1 tablet by mouth two times a day  #120 x 0   Entered and Authorized by:   Maria Fusi  DO   Signed by:   Maria Fusi  DO on 11/23/2007   Method used:   Electronically sent to ...       [facility] [address] [phone]*       [address]       [place], [place]  [address]   Ph: [phone] or [phone]       Fax: [phone]   RxID:   [id number]

## 2010-05-11 NOTE — Progress Notes (Signed)
Summary: Vicodin  Phone Note Outgoing Call   Call placed by: Angelina Ok RN,  January 04, 2007 8:55 AM Call placed to: Patient Summary of Call: Call to pt to inform that Dr. Phillips Odor had written prescritption for Vicodin 5/500 and that prescription would be called to the [facility] on [address].  Pt voiced understanding of information given.   Angelina Ok, RN January 04, 2007 8:55 AM Prescripton for Vicodin 5/500 1 by mouth every 6 hours #90 with 2 refills per order of Dr. Phillips Odor called to [facility].  Angelina Ok, RN September 9:15 AM

## 2010-05-11 NOTE — Progress Notes (Signed)
Summary: phone/gg    Phone Note Call from Patient   Summary of Call: Pt called to let you know she went to ortho. MD today and she was put on prednisone for 12 days.  She is worried about CBG's.  Should she change her meds ? Pt # [id number] Initial call taken by: Merrie Roof RN,  November 26, 2008 10:55 AM  Follow-up for Phone Call        she should probably increase her Lantus insulin by 5 units daily Follow-up by: Julaine Fusi  DO,  November 26, 2008 3:12 PM  Additional Follow-up for Phone Call Additional follow up Details #1::        increase to 5 units until off prednisone  Additional Follow-up by: Julaine Fusi  DO,  November 26, 2008 3:23 PM    Additional Follow-up for Phone Call Additional follow up Details #2::    Pt has been informed and voices understanding.  She will check Sugars often. Follow-up by: Merrie Roof RN,  November 26, 2008 4:12 PM

## 2010-05-11 NOTE — Progress Notes (Signed)
  Phone Note Outgoing Call   Call placed by: Theotis Barrio NT II,  March 19, 2009 3:42 PM Call placed to: Patient Details for Reason: GYN APPT Summary of Call: CALLED PATIENT TO GIVE HER THE APPT, PHONE NUMBER IS DISCONNECTED. LELA STURDIVANT NTII

## 2010-05-11 NOTE — Miscellaneous (Signed)
Summary: Medical Surgical Procedures  Medical Surgical Procedures   Imported By: Florinda Marker 09/26/2008 14:27:15  _____________________________________________________________________  External Attachment:    Type:   Image     Comment:   External Document

## 2010-05-11 NOTE — Progress Notes (Signed)
  Phone Note Outgoing Call   Summary of Call: Maria [name] please call her and tel;l her that her uric acid was high so I am pretty sure gout is our problem. Her kidney function looked ok--her blood sugar was up a bit at 399. Thanks!  Denny Levy MD  October 19, 2009 2:07 PM   Follow-up for Phone Call        called pt and gave her the information. she wants to know if she can have a refill on her soma Follow-up by: Lillia Pauls CMA,  October 21, 2009 10:20 AM  Additional Follow-up for Phone Call Additional follow up Details #1::        Maria [name] I have sent a rx for #30. She has not had it refilled since Oct so I don't think she is using it too often. Plz tell her rx is there, continue to use it sparingly as she is doing Thanks!  Denny Levy MD  October 21, 2009 1:56 PM     Additional Follow-up for Phone Call Additional follow up Details #2::    done Follow-up by: Lillia Pauls CMA,  October 22, 2009 10:25 AM  Prescriptions: CARISOPRODOL 350 MG TABS (CARISOPRODOL) one by mouth three times a day  #30 Each x 0   Entered and Authorized by:   Denny Levy MD   Signed by:   Denny Levy MD on 10/21/2009   Method used:   [facility]        [facility]  [address] [phone]* (retail)       [address]       [place], [place]  [address]       Ph: [phone] or [phone]       Fax: [phone]   RxID:   [id number]

## 2010-05-11 NOTE — Progress Notes (Signed)
Summary: requesting pain meds  Phone Note Call from Patient Call back at Work Phone [phone]   Summary of Call: Patient called saying the numbness in Left arm is bothering her a lot and wanting some pain meds. Explained to patient pain meds won't help, needs to come in for evaluation to the ER or clinic. Patient voices understanding.
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
## 2010-05-11 NOTE — Miscellaneous (Signed)
  Pt called at 11:30 pm tonight to say that she was d/c'd today from the hospital (kidney stone) and she is still unable to urinate. Last time she urinated at 1 pm and she feels full.  She does not know the name or telephone nr. of the Urologist that she should have had the f/u with. It is not in emr or the e-chart. From the e-chart, u/s showed a minimum hydro and an 11 mm L kidney stone. I told her that the best thing is to come to the ED but she says she would not want to come now. I advise her to call the clinic early tomorrow, then or come to the ED early in am..........................................................Marland KitchenCarlus Pavlov MD  May 21, 2007 11:41 PM    Clinical Lists Changes  Appended Document:  Patient passed stone this am. Urology appt for 2/10 at 11AM.

## 2010-05-11 NOTE — Progress Notes (Signed)
Summary: med refill/gp  Phone Note Refill Request Message from:  Fax from Pharmacy on March 23, 2007 11:01 AM  Refills Requested: Medication #1:  LOTENSIN 40 MG TABS 1 by mouth daily   Last Refilled: 11/10/2006 Initial call taken by: Chinita Pester RN,  March 23, 2007 11:01 AM  Follow-up for Phone Call        approved   Additional Follow-up for Phone Call Additional follow up Details #1::        Refill called to [facility]. Additional Follow-up by: Chinita Pester RN,  March 23, 2007 1:43 PM      Prescriptions: LOTENSIN 40 MG TABS (BENAZEPRIL HCL) 1 by mouth daily  #30 x 6   Entered and Authorized by:   Julaine Fusi  DO   Signed by:   Julaine Fusi  DO on 03/23/2007   Method used:   Telephoned to ...       [facility]       [address] [place], [place]  [address]       Ph: [phone]       Fax: [phone]   RxID:   [id number]

## 2010-05-11 NOTE — Progress Notes (Signed)
Summary: refillhla  Phone Note Refill Request Message from:  Patient on April 09, 2008 2:39 PM  Refills Requested: Medication #1:  PROTONIX 40 MG  TBEC Take 1 tablet by mouth once a day Initial call taken by: Marin Roberts RN,  April 09, 2008 2:40 PM  Follow-up for Phone Call        Refill approved-nurse to complete Follow-up by: Julaine Fusi  DO,  April 14, 2008 6:53 AM      Prescriptions: PROTONIX 40 MG  TBEC (PANTOPRAZOLE SODIUM) Take 1 tablet by mouth once a day  #30 x 11   Entered and Authorized by:   Julaine Fusi  DO   Signed by:   Julaine Fusi  DO on 04/14/2008   Method used:   Electronically to        [facility] #[id number]* (retail)       [address] [place], [place]  [address]       Ph: [phone]       Fax: [phone]   RxID:   [id number]

## 2010-05-11 NOTE — Assessment & Plan Note (Signed)
Summary: FOOT PAIN PER DR. NEAL/MJD   Vital Signs:  Patient profile:   50 year old female Pulse rate:   130 / minute BP sitting:   152 / 91  (right arm)  Vitals Entered By: Terese Door (November 03, 2008 4:19 PM) CC: right foot pain/swelling   CC:  right foot pain/swelling.  History of Present Illness: 1)  worsening right knee and foot pain. Both LE are swelling more in the last few months--says it happens every summer--but right foot seems swollen and is hurting. denies any increase in SOB or DOE, no chest pains. Right foot huts mostly on dorsum--it is 7-8/10 right now. R knee has been hurting even at rest--6-7/10. Is on pain contract w her PCP Dr Phillips Odor.  2): her achilles tendon area is totally painless now  3) her shoulder (left0 is 90% better after we did injection at last visit.   PMH pertinent for severe DJD B knees, currently non operative candidate secondary to obesity (per report by pt of her last few visits to ortho). Considering bariatric surgery    Allergies: 1)  ! Darvocet  Review of Systems       The patient complains of peripheral edema and difficulty walking.  The patient denies weight loss, weight gain, chest pain, syncope, dyspnea on exertion, abdominal pain, severe indigestion/heartburn, and muscle weakness.    Physical Exam  General:  overweight-appearing.  seated in wheelchair, unable to bear weight right leg secondary to pain in knee and foot Msk:  1+ non  pitting edema B LE to mid shin. The dorsum of right foot is a little puffier then left but not significantly so. She has normal neurovascular status B LE w good cap refill, normal skin color and temp. Dorsum right foot is mildy-moderately tender to palpation in a diffuse area. No specific locale.  R Knee has slight effusion, is tender medial and laterla joint lines. She has pain with extension--can reach full extension passively but not actively secondary to pain. Popliteal space is benign and calf is  soft. Skin around knee reveals no erythema or warmth, no lesions or rash.   Impression & Recommendations:  Problem # 1:  KNEE PAIN, BILATERAL (ICD-719.46) Assessment Deteriorated She is on pain contract with IM (Dr Phillips Odor) so I will not alter her pain meds--she seems to have good coverage. She has had steroid injections before--initially they helped but last couple were not as useful. We discussed at length and her options are limited, so we ultimately decided to try a steroid injection for some relief. rtc prn  Problem # 2:  FOOT PAIN, RIGHT (ICD-729.5) I think this is largely secondary to increased edema on this side although it is only small difference from left. We did compression wrap wityh ace bandage. i recommend support stockings--she has tried these in past--I gave her different source to try a sligtly less aggressive stocking (www.elastictherapy.com) which are less compressive but more wearable. We discussed LE elevation at length. I had given her some muscle relaxers when I spoke w her via phone last week--these were not helpful  for foot pain and I would discontinue.  Problem # 3:  ACHILLES TENDINITIS (ICD-726.71) Assessment: Improved this has totally resolved.  Problem # 4:  ROTATOR CUFF SYNDROME, LEFT (ICD-726.10) Assessment: Improved greatly improved  Complete Medication List: 1)  Toprol Xl 200 Mg Tb24 (Metoprolol succinate) .... Take 1 tablet by mouth once a day 2)  Lotensin 40 Mg Tabs (Benazepril hcl) .Marland Kitchen.. 1 by mouth  daily 3)  Glucophage 1000 Mg Tabs (Metformin hcl) .Marland Kitchen.. 1 by mouth two times a day 4)  Calcium 500 Mg Tabs (Calcium) 5)  Glucotrol 5 Mg Tabs (Glipizide) .... Take 1 tablet by mouth two times a day 6)  Hydrochlorothiazide 25 Mg Tabs (Hydrochlorothiazide) .Marland Kitchen.. 1 by mouth daily 7)  Alprazolam 1 Mg Tb24 (Alprazolam) .... Take 1 tablet by mouth three times a day as needed for anxiety 8)  Lantus 100 Unit/ml Soln (Insulin glargine) .... Inject 34 units at  bedtime 9)  Bd Ultra-fine Pen Needles 29g X 12.71mm Misc (Insulin pen needle) 10)  Protonix 40 Mg Tbec (Pantoprazole sodium) .... Take 1 tablet by mouth once a day 11)  Voltaren 1 % Gel (Diclofenac sodium) .... Apply 4-8 grams to painful joints up to two times a day as needed and as directed. 12)  Atrovent 0.06 % Soln (Ipratropium bromide) .... 2 puffs in each nostril up to three times a day as needed for nasal congestion. 13)  Fluconazole 150 Mg Tabs (Fluconazole) .... Take 1 tablet by mouth once and may repeat in 1 week if itching/dyscharge persists 14)  Promethazine Hcl 25 Mg Tabs (Promethazine hcl) .... Take 1 tablet by mouth every 6 hours 15)  Vitamin D3 1000 Unit Caps (Cholecalciferol) .... Take 1 tablet by mouth once a day 16)  Oxycodone-acetaminophen 10-325 Mg Tabs (Oxycodone-acetaminophen) .... Take 1 tablet by mouth two times a day 17)  Astelin 137 Mcg/spray Soln (Azelastine hcl) .... One spray in each nostril two times a day as directed 18)  Gabapentin 100 Mg Caps (Gabapentin) .... 2 tablets at night for 2-3 days then two times a day then three times a day then 4 tablets at night 19)  Meloxicam 15 Mg Tabs (Meloxicam) .... One by mouth daily - per dr draper 20)  Fracture Boot Medium  .... Dx: achill tendonitis use as directed. dispense 1 21)  Tb Syringe 1 Ml Misc (Needles & syringes) .... Use as directed 22)  Lantus Solostar 100 Unit/ml Soln (Insulin glargine) .... Inject 34 units subcutaneously at bedtime or as directed by physician

## 2010-05-11 NOTE — Assessment & Plan Note (Signed)
Summary: f/u [mkj]   Diabetes Management History:      The patient is a 50 years old female who comes in for evaluation of Type 2 insulin treated.  She is (or has been) enrolled in the "Diabetic Education Program".  She states understanding of dietary principles but she is not following the appropriate diet.  No sensory loss is reported.  Self foot exams are being performed.  She is checking home blood sugars.  She says that she is not exercising regularly.        Hypoglycemic symptoms are not occurring.  No hyperglycemic symptoms are reported.        There are no symptoms to suggest diabetic complications.  No changes have been made to her treatment plan since last visit.     Vital Signs:  Patient Profile:   50 Years Old Female Height:     69 inches (175.26 cm) Weight:      377.7 pounds Temp:     97.9 degrees F oral BP sitting:   130 / 80  (right arm)  Pt. in pain?   yes    Location:   leg    Intensity:   7    Type:       aching  Vitals Entered By: Filomena Jungling (March 13, 2007 10:37 AM)              Is Patient Diabetic? Yes  CBG Result 199  Have you ever been in a relationship where you felt threatened, hurt or afraid?No   Does patient need assistance? Functional Status Self care Ambulation Normal     Diabetes Management History:      The patient is a 50 years old female who comes in for evaluation of Type 2 insulin treated.  She is (or has been) enrolled in the "Diabetic Education Program".  She states understanding of dietary principles but she is not following the appropriate diet.  No sensory loss is reported.  Self foot exams are being performed.  She is checking home blood sugars.  She says that she is not exercising regularly.        Hypoglycemic symptoms are not occurring.  No hyperglycemic symptoms are reported.        There are no symptoms to suggest diabetic complications.  No changes have been made to her treatment plan since last visit.     Current  Allergies: ! DARVOCET    Risk Factors: Tobacco use:  never Alcohol use:  no Exercise:  no Seatbelt use:  100 %  Mammogram History:    Date of Last Mammogram:  01/04/2006  PAP Smear History:    Date of Last PAP Smear:  01/04/2006   Review of Systems      See HPI   Physical Exam  General:     alert, well-developed, and overweight-appearing.   Head:     normocephalic.   Eyes:     vision grossly intact.   Ears:     R ear normal and L ear normal.   Nose:     no external deformity.   Mouth:     good dentition.   Neck:     supple.   Lungs:     normal respiratory effort and normal breath sounds.   Heart:     normal rate, regular rhythm, and no murmur.   Abdomen:     soft and non-tender.   Msk:     decreased ROM, bilateral knees and low  back Extremities:     no edema    Impression & Recommendations:  Problem # 1:  DIABETES MELLITUS, TYPE II (ICD-250.00) Doing very well. A1C continues to trend downward. BP is at goal. I encouraged weight loss and continued dietary interventions today. Need to schedule an eye exam at next appointment.  Her updated medication list for this problem includes:    Lotensin 40 Mg Tabs (Benazepril hcl) .Marland Kitchen... 1 by mouth daily    Glucophage 1000 Mg Tabs (Metformin hcl) .Marland Kitchen... 1 by mouth two times a day    Glucotrol 5 Mg Tabs (Glipizide) .Marland Kitchen... Take 1 tablet by mouth two times a day    Lantus Solostar 100 Unit/ml Soln (Insulin glargine) .Marland KitchenMarland KitchenMarland KitchenMarland Kitchen 34 units at bedtime daily  Labs Reviewed: HgBA1c: 7.5 (12/28/2006)   Creat: 0.79 (10/20/2006)     Labs Reviewed: HgBA1c: 7.1 (03/13/2007)   Creat: 0.79 (10/20/2006)      Problem # 2:  KNEE PAIN, BILATERAL (ICD-719.46) Will try to use topical pain relievers today including a trial of Lidoderm and Voltaren gel for her arthritis pain. I hesitate to increase her current as needed pain medications- she seems to have good control with minimal opiate use but I would like to see if I can decrease her need  for that medication as to not create any need for escalating doses and long term use. Will follow at next routine visit. Advised if she continues to have pain we can discuss joint injection. Note - she is currently uininsured so she has been unable to receive ortho intervention.  Her updated medication list for this problem includes:    Vicodin Es 7.5-750 Mg Tabs (Hydrocodone-acetaminophen) .Marland Kitchen... Take 1 tablet by mouth at bedtime as needed for pain  Orders: T-Sed Rate (Automated) [phone]) T-Rheumatoid Factor [phone])   Problem # 3:  HYPERTENSION (ICD-401.9) Well controlled. No changes today. Her updated medication list for this problem includes:    Toprol Xl 200 Mg Tb24 (Metoprolol succinate) .Marland Kitchen... 1 by mouth daily    Lotensin 40 Mg Tabs (Benazepril hcl) .Marland Kitchen... 1 by mouth daily    Hydrochlorothiazide 25 Mg Tabs (Hydrochlorothiazide) .Marland Kitchen... 1 by mouth daily   Problem # 4:  ANXIETY STATE NOS (ICD-300.00) Patient has anxiety induced insomnia. She has had the best response from low dose bzd taken only at night.  Will continue with short acting since this is being used predominatly for sleep now.  Her updated medication list for this problem includes:    Alprazolam 1 Mg Tb24 (Alprazolam) .Marland Kitchen... 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  Complete Medication List: 1)  Toprol Xl 200 Mg Tb24 (Metoprolol succinate) .Marland Kitchen.. 1 by mouth daily 2)  Lotensin 40 Mg Tabs (Benazepril hcl) .Marland Kitchen.. 1 by mouth daily 3)  Glucophage 1000 Mg Tabs (Metformin hcl) .Marland Kitchen.. 1 by mouth two times a day 4)  Vicodin Es 7.5-750 Mg Tabs (Hydrocodone-acetaminophen) .... Take 1 tablet by mouth at bedtime as needed for pain 5)  Calcium 500 Mg Tabs (Calcium) 6)  Glucotrol 5 Mg Tabs (Glipizide) .... Take 1 tablet by mouth two  times a day 7)  Hydrochlorothiazide 25 Mg Tabs (Hydrochlorothiazide) .Marland Kitchen.. 1 by mouth daily 8)  Alprazolam 1 Mg Tb24 (Alprazolam) .... Take 1 tablet by mouth once a day at bedtime 9)  Tussionex Pennkinetic  Er 8-10 Mg/34ml Lqcr (Chlorpheniramine-hydrocodone) .... Take 5ml by mouth q 12 hours as needed for cough. 10)  Lantus Solostar 100 Unit/ml Soln (Insulin glargine) .... 34 units at bedtime daily 11)  Bd Ultra-fine Pen Needles 29g X 12.27mm Misc (Insulin pen needle) 12)  Nasonex 50 Mcg/act Susp (Mometasone furoate) .... Use 2 sprays in each nostril daily 13)  Protonix 40 Mg Tbec (Pantoprazole sodium) .... Take 1 tablet by mouth once a day 14)  Lidoderm 5 % Ptch (Lidocaine) .... Apply one-two patches to knee or low back for 12 hours then off for 12 hours. 15)  Voltaren 1 % Gel (Diclofenac sodium) .... Apply 4-8 grams to painful joints up to two times a day as needed and as directed.  Diabetes Management Assessment/Plan:      The following lipid goals have been established for the patient: Total cholesterol goal of 200; LDL cholesterol goal of 100; HDL cholesterol goal of 40; Triglyceride goal of 200.  Her blood pressure goal is < 130/80.     Patient Instructions: 1)  Please schedule a follow-up appointment in 3 months.    Prescriptions: GLUCOTROL 5 MG TABS (GLIPIZIDE) Take 1 tablet by mouth two times a day  #60 x 11   Entered and Authorized by:   Julaine Fusi  DO   Signed by:   Julaine Fusi  DO on 03/13/2007   Method used:   Print then Give to Patient   RxID:   [id number] VOLTAREN 1 %  GEL (DICLOFENAC SODIUM) Apply 4-8 grams to painful joints up to two times a day as needed and as directed.  #1 month x 3   Entered and Authorized by:   Julaine Fusi  DO   Signed by:   Julaine Fusi  DO on 03/13/2007   Method used:   Print then Give to Patient   RxID:   [id number] LIDODERM 5 %  PTCH (LIDOCAINE) Apply one-two patches to knee or low back for 12 hours then off for 12 hours.  #30 x  6   Entered and Authorized by:   Julaine Fusi  DO   Signed by:   Julaine Fusi  DO on 03/13/2007   Method used:   Print then Give to Patient   RxID:   [id number] VICODIN ES 7.5-750 MG TABS (HYDROCODONE-ACETAMINOPHEN) Take 1 tablet by mouth at bedtime as needed for pain  #60 x 3   Entered and Authorized by:   Julaine Fusi  DO   Signed by:   Julaine Fusi  DO on 03/13/2007   Method used:   Print then Give to Patient   RxID:   [id number] PROTONIX 40 MG  TBEC (PANTOPRAZOLE SODIUM) Take 1 tablet by mouth once a day  #30 x 11   Entered and Authorized by:   Julaine Fusi  DO   Signed by:   Julaine Fusi  DO on 03/13/2007   Method used:   Print then Give to Patient   RxID:   [id number]  ]  Vital Signs:  Patient Profile:   49 Years Old Female Height:     69 inches (175.26 cm) Weight:      377.7 pounds Temp:     97.9 degrees F oral BP sitting:   130 / 80    Location:   leg    Intensity:   7    Type:       aching             CBG Result 199  Last PAP Result Normal PapHx  Normal (01/04/2006 10:01:37 AM)      Laboratory Results   Blood Tests   Date/Time Recieved: March 13, 2007 10:57 AM  Date/Time Reported: ..................................................................Marland KitchenOren Beckmann  March 13, 2007 10:57 AM   HGBA1C: 7.1%   (Normal Range: Non-Diabetic - 3-6%   Control Diabetic - 6-8%) CBG Random: 199

## 2010-05-11 NOTE — Assessment & Plan Note (Signed)
Summary: NUMBNESS IN LEFT HAND AND L KNEE PAIN   Vital Signs:  Patient profile:   50 year old female BP sitting:   140 / 70  Vitals Entered By: Lillia Pauls CMA (January 26, 2009 3:25 PM)  Primary Care Provider:  Julaine Fusi  DO   History of Present Illness: CC: left knee, hand  1. left knee - h/o OA of knee.  Worsening knee pain over last few months.  States had knee injection about 2-3 wks ago.  Has had 2 recent falls 2/2 left knee buckling and giving way, latest at home.  No locking.  Out of pain meds for 2 wks, on Percocet 10 (takes 2 at a time so ran out early), xanax and soma.    h/o left meniscal tear 3 yrs ago s/p surgery.  Has been told needs total knee replacement but no one will operate.  Currently going to [facility] class for weight loss.  2. left hand - has noted numbness over last weeks left lateral forearm and thumb.  Worse at night, also worse after placing lots of pressure on hand.    Allergies: 1)  ! Darvocet  Physical Exam  General:  overweight-appearing.  pleasant.  tearful, consolable. Msk:  Left knee - swollen, tender anteriorly and medial joint line.  + mcmurrays test.  No laxity with ACL/PCL testing.  + effusion.   Right knee - swollen, nontender  Left hand - no pain upon palpation.  negative tinel's, positive phalen's Additional Exam:  Consent obtained and verified. Sterile betadine prep. Furthur cleansed with alcohol. Topical analgesic spray: Ethyl chloride. Joint: L knee Approached in typical fashion with:  anteromedial approach Completed without difficulty Meds: 1cc kenalog 40, 4cc lidocaine Needle: 23g 1 1/2 inch Aftercare instructions and Red flags advised.     Impression & Recommendations:  Problem # 1:  KNEE PAIN, BILATERAL (ICD-719.46) today L>R.  Right knee pain completely resolved after steroid injection.  Left knee continues with pain.  Likely from bad DJD.  Offered repeat steroid injection today, advised to use walker for further  stability when ambulating.  Her updated medication list for this problem includes:    Oxycodone-acetaminophen 10-325 Mg Tabs (Oxycodone-acetaminophen) .Marland Kitchen... Take 1 tablet by mouth three times a day    Carisoprodol 350 Mg Tabs (Carisoprodol) ..... One by mouth three times a day  Problem # 2:  CARPAL TUNNEL SYNDROME, LEFT (ICD-354.0) left to discuss at future visit.  patient wanted to concentrate on left knee today.  Complete Medication List: 1)  Toprol Xl 200 Mg Tb24 (Metoprolol succinate) .... Take 1 tablet by mouth once a day 2)  Lotensin 40 Mg Tabs (Benazepril hcl) .Marland Kitchen.. 1 by mouth daily 3)  Glucophage 1000 Mg Tabs (Metformin hcl) .Marland Kitchen.. 1 by mouth two times a day 4)  Glucotrol 5 Mg Tabs (Glipizide) .... Take 1 tablet by mouth two times a day 5)  Lantus 100 Unit/ml Soln (Insulin glargine) .... Inject 34 units at bedtime 6)  Protonix 40 Mg Tbec (Pantoprazole sodium) .... Take 1 tablet by mouth once a day 7)  Vitamin D3 1000 Unit Caps (Cholecalciferol) .... Take 1 tablet by mouth once a day 8)  Gabapentin 100 Mg Caps (Gabapentin) .... 2 tablets at night for 2-3 days then two times a day then three times a day then 4 tablets at night 9)  Tb Syringe 1 Ml Misc (Needles & syringes) .... Use as directed 10)  Lasix 20 Mg Tabs (Furosemide) .... Take 1 tablet by  mouth once a day 11)  Oxycodone-acetaminophen 10-325 Mg Tabs (Oxycodone-acetaminophen) .... Take 1 tablet by mouth three times a day 12)  Alprazolam 2 Mg Tabs (Alprazolam) .... Take 1 tablet by mouth two times a day 13)  Klor-con 20 Meq Pack (Potassium chloride) .... Take 1 tablet by mouth once a day 14)  Fluconazole 150 Mg Tabs (Fluconazole) .... Take 1 tablet. 15)  Carisoprodol 350 Mg Tabs (Carisoprodol) .... One by mouth three times a day 16)  Walker  .... Use as directed  dx: gait instability, fall Prescriptions: WALKER use as directed  dx: gait instability, fall  #1 x 0   Entered by:   Lillia Pauls CMA   Authorized by:   Eustaquio Boyden  MD   Signed by:   Lillia Pauls CMA on 01/26/2009   Method used:   Print then Give to Patient   RxID:   [id number]

## 2010-05-11 NOTE — Progress Notes (Signed)
Summary: med refill/gp  Phone Note Refill Request Message from:  Fax from Pharmacy on February 25, 2009 11:12 AM  Pt. also call  requesting  refill on Alprazolam 2mg  take one tablet by mouth twice daily    Method Requested: Electronic Initial call taken by: Chinita Pester RN,  February 25, 2009 11:16 AM  Follow-up for Phone Call        this is not on her medication list Follow-up by: Zoila Shutter MD,  February 25, 2009 11:27 AM  Additional Follow-up for Phone Call Additional follow up Details #1::        Med. was d/c 10/26(visit).   Pt. and pharmacy made awared. Additional Follow-up by: Chinita Pester RN,  February 25, 2009 12:25 PM

## 2010-05-11 NOTE — Progress Notes (Signed)
Summary: requesting benzos  Phone Note Call from Patient   Summary of Call: Pt calls regarding her anxiety and panic attacks.  She stated that her xanax was changed to lorazepam earlier this month in clinic and started on another medication as well for her anxiety.  She went into a lot of detail about her son being killed in the [employer] and her daughter being incarcerated.  She reports the lorazepam has not helped her at all and that she has been taking 1 1/2 tablets and is now out.  She was asking if I could refill this for her.  I reviewed her office visit and recent phone notes where she has called asking for refills.  I informed her that it is clinic policy to not provide refills on controlled substances after hours.  She went to say that this has been done in the recent past (recent phone notes have not approved refills).  Regardless, I reiterated that it is policy not to do so and that if her symptoms were severe enough, she should go to the [facility] or ED.  She is agreeable.  Initial call taken by: Mariea Stable MD,  July 05, 2009 8:32 PM     Appended Document: requesting benzos Patient should not recieve controlled meds from our clinic.

## 2010-05-11 NOTE — Progress Notes (Signed)
Summary: refill/gh  Phone Note Refill Request Message from:  Fax from Pharmacy on May 09, 2006 4:00 PM  Refills Requested: Medication #1:  ATIVAN 1 MG TABS 1po three times a day prn   Dosage confirmed as above?Dosage Confirmed   Supply Requested: 1 month   Last Refilled: 04/10/2006 Initial call taken by: Angelina Ok RN,  May 09, 2006 4:01 PM  Follow-up for Phone Call        Refill approved-nurse to complete; ONLY A 30 DAYS SUPPLY AS SHE DNKA'D HER JANUARY APPT. Follow-up by: Duncan Dull MD,  May 09, 2006 4:36 PM  Additional Follow-up for Phone Call Additional follow up Details #1::        Rx faxed to pharmacy    Prescriptions: ATIVAN 1 MG TABS (LORAZEPAM) 1po three times a day prn  #93 x 0   Entered and Authorized by:   Duncan Dull MD   Signed by:   Duncan Dull MD on 05/09/2006   Method used:   Telephoned to ...         RxID:   [id number]

## 2010-05-11 NOTE — Progress Notes (Signed)
Summary: bee sting/ hla  Phone Note Call from Patient   Summary of Call: pt calls and c/o bee sting to elbow sat, it has continued to swell and now cannot bend arm at elbow, it is warm to touch and tender, she is concerned that it seems to be becoming worse, it is suggested that she go to urg care today, states she will.                    Initial call taken by: Marin Roberts RN,  January 05, 2010 12:00 PM  Follow-up for Phone Call        noted. Follow-up by: Julaine Fusi  DO,  January 12, 2010 11:16 AM

## 2010-05-11 NOTE — Progress Notes (Signed)
Summary: phone/gg  Phone Note Call from Patient   Caller: Patient Summary of Call: pt called and states [facility] a boil on vaginal area.  she was given antibiotic and told it was not ready to be lanced.  That night the boil drained.  Area is still swollen about the size of an egg and she feels it needs to be opened. Pt #[phone]  will see tomorrow PM Initial call taken by: Merrie Roof RN,  December 21, 2009 11:52 AM  Follow-up for Phone Call        Can she be [facility] back at [facility]? Follow-up by: Margarito Liner MD,  December 21, 2009 2:45 PM  Additional Follow-up for Phone Call Additional follow up Details #1::        Pt informed and will [facility].  If she has any problems she will call in the AM.  I will hold the slot for tomorrow. Additional Follow-up by: Merrie Roof RN,  December 21, 2009 2:47 PM

## 2010-05-11 NOTE — Progress Notes (Signed)
Summary: Refill/gh  Phone Note Refill Request Message from:  Pharmacy on July 10, 2007 1:59 PM  Refills Requested: Medication #1:  ALPRAZOLAM 1 MG  TB24 Take 1 tablet by mouth once a day at bedtime   Last Refilled: 06/11/2007  Medication #2:  HYDROCHLOROTHIAZIDE 25 MG TABS 1 by mouth daily   Last Refilled: 04/09/2007 Initial call taken by: Angelina Ok RN,  July 10, 2007 2:00 PM  Follow-up for Phone Call        Approved- Venita Sheffield has called in for patient. Follow-up by: Julaine Fusi  DO,  July 12, 2007 2:52 PM      Prescriptions: VICODIN ES 7.5-750 MG TABS (HYDROCODONE-ACETAMINOPHEN) Take 1 tablet by mouth two times a day  #60 x 3   Entered and Authorized by:   Julaine Fusi  DO   Signed by:   Julaine Fusi  DO on 07/12/2007   Method used:   Telephoned to ...       [facility]  [address] [phone]*       [address]       [place], [place]  [address]       Ph: [phone] or [phone]       Fax: [phone]   RxID:   [id number] ALPRAZOLAM 1 MG  TB24 (ALPRAZOLAM) Take 1 tablet by mouth once a day at bedtime  #30 x 3   Entered and Authorized by:   Julaine Fusi  DO   Signed by:   Julaine Fusi  DO on 07/12/2007   Method used:   Telephoned to ...       [facility]  [address] [id number]*       [address]       [place], [place]  [address]       Ph: [phone] or [phone]       Fax: [phone]   RxID:   [id number]

## 2010-05-11 NOTE — Progress Notes (Signed)
  Pt. called me saying that the pt. has achilles tendinitis and her toilet is very low and because of her tendinitis it hurts to go to the toilet. She wants me to write a presciption for a "higher toilet". I asked the pt. to come to [facility] and see Dr. Phillips Odor, and if she is unavailabel to see other doctor and get checked up first and get the prescription if needed.   Appended Document:  ok to approve higher toilet. Can place equipment request.  Appended Document: Orders Update    Clinical Lists Changes  Medications: Added new medication of RAISED TOILET SEAT  MISC (MISC. DEVICES) Dx: Severe bilateral knee Osteoarthritis and morbid obesity - Signed Added new medication of RAISED TOILET SEAT  MISC (MISC. DEVICES) Dx: Bilateral knee Osteoarthritis, Morbid Obesity - Signed Rx of RAISED TOILET SEAT  MISC (MISC. DEVICES) Dx: Severe bilateral knee Osteoarthritis and morbid obesity;  #1 x 0;  Signed;  Entered by: Julaine Fusi  DO;  Authorized by: Julaine Fusi  DO;  Method used: Historical Rx of RAISED TOILET SEAT  MISC (MISC. DEVICES) Dx: Bilateral knee Osteoarthritis, Morbid Obesity;  #1 x 0;  Signed;  Entered by: Julaine Fusi  DO;  Authorized by: Julaine Fusi  DO;  Method used: Print then Give to Patient    Prescriptions: RAISED TOILET SEAT  MISC (MISC. DEVICES) Dx: Bilateral knee Osteoarthritis, Morbid Obesity  #1 x 0   Entered and Authorized by:   Julaine Fusi  DO   Signed by:   Julaine Fusi  DO on 06/23/2008   Method used:   Print then Give to Patient   RxID:   [id number] RAISED TOILET SEAT  MISC (MISC. DEVICES) Dx: Severe bilateral knee Osteoarthritis and morbid obesity  #1 x 0   Entered and Authorized by:   Julaine Fusi  DO   Signed by:   Julaine Fusi  DO on 06/23/2008   Method used:   Historical   RxID:   [id number]

## 2010-05-11 NOTE — Assessment & Plan Note (Signed)
Summary: F/U/PER DR GOLDING/VS   Vital Signs:  Patient profile:   50 year old female Height:      69 inches (175.26 cm) Weight:      370.6 pounds (168.45 kg) BMI:     54.93 Pulse rate:   76 / minute BP sitting:   128 / 83  (left arm) Cuff size:   wrist  Vitals Entered By: Krystal Eaton Duncan Dull) (October 22, 2008 11:00 AM) CC: wants to know if we can handle pain med refills, vaginal exam (?yeast infection) wants urine checked Is Patient Diabetic? Yes  Nutritional Status BMI of > 30 = obese  Does patient need assistance? Functional Status Self care Ambulation Wheelchair Comments arrived via wheelchair and cane   CC:  wants to know if we can handle pain med refills and vaginal exam (?yeast infection) wants urine checked.  History of Present Illness: Maria [name] is in for wet prep and STD screening in the setting of recurrent vaginal infections.  Allergies: 1)  ! Darvocet  Physical Exam  General:  overweight-appearing.  overweight-appearing.   Genitalia:  Normal introitus for age, no external lesions, physiologic vaginal discharge, mucosa pink and moist, no vaginal or cervical lesions, no vaginal atrophy, no friaility or hemorrhage, normal uterus size and position, no adnexal masses or tenderness Psych:  normally interactive, good eye contact, and not anxious appearing.     Impression & Recommendations:  Problem # 1:  KNEE PAIN, BILATERAL (ICD-719.46)  I had her sign a pain contract today. Apparently she was being seen by [facility] but they were not helping her. She was in a contrcat with them but has broken it so that she can get her meds in teh ED when she has kidney stones and here in the clinic. I was unaware that she was on a pain contract anywhere else. We had a long discussion today about continued opiates for pain management. 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 Multipe  orthopedists have refused to operate or do knoee replacement dur to her weight and age. She is working on [employer] and possible gastric bypass surgery. I will refill her percocet today. I need to see her back to discuss long acting pain medication. I also need to get the records from teh [facility] to know what medication they had her on.  Her updated medication list for this problem includes:    Oxycodone-acetaminophen 10-325 Mg Tabs (Oxycodone-acetaminophen) .Marland Kitchen... Take 1 tablet by mouth two times a day    Meloxicam 15 Mg Tabs (Meloxicam) ..... One by mouth daily - per dr draper  Problem # 2:  VAGINAL DISCHARGE (ICD-623.5) Exam today.  Orders: T- GC Chlamydia ([address]109) T-Urinalysis ([id number]) T-Culture, Urine ([id number]) T-Wet Prep by Molecular [facility] [phone])  Problem # 3:  MAMMOGRAM, ABNORMAL, HX OF (ICD-V15.9) recent study done per patient.  Problem # 4:  HYPERTENSION (ICD-401.9)  Her updated medication list for this problem includes:    Toprol Xl 200 Mg Tb24 (Metoprolol succinate) .Marland Kitchen... Take 1 tablet by mouth once a day    Lotensin 40 Mg Tabs (Benazepril hcl) .Marland Kitchen... 1 by mouth daily    Hydrochlorothiazide 25 Mg Tabs (Hydrochlorothiazide) .Marland Kitchen... 1 by mouth daily  BP today: 128/83 Prior BP: 126/81 (09/24/2008)  Labs Reviewed: K+: 4.5 (08/26/2008) Creat: : 1.02 (08/26/2008)   Chol: 159 (08/26/2008)   HDL: 67 (08/26/2008)   LDL: 78 (08/26/2008)   TG: 70 (08/26/2008)  Problem #  5:  DIABETES MELLITUS, TYPE II (ICD-250.00) Reports taking her medications. Wa able to get her isulin.   Her updated medication list for this problem includes:    Lotensin 40 Mg Tabs (Benazepril hcl) .Marland Kitchen... 1 by mouth daily    Glucophage 1000 Mg Tabs (Metformin hcl) .Marland Kitchen... 1 by mouth two times a day    Glucotrol 5 Mg Tabs (Glipizide) .Marland Kitchen... Take 1 tablet by mouth two times a day    Lantus 100 Unit/ml Soln (Insulin glargine) ..... Inject 34 units at bedtime    Lantus Solostar 100 Unit/ml Soln  (Insulin glargine) ..... Inject 34 units subcutaneously at bedtime or as directed by physician  Labs Reviewed: Creat: 1.02 (08/26/2008)    Reviewed HgBA1c results: 8.1 (09/24/2008)  7.1 (02/25/2008)  Problem # 6:  MORBID OBESITY (ICD-278.01) exploring options for gastric bypass surgery  Complete Medication List: 1)  Toprol Xl 200 Mg Tb24 (Metoprolol succinate) .... Take 1 tablet by mouth once a day 2)  Lotensin 40 Mg Tabs (Benazepril hcl) .Marland Kitchen.. 1 by mouth daily 3)  Glucophage 1000 Mg Tabs (Metformin hcl) .Marland Kitchen.. 1 by mouth two times a day 4)  Calcium 500 Mg Tabs (Calcium) 5)  Glucotrol 5 Mg Tabs (Glipizide) .... Take 1 tablet by mouth two times a day 6)  Hydrochlorothiazide 25 Mg Tabs (Hydrochlorothiazide) .Marland Kitchen.. 1 by mouth daily 7)  Alprazolam 1 Mg Tb24 (Alprazolam) .... Take 1 tablet by mouth three times a day as needed for anxiety 8)  Lantus 100 Unit/ml Soln (Insulin glargine) .... Inject 34 units at bedtime 9)  Bd Ultra-fine Pen Needles 29g X 12.67mm Misc (Insulin pen needle) 10)  Protonix 40 Mg Tbec (Pantoprazole sodium) .... Take 1 tablet by mouth once a day 11)  Voltaren 1 % Gel (Diclofenac sodium) .... Apply 4-8 grams to painful joints up to two times a day as needed and as directed. 12)  Atrovent 0.06 % Soln (Ipratropium bromide) .... 2 puffs in each nostril up to three times a day as needed for nasal congestion. 13)  Fluconazole 150 Mg Tabs (Fluconazole) .... Take 1 tablet by mouth once and may repeat in 1 week if itching/dyscharge persists 14)  Promethazine Hcl 25 Mg Tabs (Promethazine hcl) .... Take 1 tablet by mouth every 6 hours 15)  Vitamin D3 1000 Unit Caps (Cholecalciferol) .... Take 1 tablet by mouth once a day 16)  Oxycodone-acetaminophen 10-325 Mg Tabs (Oxycodone-acetaminophen) .... Take 1 tablet by mouth two times a day 17)  Astelin 137 Mcg/spray Soln (Azelastine hcl) .... One spray in each nostril two times a day as directed 18)  Gabapentin 100 Mg Caps (Gabapentin) .... 2  tablets at night for 2-3 days then two times a day then three times a day then 4 tablets at night 19)  Meloxicam 15 Mg Tabs (Meloxicam) .... One by mouth daily - per dr draper 20)  Fracture Boot Medium  .... Dx: achill tendonitis use as directed. dispense 1 21)  Tb Syringe 1 Ml Misc (Needles & syringes) .... Use as directed 22)  Lantus Solostar 100 Unit/ml Soln (Insulin glargine) .... Inject 34 units subcutaneously at bedtime or as directed by physician  Other Orders: T-Drug Screen-Urine, (single) [phone])  Patient Instructions: 1)  Please schedule a follow-up appointment in 1 months. Prescriptions: OXYCODONE-ACETAMINOPHEN 10-325 MG TABS (OXYCODONE-ACETAMINOPHEN) Take 1 tablet by mouth two times a day  #60 x 0   Entered and Authorized by:   Julaine Fusi  DO   Signed by:   Julaine Fusi  DO on 10/22/2008   Method used:   Print then Give to Patient   RxID:   [id number] ALPRAZOLAM 1 MG  TB24 (ALPRAZOLAM) Take 1 tablet by mouth three times a day as needed for anxiety  #90 x 1   Entered and Authorized by:   Julaine Fusi  DO   Signed by:   Julaine Fusi  DO on 10/22/2008   Method used:   Print then Give to Patient   RxID:   [id number]

## 2010-05-11 NOTE — Progress Notes (Signed)
Summary: phone/gg  Phone Note Call from Patient   Caller: Patient Summary of Call: Pt called stating she finished METRONIDAZOLE 500 MG TABS (METRONIDAZOLE) Take 1 tablet by mouth two times a day  #14 three  weeks ago and still has small amt d/c .  It never completely cleared up  She is asking for more or another med that would help. Also she is on klonipin and cymbalta from [facility], just wanted you to know. Initial call taken by: Merrie Roof RN,  September 24, 2009 5:05 PM  Follow-up for Phone Call        I think she had been referred to GYN. At this point I am not sure what to write her for...she would need to come in for an exam, STD scereening- I dont see where that has been done recently. If teh metro worked well and now she has BV again I am ok refilling but if symtoms have persisted then we need to do an additoonal w/u. Follow-up by: Julaine Fusi  DO,  September 25, 2009 12:28 AM  Additional Follow-up for Phone Call Additional follow up Details #1::        Pt not at home.  Message left with faily for her to call me back. Merrie Roof RN  September 25, 2009 1:47 PM   appointment scheduled Additional Follow-up by: Merrie Roof RN,  September 25, 2009 5:39 PM

## 2010-05-11 NOTE — Progress Notes (Signed)
Summary: refill/gg  Phone Note Refill Request  on December 08, 2006 3:58 PM  Refills Requested: Medication #1:  HYDROCHLOROTHIAZIDE 25 MG TABS 1 by mouth daily [facility]   **  # 100  Initial call taken by: Merrie Roof RN,  December 08, 2006 3:58 PM  Follow-up for Phone Call        Refill approved-nurse to complete. Follow-up by: Margarito Liner MD,  December 08, 2006 6:35 PM  Additional Follow-up for Phone Call Additional follow up Details #1::        Rx faxed to pharmacy Additional Follow-up by: Merrie Roof RN,  December 12, 2006 9:27 AM      Prescriptions: HYDROCHLOROTHIAZIDE 25 MG TABS (HYDROCHLOROTHIAZIDE) 1 by mouth daily  #100 x 0   Entered and Authorized by:   Margarito Liner MD   Signed by:   Margarito Liner MD on 12/08/2006   Method used:   Telephoned to ...       [facility]       [address] [place], [place]  [address]  [place]       Ph: [phone]       Fax: [phone]   RxID:   [id number]

## 2010-05-11 NOTE — Assessment & Plan Note (Signed)
Summary: re-eval for kidney stone/gg   Vital Signs:  Patient profile:   50 year old female Height:      67.5 inches Weight:      371.1 pounds (168.68 kg) Temp:     97.0 degrees F (36.11 degrees C) oral Pulse rate:   96 / minute BP sitting:   154 / 89  (right arm)  Vitals Entered By: Krystal Eaton [facility]) (April 23, 2009 1:34 PM) CC: pt c/o right side flank/lower abd pain-was seen in ED yesterday Is Patient Diabetic? Yes Did you bring your meter with you today? No Pain Assessment Patient in pain? yes     Location: right side flank/lower abd pain Intensity: 8 Type: sharp Nutritional Status BMI of > 30 = obese CBG Result 152  Have you ever been in a relationship where you felt threatened, hurt or afraid?No   Does patient need assistance? Functional Status Self care, Cook/clean Ambulation Impaired:Risk for fall Comments arived via wheelchair   Primary Care Provider:  Julaine Fusi  DO  CC:  pt c/o right side flank/lower abd pain-was seen in ED yesterday.  History of Present Illness: 1. Patient is here for a f/u from ED on 04/22/09 for Left uereteral calculi per CT of pelvis. Given Percocet #20. Patient reports that she passed a stone overnight. Reports some LB cramping and nausea. Denies fever, chills, pelvic pain, dysuria or hematuria.Hx of nephrolithiases x 7 episodes. Was followedby a Urology group "but was unable to see them" because of an outstanding bill. 2. Requests a refill on her xanax. Feels very anxious and depressed.Lots of socio-economic issues at home (daughter andsignificant other are imprisoned; fighting for a grand daughter's custody). Zoloft --took it for 4 weeks --no any alleviation of depression. Reports "feeling like driving over the bridge 2 weeks ago." Denies any suicidal, homocidal ideations at present.No maniac episodes.   Preventive Screening-Counseling & Management  Alcohol-Tobacco     Alcohol drinks/day: 0     Smoking Status: never  Current  Medications (verified): 1)  Toprol Xl 200 Mg Tb24 (Metoprolol Succinate) .... Take 1 Tablet By Mouth Once A Day 2)  Lotensin 40 Mg Tabs (Benazepril Hcl) .Marland Kitchen.. 1 By Mouth Daily 3)  Glucophage 1000 Mg Tabs (Metformin Hcl) .Marland Kitchen.. 1 By Mouth Two Times A Day 4)  Glucotrol 5 Mg Tabs (Glipizide) .... Take 1 Tablet By Mouth Two Times A Day 5)  Lantus 100 Unit/ml Soln (Insulin Glargine) .... Inject 45 Units At Bedtime 6)  Protonix 40 Mg  Tbec (Pantoprazole Sodium) .... Take 1 Tablet By Mouth Once A Day 7)  Vitamin D3 1000 Unit Caps (Cholecalciferol) .... Take 1 Tablet By Mouth Once A Day 8)  Gabapentin 100 Mg Caps (Gabapentin) .... Three Pills By Mouth Twice A Day. 9)  Tb Syringe 1 Ml  Misc (Needles & Syringes) .... Use As Directed 10)  Carisoprodol 350 Mg Tabs (Carisoprodol) .... One By Mouth Three Times A Day 11)  Walker .... Use As Directed  Dx: Gait Instability, Fall 12)  Lidoderm 5 % Ptch (Lidocaine) .... Apply Patch To Affected Knee For Up To 12 Hours A Day. 13)  Fluticasone Propionate 50 Mcg/act Susp (Fluticasone Propionate) .... 2 Puffs Iin Both Nares Q Day 14)  Alprazolam 2 Mg Tabs (Alprazolam) .... Take 1 Tablet By Mouth An Needed At Bedtime 15)  Hydrochlorothiazide 25 Mg Tabs (Hydrochlorothiazide) .... Take 1 Tablet By Mouth Every Morning 16)  Fluoxetine Hcl 40 Mg Caps (Fluoxetine Hcl) .... Take 1 Tablet By Mouth  Once A Day 17)  Acetaminophen 650 Mg Cr-Tabs (Acetaminophen) .... Take One Tabletby Mouth Every 4-6 Hours As Needed For Pain 18)  Promethazine Hcl 12.5 Mg Tabs (Promethazine Hcl) .... Take One Tablet By Mouth Q 4 Hrs Prn For Nausea  Allergies (verified): 1)  ! Darvocet  Past History:  Risk Factors: Alcohol Use: 0 (04/23/2009) Exercise: no (03/16/2009)  Past medical, surgical, family and social histories (including risk factors) reviewed, and no changes noted (except as noted below).  Past Medical History: Reviewed history from 02/25/2008 and no changes  required. Hypertension Diabetes mellitus, type II Hyperlipidemia Obesity h/o dysfunctional uterine bleeding allergic rhinitis s/p tubal lugation Anxiety h/o chest pain w neg cardiolite in 2005 kidney stones with extraction 2009  Family History: Reviewed history from 03/20/2007 and no changes required. 1st degree relatives with: Obesity Diabetes Hypertension CAD  Social History: Reviewed history from 03/20/2007 and no changes required. Married. No ETOH. No Tobacco. No Drugs.  Review of Systems       per HPI  Physical Exam  General:  in NA,Dwell-nourished, well-hydrated, and overweight-appearing.   Head:  no abnormalities observed.   Eyes:  pupils equal, pupils round, and pupils reactive to light.   Ears:  R ear normal and L ear normal.   Nose:  no external erythema and no nasal discharge.   Mouth:  good dentition, no gingival abnormalities, and pharynx pink and moist.   Neck:  No deformities, masses, or tenderness noted. Lungs:  normal respiratory effort and normal breath sounds.   Heart:  normal rate, regular rhythm, and no murmur.   Abdomen:  Bowel sounds positive,abdomen soft and non-tender without masses, organomegaly or hernias noted. No CVA tenderness bilaterally. Rectal:  normal sphincter tone and no masses.   Msk:  normal ROM, no joint tenderness, no joint swelling, no joint warmth, no redness over joints, and no joint deformities.   Pulses:  pedal pulses 1+ /4 bialterally. Extremities:  No clubbing, cyanosis, edema, or deformity noted with normal full range of motion of all joints.   Neurologic:  No cranial nerve deficits noted. Station and gait are normal. Plantar reflexes are down-going bilaterally. DTRs are symmetrical throughout. Sensory, motor and coordinative functions appear intact. Skin:  Intact without suspicious lesions or rashes Psych:  tearful, severely anxious, hyperactive, and agitated.    Diabetes Management Exam:    Foot Exam (with socks  and/or shoes not present):       Sensory-Pinprick/Light touch:          Left medial foot (L-4): normal          Left dorsal foot (L-5): normal          Left lateral foot (S-1): normal          Right medial foot (L-4): normal          Right dorsal foot (L-5): normal          Right lateral foot (S-1): normal       Sensory-Monofilament:          Left foot: normal          Right foot: normal       Inspection:          Left foot: normal          Right foot: normal       Nails:          Left foot: normal          Right foot: normal  Impression & Recommendations:  Problem # 1:  DIABETES MELLITUS, TYPE II (ICD-250.00)  Her updated medication list for this problem includes:    Lotensin 40 Mg Tabs (Benazepril hcl) .Marland Kitchen... 1 by mouth daily    Glucophage 1000 Mg Tabs (Metformin hcl) .Marland Kitchen... 1 by mouth two times a day    Glucotrol 5 Mg Tabs (Glipizide) .Marland Kitchen... Take 1 tablet by mouth two times a day    Lantus 100 Unit/ml Soln (Insulin glargine) ..... Inject 45 units at bedtime  Orders: T-Hgb A1C (in-house) [phone]) T- Capillary Blood Glucose (95621) Diabetic Clinic Referral (Diabetic)  Labs Reviewed: Creat: 1.02 (08/26/2008)    Reviewed HgBA1c results: 7.6 (02/03/2009)  8.1 (09/24/2008)  Her updated medication list for this problem includes:    Lotensin 40 Mg Tabs (Benazepril hcl) .Marland Kitchen... 1 by mouth daily    Glucophage 1000 Mg Tabs (Metformin hcl) .Marland Kitchen... 1 by mouth two times a day    Glucotrol 5 Mg Tabs (Glipizide) .Marland Kitchen... Take 1 tablet by mouth two times a day    Lantus 100 Unit/ml Soln (Insulin glargine) ..... Inject 45 units at bedtime  Labs Reviewed: Creat: 1.02 (08/26/2008)    Reviewed HgBA1c results: 8.2 (04/23/2009)  7.6 (02/03/2009)  Problem # 2:  HYPERTENSION (ICD-401.9)  Improved. Will change lasix for Hctz due to the patient's c/o of severe xerostomia. The following medications were removed from the medication list:    Lasix 40 Mg Tabs (Furosemide) .Marland Kitchen... Take 1 tablet by  mouth once a day Her updated medication list for this problem includes:    Toprol Xl 200 Mg Tb24 (Metoprolol succinate) .Marland Kitchen... Take 1 tablet by mouth once a day    Lotensin 40 Mg Tabs (Benazepril hcl) .Marland Kitchen... 1 by mouth daily    Hydrochlorothiazide 25 Mg Tabs (Hydrochlorothiazide) .Marland Kitchen... Take 1 tablet by mouth every morning  BP today: 154/89 Prior BP: 190/106 (03/16/2009)  Labs Reviewed: K+: 4.5 (08/26/2008) Creat: : 1.02 (08/26/2008)   Chol: 159 (08/26/2008)   HDL: 67 (08/26/2008)   LDL: 78 (08/26/2008)   TG: 70 (08/26/2008)  The following medications were removed from the medication list:    Lasix 40 Mg Tabs (Furosemide) .Marland Kitchen... Take 1 tablet by mouth once a day Her updated medication list for this problem includes:    Toprol Xl 200 Mg Tb24 (Metoprolol succinate) .Marland Kitchen... Take 1 tablet by mouth once a day    Lotensin 40 Mg Tabs (Benazepril hcl) .Marland Kitchen... 1 by mouth daily    Hydrochlorothiazide 25 Mg Tabs (Hydrochlorothiazide) .Marland Kitchen... Take 1 tablet by mouth every morning  Orders: T-Basic Metabolic Panel ([id number])  Problem # 3:  NEPHROLITHIASIS, RECURRENT (ICD-592.0)  F/U from ED. Repeat UA to asses for infection;  B-met to assess renal fxn. Tylnol for pain as needed.Will avoid opioids due to a hx of medication diversion.  Orders: Urology Referral (Urology)  Problem # 4:  MAMMOGRAM, ABNORMAL, HX OF (ICD-V15.9) Referred for a mammogram.  Problem # 5:  DEPRESSIVE DISORDER NOT ELSEWHERE CLASSIFIED (ICD-311)  Referral to a Psychiatrist ASAP. The following medications were removed from the medication list:    Zoloft 100 Mg Tabs (Sertraline hcl) .Marland Kitchen... Take 1/2 tablet every day for one week. increase to one tablet per day thereafter.    Alprazolam 2 Mg Tabs (Alprazolam) .Marland Kitchen... Take 1 tablet by mouth an needed at bedtime Her updated medication list for this problem includes:    Fluoxetine Hcl 40 Mg Caps (Fluoxetine hcl) .Marland Kitchen... Take 1 tablet by mouth once a day    Clonazepam 1  Mg Tabs  (Clonazepam) .Marland Kitchen... Take 1 tablet by mouth two times a day  Discussed treatment options, including trial of antidpressant medication. Will refer to behavioral health. Follow-up call in in 24-48 hours and recheck in 2 weeks, sooner as needed. Patient agrees to call if any worsening of symptoms or thoughts of doing harm arise. Verified that the patient has no suicidal ideation at this time.   Orders: Psychiatric Referral (Psych) T-Drug Screen-Urine, (single) [phone])  Problem # 6:  ANXIETY STATE NOS (ICD-300.00) Due to a concern for a Drug deversiuon per previous records reviewed --will change Alprazolam for klonipin. Side-effects reviewed with the patient. The following medications were removed from the medication list:    Zoloft 100 Mg Tabs (Sertraline hcl) .Marland Kitchen... Take 1/2 tablet every day for one week. increase to one tablet per day thereafter.    Alprazolam 2 Mg Tabs (Alprazolam) .Marland Kitchen... Take 1 tablet by mouth an needed at bedtime Her updated medication list for this problem includes:    Fluoxetine Hcl 40 Mg Caps (Fluoxetine hcl) .Marland Kitchen... Take 1 tablet by mouth once a day    Clonazepam 1 Mg Tabs (Clonazepam) .Marland Kitchen... Take 1 tablet by mouth two times a day  Medications Added to Medication List This Visit: 1)  Hydrochlorothiazide 25 Mg Tabs (Hydrochlorothiazide) .... Take 1 tablet by mouth every morning 2)  Fluoxetine Hcl 40 Mg Caps (Fluoxetine hcl) .... Take 1 tablet by mouth once a day 3)  Acetaminophen 650 Mg Cr-tabs (Acetaminophen) .... Take one tabletby mouth every 4-6 hours as needed for pain 4)  Promethazine Hcl 12.5 Mg Tabs (Promethazine hcl) .... Take one tablet by mouth q 4 hrs prn for nausea 5)  Clonazepam 1 Mg Tabs (Clonazepam) .... Take 1 tablet by mouth two times a day  Complete Medication List: 1)  Toprol Xl 200 Mg Tb24 (Metoprolol succinate) .... Take 1 tablet by mouth once a day 2)  Lotensin 40 Mg Tabs (Benazepril hcl) .Marland Kitchen.. 1 by mouth daily 3)  Glucophage 1000 Mg Tabs  (Metformin hcl) .Marland Kitchen.. 1 by mouth two times a day 4)  Glucotrol 5 Mg Tabs (Glipizide) .... Take 1 tablet by mouth two times a day 5)  Lantus 100 Unit/ml Soln (Insulin glargine) .... Inject 45 units at bedtime 6)  Protonix 40 Mg Tbec (Pantoprazole sodium) .... Take 1 tablet by mouth once a day 7)  Vitamin D3 1000 Unit Caps (Cholecalciferol) .... Take 1 tablet by mouth once a day 8)  Gabapentin 100 Mg Caps (Gabapentin) .... Three pills by mouth twice a day. 9)  Tb Syringe 1 Ml Misc (Needles & syringes) .... Use as directed 10)  Carisoprodol 350 Mg Tabs (Carisoprodol) .... One by mouth three times a day 11)  Walker  .... Use as directed  dx: gait instability, fall 12)  Lidoderm 5 % Ptch (Lidocaine) .... Apply patch to affected knee for up to 12 hours a day. 13)  Fluticasone Propionate 50 Mcg/act Susp (Fluticasone propionate) .... 2 puffs iin both nares q day 14)  Hydrochlorothiazide 25 Mg Tabs (Hydrochlorothiazide) .... Take 1 tablet by mouth every morning 15)  Fluoxetine Hcl 40 Mg Caps (Fluoxetine hcl) .... Take 1 tablet by mouth once a day 16)  Acetaminophen 650 Mg Cr-tabs (Acetaminophen) .... Take one tabletby mouth every 4-6 hours as needed for pain 17)  Promethazine Hcl 12.5 Mg Tabs (Promethazine hcl) .... Take one tablet by mouth q 4 hrs prn for nausea 18)  Clonazepam 1 Mg Tabs (Clonazepam) .... Take 1 tablet by  mouth two times a day  Other Orders: T-Urinalysis Dipstick only ([id number])   Patient Instructions: 1)  Pleased, take all yoiur medications as prescribed. 2)  Please, call 911 or go to ER if feeling suicidal, homicidal or maniac. 3)  Our office will contact you with a referral to see a psychiatrist; kidney specialist, mammogram and an eye exam. 4)  Please, make an appointment with Ms. Rhiley for a diabetic teaching. 5)  Please, make an appointment with either me or Dr. Phillips Odor in 4 weeks. Prescriptions: CLONAZEPAM 1 MG TABS (CLONAZEPAM) Take 1 tablet by mouth two times a day  #60 x  0   Entered and Authorized by:   Deatra Robinson MD   Signed by:   Deatra Robinson MD on 04/23/2009   Method used:   Print then Give to Patient   RxID:   [id number] PROMETHAZINE HCL 12.5 MG TABS (PROMETHAZINE HCL) Take one tablet by mouth q 4 hrs PRn for nausea  #12 x 1   Entered and Authorized by:   Deatra Robinson MD   Signed by:   Deatra Robinson MD on 04/23/2009   Method used:   Electronically to        [facility] #[id number]* (retail)       [address] [place], [place]  [address]       Ph: [phone]       Fax: [phone]   RxID:   [id number] GABAPENTIN 100 MG CAPS (GABAPENTIN) Three pills by mouth twice a day.  #180 x 3   Entered and Authorized by:   Deatra Robinson MD   Signed by:   Deatra Robinson MD on 04/23/2009   Method used:   Electronically to        [facility] #[id number]* (retail)       [address] [place], [place]  [address]       Ph: [phone]       Fax: [phone]   RxID:   [id number] ACETAMINOPHEN 650 MG CR-TABS (ACETAMINOPHEN) Take one tabletby mouth every 4-6 hours as needed for pain  #60 x 0   Entered and Authorized by:   Deatra Robinson MD   Signed by:   Deatra Robinson MD on 04/23/2009   Method used:   Electronically to        [facility] #[id number]* (retail)       [address] [place], [place]  [address]       Ph: [phone]       Fax: [phone]   RxID:   [id number] FLUOXETINE HCL 40 MG CAPS (FLUOXETINE HCL) Take 1 tablet by mouth once a day  #30 x 3   Entered and Authorized by:   Deatra Robinson MD   Signed by:   Deatra Robinson MD on 04/23/2009   Method used:   Electronically to        [facility] #[id number]* (retail)       [address] [place], [place]  [address]       Ph: [phone]       Fax: [phone]   RxID:   [id number] HYDROCHLOROTHIAZIDE 25 MG TABS (HYDROCHLOROTHIAZIDE) Take 1 tablet by mouth every morning  #30 x 3   Entered and Authorized by:    Deatra Robinson MD   Signed by:   Tillie Rung  Denton Meek MD on 04/23/2009   Method used:   Electronically to        [facility] #[id number]* (retail)       [address] [place], [place]  [address]       Ph: [phone]       Fax: [phone]   RxID:   [id number]   Laboratory Results   Urine Tests  Date/Time Received: Krystal Eaton [facility])  April 23, 2009 1:41 PM  Date/Time Reported: Krystal Eaton [facility])  April 23, 2009 1:42 PM   Routine Urinalysis   Color: lt. yellow Appearance: Clear Glucose: negative   (Normal Range: Negative) Bilirubin: negative   (Normal Range: Negative) Ketone: negative   (Normal Range: Negative) Spec. Gravity: 1.015   (Normal Range: 1.003-1.035) Blood: trace-intact   (Normal Range: Negative) pH: 5.0   (Normal Range: 5.0-8.0) Protein: negative   (Normal Range: Negative) Urobilinogen: 0.2   (Normal Range: 0-1) Nitrite: negative   (Normal Range: Negative) Leukocyte Esterace: negative   (Normal Range: Negative)     Blood Tests   Date/Time Received: April 23, 2009 2:13 PM  Date/Time Reported: Burke Keels  April 23, 2009 2:14 PM   HGBA1C: 8.2%   (Normal Range: Non-Diabetic - 3-6%   Control Diabetic - 6-8%) CBG Random:: 152      Laboratory Results   Urine Tests    Routine Urinalysis   Color: lt. yellow Appearance: Clear Glucose: negative   (Normal Range: Negative) Bilirubin: negative   (Normal Range: Negative) Ketone: negative   (Normal Range: Negative) Spec. Gravity: 1.015   (Normal Range: 1.003-1.035) Blood: trace-intact   (Normal Range: Negative) pH: 5.0   (Normal Range: 5.0-8.0) Protein: negative   (Normal Range: Negative) Urobilinogen: 0.2   (Normal Range: 0-1) Nitrite: negative   (Normal Range: Negative) Leukocyte Esterace: negative   (Normal Range: Negative)     Blood Tests     HGBA1C: 8.2%   (Normal Range: Non-Diabetic - 3-6%   Control Diabetic - 6-8%) CBG Random:: 152mg /dL       Process Orders Check Orders Results:     [facility]: Check successful Tests Sent for requisitioning (April 23, 2009 4:45 PM):     04/23/2009: [facility] -- T-Basic Metabolic Panel [id number] (signed)     04/23/2009: [facility] -- T-Drug Screen-Urine, (single) [phone] (signed)     Prevention & Chronic Care Immunizations   Influenza vaccine: Fluvax MCR  (02/03/2009)   Influenza vaccine due: 12/10/2008    Tetanus booster: Not documented   Td booster deferral: Deferred  (09/24/2008)    Pneumococcal vaccine: Not documented  Other Screening   Pap smear: Normal  (01/04/2006)   Pap smear action/deferral: Deferred-2 yr interval  (09/24/2008)    Mammogram: Normal  (01/04/2006)   Mammogram action/deferral: Ordered  (02/03/2009)   Smoking status: never  (04/23/2009)  Diabetes Mellitus   HgbA1C: 8.2  (04/23/2009)   Hemoglobin A1C due: 12/25/2008    Eye exam: Not documented   Diabetic eye exam action/deferral: Ophthalmology referral  (02/03/2009)    Foot exam: yes  (04/23/2009)   Foot exam action/deferral: Do today   High risk foot: Not documented   Foot care education: Done  (02/03/2009)   Foot exam due: 01/11/2007    Urine microalbumin/creatinine ratio: 7.4  (02/25/2008)  Lipids   Total Cholesterol: 159  (08/26/2008)   LDL: 78  (08/26/2008)   LDL Direct: Not documented   HDL: 67  (08/26/2008)  Triglycerides: 70  (08/26/2008)   Lipid panel due: 05/10/2006    SGOT (AST): 11  (08/26/2008)   SGPT (ALT): 9  (08/26/2008)   Alkaline phosphatase: 88  (08/26/2008)   Total bilirubin: 0.4  (08/26/2008)  Hypertension   Last Blood Pressure: 154 / 89  (04/23/2009)   Serum creatinine: 1.02  (08/26/2008)   Serum potassium 4.5  (08/26/2008)  Self-Management Support :   Personal Goals (by the next clinic visit) :     Personal A1C goal: 7  (09/24/2008)     Personal blood pressure goal: 130/80  (09/24/2008)      Personal LDL goal: 70  (09/24/2008)    Patient will work on the following items until the next clinic visit to reach self-care goals:     Medications and monitoring: take my medicines every day, check my blood sugar  (04/23/2009)     Eating: drink diet soda or water instead of juice or soda, eat foods that are low in salt, eat baked foods instead of fried foods  (04/23/2009)     Other: has weight surgery request for [place]-going to PT - may try water therapy  (12/04/2008)    Diabetes self-management support: Written self-care plan, Referred for DM self-management training  (09/24/2008)   Referred for diabetes self-mgmt training.    Hypertension self-management support: Written self-care plan  (09/24/2008)    Lipid self-management support: Written self-care plan  (09/24/2008)

## 2010-05-11 NOTE — Progress Notes (Signed)
Summary: "nerve med"/ hla  Phone Note Call from Patient   Summary of Call: pt again calls for her "nerve med", pt was informed that she will not be able to obtain that med and an appt is suggested, she is pleasant but not pleased, states she will keep her may appt and discuss at that time. Initial call taken by: Marin Roberts RN,  July 14, 2009 1:49 PM  Follow-up for Phone Call        I think we need to consider non-controlled medications for her anxiety and sleep issues. I am going to start her on Seroquel at bedtime for sleep and depression. Please call her and let her know I would like her to try this medication until I can see her in clinic. I will verify that it was filled. Let her know my intention is to not let her suffer so this is her option right now. If she truly is interested in controlling her symptoms she will try it at least untill I see her in May. Will send e-script. please call and let her know this is the plan. Follow-up by: Julaine Fusi  DO,  July 17, 2009 11:46 AM  Additional Follow-up for Phone Call Additional follow up Details #1::        Message left for pt. to call the clinic. Additional Follow-up by: Chinita Pester RN,  July 17, 2009 2:13 PM    Additional Follow-up for Phone Call Additional follow up Details #2::    called to pharm, she can be reached at [phone], she states she by order of drs. murphy/ wainer she will start epidural injections 4/12, wants to talk to you about them Follow-up by: Marin Roberts RN,  July 20, 2009 3:49 PM  New/Updated Medications: SEROQUEL 50 MG TABS (QUETIAPINE FUMARATE) Start 50mg (1 tab) by mouth at bedtime X1, then 100mg (2 tabs) by mouth at bedtime Prescriptions: SEROQUEL 50 MG TABS (QUETIAPINE FUMARATE) Start 50mg (1 tab) by mouth at bedtime X1, then 100mg (2 tabs) by mouth at bedtime  #61 x 0   Entered and Authorized by:   Julaine Fusi  DO   Signed by:   Julaine Fusi  DO on 07/17/2009   Method used:   Electronically to      [facility] #[id number]* (retail)       [address] [place], [place]  [address]       Ph: [phone]       Fax: [phone]   RxID:   [id number]

## 2010-05-11 NOTE — Progress Notes (Signed)
Summary: refill/gg  Phone Note Refill Request  on October 06, 2008 4:52 PM  Refills Requested: Medication #1:  HYDROCHLOROTHIAZIDE 25 MG TABS 1 by mouth daily  Method Requested: Electronic Initial call taken by: Merrie Roof RN,  October 06, 2008 4:52 PM  Follow-up for Phone Call        Refill approved-nurse to complete Follow-up by: Julaine Fusi  DO,  October 07, 2008 8:53 AM      Prescriptions: HYDROCHLOROTHIAZIDE 25 MG TABS (HYDROCHLOROTHIAZIDE) 1 by mouth daily  #30 x 12   Entered and Authorized by:   Julaine Fusi  DO   Signed by:   Julaine Fusi  DO on 10/07/2008   Method used:   Electronically to        [facility] [phone]* (retail)       [address]       [address]       [place], [place]  [address]       Ph: [phone]       Fax: [phone]   RxID:   [id number]

## 2010-05-11 NOTE — Progress Notes (Signed)
Summary: refill/gg  Phone Note Refill Request  on Aug 13, 2008 10:59 AM  Refills Requested: Medication #1:  ALPRAZOLAM 1 MG  TB24 Take 1 tablet by mouth three times a day as needed for anxiety  Medication #2:  FLUCONAZOLE 150 MG  TABS take 1 tablet by mouth once and may repeat in 1 week if itching/dyscharge persists   Last Refilled: 07/17/2008 alprazolam was called in # 90 1 refill   Method Requested: Telephone to Pharmacy Initial call taken by: Merrie Roof RN,  Aug 13, 2008 10:59 AM  Follow-up for Phone Call        Refill approved-nurse to complete Follow-up by: Julaine Fusi  DO,  Aug 14, 2008 9:37 AM  Additional Follow-up for Phone Call Additional follow up Details #1::        Rx called to pharmacy Additional Follow-up by: Merrie Roof RN,  Aug 14, 2008 5:12 PM      Prescriptions: FLUCONAZOLE 150 MG  TABS (FLUCONAZOLE) take 1 tablet by mouth once and may repeat in 1 week if itching/dyscharge persists  #1 x 0   Entered and Authorized by:   Julaine Fusi  DO   Signed by:   Julaine Fusi  DO on 08/14/2008   Method used:   Telephoned to ...       [facility] [phone]* (retail)       [address]       [address]       [place], [place]  [address]       Ph: [phone]       Fax: [phone]   RxID:   [id number] ALPRAZOLAM 1 MG  TB24 (ALPRAZOLAM) Take 1 tablet by mouth three times a day as needed for anxiety  #90 x 1   Entered and Authorized by:   Julaine Fusi  DO   Signed by:   Julaine Fusi  DO on 08/14/2008   Method used:   Telephoned to ...       [facility] [phone]* (retail)       [address]       [address]       [place], [place]  [address]       Ph: [phone]       Fax: [phone]   RxID:   [id number]

## 2010-05-12 ENCOUNTER — Encounter: Payer: Self-pay | Admitting: Internal Medicine

## 2010-05-13 NOTE — Assessment & Plan Note (Signed)
Summary: Soc. Work  15  min.  Patient updated me as to her situation.  She is now sharing housing with her mother.   She has Medicare and receives $1100 per month Soc. Security plus husband's social security. Does not qualify for Medicaid. However, med copays are around $4 to $5 each.  Patient looking for gas money and copay money. I declined since we gave her $75 back in June/advised her we were tapped out of funds.   Christmas bag given.

## 2010-05-13 NOTE — Assessment & Plan Note (Signed)
Summary: EST-CK/FU/MEDS/CFB   Vital Signs:  Patient profile:   50 year old female Height:      69 inches (175.26 cm) Weight:      361 pounds (164.09 kg) BMI:     53.50 Temp:     97.2 degrees F (36.22 degrees C) oral Pulse rate:   84 / minute BP sitting:   145 / 73  (right arm) Cuff size:   large  Vitals Entered By: Angelina Ok RN (January 21, 2010 2:04 PM) Is Patient Diabetic? Yes Did you bring your meter with you today? No Pain Assessment Patient in pain? yes     Location: side Intensity: 10 Type: aching Onset of pain  Constant Nutritional Status BMI of > 30 = obese  Have you ever been in a relationship where you felt threatened, hurt or afraid?No   Does patient need assistance? Functional Status Self care Ambulation Impaired:Risk for fall Comments Ambulates with a cane. Check up.   ?pulled a musclein her back.   Primary Care Provider:  Julaine Fusi  DO   History of Present Illness: Maria [name] comes in today for routine follow-up.  Her major concerns are her pain from severe OA in her knees and ankles from morbid obesity and severe anxiety. She reports that she has not been doing well keeping up with her diabtetes medications but wants to get back on track today. Complains of fatigue. She is currently unemployed, on disbility. Lives with her mother now. She has a very complex psychiatric/social history outlined in prior notes. She reports that [facility] is managing her pain and that someone at mental health is treating her depression and anxiety-she however is asking for anxiety medication because it "helps with her muscle spasms".   Depression History:      The patient denies a depressed mood most of the day and a diminished interest in her usual daily activities.        Comments:  Goes to [employer].   Preventive Screening-Counseling & Management  Alcohol-Tobacco     Alcohol drinks/day: 0     Smoking Status: never  Current Medications  (verified): 1)  Toprol Xl 200 Mg Tb24 (Metoprolol Succinate) .... Take 1 Tablet By Mouth Once A Day 2)  Lotensin 40 Mg Tabs (Benazepril Hcl) .Marland Kitchen.. 1 By Mouth Daily 3)  Glucophage 1000 Mg Tabs (Metformin Hcl) .Marland Kitchen.. 1 By Mouth Two Times A Day 4)  Glucotrol 5 Mg Tabs (Glipizide) .... Take 1 Tablet By Mouth Two Times A Day 5)  Lantus Solostar 100 Unit/ml Soln (Insulin Glargine) .... Inject 45 Units Subcutaneously At Bedtime 6)  Protonix 40 Mg  Tbec (Pantoprazole Sodium) .... Take 1 Tablet By Mouth Once A Day 7)  Vitamin D3 1000 Unit Caps (Cholecalciferol) .... Take 1 Tablet By Mouth Once A Day 8)  Hydrochlorothiazide 25 Mg Tabs (Hydrochlorothiazide) .... Take 1 Tablet By Mouth Every Morning 9)  Gabapentin 800 Mg Tabs (Gabapentin) .... Take 1 Tablet By Mouth Two Times A Day 10)  Colcrys 0.6 Mg Tabs (Colchicine) .... Take One Tab By Mouth Two Times A Day For 5 Days, Then 1 Tab Daily. 11)  Bd Pen Needle Short U/f 31g X 8 Mm Misc (Insulin Pen Needle) .... Use As Directed Once Daily With Lantus Solostar Pen 12)  Senokot S 8.6-50 Mg Tabs (Sennosides-Docusate Sodium) .... Take 1 Tablet By Mouth Two Times A Day As Needed 13)  Chlordiazepoxide Hcl 10 Mg Caps (Chlordiazepoxide Hcl) .... Tke One Tab By Mouth Once  Daily For Muscle Spasm  Allergies (verified): 1)  ! Darvocet  Review of Systems      See HPI  Physical Exam  General:  alert, well-developed, and well-hydrated.   Head:  normocephalic and atraumatic.   Eyes:  vision grossly intact, pupils equal, and pupils round.   Mouth:  poor dentition.   Neck:  supple.   Lungs:  normal respiratory effort and normal breath sounds.   Heart:  normal rate, regular rhythm, and no murmur.   Abdomen:  soft, non-tender, and normal bowel sounds.   Msk:  Swollen but non-tender ankles bilaterally r>l non-pitting, OA changes noteable.   Bilateral knee tenderness along the joint line, ROM with crepitus. Pulses:  normal Extremities:  as above Neurologic:  alert &  oriented X3.   Skin:  color normal, no rashes, no suspicious lesions, and no ecchymoses.   Psych:  Oriented X3, normally interactive, good eye contact, and not depressed appearing.  Mildly anxious and slightly pressured speech.   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 She is taking Opana and Oxycodone prescribed by their office. She has violated her pain contract in this office on multiple occasions. We have a strict policy about opiate prescribing in this setting and will no longer prescribe these medications for her. I advised her to focus on weight loss and activity as much as possible.  The following medications were removed from the medication list:    Carisoprodol 350 Mg Tabs (Carisoprodol) ..... One by mouth three times a day    Indomethacin 50 Mg Caps (Indomethacin) .Marland Kitchen... Take 1 tab bye mouth every 12hrs as needed for pain.    Ibuprofen 800 Mg Tabs (Ibuprofen) .Marland Kitchen... 1 by mouth two times a day as needed knee pain (do not take with indomethacin) Her updated medication list for this problem includes:    Carisoprodol 350 Mg Tabs (Carisoprodol) .Marland Kitchen... Take 1 by mouth three times a day  Problem # 2:  GOUT, UNSPECIFIED (ICD-274.9) Continue colchrys, but no fluid eval done-meds seem to be helpful.  Her updated medication list for this problem includes:    Colcrys 0.6 Mg Tabs (Colchicine) .Marland Kitchen... Take one tab by mouth two times a day for 5 days, then 1 tab daily.  Problem # 3:  ANXIETY STATE NOS (ICD-300.00) Needs to f.u wioth her psychiatrist no alprazolam from our office.  The following medications were removed from the medication list:    Fluoxetine Hcl 40 Mg Caps (Fluoxetine hcl) .Marland Kitchen... Take 1 tablet by mouth once a day    Trazodone Hcl 150 Mg Tabs (Trazodone hcl) ..... Start one tab by mouth at bedtime for 3 days then increase to 2 tablets po at bedtime Her updated medication list for  this problem includes:    Chlordiazepoxide Hcl 10 Mg Caps (Chlordiazepoxide hcl) .Marland Kitchen... Tke one tab by mouth once daily for muscle spasm  Problem # 4:  HYPERTENSION (ICD-401.9) Not taking her medications- ?elevated due to pain and anxiety.  Her updated medication list for this problem includes:    Toprol Xl 200 Mg Tb24 (Metoprolol succinate) .Marland Kitchen... Take 1 tablet by mouth once a day    Lotensin 40 Mg Tabs (Benazepril hcl) .Marland Kitchen... 1 by mouth daily    Hydrochlorothiazide 25 Mg Tabs (Hydrochlorothiazide) .Marland Kitchen... Take 1 tablet by mouth every morning  Orders: T-Basic Metabolic Panel [phone])  y  Complete Medication List: 1)  Toprol Xl 200 Mg Tb24 (Metoprolol  succinate) .... Take 1 tablet by mouth once a day 2)  Lotensin 40 Mg Tabs (Benazepril hcl) .Marland Kitchen.. 1 by mouth daily 3)  Glucophage 1000 Mg Tabs (Metformin hcl) .Marland Kitchen.. 1 by mouth two times a day 4)  Glucotrol 5 Mg Tabs (Glipizide) .... Take 1 tablet by mouth two times a day 5)  Lantus Solostar 100 Unit/ml Soln (Insulin glargine) .... Inject 50 units subcutaneously at bedtime 6)  Protonix 40 Mg Tbec (Pantoprazole sodium) .... Take 1 tablet by mouth once a day 7)  Vitamin D3 1000 Unit Caps (Cholecalciferol) .... Take 1 tablet by mouth once a day 8)  Hydrochlorothiazide 25 Mg Tabs (Hydrochlorothiazide) .... Take 1 tablet by mouth every morning 9)  Gabapentin 800 Mg Tabs (Gabapentin) .... Take 1 tablet by mouth two times a day 10)  Colcrys 0.6 Mg Tabs (Colchicine) .... Take one tab by mouth two times a day for 5 days, then 1 tab daily. 11)  Bd Pen Needle Short U/f 31g X 8 Mm Misc (Insulin pen needle) .... Use as directed once daily with lantus solostar pen 12)  Senokot S 8.6-50 Mg Tabs (Sennosides-docusate sodium) .... Take 1 tablet by mouth two times a day as needed 13)  Chlordiazepoxide Hcl 10 Mg Caps (Chlordiazepoxide hcl) .... Tke one tab by mouth once daily for muscle spasm 14)  Accu-chek Softclix Lancets Misc (Lancets) .... Check blood sugar  every morning before breakfast 15)  Accu-chek Aviva Strp (Glucose blood) .... Check your bllod sugar daily before breakfast 16)  Fluconazole 150 Mg Tabs (Fluconazole) .... Take one tablet by mouth 17)  Carisoprodol 350 Mg Tabs (Carisoprodol) .... Take 1 by mouth three times a day 18)  Commode Bedside Misc (Misc. devices) .... Dx: knee arthritis-advanced 19)  Doxycycline Hyclate 100 Mg Tabs (Doxycycline hyclate) .... Take 1 tablet by mouth two times a day  Other Orders: T- Capillary Blood Glucose (11914) T-Hgb A1C (in-house) (78295AO) Influenza Vaccine MCR (13086) Pneumococcal Vaccine (57846) Admin 1st Vaccine (96295)  Patient Instructions: 1)  Please schedule a follow-up appointment in 3 months. Prescriptions: CHLORDIAZEPOXIDE HCL 10 MG CAPS (CHLORDIAZEPOXIDE HCL) Tke one tab by mouth once daily for muscle spasm  #30 x 0   Entered and Authorized by:   Julaine Fusi  DO   Signed by:   Julaine Fusi  DO on 01/21/2010   Method used:   Print then Give to Patient   RxID:   [id number] SENOKOT S 8.6-50 MG TABS (SENNOSIDES-DOCUSATE SODIUM) Take 1 tablet by mouth two times a day as needed  #60 x 2   Entered and Authorized by:   Julaine Fusi  DO   Signed by:   Julaine Fusi  DO on 01/21/2010   Method used:   Electronically to        [facility] [phone]* (retail)       [address]       [address]       [place], [place]  [address]       Ph: [phone]       Fax: [phone]   RxID:   [id number] GABAPENTIN 800 MG TABS (GABAPENTIN) Take 1 tablet by mouth two times a day  #60 x 3   Entered and Authorized by:   Julaine Fusi  DO   Signed by:   Julaine Fusi  DO on 01/21/2010   Method used:   Electronically to        [facility] [phone]* (retail)  [address]       [address]       [place], [place]  [address]       Ph: [phone]       Fax: [phone]   RxID:   [id number]   Prevention & Chronic Care Immunizations   Influenza vaccine:  Fluvax MCR  (01/21/2010)   Influenza vaccine deferral: Not indicated  (09/30/2009)   Influenza vaccine due: 12/10/2008    Tetanus booster: Not documented   Td booster deferral: Not indicated  (09/30/2009)    Pneumococcal vaccine: Pneumovax  (01/21/2010)  Colorectal Screening   Hemoccult: Not documented   Hemoccult action/deferral: Not indicated  (09/30/2009)    Colonoscopy: Not documented   Colonoscopy action/deferral: Not indicated  (09/30/2009)  Other Screening   Pap smear: Normal  (01/04/2006)   Pap smear action/deferral: Deferred  (09/30/2009)    Mammogram: Normal  (01/04/2006)   Mammogram action/deferral: Refused  (09/30/2009)   Smoking status: never  (01/21/2010)  Diabetes Mellitus   HgbA1C: 8.0  (01/21/2010)   Hemoglobin A1C due: 12/25/2008    Eye exam: Not documented   Diabetic eye exam action/deferral: Ophthalmology referral  (02/03/2009)    Foot exam: yes  (04/23/2009)   Foot exam action/deferral: Do today   High risk foot: Not documented   Foot care education: Done  (02/03/2009)   Foot exam due: 01/11/2007    Urine microalbumin/creatinine ratio: 7.4  (02/25/2008)  Lipids   Total Cholesterol: 160  (10/01/2009)   LDL: 76  (10/01/2009)   LDL Direct: Not documented   HDL: 65  (10/01/2009)   Triglycerides: 94  (10/01/2009)   Lipid panel due: 05/10/2006    SGOT (AST): 11  (08/26/2008)   BMP action: Ordered   SGPT (ALT): 9  (08/26/2008)   Alkaline phosphatase: 88  (08/26/2008)   Total bilirubin: 0.4  (08/26/2008)  Hypertension   Last Blood Pressure: 145 / 73  (01/21/2010)   Serum creatinine: 0.97  (10/09/2009)   Serum potassium 4.3  (10/09/2009)  Self-Management Support :   Personal Goals (by the next clinic visit) :     Personal A1C goal: 7  (09/24/2008)     Personal blood pressure goal: 130/80  (09/24/2008)     Personal LDL goal: 70  (09/24/2008)    Patient will work on the following items until the next clinic visit to reach self-care goals:      Medications and monitoring: take my medicines every day, check my blood sugar, bring all of my medications to every visit  (01/21/2010)     Eating: drink diet soda or water instead of juice or soda, eat more vegetables, use fresh or frozen vegetables, eat foods that are low in salt, eat baked foods instead of fried foods, eat fruit for snacks and desserts, limit or avoid alcohol  (01/21/2010)     Activity: take a 30 minute walk every day  (01/21/2010)     Other: has weight surgery request for [facility]-going to PT - may try water therapy  (12/04/2008)    Diabetes self-management support: Written self-care plan, Education handout, Pre-printed educational material, Resources for patients handout  (01/21/2010)   Diabetes care plan printed   Diabetes education handout printed    Hypertension self-management support: Written self-care plan, Education handout, Pre-printed educational material, Resources for patients handout  (01/21/2010)   Hypertension self-care plan printed.   Hypertension education handout printed    Lipid self-management support: Written self-care plan, Education handout, Pre-printed educational material, Resources for patients  handout  (01/21/2010)   Lipid self-care plan printed.   Lipid education handout printed      Resource handout printed.  Laboratory Results   Blood Tests   Date/Time Received: January 21, 2010 2:27 PM Date/Time Reported: Burke Keels  January 21, 2010 2:27 PM   HGBA1C: 8.0%   (Normal Range: Non-Diabetic - 3-6%   Control Diabetic - 6-8%) CBG Fasting:: 97mg /dL       Immunizations Administered:  Influenza Vaccine # 1:    Vaccine Type: Fluvax MCR    Site: right deltoid    Mfr: GlaxoSmithKline    Dose: 0.5 ml    Route: IM    Given by: Angelina Ok RN    Exp. Date: 0630/2012    Lot #: [id number]    VIS given: 11/03/09 version given January 21, 2010.  Pneumonia Vaccine:    Vaccine Type: Pneumovax    Site: right deltoid    Mfr: Merck     Dose: 0.5 ml    Route: IM    Given by: Angelina Ok RN    Exp. Date: 06/24/2011    Lot #: [id number]    VIS given: 03/16/09 version given January 21, 2010.  Flu Vaccine Consent Questions:    Do you have a history of severe allergic reactions to this vaccine? no    Any prior history of allergic reactions to egg and/or gelatin? no    Do you have a sensitivity to the preservative Thimersol? no    Do you have a past history of Guillan-Barre Syndrome? no    Do you currently have an acute febrile illness? no    Have you ever had a severe reaction to latex? no    Vaccine information given and explained to patient? yes    Are you currently pregnant? no  Process Orders Check Orders Results:     [facility]: Check successful Tests Sent for requisitioning (April 22, 2010 10:32 PM):     01/21/2010: [facility] -- T-Basic Metabolic Panel [phone] (signed)

## 2010-05-13 NOTE — Op Note (Signed)
Summary: Consent  Consent   Imported By: Marily Memos 03/24/2010 14:43:17  _____________________________________________________________________  External Attachment:    Type:   Image     Comment:   External Document

## 2010-05-13 NOTE — Assessment & Plan Note (Signed)
Summary: f/u ED 12/12, hi cbg, reaction to med/pcp-golding/hla   Vital Signs:  Patient profile:   50 year old female Height:      69 inches (175.26 cm) Weight:      360.1 pounds (163.68 kg) Temp:     97.6 degrees F oral Pulse rate:   82 / minute BP sitting:   145 / 91  (right arm) Cuff size:   large  Vitals Entered By: Chinita Pester RN (March 24, 2010 9:24 AM) CC: ED f/u for allergic to medication; last night had hives.  Also CBG's have been high. Refill on Diflucan for ? yeast infection. Is Patient Diabetic? Yes Did you bring your meter with you today? No Pain Assessment Patient in pain? yes     Location: left knee Intensity: 8 Type: aching Onset of pain  Intermittent;felled Nutritional Status BMI of > 30 = obese  Have you ever been in a relationship where you felt threatened, hurt or afraid?No   Does patient need assistance? Functional Status Self care, Cook/clean, Shopping Ambulation Impaired:Risk for fall Comments using a cane.   Primary Care Sharde Gover:  Julaine Fusi  DO  CC:  ED f/u for allergic to medication; last night had hives.  Also CBG's have been high. Refill on Diflucan for ? yeast infection.Marland Kitchen  History of Present Illness: 1. DM follow up. Patient skips meals; eats only one meal a day "to loose weight." does nto check her BG -lack of a glucometer. Denies any hypoglycemic events. 2. F/u from ED on 03/22/10 for urticaria and hyperglycemia with BG in 300's. Please, refer to ED reporrt. patient reports resolution of her Sx. 3. Depression --non-compliant with Prozac "b/c did not have money." Sees a psychiatrist at a behavioral health. Lost her son and a duaghter is being incarcedrated  -->all of the events within past year. Denies SI/HI or mania.   Diabetic Foot Exam Foot Inspection Is there a history of a foot ulcer?              No Is there a foot ulcer now?              No Is there swelling or an abnormal foot shape?          No Are the toenails long?                 No Are the toenails thick?                No Are the toenails ingrown?              No Is there heavy callous build-up?              No Is there pain in the calf muscle (Intermittent claudication) when walking?    NoIs there a claw toe deformity?              No Is there elevated skin temperature?            No Is there limited ankle dorsiflexion?            No Is there foot or ankle muscle weakness?            No  Diabetic Foot Care Education Patient educated on appropriate care of diabetic feet.  Pulse Check          Right Foot          Left Foot Posterior Tibial:        normal  normal Dorsalis Pedis:        normal            normal  High Risk Feet? Yes Set Next Diabetic Foot Exam here: 03/25/2011   10-g (5.07) Semmes-Weinstein Monofilament Test           Right Foot          Left Foot Visual Inspection               Test Control      normal         normal Site 1         normal         normal Site 2         normal         normal Site 3         normal         normal Site 4         normal         normal Site 5         normal         normal Site 6         normal         normal Site 7         normal         normal Site 8         normal         normal Site 9         normal         normal Site 10         normal         normal  Impression      normal         normal  Current Problems (verified): 1)  Diabetes Mellitus, Type II  (ICD-250.00) 2)  Hypertension  (ICD-401.9) 3)  Hyperlipidemia  (ICD-272.4) 4)  Gout, Unspecified  (ICD-274.9) 5)  Nephrolithiasis, Recurrent  (ICD-592.0) 6)  Urinary Tract Infection, Recurrent  (ICD-599.0) 7)  Osteoarthritis, Knees, Bilateral, 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 Take 1 Tablet By Mouth Once A Day 2)  Lotensin 40 Mg Tabs (Benazepril Hcl) .Marland Kitchen.. 1 By Mouth Daily 3)  Glucophage 1000 Mg Tabs (Metformin Hcl) .Marland Kitchen.. 1 By Mouth Two Times A Day 4)  Glucotrol 5 Mg Tabs (Glipizide) .... Take 1 Tablet By Mouth Two Times A Day 5)  Lantus Solostar 100 Unit/ml Soln (Insulin Glargine) .... Inject 45 Units Subcutaneously At Bedtime 6)  Protonix 40 Mg  Tbec (Pantoprazole Sodium) .... Take 1 Tablet By Mouth Once A Day 7)  Vitamin D3 1000 Unit Caps (Cholecalciferol) .... Take 1 Tablet By Mouth Once A Day 8)  Hydrochlorothiazide 25 Mg Tabs (Hydrochlorothiazide) .... Take 1 Tablet By Mouth Every Morning 9)  Gabapentin 800 Mg Tabs (Gabapentin) .... Take 1 Tablet By Mouth Two Times A Day 10)  Colcrys 0.6 Mg  Tabs (Colchicine) .... Take One Tab By Mouth Two Times A Day For 5 Days, Then 1 Tab Daily. 11)  Bd Pen Needle Short U/f 31g X 8 Mm Misc (Insulin Pen Needle) .... Use As Directed Once Daily With Lantus Solostar Pen 12)  Senokot S 8.6-50 Mg Tabs (Sennosides-Docusate Sodium) .... Take 1 Tablet By Mouth Two Times A Day As Needed 13)  Chlordiazepoxide Hcl 10 Mg Caps (Chlordiazepoxide Hcl) .... Tke One Tab By Mouth Once Daily For Muscle Spasm 14)  Diflucan 200 Mg Tabs (Fluconazole) .... Take 1 Tab By Mouth Daily For 3 Days.  Current Medications (verified): 1)  Toprol Xl 200 Mg Tb24 (Metoprolol Succinate) .... Take 1 Tablet By Mouth Once A Day 2)  Lotensin 40 Mg Tabs (Benazepril Hcl) .Marland Kitchen.. 1 By Mouth Daily 3)  Glucophage 1000 Mg Tabs (Metformin Hcl) .Marland Kitchen.. 1 By Mouth Two Times A Day 4)  Glucotrol 5 Mg Tabs (Glipizide) .... Take 1 Tablet By Mouth Two Times A Day 5)  Lantus Solostar 100 Unit/ml Soln (Insulin Glargine)  .... Inject 45 Units Subcutaneously At Bedtime 6)  Protonix 40 Mg  Tbec (Pantoprazole Sodium) .... Take 1 Tablet By Mouth Once A Day 7)  Vitamin D3 1000 Unit Caps (Cholecalciferol) .... Take 1 Tablet By Mouth Once A Day 8)  Hydrochlorothiazide 25 Mg Tabs (Hydrochlorothiazide) .... Take 1 Tablet By Mouth Every Morning 9)  Gabapentin 800 Mg Tabs (Gabapentin) .... Take 1 Tablet By Mouth Two Times A Day 10)  Colcrys 0.6 Mg Tabs (Colchicine) .... Take One Tab By Mouth Two Times A Day For 5 Days, Then 1 Tab Daily. 11)  Bd Pen Needle Short U/f 31g X 8 Mm Misc (Insulin Pen Needle) .... Use As Directed Once Daily With Lantus Solostar Pen 12)  Senokot S 8.6-50 Mg Tabs (Sennosides-Docusate Sodium) .... Take 1 Tablet By Mouth Two Times A Day As Needed 13)  Chlordiazepoxide Hcl 10 Mg Caps (Chlordiazepoxide Hcl) .... Tke One Tab By Mouth Once Daily For Muscle Spasm  Allergies (verified): 1)  ! Darvocet  Directives (verified): 1)  Pain Management Contract 2)  Violation Pain Contract No Pill Bottles, Neg Uds, Lost Scripts- Do Not Refilll 3)  No Benzodiazapines Defer To [facility]   Past History:  Past medical, surgical, family and social histories (including risk factors) reviewed, and no changes noted (except as noted below).  Past Medical History: Reviewed history from 02/25/2008 and no changes required. Hypertension Diabetes mellitus, type II Hyperlipidemia Obesity h/o dysfunctional uterine bleeding allergic rhinitis s/p tubal lugation Anxiety h/o chest pain w neg cardiolite in 2005 kidney stones with extraction 2009  Family History: Reviewed history from 03/20/2007 and no changes required. 1st degree relatives with: Obesity Diabetes Hypertension CAD  Social History: Reviewed history from 08/10/2009 and no changes required. Separated. No ETOH. No Tobacco. No Drugs.  Physical Exam  General:  Vitals reviewed.  Hypertensive.  No acute distress. Head:  Normocephalic and atraumatic  without obvious abnormalities. No apparent alopecia or balding. Eyes:  pupils equal, pupils round, and pupils reactive to light.   Ears:  R ear normal and L ear normal.   Nose:  no external erythema and no nasal discharge.   Mouth:  good dentition, no gingival abnormalities, and pharynx pink and moist.   Neck:  No deformities, masses, or tenderness noted. Lungs:  normal respiratory effort.   Heart:  normal rate, regular rhythm, and no murmur.   Abdomen:  Bowel sounds positive,abdomen soft and non-tender without masses, organomegaly or hernias  noted. No CVA tenderness bilaterally. Msk:  normal ROM, no joint swelling, no joint warmth, and no redness over joints.   Pulses:  pedal pulses 1+ /4 bialterally. Extremities:  No clubbing, cyanosis, edema, or deformity noted with normal full range of motion of all joints.   Neurologic:  alert & oriented X3.   Skin:  no rashes.   Psych:  Oriented X3 and tearful.  depressed affect.    Diabetes Management Exam:    Foot Exam (with socks and/or shoes not present):       Sensory-Monofilament:          Left foot: normal          Right foot: normal   Impression & Recommendations:  Problem # 1:  DIABETES MELLITUS, TYPE II (ICD-250.00) Assessment Unchanged Will increase lantus dose to 50 Units Subcutaneously hs. Given a Rx fro a glucometer and referred for a DME and social worker to address financial strain. Foot care, diet and weight managment addressed. will follow up in 1 month. Her updated medication list for this problem includes:    Lotensin 40 Mg Tabs (Benazepril hcl) .Marland Kitchen... 1 by mouth daily    Glucophage 1000 Mg Tabs (Metformin hcl) .Marland Kitchen... 1 by mouth two times a day    Glucotrol 5 Mg Tabs (Glipizide) .Marland Kitchen... Take 1 tablet by mouth two times a day    Lantus Solostar 100 Unit/ml Soln (Insulin glargine) ..... Inject 50 units subcutaneously at bedtime  Orders: T-Hgb A1C (in-house) (09811BJ) Ophthalmology Referral (Ophthalmology) T-Urine Microalbumin  w/creat. ratio [phone]) T- Capillary Blood Glucose (78469) DME Referral (DME)  Labs Reviewed: Creat: 0.77 (01/21/2010)    Reviewed HgBA1c results: 8.3 (03/24/2010)  8.2 (08/10/2009)  Problem # 2:  HYPERTENSION (ICD-401.9) Assessment: Deteriorated  Noncompliant with her medications due to a financial strain. Risks of HTn reviewed with the patient. Patient is to see Georges Mouse. Will restart her anti-HTN meds and foloow up in 1 months or sooner. Her updated medication list for this problem includes:    Toprol Xl 200 Mg Tb24 (Metoprolol succinate) .Marland Kitchen... Take 1 tablet by mouth once a day    Lotensin 40 Mg Tabs (Benazepril hcl) .Marland Kitchen... 1 by mouth daily    Hydrochlorothiazide 25 Mg Tabs (Hydrochlorothiazide) .Marland Kitchen... Take 1 tablet by mouth every morning  Orders: Ophthalmology Referral (Ophthalmology)  BP today: 145/91 Prior BP: 152/102 (12/25/2009)  Labs Reviewed: K+: 4.1 (01/21/2010) Creat: : 0.77 (01/21/2010)   Chol: 160 (10/01/2009)   HDL: 65 (10/01/2009)   LDL: 76 (10/01/2009)   TG: 94 (10/01/2009)  Problem # 3:  HYPERLIPIDEMIA (ICD-272.4) Assessment: Unchanged Diet, exercise, weight management reviewed. Labs Reviewed: SGOT: 11 (08/26/2008)   SGPT: 9 (08/26/2008)   HDL:65 (10/01/2009), 67 (08/26/2008)  LDL:76 (10/01/2009), 78 (08/26/2008)  Chol:160 (10/01/2009), 159 (08/26/2008)  Trig:94 (10/01/2009), 70 (08/26/2008)  Problem # 4:  DEPRESSIVE DISORDER NOT ELSEWHERE CLASSIFIED (ICD-311) will restart Fluoxetine. f/u with a psychiatrist at a Behavioral Health. Her updated medication list for this problem includes:    Chlordiazepoxide Hcl 10 Mg Caps (Chlordiazepoxide hcl) .Marland Kitchen... Tke one tab by mouth once daily for muscle spasm  Orders: Social Work Referral (Social )  Discussed treatment options, including trial of antidpressant medication. Will refer to behavioral health. Follow-up call in in 24-48 hours and recheck in 2 weeks, sooner as needed. Patient agrees to call if  any worsening of symptoms or thoughts of doing harm arise. Verified that the patient has no suicidal ideation at this time.   Complete Medication List: 1)  Toprol Xl 200 Mg Tb24 (Metoprolol succinate) .... Take 1 tablet by mouth once a day 2)  Lotensin 40 Mg Tabs (Benazepril hcl) .Marland Kitchen.. 1 by mouth daily 3)  Glucophage 1000 Mg Tabs (Metformin hcl) .Marland Kitchen.. 1 by mouth two times a day 4)  Glucotrol 5 Mg Tabs (Glipizide) .... Take 1 tablet by mouth two times a day 5)  Lantus Solostar 100 Unit/ml Soln (Insulin glargine) .... Inject 50 units subcutaneously at bedtime 6)  Protonix 40 Mg Tbec (Pantoprazole sodium) .... Take 1 tablet by mouth once a day 7)  Vitamin D3 1000 Unit Caps (Cholecalciferol) .... Take 1 tablet by mouth once a day 8)  Hydrochlorothiazide 25 Mg Tabs (Hydrochlorothiazide) .... Take 1 tablet by mouth every morning 9)  Gabapentin 800 Mg Tabs (Gabapentin) .... Take 1 tablet by mouth two times a day 10)  Colcrys 0.6 Mg Tabs (Colchicine) .... Take one tab by mouth two times a day for 5 days, then 1 tab daily. 11)  Bd Pen Needle Short U/f 31g X 8 Mm Misc (Insulin pen needle) .... Use as directed once daily with lantus solostar pen 12)  Senokot S 8.6-50 Mg Tabs (Sennosides-docusate sodium) .... Take 1 tablet by mouth two times a day as needed 13)  Chlordiazepoxide Hcl 10 Mg Caps (Chlordiazepoxide hcl) .... Tke one tab by mouth once daily for muscle spasm 14)  Accu-chek Softclix Lancets Misc (Lancets) .... Check blood sugar every morning before breakfast 15)  Accu-chek Aviva Strp (Glucose blood) .... Check your bllod sugar daily before breakfast 16)  Fluconazole 150 Mg Tabs (Fluconazole) .... Take one tablet by mouth  Other Orders: Mammogram (Screening) (Mammo) T-Hepatic Function [phone]) T-Urinalysis Dipstick only (09811BJ)  Patient Instructions: 1)  Please, schedule a separate appointment for a Well Woman Exam. 2)  Please, take all your medications as prescribed.  3)  Please,  INCREASE Lantus to 50 Units before bedtime. 4)  Please, follow up with an eye doctor -- because of high blood pressure and diabetes, you should have an eye exam every year. 5)  please, mak an appointment with Jamison Neighbor and donna Tessiatore. 6)  Please, call with any questions. Prescriptions: FLUCONAZOLE 150 MG TABS (FLUCONAZOLE) Take one tablet by mouth  #1 x 2   Entered and Authorized by:   Deatra Robinson MD   Signed by:   Deatra Robinson MD on 03/24/2010   Method used:   Electronically to        [facility] [address] [phone]* (retail)       [address]       [address]       [place], [place]  [address]       Ph: [phone]       Fax: [phone]   RxID:   [id number] ACCU-CHEK AVIVA  STRP (GLUCOSE BLOOD) Check your bllod sugar daily before breakfast  #1 box x 11   Entered and Authorized by:   Deatra Robinson MD   Signed by:   Deatra Robinson MD on 03/24/2010   Method used:   Electronically to        [facility] [address] [phone]* (retail)       [address]       [address]       [place], [place]  [address]       Ph: [phone]       Fax: [phone]   RxID:   [id number] ACCU-CHEK SOFTCLIX LANCETS  MISC (LANCETS) check blood sugar every morning  before breakfast  #1 box x 11   Entered and Authorized by:   Deatra Robinson MD   Signed by:   Deatra Robinson MD on 03/24/2010   Method used:   Electronically to        [facility] [address] [phone]* (retail)       [address]       [address]       [place], [place]  [address]       Ph: [phone]       Fax: [phone]   RxID:   [id number] LANTUS SOLOSTAR 100 UNIT/ML SOLN (INSULIN GLARGINE) inject 50 units subcutaneously at bedtime  #1 vial x 11   Entered and Authorized by:   Deatra Robinson MD   Signed by:   Deatra Robinson MD on 03/24/2010   Method used:   Electronically to        [facility] [address] [phone]* (retail)       [address]       [address]       [place], [place]  [address]        Ph: [phone]       Fax: [phone]   RxID:   [id number]    Orders Added: 1)  Mammogram (Screening) [Mammo] 2)  T-Hgb A1C (in-house) [16010XN] 3)  Ophthalmology Referral [Ophthalmology] 4)  T-Urine Microalbumin w/creat. ratio [[id number]] 5)  T-Hepatic Function [80076-22960] 6)  Est. Patient Level IV [23557] 7)  T-Urinalysis Dipstick only [81003QW] 8)  T- Capillary Blood Glucose [82948] 9)  DME Referral [DME] 10)  Social Work Referral Blush.[email or web address] ]    Prevention & Chronic Care Immunizations   Influenza vaccine: Fluvax MCR  (02/03/2009)   Influenza vaccine deferral: Not indicated  (09/30/2009)   Influenza vaccine due: 12/11/2010    Tetanus booster: Not documented   Td booster deferral: Not indicated  (09/30/2009)    Pneumococcal vaccine: Not documented  Colorectal Screening   Hemoccult: Not documented   Hemoccult action/deferral: Not indicated  (09/30/2009)    Colonoscopy: Not documented   Colonoscopy action/deferral: Not indicated  (09/30/2009)  Other Screening   Pap smear: Normal  (01/04/2006)   Pap smear action/deferral: Deferred  (09/30/2009)   Pap smear due: 04/23/2010    Mammogram: Normal  (01/04/2006)   Mammogram action/deferral: Ordered  (03/24/2010)   Mammogram due: 01/05/2008   Smoking status: never  (09/30/2009)  Diabetes Mellitus   HgbA1C: 8.3  (03/24/2010)   HgbA1C action/deferral: Ordered  (03/24/2010)   Hemoglobin A1C due: 06/23/2010    Eye exam: Not documented   Diabetic eye exam action/deferral: Ophthalmology referral  (03/24/2010)    Foot exam: yes  (03/24/2010)   Foot exam action/deferral: Do today   High risk foot: Yes  (03/24/2010)   Foot care education: Done  (03/24/2010)   Foot exam due: 03/25/2011    Urine microalbumin/creatinine ratio: 7.4  (02/25/2008)   Urine microalbumin action/deferral: Ordered   Urine microalbumin/cr due: 03/25/2011    Diabetes flowsheet reviewed?: Yes   Progress toward A1C goal:  Unchanged    Stage of readiness to change (diabetes management): Action  Lipids   Total Cholesterol: 160  (10/01/2009)   LDL: 76  (10/01/2009)   LDL Direct: Not documented   HDL: 65  (10/01/2009)   Triglycerides: 94  (10/01/2009)   Lipid panel due: 09/23/2010    SGOT (AST): 11  (08/26/2008)   BMP action: Ordered   SGPT (ALT): 9  (08/26/2008)   Alkaline phosphatase: 88  (08/26/2008)  Total bilirubin: 0.4  (08/26/2008)   Liver panel due: 03/25/2011    Lipid flowsheet reviewed?: Yes   Progress toward LDL goal: Unchanged    Stage of readiness to change (lipid management): Maintenance  Hypertension   Last Blood Pressure: 145 / 91  (03/24/2010)   Serum creatinine: 0.77  (01/21/2010)   Serum potassium 4.1  (01/21/2010)   Basic metabolic panel due: 01/22/2011    Hypertension flowsheet reviewed?: Yes   Progress toward BP goal: Unchanged    Stage of readiness to change (hypertension management): Action  Self-Management Support :   Personal Goals (by the next clinic visit) :     Personal A1C goal: 7  (09/24/2008)     Personal blood pressure goal: 130/80  (09/24/2008)     Personal LDL goal: 70  (09/24/2008)    Diabetes self-management support: Education handout, Written self-care plan, Referred for DM self-management training  (03/24/2010)   Diabetes care plan printed   Diabetes education handout printed    Hypertension self-management support: Written self-care plan  (03/24/2010)   Hypertension self-care plan printed.    Lipid self-management support: Written self-care plan  (03/24/2010)   Lipid self-care plan printed.   Nursing Instructions: Give Flu vaccine today Schedule screening mammogram (see order) HgbA1C today (see order) Refer for screening diabetic eye exam (see order) Diabetic foot exam today Refer for diabetes self-management training (see order) Give Pneumovax today Give tetanus booster today    Diabetes Self Management Training Referral Patient  Name: Maria [name] Date Of Birth: 11/01/1960 MRN: [id number] Current Diagnosis:  DIABETES MELLITUS, TYPE II (ICD-250.00) HYPERTENSION (ICD-401.9) HYPERLIPIDEMIA (ICD-272.4) GOUT, UNSPECIFIED (ICD-274.9) NEPHROLITHIASIS, RECURRENT (ICD-592.0) URINARY TRACT INFECTION, RECURRENT (ICD-599.0) OSTEOARTHRITIS, KNEES, BILATERAL, SEVERE (ICD-715.96) GERD (ICD-530.81) DEPRESSIVE DISORDER NOT ELSEWHERE CLASSIFIED (ICD-311) ANXIETY STATE NOS (ICD-300.00) ROTATOR CUFF SYNDROME, LEFT (ICD-726.10) CARPAL TUNNEL SYNDROME, LEFT (ICD-354.0) OBSTRUCTIVE SLEEP APNEA (ICD-327.23) ALLERGIC RHINITIS (ICD-477.9) DYSFUNCTIONAL UTERINE BLEEDING (ICD-626.8) TUBAL LIGATION, HX OF (ICD-V26.51) HEMORRHOIDS, EXTERNAL (ICD-455.3) MORBID OBESITY (ICD-278.01)   Complicating Conditions:  Process Orders Check Orders Results:     [facility]: Check successful Tests Sent for requisitioning (March 24, 2010 10:41 AM):     03/24/2010: [facility] -- T-Urine Microalbumin w/creat. ratio [[id number]] (signed)     03/24/2010: [facility] -- T-Hepatic Function [phone] (signed)    Laboratory Results   Urine Tests  Date/Time Received: March 24, 2010 10:09 AM  Date/Time Reported: Burke Keels  March 24, 2010 10:09 AM   Routine Urinalysis   Color: lt. yellow Appearance: Cloudy Glucose: negative   (Normal Range: Negative) Bilirubin: negative   (Normal Range: Negative) Ketone: negative   (Normal Range: Negative) Spec. Gravity: 1.025   (Normal Range: 1.003-1.035) Blood: trace-intact   (Normal Range: Negative) pH: 5.5   (Normal Range: 5.0-8.0) Protein: 30   (Normal Range: Negative) Urobilinogen: 0.2   (Normal Range: 0-1) Nitrite: negative   (Normal Range: Negative) Leukocyte Esterace: trace   (Normal Range: Negative)     Blood Tests   Date/Time Received: March 24, 2010 9:58 AM  Date/Time Reported: Burke Keels  March 24, 2010 9:58 AM   HGBA1C: 8.3%   (Normal Range: Non-Diabetic - 3-6%   Control Diabetic - 6-8%) CBG Fasting:: 177mg /dL      Laboratory Results   Urine Tests  Date/Time Received: March 24, 2010 10:09 AM  Date/Time Reported: Burke Keels  March 24, 2010 10:09 AM   Routine Urinalysis   Color: lt. yellow Appearance: Cloudy Glucose: negative   (  Normal Range: Negative) Bilirubin: negative   (Normal Range: Negative) Ketone: negative   (Normal Range: Negative) Spec. Gravity: 1.025   (Normal Range: 1.003-1.035) Blood: trace-intact   (Normal Range: Negative) pH: 5.5   (Normal Range: 5.0-8.0) Protein: 30   (Normal Range: Negative) Urobilinogen: 0.2   (Normal Range: 0-1) Nitrite: negative   (Normal Range: Negative) Leukocyte Esterace: trace   (Normal Range: Negative)     Blood Tests   Date/Time Received: March 24, 2010 9:58 AM  Date/Time Reported: Burke Keels  March 24, 2010 9:58 AM   HGBA1C: 8.3%   (Normal Range: Non-Diabetic - 3-6%   Control Diabetic - 6-8%) CBG Fasting:: 177mg /dL     Appended Document: f/u ED 12/12, hi cbg, reaction to med/pcp-golding/hla   Tetanus/Td Vaccine    Vaccine Type: Tdap    Site: right deltoid    Mfr: [email or web address]    Dose: 0.5 ml    Route: IM    Given by: Chinita Pester RN    Exp. Date: 01/29/2012    Lot #: [id number]    VIS given: 02/27/08 version given March 24, 2010.  Pt. did not want flu/pneumonia vaccines.   Chinita Pester RN  March 24, 2010 11:01 AM

## 2010-05-13 NOTE — Progress Notes (Signed)
Summary: phone/gg  Phone Note Call from Patient   Caller: Patient Summary of Call: Pt called and states she has been going to [facility] and needs a rx for a bedside commode.  She is having a problem because her toilet seat is to low. This was recommedned by PT.  Needs a Rx for medicare to pay for this. Initial call taken by: Merrie Roof RN,  April 08, 2010 12:30 PM  Follow-up for Phone Call        will put in script Follow-up by: Julaine Fusi  DO,  April 08, 2010 2:02 PM  Additional Follow-up for Phone Call Additional follow up Details #1::        Pt will come by to get Rx Additional Follow-up by: Merrie Roof RN,  April 08, 2010 2:51 PM    New/Updated Medications: COMMODE BEDSIDE  MISC (MISC. DEVICES) Dx: Knee Arthritis-advanced Prescriptions: COMMODE BEDSIDE  MISC (MISC. DEVICES) Dx: Knee Arthritis-advanced  #1 x 0   Entered and Authorized by:   Julaine Fusi  DO   Signed by:   Julaine Fusi  DO on 04/08/2010   Method used:   Reprint   RxID:   [id number] COMMODE BEDSIDE  MISC (MISC. DEVICES) Dx: Knee Arthritis-advanced  #1 x 0   Entered and Authorized by:   Julaine Fusi  DO   Signed by:   Julaine Fusi  DO on 04/08/2010   Method used:   Telephoned to ...       [facility] [address] [phone]* (retail)       [address]       [address]       [place], [place]  [address]       Ph: [phone]       Fax: [phone]   RxID:   [id number]

## 2010-05-13 NOTE — Progress Notes (Signed)
  Phone Note Call from Patient   Caller: Patient Reason for Call: Acute Illness Summary of Call: Patient call complaining of itching and hives on her arms tonight after taking her meds; patient also repots that the night before she experience the same reaction, but also throat swelling and mild difficulty breathing, she came to ED for those symptoms and received benadryl, prednisone and pepcid. Patient already has an appointment tomorrow morning, she was advised to take her benadryl and to come to ED if she has any breathing problem for further evaluation and admission if needed.

## 2010-05-13 NOTE — Assessment & Plan Note (Signed)
Summary: LT KNEE INJECTION,MC   Primary Provider:  Julaine Fusi  DO   History of Present Illness: 50 yo F with severe b/l knee DJD here for CSI.  Fell 3 weeks ago, Lt knee much worse now.  Pain with walking.  Saw [facility] 2 weeks ago or so, had MRI that showed chondral loose body and severe OA.  Last Lt knee CSI 3.5 months ago.  Would like Rt knee done as well. usually gets 2-3 months of relief from injections. Knows she needs TKAs at some point, but needs to lose weight first.  Allergies: 1)  ! Darvocet  Physical Exam  General:  morbidly obese Msk:  Lt knee: diffuse ttp, no erythema.  TTP worst medial joint line Rt knee: decreased ROM with ext (mild-mod flexion contracture).  + medial joint line ttp Neurologic:  alert & oriented X3.     Impression & Recommendations:  Problem # 1:  OSTEOARTHRITIS, KNEES, BILATERAL, SEVERE (ICD-715.96)  Needs TKAs, but unable to at this point 2/2 her weight.  Repeat CSIs done today b/l.  Low impact exercise as well.  F/u as needed  Consent obtained and verified. Sterile betadine prep. Furthur cleansed with alcohol. Topical analgesic spray: Ethyl chloride. Joint: B/l knees Approached in typical fashion with: anteromedial approach Completed without difficulty Meds: 1 cc 40 mg kenalog and 4 cc 1% lidocaine for both knees Needle: 21 G 1.5 inch Aftercare instructions and Red flags advised.  Orders: Joint Aspirate / Injection, Large (20610) Kenalog 10 mg inj (J3301)  Complete Medication List: 1)  Toprol Xl 200 Mg Tb24 (Metoprolol succinate) .... Take 1 tablet by mouth once a day 2)  Lotensin 40 Mg Tabs (Benazepril hcl) .Marland Kitchen.. 1 by mouth daily 3)  Glucophage 1000 Mg Tabs (Metformin hcl) .Marland Kitchen.. 1 by mouth two times a day 4)  Glucotrol 5 Mg Tabs (Glipizide) .... Take 1 tablet by mouth two times a day 5)  Lantus Solostar 100 Unit/ml Soln (Insulin glargine) .... Inject 50 units subcutaneously at bedtime 6)  Protonix 40 Mg Tbec (Pantoprazole sodium) ....  Take 1 tablet by mouth once a day 7)  Vitamin D3 1000 Unit Caps (Cholecalciferol) .... Take 1 tablet by mouth once a day 8)  Hydrochlorothiazide 25 Mg Tabs (Hydrochlorothiazide) .... Take 1 tablet by mouth every morning 9)  Gabapentin 800 Mg Tabs (Gabapentin) .... Take 1 tablet by mouth two times a day 10)  Colcrys 0.6 Mg Tabs (Colchicine) .... Take one tab by mouth two times a day for 5 days, then 1 tab daily. 11)  Bd Pen Needle Short U/f 31g X 8 Mm Misc (Insulin pen needle) .... Use as directed once daily with lantus solostar pen 12)  Senokot S 8.6-50 Mg Tabs (Sennosides-docusate sodium) .... Take 1 tablet by mouth two times a day as needed 13)  Chlordiazepoxide Hcl 10 Mg Caps (Chlordiazepoxide hcl) .... Tke one tab by mouth once daily for muscle spasm 14)  Accu-chek Softclix Lancets Misc (Lancets) .... Check blood sugar every morning before breakfast 15)  Accu-chek Aviva Strp (Glucose blood) .... Check your bllod sugar daily before breakfast 16)  Fluconazole 150 Mg Tabs (Fluconazole) .... Take one tablet by mouth   Orders Added: 1)  Est. Patient Level III [84132] 2)  Joint Aspirate / Injection, Large [20610] 3)  Kenalog 10 mg inj [J3301]

## 2010-05-13 NOTE — Progress Notes (Signed)
  Phone Note Call from Patient   Caller: Patient Call For: Julaine Fusi  DO Summary of Call: Call from pt said that she went to the ER for Kidney stones. Wants to know what else she should do.  Follow-up for Phone Call        see her urologist for follow up Follow-up by: Julaine Fusi  DO,  March 14, 2010 10:41 PM

## 2010-05-13 NOTE — Letter (Signed)
Summary: [facility] [place] [facility]  [facility] [place] [facility]   Imported By: Margie Billet 05/07/2010 16:48:40  _____________________________________________________________________  External Attachment:    Type:   Image     Comment:   External Document

## 2010-05-13 NOTE — Progress Notes (Signed)
Summary: RTC  Phone Note Outgoing Call   Call placed by: Angelina Ok RN,  April 12, 2010 9:57 AM Call placed to: Patient Summary of Call: RTC to pt message left thta the Clinics had returned her call from earlier this am. Angelina Ok RN  April 12, 2010 9:58 AM RTC to pt message left that Clinics had returned her call.  2nd call and return. Angelina Ok RN  April 12, 2010 11:02 AM Pt called said that she has aboil.  Wants an Antibiotic for.  Pt was given anappointment for tommorrow am. Angelina Ok RN  April 12, 2010 11:11 AM  Initial call taken by: Angelina Ok RN,  April 12, 2010 9:58 AM  Follow-up for Phone Call        will send script for Doxy Follow-up by: Julaine Fusi  DO,  April 21, 2010 3:50 PM    New/Updated Medications: DOXYCYCLINE HYCLATE 100 MG TABS (DOXYCYCLINE HYCLATE) Take 1 tablet by mouth two times a day Prescriptions: DOXYCYCLINE HYCLATE 100 MG TABS (DOXYCYCLINE HYCLATE) Take 1 tablet by mouth two times a day  #14 x 0   Entered and Authorized by:   Julaine Fusi  DO   Signed by:   Julaine Fusi  DO on 04/21/2010   Method used:   Electronically to        [facility] [address] [phone]* (retail)       [address]       [address]       [place], [place]  [address]       Ph: [phone]       Fax: [phone]   RxID:   [id number] DOXYCYCLINE HYCLATE 100 MG CAPS (DOXYCYCLINE HYCLATE) Take 1 tablet by mouth two times a day  #14 x 0   Entered and Authorized by:   Julaine Fusi  DO   Signed by:   Julaine Fusi  DO on 04/21/2010   Method used:   Electronically to        [facility] [address] [phone]* (retail)       [address]       [address]       [place], [place]  [address]       Ph: [phone]       Fax: [phone]   RxID:   [id number]

## 2010-05-14 NOTE — Medication Information (Signed)
Summary: Tax adviser   Imported By: Florinda Marker 12/31/2008 15:49:14  _____________________________________________________________________  External Attachment:    Type:   Image     Comment:   External Document

## 2010-05-14 NOTE — Miscellaneous (Signed)
Summary: HIPAA Restrictions  HIPAA Restrictions   Imported By: Florinda Marker 02/27/2008 14:30:55  _____________________________________________________________________  External Attachment:    Type:   Image     Comment:   External Document

## 2010-05-16 ENCOUNTER — Encounter: Payer: Self-pay | Admitting: Internal Medicine

## 2010-05-17 ENCOUNTER — Other Ambulatory Visit: Payer: Self-pay | Admitting: Internal Medicine

## 2010-05-27 NOTE — Miscellaneous (Signed)
Summary: phone note  Patient called and states that she ran out of Valium and cannot sleep. Feels anxious. Missed two of her appointments with a Psychiatrist at the [facility]. Reviewed patient's medication list ->Valium is not listed. Instructed to call [facility] in am and speak wtih Dr. Phillips Odor, her PCP.

## 2010-06-01 ENCOUNTER — Telehealth: Payer: Self-pay | Admitting: *Deleted

## 2010-06-01 ENCOUNTER — Encounter: Payer: Self-pay | Admitting: Internal Medicine

## 2010-06-01 ENCOUNTER — Other Ambulatory Visit: Payer: Self-pay | Admitting: Internal Medicine

## 2010-06-01 ENCOUNTER — Ambulatory Visit (INDEPENDENT_AMBULATORY_CARE_PROVIDER_SITE_OTHER): Payer: Medicare Other | Admitting: Internal Medicine

## 2010-06-01 DIAGNOSIS — E559 Vitamin D deficiency, unspecified: Secondary | ICD-10-CM

## 2010-06-01 DIAGNOSIS — F411 Generalized anxiety disorder: Secondary | ICD-10-CM

## 2010-06-01 DIAGNOSIS — K219 Gastro-esophageal reflux disease without esophagitis: Secondary | ICD-10-CM

## 2010-06-01 DIAGNOSIS — I10 Essential (primary) hypertension: Secondary | ICD-10-CM

## 2010-06-01 DIAGNOSIS — M109 Gout, unspecified: Secondary | ICD-10-CM

## 2010-06-01 DIAGNOSIS — E119 Type 2 diabetes mellitus without complications: Secondary | ICD-10-CM

## 2010-06-01 DIAGNOSIS — N898 Other specified noninflammatory disorders of vagina: Secondary | ICD-10-CM

## 2010-06-01 DIAGNOSIS — N949 Unspecified condition associated with female genital organs and menstrual cycle: Secondary | ICD-10-CM

## 2010-06-01 DIAGNOSIS — J3489 Other specified disorders of nose and nasal sinuses: Secondary | ICD-10-CM

## 2010-06-01 DIAGNOSIS — R0981 Nasal congestion: Secondary | ICD-10-CM

## 2010-06-01 DIAGNOSIS — M62838 Other muscle spasm: Secondary | ICD-10-CM

## 2010-06-01 LAB — GLUCOSE, CAPILLARY: Glucose-Capillary: 357 mg/dL — ABNORMAL HIGH (ref 70–99)

## 2010-06-01 MED ORDER — PSEUDOEPHEDRINE-GUAIFENESIN ER 120-1200 MG PO TB12: ORAL_TABLET | ORAL | Status: DC

## 2010-06-01 MED ORDER — METRONIDAZOLE 500 MG PO TABS: ORAL_TABLET | ORAL | Status: AC

## 2010-06-01 NOTE — Telephone Encounter (Signed)
Probably needs urgent care evaluation if febrile and symptoms last longer than a week.

## 2010-06-01 NOTE — Patient Instructions (Addendum)
Please make sure to take ALL your medications as prescribed. Your blood sugars are really high; you need to do a better job of checking your blood glucose levels so we can prevent the long term complications of diabetes. You need to lose weight; weight loss is the first step in getting healthy.  Please follow the [facility] recommendations for psychiatric care. Pls get the help you need as soon as you can. Pls call the clinic if you have any questions or concerns.

## 2010-06-01 NOTE — Telephone Encounter (Signed)
Opening came available at 2:15 today.  Will see pt then

## 2010-06-01 NOTE — Telephone Encounter (Signed)
Pt called with c/o cold and sinus pain.  Taking OTC meds for a week without relief. Now chest hurts with deep breathing.  She talked with resident last night and was  Told to call in and she may need antibiotics?? She had been sweating but that has stopped.  She thinks she may have a little fever. Pt # [id number]

## 2010-06-02 ENCOUNTER — Encounter: Payer: Self-pay | Admitting: Internal Medicine

## 2010-06-02 LAB — GC/CHLAMYDIA PROBE AMP, GENITAL
Chlamydia, DNA Probe: NEGATIVE
GC Probe Amp, Genital: NEGATIVE

## 2010-06-03 LAB — C. TRACHOMATIS / N. GONORRHOEAE, DNA PROBE
Candida species: NEGATIVE
Gardnerella vaginalis: NEGATIVE
Trichomonas vaginosis: NEGATIVE

## 2010-06-03 LAB — WET PREP BY MOLECULAR PROBE
Candida species: NEGATIVE
Gardnerella vaginalis: NEGATIVE
Trichomonas vaginosis: NEGATIVE

## 2010-06-03 MED ORDER — INSULIN GLARGINE 100 UNIT/ML ~~LOC~~ SOLN: SUBCUTANEOUS | Status: DC

## 2010-06-03 MED ORDER — INSULIN PEN NEEDLE 31G X 8 MM MISC: Status: DC

## 2010-06-03 MED ORDER — METFORMIN HCL 1000 MG PO TABS: 1000.0000 mg | ORAL_TABLET | Freq: Two times a day (BID) | ORAL | Status: DC

## 2010-06-03 MED ORDER — PANTOPRAZOLE SODIUM 40 MG PO TBEC: 40.0000 mg | DELAYED_RELEASE_TABLET | Freq: Every day | ORAL | Status: DC

## 2010-06-03 MED ORDER — CARISOPRODOL 350 MG PO TABS: 350.0000 mg | ORAL_TABLET | Freq: Three times a day (TID) | ORAL | Status: DC | PRN

## 2010-06-03 MED ORDER — CHLORDIAZEPOXIDE HCL 10 MG PO CAPS: ORAL_CAPSULE | ORAL | Status: DC

## 2010-06-03 MED ORDER — HYDROCHLOROTHIAZIDE 25 MG PO TABS: 25.0000 mg | ORAL_TABLET | Freq: Every day | ORAL | Status: DC

## 2010-06-03 MED ORDER — GABAPENTIN 800 MG PO TABS: 800.0000 mg | ORAL_TABLET | Freq: Two times a day (BID) | ORAL | Status: DC

## 2010-06-03 MED ORDER — BENAZEPRIL HCL 40 MG PO TABS: 40.0000 mg | ORAL_TABLET | Freq: Every day | ORAL | Status: DC

## 2010-06-03 MED ORDER — CHOLECALCIFEROL 25 MCG (1000 UT) PO CAPS: 1000.0000 [IU] | ORAL_CAPSULE | Freq: Every day | ORAL | Status: AC

## 2010-06-03 MED ORDER — METOPROLOL SUCCINATE ER 200 MG PO TB24: 200.0000 mg | ORAL_TABLET | Freq: Every day | ORAL | Status: DC

## 2010-06-03 MED ORDER — COLCHICINE 0.6 MG PO TABS: 0.6000 mg | ORAL_TABLET | Freq: Two times a day (BID) | ORAL | Status: DC

## 2010-06-03 MED ORDER — GLIPIZIDE 10 MG PO TABS: 5.0000 mg | ORAL_TABLET | Freq: Two times a day (BID) | ORAL | Status: DC

## 2010-06-03 NOTE — Progress Notes (Signed)
  Subjective:    Patient ID: Maria [name], female    DOB: 05/05/1960, 50 y.o.   MRN: [id number]  Sinusitis Pertinent negatives include no coughing or shortness of breath.    Patient is a 50 year old woman with morbid obesity type 2 diabetes hyperlipidemia and hypertension who is presenting today with a one-week history of nasal congestion,  nonproductive coughing now resolved and subjective fever.  However at this visit she is currently afebrile she denies having any headaches sinus or facial pain chills sweats chest pain shortness of breath or rhinorrhea.  She's tried over-the-counter nasal decongestants and has found moderate relief of symptoms.  Patient also reports a one-month history of increased vaginal odor. Patient reports onset after having unprotected sex with her boyfriend who is now incarcerated.  She denies having any vaginal discharge, abdominal discomfort or dysuria.  However she noticed some increased itching for which she self medicated with Diflucan.  Patient reports she is under a significant amount of emotional stress as this past weekend marked the anniversary of the passing of her son who was in the [employer].  The patient had previously been followed [facility] the [facility] for management of her depression and anxiety however states to me today that she's been kicked out of the practice due to missing too many appointments. Patient had been previously prescribed Valium for her anxiety at behavioral health and today is requesting a prescription for the Valium. She is also requesting a prescription for Soma, which is a medication she has been taking for muscle spasms and has been previously prescribed at sports medicine.   Of note patient's name was run through the [employer] and it appears that the patient has been doctor shopping and she has been getting large amounts of narcotic prescriptions as well as benzodiazepines from several physicians throughout  the state. At this point we will no longer be prescribing her narcotics or antipsychotics or any psychiatric related medications to this patient. She's been advised to follow up at the behavioral center where recommendations could be made in terms or referral for her mental health care.  Review of Systems  Constitutional: Negative for fever.  Eyes: Negative for pain.  Respiratory: Negative for cough and shortness of breath.   Cardiovascular: Negative for chest pain.  Gastrointestinal: Negative for nausea and vomiting.  Genitourinary: Negative for dysuria and vaginal discharge.       Objective:   Physical Exam  Constitutional: She is oriented to person, place, and time. She appears well-developed and well-nourished. No distress.  HENT:  Nose: No mucosal edema, rhinorrhea or sinus tenderness. Right sinus exhibits no maxillary sinus tenderness and no frontal sinus tenderness. Left sinus exhibits no maxillary sinus tenderness and no frontal sinus tenderness.  Cardiovascular: Normal rate, regular rhythm and normal heart sounds.  Exam reveals no gallop and no friction rub.   No murmur heard. Pulmonary/Chest: Effort normal and breath sounds normal. No respiratory distress. She has no wheezes. She has no rales.  Abdominal: Soft. Bowel sounds are normal. There is no tenderness.  Genitourinary: Vagina normal.       Small amount of thin whitish discharge at the cervical os. No friability noted. No cervical motion tenderness.  Musculoskeletal: Normal range of motion.  Neurological: She is alert and oriented to person, place, and time.  Psychiatric: She has a normal mood and affect.          Assessment & Plan:

## 2010-06-03 NOTE — Assessment & Plan Note (Addendum)
Patient reports a one-week history of nasal congestion as well as nonproductive coughing. She did note subjective fevers at some point during this period.  She's tried over-the-counter nasal decongestants without relief.  Today she does not have any facial pain, headache, no fever and is hemodynamically stable.  However she continues to have nasal congestion.  Will treat with Mucinex D and patient is encouraged to use over-the-counter nasal saline sprays for further symptomatic relief.  Instructed to call the clinic or return to clinic if symptoms continue to worsen.

## 2010-06-03 NOTE — Assessment & Plan Note (Signed)
Patient states to me that she had unprotected sex with her boyfriend who is now incarcerated about a month ago.  Since then she has noted increased vaginal odor however no discharge. She also noted some itching for which she took fluconazole which she had at home.  She's had a vaginosis in the past for which she's been treated with Flagyl. For today -  Wet prep, GC and chlamydia culture -  Empiric treatment with Flagyl

## 2010-06-03 NOTE — Assessment & Plan Note (Signed)
The [place] controlled substances database revealed that the patient has been getting prescriptions for both narcotics and benzodiazepines from multiple physicians in the state. As a result, we are not comfortable prescribing this patient any controlled substances going forward. We feel that the patient at this stage will benefit from an inpatient rehab/detox program. Patient was counseled to follow up at [facility] as they will make further recommendations for follow up and make the necessary referrals that will adequately cater to her psychiatric needs. This was thoroughly discussed with the patient and she is in complete agreement with our plan.

## 2010-06-05 ENCOUNTER — Telehealth: Payer: Self-pay | Admitting: Internal Medicine

## 2010-06-05 ENCOUNTER — Encounter: Payer: Self-pay | Admitting: Internal Medicine

## 2010-06-05 DIAGNOSIS — M62838 Other muscle spasm: Secondary | ICD-10-CM

## 2010-06-05 MED ORDER — CARISOPRODOL 350 MG PO TABS: 350.0000 mg | ORAL_TABLET | Freq: Three times a day (TID) | ORAL | Status: AC | PRN

## 2010-06-05 NOTE — Telephone Encounter (Signed)
Patient called. States that ran out of Tresa Garter --was unable to reach her [facility]o at [facility]. Patient states that she has seen Dr. Narda Bonds on 06/03/2010 and WAS NOT given a Rx for soma. Will call in Soma 350 mg PO bid PRN #[id number], with no refills. Will also route a flag to Dr. Narda Bonds.

## 2010-06-08 ENCOUNTER — Other Ambulatory Visit: Payer: Self-pay | Admitting: Internal Medicine

## 2010-06-08 NOTE — Miscellaneous (Signed)
Summary: Rx refill  Clinical Lists Changes  Medications: Rx of CARISOPRODOL 350 MG TABS (CARISOPRODOL) Take 1 by mouth three times a day;  #30 x 0;  Signed;  Entered by: Deatra Robinson MD;  Authorized by: Deatra Robinson MD;  Method used: Electronically to [facility] [address] [phone]*, [address], [address], [place], [place]  [address], Ph: [phone], Fax: [phone]    Prescriptions: CARISOPRODOL 350 MG TABS (CARISOPRODOL) Take 1 by mouth three times a day  #30 x 0   Entered and Authorized by:   Deatra Robinson MD   Signed by:   Deatra Robinson MD on 06/05/2010   Method used:   Electronically to        [facility] [address] [phone]* (retail)       [address]       [address]       [place], [place]  [address]       Ph: [phone]       Fax: [phone]   RxID:   [id number]

## 2010-06-14 ENCOUNTER — Ambulatory Visit: Payer: Medicare Other | Admitting: Family Medicine

## 2010-06-17 ENCOUNTER — Other Ambulatory Visit: Payer: Self-pay | Admitting: *Deleted

## 2010-06-17 NOTE — Telephone Encounter (Signed)
Ordered by Dr. Denton Meek last filled on 06/05/2010.

## 2010-06-18 NOTE — Telephone Encounter (Signed)
Refill request denial sent to pharmacy.  Dr. Jennette Kettle to be contacted through pharmacy.

## 2010-06-21 LAB — CBC
HCT: 44.9 % (ref 36.0–46.0)
Hemoglobin: 14.8 g/dL (ref 12.0–15.0)
MCH: 28.9 pg (ref 26.0–34.0)
MCHC: 33 g/dL (ref 30.0–36.0)
MCV: 87.7 fL (ref 78.0–100.0)
Platelets: 363 10*3/uL (ref 150–400)
RBC: 5.12 MIL/uL — ABNORMAL HIGH (ref 3.87–5.11)
RDW: 13 % (ref 11.5–15.5)
WBC: 13.4 10*3/uL — ABNORMAL HIGH (ref 4.0–10.5)

## 2010-06-21 LAB — DIFFERENTIAL
Basophils Absolute: 0 10*3/uL (ref 0.0–0.1)
Basophils Relative: 0 % (ref 0–1)
Eosinophils Absolute: 0 10*3/uL (ref 0.0–0.7)
Eosinophils Relative: 0 % (ref 0–5)
Lymphocytes Relative: 36 % (ref 12–46)
Lymphs Abs: 4.7 10*3/uL — ABNORMAL HIGH (ref 0.7–4.0)
Monocytes Absolute: 0.6 10*3/uL (ref 0.1–1.0)
Monocytes Relative: 5 % (ref 3–12)
Neutro Abs: 8 10*3/uL — ABNORMAL HIGH (ref 1.7–7.7)
Neutrophils Relative %: 60 % (ref 43–77)

## 2010-06-21 LAB — GLUCOSE, CAPILLARY: Glucose-Capillary: 177 mg/dL — ABNORMAL HIGH (ref 70–99)

## 2010-06-21 LAB — BASIC METABOLIC PANEL
BUN: 19 mg/dL (ref 6–23)
CO2: 20 mEq/L (ref 19–32)
Calcium: 9.3 mg/dL (ref 8.4–10.5)
Chloride: 102 mEq/L (ref 96–112)
Creatinine, Ser: 1.04 mg/dL (ref 0.4–1.2)
GFR calc Af Amer: >60 mL/min (ref >60)
GFR calc non Af Amer: 56 mL/min — ABNORMAL LOW (ref >60)
Glucose, Bld: 317 mg/dL — ABNORMAL HIGH (ref 70–99)
Potassium: 3.6 mEq/L (ref 3.5–5.1)
Sodium: 136 mEq/L (ref 135–145)

## 2010-06-22 ENCOUNTER — Telehealth: Payer: Self-pay | Admitting: *Deleted

## 2010-06-22 ENCOUNTER — Other Ambulatory Visit: Payer: Self-pay | Admitting: *Deleted

## 2010-06-22 DIAGNOSIS — I10 Essential (primary) hypertension: Secondary | ICD-10-CM

## 2010-06-22 NOTE — Telephone Encounter (Signed)
Discussed with Venita Sheffield. Metoprolol from 4$ formulary.100mg  PO BID called in for one month only.

## 2010-06-22 NOTE — Telephone Encounter (Signed)
One month of metoprolol called into [facility] for pt.

## 2010-06-22 NOTE — Telephone Encounter (Signed)
Pt is unable to afford the Toprol XR said that it costs $56.00.  Spoke with Dr. Phillips Odor  Changed to Metoprolol 100 mg 1 po BID dispence # 60 with no refills per order of Dr. Phillips Odor.

## 2010-06-22 NOTE — Telephone Encounter (Signed)
Metoprolol is 4 dollars at [facility] may call in for one month.

## 2010-06-22 NOTE — Telephone Encounter (Signed)
Pt called stating she lost her refill of metoprolol and can get it refilled BUT insurance will not pay for it.  She wants to change to something else for 1 month, something that will cost less than $61. Pt # [phone]

## 2010-06-23 LAB — DIFFERENTIAL
Basophils Absolute: 0.2 10*3/uL — ABNORMAL HIGH (ref 0.0–0.1)
Basophils Relative: 2 % — ABNORMAL HIGH (ref 0–1)
Eosinophils Absolute: 0.1 10*3/uL (ref 0.0–0.7)
Eosinophils Relative: 1 % (ref 0–5)
Lymphocytes Relative: 33 % (ref 12–46)
Lymphs Abs: 3.7 10*3/uL (ref 0.7–4.0)
Monocytes Absolute: 0.4 10*3/uL (ref 0.1–1.0)
Monocytes Relative: 4 % (ref 3–12)
Neutro Abs: 6.6 10*3/uL (ref 1.7–7.7)
Neutrophils Relative %: 60 % (ref 43–77)

## 2010-06-23 LAB — URINALYSIS, ROUTINE W REFLEX MICROSCOPIC
Bilirubin Urine: NEGATIVE
Glucose, UA: NEGATIVE mg/dL
Nitrite: NEGATIVE
Protein, ur: 30 mg/dL — AB
Specific Gravity, Urine: 1.023 (ref 1.005–1.030)
Urobilinogen, UA: 0.2 mg/dL (ref 0.0–1.0)
pH: 6 (ref 5.0–8.0)

## 2010-06-23 LAB — URINE CULTURE
Colony Count: 55000
Culture  Setup Time: 201111110335

## 2010-06-23 LAB — COMPREHENSIVE METABOLIC PANEL
ALT: 13 U/L (ref 0–35)
AST: 17 U/L (ref 0–37)
Albumin: 3.3 g/dL — ABNORMAL LOW (ref 3.5–5.2)
Alkaline Phosphatase: 79 U/L (ref 39–117)
BUN: 17 mg/dL (ref 6–23)
CO2: 25 mEq/L (ref 19–32)
Calcium: 9.4 mg/dL (ref 8.4–10.5)
Chloride: 100 mEq/L (ref 96–112)
Creatinine, Ser: 0.99 mg/dL (ref 0.4–1.2)
GFR calc Af Amer: >60 mL/min (ref >60)
GFR calc non Af Amer: 59 mL/min — ABNORMAL LOW (ref >60)
Glucose, Bld: 275 mg/dL — ABNORMAL HIGH (ref 70–99)
Potassium: 4.2 mEq/L (ref 3.5–5.1)
Sodium: 139 mEq/L (ref 135–145)
Total Bilirubin: 0.4 mg/dL (ref 0.3–1.2)
Total Protein: 7.4 g/dL (ref 6.0–8.3)

## 2010-06-23 LAB — CBC
HCT: 37.9 % (ref 36.0–46.0)
Hemoglobin: 12.7 g/dL (ref 12.0–15.0)
MCH: 29.7 pg (ref 26.0–34.0)
MCHC: 33.5 g/dL (ref 30.0–36.0)
MCV: 88.8 fL (ref 78.0–100.0)
Platelets: 357 10*3/uL (ref 150–400)
RBC: 4.27 MIL/uL (ref 3.87–5.11)
RDW: 13.4 % (ref 11.5–15.5)
WBC: 11 10*3/uL — ABNORMAL HIGH (ref 4.0–10.5)

## 2010-06-23 LAB — URINE MICROSCOPIC-ADD ON

## 2010-06-23 LAB — GLUCOSE, CAPILLARY: Glucose-Capillary: 97 mg/dL (ref 70–99)

## 2010-06-23 LAB — LIPASE, BLOOD: Lipase: 27 U/L (ref 11–59)

## 2010-06-25 LAB — COMPREHENSIVE METABOLIC PANEL
ALT: 18 U/L (ref 0–35)
AST: 12 U/L (ref 0–37)
Albumin: 3.4 g/dL — ABNORMAL LOW (ref 3.5–5.2)
Alkaline Phosphatase: 77 U/L (ref 39–117)
BUN: 8 mg/dL (ref 6–23)
CO2: 26 mEq/L (ref 19–32)
Calcium: 8.9 mg/dL (ref 8.4–10.5)
Chloride: 106 mEq/L (ref 96–112)
Creatinine, Ser: 0.86 mg/dL (ref 0.4–1.2)
GFR calc Af Amer: >60 mL/min (ref >60)
GFR calc non Af Amer: >60 mL/min (ref >60)
Glucose, Bld: 178 mg/dL — ABNORMAL HIGH (ref 70–99)
Potassium: 3.7 mEq/L (ref 3.5–5.1)
Sodium: 138 mEq/L (ref 135–145)
Total Bilirubin: 0.7 mg/dL (ref 0.3–1.2)
Total Protein: 7.3 g/dL (ref 6.0–8.3)

## 2010-06-25 LAB — CBC
HCT: 36.7 % (ref 36.0–46.0)
Hemoglobin: 12.3 g/dL (ref 12.0–15.0)
MCH: 29.8 pg (ref 26.0–34.0)
MCHC: 33.6 g/dL (ref 30.0–36.0)
MCV: 88.8 fL (ref 78.0–100.0)
Platelets: 320 10*3/uL (ref 150–400)
RBC: 4.13 MIL/uL (ref 3.87–5.11)
RDW: 13.9 % (ref 11.5–15.5)
WBC: 8.9 10*3/uL (ref 4.0–10.5)

## 2010-06-25 LAB — DIFFERENTIAL
Basophils Absolute: 0.3 10*3/uL — ABNORMAL HIGH (ref 0.0–0.1)
Basophils Relative: 3 % — ABNORMAL HIGH (ref 0–1)
Eosinophils Absolute: 0.1 10*3/uL (ref 0.0–0.7)
Eosinophils Relative: 1 % (ref 0–5)
Lymphocytes Relative: 34 % (ref 12–46)
Lymphs Abs: 3 10*3/uL (ref 0.7–4.0)
Monocytes Absolute: 0.5 10*3/uL (ref 0.1–1.0)
Monocytes Relative: 5 % (ref 3–12)
Neutro Abs: 5 10*3/uL (ref 1.7–7.7)
Neutrophils Relative %: 56 % (ref 43–77)

## 2010-06-25 LAB — URINALYSIS, ROUTINE W REFLEX MICROSCOPIC
Bilirubin Urine: NEGATIVE
Glucose, UA: NEGATIVE mg/dL
Hgb urine dipstick: NEGATIVE
Ketones, ur: NEGATIVE mg/dL
Nitrite: NEGATIVE
Protein, ur: NEGATIVE mg/dL
Specific Gravity, Urine: 1.024 (ref 1.005–1.030)
Urobilinogen, UA: 1 mg/dL (ref 0.0–1.0)
pH: 5.5 (ref 5.0–8.0)

## 2010-06-25 LAB — WET PREP, GENITAL
Clue Cells Wet Prep HPF POC: NONE SEEN
Trich, Wet Prep: NONE SEEN
WBC, Wet Prep HPF POC: NONE SEEN
Yeast Wet Prep HPF POC: NONE SEEN

## 2010-06-25 LAB — GC/CHLAMYDIA PROBE AMP, GENITAL
Chlamydia, DNA Probe: NEGATIVE
GC Probe Amp, Genital: NEGATIVE

## 2010-06-25 LAB — PREGNANCY, URINE: Preg Test, Ur: NEGATIVE

## 2010-06-26 LAB — POCT I-STAT, CHEM 8
BUN: 22 mg/dL (ref 6–23)
Calcium, Ion: 1.16 mmol/L (ref 1.12–1.32)
Chloride: 102 mEq/L (ref 96–112)
Creatinine, Ser: 0.9 mg/dL (ref 0.4–1.2)
Glucose, Bld: 249 mg/dL — ABNORMAL HIGH (ref 70–99)
HCT: 41 % (ref 36.0–46.0)
Hemoglobin: 13.9 g/dL (ref 12.0–15.0)
Potassium: 3.8 mEq/L (ref 3.5–5.1)
Sodium: 139 mEq/L (ref 135–145)
TCO2: 31 mmol/L (ref 0–100)

## 2010-06-26 LAB — GLUCOSE, CAPILLARY: Glucose-Capillary: 152 mg/dL — ABNORMAL HIGH (ref 70–99)

## 2010-06-26 LAB — URINALYSIS, ROUTINE W REFLEX MICROSCOPIC
Bilirubin Urine: NEGATIVE
Glucose, UA: NEGATIVE mg/dL
Ketones, ur: NEGATIVE mg/dL
Nitrite: NEGATIVE
Protein, ur: NEGATIVE mg/dL
Specific Gravity, Urine: 1.025 (ref 1.005–1.030)
Urobilinogen, UA: 0.2 mg/dL (ref 0.0–1.0)
pH: 5.5 (ref 5.0–8.0)

## 2010-06-26 LAB — URINE MICROSCOPIC-ADD ON

## 2010-06-26 LAB — POCT PREGNANCY, URINE: Preg Test, Ur: NEGATIVE

## 2010-06-27 LAB — GLUCOSE, CAPILLARY: Glucose-Capillary: 176 mg/dL — ABNORMAL HIGH (ref 70–99)

## 2010-06-28 ENCOUNTER — Ambulatory Visit: Payer: Medicare Other | Admitting: Family Medicine

## 2010-06-29 ENCOUNTER — Emergency Department (HOSPITAL_COMMUNITY)
Admission: EM | Admit: 2010-06-29 | Discharge: 2010-06-29 | Disposition: A | Discharge status: Home / Self Care | Payer: Medicare Other | Attending: Emergency Medicine | Admitting: Emergency Medicine

## 2010-06-29 DIAGNOSIS — Z79899 Other long term (current) drug therapy: Secondary | ICD-10-CM | POA: Insufficient documentation

## 2010-06-29 DIAGNOSIS — E119 Type 2 diabetes mellitus without complications: Secondary | ICD-10-CM | POA: Insufficient documentation

## 2010-06-29 DIAGNOSIS — R109 Unspecified abdominal pain: Secondary | ICD-10-CM | POA: Insufficient documentation

## 2010-06-29 DIAGNOSIS — R112 Nausea with vomiting, unspecified: Secondary | ICD-10-CM | POA: Insufficient documentation

## 2010-06-29 DIAGNOSIS — N200 Calculus of kidney: Secondary | ICD-10-CM | POA: Insufficient documentation

## 2010-06-29 DIAGNOSIS — F341 Dysthymic disorder: Secondary | ICD-10-CM | POA: Insufficient documentation

## 2010-06-29 DIAGNOSIS — K219 Gastro-esophageal reflux disease without esophagitis: Secondary | ICD-10-CM | POA: Insufficient documentation

## 2010-06-29 DIAGNOSIS — Z794 Long term (current) use of insulin: Secondary | ICD-10-CM | POA: Insufficient documentation

## 2010-06-29 DIAGNOSIS — I10 Essential (primary) hypertension: Secondary | ICD-10-CM | POA: Insufficient documentation

## 2010-06-29 LAB — POCT I-STAT, CHEM 8
BUN: 15 mg/dL (ref 6–23)
Calcium, Ion: 1.06 mmol/L — ABNORMAL LOW (ref 1.12–1.32)
Chloride: 99 mEq/L (ref 96–112)
Creatinine, Ser: 0.9 mg/dL (ref 0.4–1.2)
Glucose, Bld: 281 mg/dL — ABNORMAL HIGH (ref 70–99)
HCT: 40 % (ref 36.0–46.0)
Hemoglobin: 13.6 g/dL (ref 12.0–15.0)
Potassium: 3.7 mEq/L (ref 3.5–5.1)
Sodium: 138 mEq/L (ref 135–145)
TCO2: 27 mmol/L (ref 0–100)

## 2010-06-29 LAB — GLUCOSE, CAPILLARY: Glucose-Capillary: 161 mg/dL — ABNORMAL HIGH (ref 70–99)

## 2010-06-29 LAB — URINALYSIS, ROUTINE W REFLEX MICROSCOPIC
Bilirubin Urine: NEGATIVE
Glucose, UA: 100 mg/dL — AB
Ketones, ur: 15 mg/dL — AB
Nitrite: NEGATIVE
Protein, ur: 30 mg/dL — AB
Specific Gravity, Urine: 1.025 (ref 1.005–1.030)
Urobilinogen, UA: 0.2 mg/dL (ref 0.0–1.0)
pH: 6 (ref 5.0–8.0)

## 2010-06-29 LAB — URINE MICROSCOPIC-ADD ON

## 2010-07-02 LAB — POCT RAPID STREP A (OFFICE): Streptococcus, Group A Screen (Direct): NEGATIVE

## 2010-07-02 LAB — GLUCOSE, CAPILLARY: Glucose-Capillary: 135 mg/dL — ABNORMAL HIGH (ref 70–99)

## 2010-07-13 LAB — URINALYSIS, ROUTINE W REFLEX MICROSCOPIC
Bilirubin Urine: NEGATIVE
Glucose, UA: NEGATIVE mg/dL
Ketones, ur: 15 mg/dL — AB
Leukocytes, UA: NEGATIVE
Nitrite: NEGATIVE
Protein, ur: NEGATIVE mg/dL
Specific Gravity, Urine: 1.025 (ref 1.005–1.030)
Urobilinogen, UA: 0.2 mg/dL (ref 0.0–1.0)
pH: 5.5 (ref 5.0–8.0)

## 2010-07-13 LAB — WET PREP, GENITAL
Clue Cells Wet Prep HPF POC: NONE SEEN
Trich, Wet Prep: NONE SEEN
Yeast Wet Prep HPF POC: NONE SEEN

## 2010-07-13 LAB — URINE MICROSCOPIC-ADD ON

## 2010-07-13 LAB — CBC
HCT: 38.4 % (ref 36.0–46.0)
Hemoglobin: 12.6 g/dL (ref 12.0–15.0)
MCHC: 32.9 g/dL (ref 30.0–36.0)
MCV: 90.5 fL (ref 78.0–100.0)
Platelets: 334 10*3/uL (ref 150–400)
RBC: 4.24 MIL/uL (ref 3.87–5.11)
RDW: 13.8 % (ref 11.5–15.5)
WBC: 7.9 10*3/uL (ref 4.0–10.5)

## 2010-07-13 LAB — GC/CHLAMYDIA PROBE AMP, GENITAL
Chlamydia, DNA Probe: NEGATIVE
GC Probe Amp, Genital: NEGATIVE

## 2010-07-13 LAB — POCT PREGNANCY, URINE: Preg Test, Ur: NEGATIVE

## 2010-07-14 ENCOUNTER — Ambulatory Visit: Payer: Medicare Other | Admitting: Internal Medicine

## 2010-07-15 ENCOUNTER — Encounter: Payer: Self-pay | Admitting: Internal Medicine

## 2010-07-15 ENCOUNTER — Ambulatory Visit (INDEPENDENT_AMBULATORY_CARE_PROVIDER_SITE_OTHER): Payer: Medicare Other | Admitting: Internal Medicine

## 2010-07-15 VITALS — BP 140/88 | HR 81 | Temp 97.7°F | Ht 68.0 in | Wt 365.6 lb

## 2010-07-15 DIAGNOSIS — Z0000 Encounter for general adult medical examination without abnormal findings: Secondary | ICD-10-CM

## 2010-07-15 DIAGNOSIS — Z01818 Encounter for other preprocedural examination: Secondary | ICD-10-CM | POA: Insufficient documentation

## 2010-07-15 DIAGNOSIS — Z139 Encounter for screening, unspecified: Secondary | ICD-10-CM

## 2010-07-15 DIAGNOSIS — E119 Type 2 diabetes mellitus without complications: Secondary | ICD-10-CM

## 2010-07-15 LAB — GLUCOSE, CAPILLARY
Glucose-Capillary: 97 mg/dL (ref 70–99)
Glucose-Capillary: 244 mg/dL — ABNORMAL HIGH (ref 70–99)

## 2010-07-15 LAB — POCT GLYCOSYLATED HEMOGLOBIN (HGB A1C): Hemoglobin A1C: 8.8

## 2010-07-15 MED ORDER — INSULIN GLARGINE 100 UNIT/ML ~~LOC~~ SOLN: SUBCUTANEOUS | Status: DC

## 2010-07-15 NOTE — Assessment & Plan Note (Signed)
Patient is going for bilateral knee replacement, before which is required to undergo clearance from her primary care physician's and the cardiologist. Patient is not history of coronary artery disease or pulmonary disease. I will order a chest x-ray for routine preop screening for the patient's cardiology for basic cardiac clearance which may include a 2-D echo versus stress test, along with any other tests as they see fit.

## 2010-07-15 NOTE — Progress Notes (Signed)
  Subjective:    Patient ID: Maria [name], female    DOB: Aug 18, 1960, 50 y.o.   MRN: [id number]  HPI  The patient is a 50 year old female with past medical history listed below, presents to the outpatient clinic for preoperative clearance exam. Patient denies any new complaints states she is in good health, has been compliant with all of her medication, denies any fevers chest pain, exertional angina, shortness of breath or palpitations.   Review of Systems  [all other systems reviewed and are negative       Objective:   Physical Exam  Constitutional: She is oriented to person, place, and time. She appears well-developed and well-nourished.  HENT:  Head: Normocephalic and atraumatic.  Eyes: EOM are normal. Pupils are equal, round, and reactive to light.  Neck: Normal range of motion. No JVD present. No thyromegaly present.  Cardiovascular: Normal rate, regular rhythm and normal heart sounds.  Exam reveals no gallop and no friction rub.   No murmur heard. Pulmonary/Chest: Effort normal and breath sounds normal. No respiratory distress. She has no wheezes. She has no rales. She exhibits no tenderness.  Musculoskeletal: Normal range of motion.  Neurological: She is alert and oriented to person, place, and time.  Skin: Skin is warm and dry.          Assessment & Plan:

## 2010-07-15 NOTE — Assessment & Plan Note (Signed)
Given the patient's A1c is 8.8, increase her Lantus to 60 units at bedtime and continue oral hyperglycemic agents.

## 2010-07-16 LAB — DIFFERENTIAL
Basophils Absolute: 0.2 10*3/uL — ABNORMAL HIGH (ref 0.0–0.1)
Basophils Relative: 2 % — ABNORMAL HIGH (ref 0–1)
Eosinophils Absolute: 0.1 10*3/uL (ref 0.0–0.7)
Eosinophils Relative: 1 % (ref 0–5)
Lymphocytes Relative: 35 % (ref 12–46)
Lymphs Abs: 3.1 10*3/uL (ref 0.7–4.0)
Monocytes Absolute: 0.4 10*3/uL (ref 0.1–1.0)
Monocytes Relative: 5 % (ref 3–12)
Neutro Abs: 5.1 10*3/uL (ref 1.7–7.7)
Neutrophils Relative %: 57 % (ref 43–77)

## 2010-07-16 LAB — URINALYSIS, ROUTINE W REFLEX MICROSCOPIC
Bilirubin Urine: NEGATIVE
Glucose, UA: NEGATIVE mg/dL
Glucose, UA: NEGATIVE mg/dL
Hgb urine dipstick: NEGATIVE
Ketones, ur: NEGATIVE mg/dL
Ketones, ur: NEGATIVE mg/dL
Leukocytes, UA: NEGATIVE
Nitrite: NEGATIVE
Nitrite: NEGATIVE
Protein, ur: NEGATIVE mg/dL
Protein, ur: NEGATIVE mg/dL
Specific Gravity, Urine: 1.027 (ref 1.005–1.030)
Specific Gravity, Urine: 1.018 (ref 1.005–1.030)
Urobilinogen, UA: 1 mg/dL (ref 0.0–1.0)
Urobilinogen, UA: 0.2 mg/dL (ref 0.0–1.0)
pH: 6 (ref 5.0–8.0)
pH: 5.5 (ref 5.0–8.0)

## 2010-07-16 LAB — PREGNANCY, URINE
Preg Test, Ur: NEGATIVE
Preg Test, Ur: NEGATIVE

## 2010-07-16 LAB — BASIC METABOLIC PANEL
BUN: 11 mg/dL (ref 6–23)
CO2: 30 mEq/L (ref 19–32)
Calcium: 9.5 mg/dL (ref 8.4–10.5)
Chloride: 103 mEq/L (ref 96–112)
Creatinine, Ser: 0.9 mg/dL (ref 0.4–1.2)
GFR calc Af Amer: >60 mL/min (ref >60)
GFR calc non Af Amer: >60 mL/min (ref >60)
Glucose, Bld: 190 mg/dL — ABNORMAL HIGH (ref 70–99)
Potassium: 4.4 mEq/L (ref 3.5–5.1)
Sodium: 142 mEq/L (ref 135–145)

## 2010-07-16 LAB — URINE CULTURE
Colony Count: 70000
Colony Count: NO GROWTH
Culture: NO GROWTH

## 2010-07-16 LAB — CBC
HCT: 37.2 % (ref 36.0–46.0)
Hemoglobin: 12.4 g/dL (ref 12.0–15.0)
MCHC: 33.4 g/dL (ref 30.0–36.0)
MCV: 90.2 fL (ref 78.0–100.0)
Platelets: 409 10*3/uL — ABNORMAL HIGH (ref 150–400)
RBC: 4.12 MIL/uL (ref 3.87–5.11)
RDW: 13 % (ref 11.5–15.5)
WBC: 8.9 10*3/uL (ref 4.0–10.5)

## 2010-07-16 LAB — GC/CHLAMYDIA PROBE AMP, GENITAL
Chlamydia, DNA Probe: NEGATIVE
GC Probe Amp, Genital: NEGATIVE

## 2010-07-16 LAB — URINE MICROSCOPIC-ADD ON

## 2010-07-16 LAB — WET PREP, GENITAL
Trich, Wet Prep: NONE SEEN
Yeast Wet Prep HPF POC: NONE SEEN

## 2010-07-17 LAB — GLUCOSE, CAPILLARY: Glucose-Capillary: 194 mg/dL — ABNORMAL HIGH (ref 70–99)

## 2010-07-18 LAB — GLUCOSE, CAPILLARY
Glucose-Capillary: 254 mg/dL — ABNORMAL HIGH (ref 70–99)
Glucose-Capillary: 126 mg/dL — ABNORMAL HIGH (ref 70–99)

## 2010-07-19 LAB — URINALYSIS, ROUTINE W REFLEX MICROSCOPIC
Bilirubin Urine: NEGATIVE
Glucose, UA: NEGATIVE mg/dL
Hgb urine dipstick: NEGATIVE
Ketones, ur: NEGATIVE mg/dL
Nitrite: NEGATIVE
Protein, ur: NEGATIVE mg/dL
Specific Gravity, Urine: 1.024 (ref 1.005–1.030)
Urobilinogen, UA: 0.2 mg/dL (ref 0.0–1.0)
pH: 5.5 (ref 5.0–8.0)

## 2010-07-19 LAB — POCT I-STAT, CHEM 8
BUN: 38 mg/dL — ABNORMAL HIGH (ref 6–23)
Calcium, Ion: 1.17 mmol/L (ref 1.12–1.32)
Chloride: 103 mEq/L (ref 96–112)
Creatinine, Ser: 1.2 mg/dL (ref 0.4–1.2)
Glucose, Bld: 179 mg/dL — ABNORMAL HIGH (ref 70–99)
HCT: 41 % (ref 36.0–46.0)
Hemoglobin: 13.9 g/dL (ref 12.0–15.0)
Potassium: 4.7 mEq/L (ref 3.5–5.1)
Sodium: 138 mEq/L (ref 135–145)
TCO2: 28 mmol/L (ref 0–100)

## 2010-07-19 LAB — CBC
HCT: 37.4 % (ref 36.0–46.0)
Hemoglobin: 12.6 g/dL (ref 12.0–15.0)
MCHC: 33.7 g/dL (ref 30.0–36.0)
MCV: 87.9 fL (ref 78.0–100.0)
Platelets: 317 10*3/uL (ref 150–400)
RBC: 4.25 MIL/uL (ref 3.87–5.11)
RDW: 13.3 % (ref 11.5–15.5)
WBC: 8.7 10*3/uL (ref 4.0–10.5)

## 2010-07-19 LAB — BASIC METABOLIC PANEL
BUN: 22 mg/dL (ref 6–23)
CO2: 25 mEq/L (ref 19–32)
Calcium: 9.6 mg/dL (ref 8.4–10.5)
Chloride: 101 mEq/L (ref 96–112)
Creatinine, Ser: 1.25 mg/dL — ABNORMAL HIGH (ref 0.4–1.2)
GFR calc Af Amer: 55 mL/min — ABNORMAL LOW (ref >60)
GFR calc non Af Amer: 46 mL/min — ABNORMAL LOW (ref >60)
Glucose, Bld: 213 mg/dL — ABNORMAL HIGH (ref 70–99)
Potassium: 4 mEq/L (ref 3.5–5.1)
Sodium: 138 mEq/L (ref 135–145)

## 2010-07-19 LAB — URINE MICROSCOPIC-ADD ON

## 2010-07-19 LAB — DIFFERENTIAL
Basophils Absolute: 0.1 10*3/uL (ref 0.0–0.1)
Basophils Relative: 1 % (ref 0–1)
Eosinophils Absolute: 0.1 10*3/uL (ref 0.0–0.7)
Eosinophils Relative: 1 % (ref 0–5)
Lymphocytes Relative: 36 % (ref 12–46)
Lymphs Abs: 3.1 10*3/uL (ref 0.7–4.0)
Monocytes Absolute: 0.3 10*3/uL (ref 0.1–1.0)
Monocytes Relative: 4 % (ref 3–12)
Neutro Abs: 5 10*3/uL (ref 1.7–7.7)
Neutrophils Relative %: 58 % (ref 43–77)

## 2010-07-19 LAB — URINE CULTURE
Colony Count: NO GROWTH
Culture: NO GROWTH

## 2010-07-19 LAB — GLUCOSE, CAPILLARY: Glucose-Capillary: 185 mg/dL — ABNORMAL HIGH (ref 70–99)

## 2010-07-19 LAB — POCT PREGNANCY, URINE: Preg Test, Ur: NEGATIVE

## 2010-07-19 NOTE — Progress Notes (Signed)
Pt aware of mammogram appt [facility] 07/21/10 2PM - order faxed. [facility]ney Jermaine Neuharth RN 07/19/10 1PM

## 2010-07-19 NOTE — Progress Notes (Signed)
Notes faxed to Dr Candie Echevaria att [facility]ter [phone] - pt aware for knee surgery. [facility] Gabreal Worton RN 07/19/10 10:30AM

## 2010-07-20 ENCOUNTER — Ambulatory Visit
Admission: RE | Admit: 2010-07-20 | Discharge: 2010-07-20 | Disposition: A | Discharge status: Home / Self Care | Payer: Medicare Other | Source: Ambulatory Visit | Attending: Internal Medicine | Admitting: Internal Medicine

## 2010-07-20 ENCOUNTER — Other Ambulatory Visit: Payer: Self-pay | Admitting: Internal Medicine

## 2010-07-20 DIAGNOSIS — Z0000 Encounter for general adult medical examination without abnormal findings: Secondary | ICD-10-CM

## 2010-07-20 LAB — URINE MICROSCOPIC-ADD ON

## 2010-07-20 LAB — URINALYSIS, ROUTINE W REFLEX MICROSCOPIC
Glucose, UA: NEGATIVE mg/dL
Hgb urine dipstick: NEGATIVE
Ketones, ur: NEGATIVE mg/dL
Nitrite: NEGATIVE
Protein, ur: NEGATIVE mg/dL
Specific Gravity, Urine: 1.022 (ref 1.005–1.030)
Urobilinogen, UA: 0.2 mg/dL (ref 0.0–1.0)
pH: 5.5 (ref 5.0–8.0)

## 2010-07-20 LAB — POCT I-STAT, CHEM 8
BUN: 14 mg/dL (ref 6–23)
Calcium, Ion: 1.1 mmol/L — ABNORMAL LOW (ref 1.12–1.32)
Chloride: 103 mEq/L (ref 96–112)
Creatinine, Ser: 1 mg/dL (ref 0.4–1.2)
Glucose, Bld: 214 mg/dL — ABNORMAL HIGH (ref 70–99)
HCT: 40 % (ref 36.0–46.0)
Hemoglobin: 13.6 g/dL (ref 12.0–15.0)
Potassium: 4.2 mEq/L (ref 3.5–5.1)
Sodium: 138 mEq/L (ref 135–145)
TCO2: 25 mmol/L (ref 0–100)

## 2010-07-20 LAB — CBC
HCT: 37.3 % (ref 36.0–46.0)
Hemoglobin: 12.4 g/dL (ref 12.0–15.0)
MCHC: 33.2 g/dL (ref 30.0–36.0)
MCV: 88 fL (ref 78.0–100.0)
Platelets: 346 10*3/uL (ref 150–400)
RBC: 4.23 MIL/uL (ref 3.87–5.11)
RDW: 13.2 % (ref 11.5–15.5)
WBC: 7.4 10*3/uL (ref 4.0–10.5)

## 2010-07-20 LAB — POCT PREGNANCY, URINE: Preg Test, Ur: NEGATIVE

## 2010-07-23 ENCOUNTER — Ambulatory Visit: Payer: Medicare Other | Admitting: Family Medicine

## 2010-07-23 ENCOUNTER — Telehealth (INDEPENDENT_AMBULATORY_CARE_PROVIDER_SITE_OTHER): Payer: Medicare Other | Admitting: *Deleted

## 2010-07-23 DIAGNOSIS — N898 Other specified noninflammatory disorders of vagina: Secondary | ICD-10-CM

## 2010-07-23 MED ORDER — METRONIDAZOLE 500 MG PO TABS: 500.0000 mg | ORAL_TABLET | Freq: Two times a day (BID) | ORAL | Status: AC

## 2010-07-23 NOTE — Telephone Encounter (Addendum)
Pt request a Rx for flagyl and diflucan. Pt states she lost the last  Rx in moving and had only taked 3 days of the Rx. Of flagyl. Now having small amount of  D/c.  Rx was written on 2/21 Pt saw Dr Gilford Rile on 4/5 and he would not give her a new Rx.  Will you write a Rx or will pt need to be seen?  Will re-write flagyl 500 mg PO BID for an additional 4 days as she has already completed 3 days worth.

## 2010-07-26 LAB — URINALYSIS, ROUTINE W REFLEX MICROSCOPIC
Glucose, UA: NEGATIVE mg/dL
Ketones, ur: 15 mg/dL — AB
Nitrite: NEGATIVE
Protein, ur: 100 mg/dL — AB
Specific Gravity, Urine: 1.033 — ABNORMAL HIGH (ref 1.005–1.030)
Urobilinogen, UA: 1 mg/dL (ref 0.0–1.0)
pH: 6 (ref 5.0–8.0)

## 2010-07-26 LAB — URINE CULTURE: Colony Count: 85000

## 2010-07-26 LAB — BASIC METABOLIC PANEL
BUN: 16 mg/dL (ref 6–23)
CO2: 27 mEq/L (ref 19–32)
Calcium: 9.3 mg/dL (ref 8.4–10.5)
Chloride: 102 mEq/L (ref 96–112)
Creatinine, Ser: 0.9 mg/dL (ref 0.4–1.2)
GFR calc Af Amer: >60 mL/min (ref >60)
GFR calc non Af Amer: >60 mL/min (ref >60)
Glucose, Bld: 191 mg/dL — ABNORMAL HIGH (ref 70–99)
Potassium: 3.7 mEq/L (ref 3.5–5.1)
Sodium: 140 mEq/L (ref 135–145)

## 2010-07-26 LAB — PREGNANCY, URINE: Preg Test, Ur: NEGATIVE

## 2010-07-26 LAB — CBC
HCT: 37.7 % (ref 36.0–46.0)
Hemoglobin: 12.3 g/dL (ref 12.0–15.0)
MCHC: 32.8 g/dL (ref 30.0–36.0)
MCV: 88.4 fL (ref 78.0–100.0)
Platelets: 353 10*3/uL (ref 150–400)
RBC: 4.26 MIL/uL (ref 3.87–5.11)
RDW: 12.9 % (ref 11.5–15.5)
WBC: 6.1 10*3/uL (ref 4.0–10.5)

## 2010-07-26 LAB — DIFFERENTIAL
Basophils Absolute: 0 10*3/uL (ref 0.0–0.1)
Basophils Relative: 1 % (ref 0–1)
Eosinophils Absolute: 0.1 10*3/uL (ref 0.0–0.7)
Eosinophils Relative: 1 % (ref 0–5)
Lymphocytes Relative: 36 % (ref 12–46)
Lymphs Abs: 2.2 10*3/uL (ref 0.7–4.0)
Monocytes Absolute: 0.5 10*3/uL (ref 0.1–1.0)
Monocytes Relative: 8 % (ref 3–12)
Neutro Abs: 3.3 10*3/uL (ref 1.7–7.7)
Neutrophils Relative %: 54 % (ref 43–77)

## 2010-07-26 LAB — URINE MICROSCOPIC-ADD ON

## 2010-07-26 LAB — D-DIMER, QUANTITATIVE: D-Dimer, Quant: 0.83 ug/mL-FEU — ABNORMAL HIGH (ref 0.00–0.48)

## 2010-07-27 LAB — URINALYSIS, ROUTINE W REFLEX MICROSCOPIC
Bilirubin Urine: NEGATIVE
Glucose, UA: NEGATIVE mg/dL
Ketones, ur: NEGATIVE mg/dL
Nitrite: NEGATIVE
Protein, ur: 100 mg/dL — AB
Specific Gravity, Urine: 1.023 (ref 1.005–1.030)
Urobilinogen, UA: 0.2 mg/dL (ref 0.0–1.0)
pH: 6 (ref 5.0–8.0)

## 2010-07-27 LAB — CBC
HCT: 33.9 % — ABNORMAL LOW (ref 36.0–46.0)
Hemoglobin: 11.6 g/dL — ABNORMAL LOW (ref 12.0–15.0)
MCHC: 34.3 g/dL (ref 30.0–36.0)
MCV: 89.1 fL (ref 78.0–100.0)
Platelets: 340 10*3/uL (ref 150–400)
RBC: 3.8 MIL/uL — ABNORMAL LOW (ref 3.87–5.11)
RDW: 13.3 % (ref 11.5–15.5)
WBC: 8.8 10*3/uL (ref 4.0–10.5)

## 2010-07-27 LAB — DIFFERENTIAL
Basophils Absolute: 0.1 10*3/uL (ref 0.0–0.1)
Basophils Relative: 1 % (ref 0–1)
Eosinophils Absolute: 0.1 10*3/uL (ref 0.0–0.7)
Eosinophils Relative: 1 % (ref 0–5)
Lymphocytes Relative: 27 % (ref 12–46)
Lymphs Abs: 2.4 10*3/uL (ref 0.7–4.0)
Monocytes Absolute: 0.6 10*3/uL (ref 0.1–1.0)
Monocytes Relative: 7 % (ref 3–12)
Neutro Abs: 5.7 10*3/uL (ref 1.7–7.7)
Neutrophils Relative %: 65 % (ref 43–77)

## 2010-07-27 LAB — POCT I-STAT, CHEM 8
BUN: 23 mg/dL (ref 6–23)
Calcium, Ion: 1.13 mmol/L (ref 1.12–1.32)
Chloride: 104 mEq/L (ref 96–112)
Creatinine, Ser: 1.2 mg/dL (ref 0.4–1.2)
Glucose, Bld: 227 mg/dL — ABNORMAL HIGH (ref 70–99)
HCT: 37 % (ref 36.0–46.0)
Hemoglobin: 12.6 g/dL (ref 12.0–15.0)
Potassium: 3.8 mEq/L (ref 3.5–5.1)
Sodium: 138 mEq/L (ref 135–145)
TCO2: 25 mmol/L (ref 0–100)

## 2010-07-27 LAB — URINE MICROSCOPIC-ADD ON

## 2010-07-31 ENCOUNTER — Emergency Department (HOSPITAL_BASED_OUTPATIENT_CLINIC_OR_DEPARTMENT_OTHER)
Admission: EM | Admit: 2010-07-31 | Discharge: 2010-07-31 | Disposition: A | Discharge status: Home / Self Care | Payer: Medicare Other | Attending: Emergency Medicine | Admitting: Emergency Medicine

## 2010-07-31 ENCOUNTER — Emergency Department (INDEPENDENT_AMBULATORY_CARE_PROVIDER_SITE_OTHER): Payer: Medicare Other

## 2010-07-31 DIAGNOSIS — W19XXXA Unspecified fall, initial encounter: Secondary | ICD-10-CM

## 2010-07-31 DIAGNOSIS — M25569 Pain in unspecified knee: Secondary | ICD-10-CM

## 2010-07-31 DIAGNOSIS — Z79899 Other long term (current) drug therapy: Secondary | ICD-10-CM | POA: Insufficient documentation

## 2010-07-31 DIAGNOSIS — E669 Obesity, unspecified: Secondary | ICD-10-CM | POA: Insufficient documentation

## 2010-07-31 DIAGNOSIS — I10 Essential (primary) hypertension: Secondary | ICD-10-CM | POA: Insufficient documentation

## 2010-07-31 DIAGNOSIS — M1710 Unilateral primary osteoarthritis, unspecified knee: Secondary | ICD-10-CM

## 2010-07-31 DIAGNOSIS — F3289 Other specified depressive episodes: Secondary | ICD-10-CM | POA: Insufficient documentation

## 2010-07-31 DIAGNOSIS — E119 Type 2 diabetes mellitus without complications: Secondary | ICD-10-CM | POA: Insufficient documentation

## 2010-07-31 DIAGNOSIS — Y9229 Other specified public building as the place of occurrence of the external cause: Secondary | ICD-10-CM | POA: Insufficient documentation

## 2010-07-31 DIAGNOSIS — F329 Major depressive disorder, single episode, unspecified: Secondary | ICD-10-CM | POA: Insufficient documentation

## 2010-07-31 DIAGNOSIS — S82409A Unspecified fracture of shaft of unspecified fibula, initial encounter for closed fracture: Secondary | ICD-10-CM | POA: Insufficient documentation

## 2010-07-31 DIAGNOSIS — IMO0002 Reserved for concepts with insufficient information to code with codable children: Secondary | ICD-10-CM

## 2010-08-01 ENCOUNTER — Telehealth: Payer: Self-pay | Admitting: Internal Medicine

## 2010-08-01 NOTE — Telephone Encounter (Signed)
Patient called and states that she "broke her fibula" and would like to know what she needs to do. Patient was instructed to go to ED ASAP and call her PCP on 08/02/10 (Monday). Patient verbalized understanding.

## 2010-08-02 ENCOUNTER — Encounter: Payer: Self-pay | Admitting: *Deleted

## 2010-08-03 ENCOUNTER — Telehealth (INDEPENDENT_AMBULATORY_CARE_PROVIDER_SITE_OTHER): Payer: Medicare Other | Admitting: *Deleted

## 2010-08-03 DIAGNOSIS — L0291 Cutaneous abscess, unspecified: Secondary | ICD-10-CM

## 2010-08-03 DIAGNOSIS — L0390 Cellulitis, unspecified: Secondary | ICD-10-CM

## 2010-08-03 MED ORDER — DOXYCYCLINE HYCLATE 100 MG PO CAPS: 100.0000 mg | ORAL_CAPSULE | Freq: Two times a day (BID) | ORAL | Status: AC

## 2010-08-03 NOTE — Telephone Encounter (Signed)
Pt called and states she uses [facility] [address] now, abx cancelled at [facility] and [facility] to [facility]. Pt states she cannot come in for an appt at this time due to broken bone, she will call when she is able to come in.

## 2010-08-03 NOTE — Telephone Encounter (Signed)
RTC to pt -said that she is not going to have her surgery done.  Pt said that she fractured her left fibula.  Pt said that she also has a boil inside her leg and would like to get an antibiotic for it.  Said that she would like to get a call from Dr. Phillips Odor if possible as she said that she has difficulty coming in for an appointment.  Pt was told that a message would be given to see if Dr. Phillips Odor would like to have pt come in.

## 2010-08-03 NOTE — Telephone Encounter (Signed)
Sent script in for ABX, instruct her to use warm compresses. Schedule an appointment for her to be seen with any provider to evaluate skin abscess this week if possible. If she has a fever or the areas is getting worse have her go to ED for evaluation and possible I&D.

## 2010-08-04 ENCOUNTER — Other Ambulatory Visit: Payer: Medicare Other | Admitting: Internal Medicine

## 2010-08-07 ENCOUNTER — Emergency Department (HOSPITAL_BASED_OUTPATIENT_CLINIC_OR_DEPARTMENT_OTHER)
Admission: EM | Admit: 2010-08-07 | Discharge: 2010-08-07 | Disposition: A | Discharge status: Home / Self Care | Payer: Medicare Other | Attending: Emergency Medicine | Admitting: Emergency Medicine

## 2010-08-07 ENCOUNTER — Emergency Department (INDEPENDENT_AMBULATORY_CARE_PROVIDER_SITE_OTHER): Payer: Medicare Other

## 2010-08-07 DIAGNOSIS — K219 Gastro-esophageal reflux disease without esophagitis: Secondary | ICD-10-CM | POA: Insufficient documentation

## 2010-08-07 DIAGNOSIS — M1710 Unilateral primary osteoarthritis, unspecified knee: Secondary | ICD-10-CM

## 2010-08-07 DIAGNOSIS — W19XXXA Unspecified fall, initial encounter: Secondary | ICD-10-CM

## 2010-08-07 DIAGNOSIS — M79609 Pain in unspecified limb: Secondary | ICD-10-CM

## 2010-08-07 DIAGNOSIS — I10 Essential (primary) hypertension: Secondary | ICD-10-CM | POA: Insufficient documentation

## 2010-08-07 DIAGNOSIS — IMO0002 Reserved for concepts with insufficient information to code with codable children: Secondary | ICD-10-CM

## 2010-08-07 DIAGNOSIS — Z79899 Other long term (current) drug therapy: Secondary | ICD-10-CM | POA: Insufficient documentation

## 2010-08-07 DIAGNOSIS — S82839A Other fracture of upper and lower end of unspecified fibula, initial encounter for closed fracture: Secondary | ICD-10-CM | POA: Insufficient documentation

## 2010-08-07 DIAGNOSIS — S8290XD Unspecified fracture of unspecified lower leg, subsequent encounter for closed fracture with routine healing: Secondary | ICD-10-CM

## 2010-08-07 DIAGNOSIS — E119 Type 2 diabetes mellitus without complications: Secondary | ICD-10-CM | POA: Insufficient documentation

## 2010-08-09 ENCOUNTER — Ambulatory Visit: Payer: Medicare Other | Admitting: Cardiology

## 2010-08-13 ENCOUNTER — Ambulatory Visit: Payer: Medicare Other | Admitting: Cardiovascular Disease

## 2010-08-17 ENCOUNTER — Encounter: Payer: Self-pay | Admitting: Cardiovascular Disease

## 2010-08-17 ENCOUNTER — Other Ambulatory Visit (HOSPITAL_COMMUNITY): Payer: Self-pay | Admitting: Cardiovascular Disease

## 2010-08-18 ENCOUNTER — Encounter (HOSPITAL_COMMUNITY)
Admission: RE | Admit: 2010-08-18 | Discharge: 2010-08-18 | Disposition: A | Discharge status: Home / Self Care | Payer: Medicare Other | Source: Ambulatory Visit | Attending: Cardiovascular Disease | Admitting: Cardiovascular Disease

## 2010-08-18 DIAGNOSIS — R079 Chest pain, unspecified: Secondary | ICD-10-CM | POA: Insufficient documentation

## 2010-08-19 ENCOUNTER — Encounter (HOSPITAL_COMMUNITY): Payer: Self-pay

## 2010-08-19 MED ORDER — TECHNETIUM TC 99M TETROFOSMIN IV KIT: 30.0000 | PACK | Freq: Once | INTRAVENOUS | Status: AC | PRN

## 2010-08-19 MED ORDER — TECHNETIUM TC 99M TETROFOSMIN IV KIT: 30.0000 | PACK | Freq: Once | INTRAVENOUS | Status: DC | PRN

## 2010-08-19 MED ORDER — TECHNETIUM TC 99M TETROFOSMIN IV KIT
30.0000 | PACK | Freq: Once | INTRAVENOUS | Status: AC | PRN
  Administered 2010-08-19: 30 via INTRAVENOUS

## 2010-08-24 NOTE — Op Note (Signed)
NAMESUELLEN, Maria [name]            ACCOUNT NO.:  [id number]   MEDICAL RECORD NO.:  [id number]          PATIENT TYPE:  AMB   LOCATION:  DAY                          FACILITY:  [facility]   PHYSICIAN:  Mark C. Vernie Ammons, M.D.  DATE OF BIRTH:  06/14/1960   DATE OF PROCEDURE:  11/07/2007  DATE OF DISCHARGE:                               OPERATIVE REPORT   PREOPERATIVE DIAGNOSIS:  Bilateral ureteral calculi.   POSTOPERATIVE DIAGNOSES:  1. Left ureteral calculus.  2. Left renal calculus.  3. Passed right ureteral calculus.   PROCEDURE:  1. Cystoscopy.  2. Bilateral ureteral catheterizations.  3. Bilateral retrograde pyelograms with interpretations.  4. Left ureteroscopy.  5. In situ laser lithotripsy.  6. Renal stone extraction.  7. Double-J stent placement.   ANESTHESIA:  General.   SPECIMENS:  Stone given to the patient.   BLOOD LOSS:  Minimal (less than 2 mL).   DRAINS:  A 6-French 24 cm double-J stent in the left ureter (with  string).   COMPLICATIONS:  None.   INDICATIONS:  The patient is a 50 year old female who has a history of  kidney stones.  She was having severe right flank pain and ended up  having to go the emergency room where a CT scan revealed a right distal  ureteral stone, as well as a left UPJ stone and left renal calculus as  well.  She is brought to the operating room for surgical treatment of  the bilateral ureteral calculi and I have discussed the risks,  complications, alternatives, limitations with her.  She understands and  elected to proceed.   DESCRIPTION OF OPERATION:  After informed consent, the patient was the  major OR, placed on the table, administered general anesthesia and then  in the dorsal lithotomy position.  Her genitalia were sterilely prepped  and draped and an official time-out was then performed.   A 22-French cystoscope was introduced per urethra under direct vision  with a 12 degrees lens and the bladder was entered and fully  inspected.  No tumor, stones or inflammatory lesions were identified.  The right  ureteral orifice was identified and then cannulated with a 6-French open-  ended ureteral catheter.  A retrograde pyelogram was then performed.   Retrograde pyelography was performed using full strength contrast  injected through the open-ended ureteral catheter, up the right ureter  under direct fluoroscopy.  This revealed no filling defects in the right  ureter and no dilation of the ureter, as well as no abnormality  throughout the course of ureter or in the area of the renal collecting  system.  I then observed the contrast passing down the ureter under  fluoroscopy and on out the ureter unimpeded without any evidence of  stone in the right ureter.  The patient reported to me preoperatively  that she thought she passed a right ureteral stone.  This confirmed that  was the case.   I then performed an identical procedure on the left-hand side in an  identical fashion.  As I did this I was able to see a filling defect in  the area of  the renal pelvis, however, I did not appreciate the stone at  the UPJ that was seen on her CT scan.   Through the 6-French ureteral catheter a 0.038 inches floppy tip  guidewire was passed up the left ureter under direct fluoroscopy in the  area of the renal pelvis and I noted some holdup just below the UPJ, but  then the guidewire passed on into the renal pelvis.  With the guidewire  in place the ureteroscopy was performed.   The 6-French rigid long ureteroscope was then passed under direct vision  through the urethra and into the left ureteral orifice without  difficulty and was passed up the left ureter quite easily until it  reached an area just below the UPJ right where I could not advance the  scope any further.  I therefore injected contrast and in doing so I  could see a filling defect as well as visualize a very large stone  impacted at this location.  I therefore  performed a laser lithotripsy on  the stone.   A 265 micron holmium laser fiber was passed through the ureteroscope and  I fragmented the stone completely using this fiber.  After the stone was  completely fragmented I passed the rigid ureteroscope on into the area  of the renal pelvis, but could not see the floor of the renal pelvis due  to the rigid nature of the scope.  So I passed the guidewire through the  scope into the renal pelvic region and left the guidewire in place and  backed the scope off of the guidewire.  The 7-French Storz flexible  ureteroscope was then passed over the guidewire easily into the area of  the renal pelvis and the guidewire was removed.  Flexible ureteroscopy  was performed and I inspected each calix of the kidney and found no  abnormality in the upper or middle pole calyces nor in the lower pole.  However, on the floor of the kidney was a round stone consistent with  the round filling defect seen on fluoroscopy.  I was able to grasp this  with a nitinol basket and extracted it easily with the ureteroscope.   The cystoscope was then reinserted and the open-ended ureteral catheter  was passed into the left distal ureter and the guidewire passed through  the open-ended scope into the area of the renal pelvis.  I left the  guidewire in place and passed the double-J stent over the guidewire and  began removing the guidewire with good curl being noted in the renal  pelvis.  So the guidewire was entirely removed.  The string was left  affixed to the distal aspect of the stent and the bladder was drained  and the string affixed to the lower abdominal wall with tape.  The  patient tolerated the procedure well.  There were no intraoperative  complications.   She will be given a prescription for 30 Tylox for pain and follow-up in  my office in 1 week for stent removal.  I gave her the stone and she  will bring that back with her and it will be sent for  compositional  analysis.      Mark C. Vernie Ammons, M.D.  Electronically Signed    MCO/MEDQ  D:  11/07/2007  T:  11/07/2007  Job:  [id number]

## 2010-08-24 NOTE — Op Note (Signed)
Maria [name], Maria [name]            ACCOUNT NO.:  [id number]   MEDICAL RECORD NO.:  [id number]          PATIENT TYPE:  AMB   LOCATION:  DAY                          FACILITY:  [facility]   PHYSICIAN:  Bertram Millard. Dahlstedt, M.D.DATE OF BIRTH:  1960/08/21   DATE OF PROCEDURE:  06/06/2007  DATE OF DISCHARGE:                               OPERATIVE REPORT   PREOPERATIVE DIAGNOSIS:  Left 8 mm ureteropelvic junction stone, right 2  mm distal ureteral stone.   POSTOPERATIVE DIAGNOSIS:  Left 8 mm ureteropelvic junction stone, right  2 mm distal ureteral stone.   PROCEDURE:  1. Cystoscopy.  2. Left retrograde pyelogram.  3. Left ureteroscopy.  4. Laser lithotripsy.  5. Basket extraction of stone.  6. Right ureteroscopy.   SURGEON:  Bertram Millard. Dahlstedt, M.D.   RESIDENT:  Georgeanna Lea   ANESTHESIA:  General.   DRAINS:  None.   INDICATIONS FOR PROCEDURE:  The patient is a 50 year old female who was  evaluated by Dr. Retta Diones with complaints of right flank pain and  having had a CT scan of the abdomen and pelvis which revealed right 2 mm  distal ureteral stone with some hydroureteronephrosis and left 8 mm UPJ  stone without any obstruction.  The patient was evaluated by Dr.  Retta Diones in clinic.  At that point, the patient mentioned that her  right flank  pain had gotten better, and supposedly he thought that she  may have passed the stone.  However, the patient still had an 8 mm UPJ  stone.  Treatment options were discussed with the patient, and she was  scheduled for cystoscopy, left retrograde pyelogram, left ureteroscopy  and stone extraction.   DESCRIPTION OF PROCEDURE IN DETAIL:  After obtaining the informed  consent in the holding area, the patient was brought to the operating  room.  The patient was placed in a supine position, administered general  anesthesia per the anesthesia team.  A proper time out was performed  identifying the correct patient, procedure and the site.   The patient  was subsequently placed in the dorsal lithotomy position.  Care was  taken to well pad the pressure points.  The patient was subsequently  prepped and draped in the usual sterile manner.  Then, using a 12-degree  lens, a 22-French cystoscopy sheath performed pancystoureteroscopy.  The  patient's urethra appeared normal.  Upon entering the bladde, the  patient's right and left ureters were identified  normal anatomic  position effluxing clear urine.  We then went ahead and cannulated the  left ureteric orifice using an open-end 6-French ureteral catheter with  the help of sensor guide wire.  We then performed left retrograde  pyelogram.  Left retrograde pyelogram revealed filling defect in the  renal pelvis.  The rest of the pyelogram did not reveal any filling  defect along the course of the ureter.  We then again passed the sensor  guide wire up into the left kidney under fluoroscopic guidance and then  removed the urethral catheter and advanced a 12 x 24 access sheath.  We  then removed the guide wire along with the  inner sheath.  We then used  flexible ureteroscope and performed left ureteroscopy.  Roughly about 8  mm size stone was seen at the junction of the ureter with the left  pelvis.  We passed the nitinol basket and attempted to get the stone out  along with the ureteroscope, however, along mid ureter, we had some  difficulty.  At that point, the stone was disengaged from the nitinol  basket and a decision was made to go ahead and do laser lithotripsy.  We  then passed a 200 micron laser fiber, and at the setting of 0.8 joules  and the rate of 5, we systematically performed a laser lithotripsy of  the stone, the stone was broken down into three pieces.  We then  subsequently removed the laser fiber and passed the nitinol basket once  again and systematically removed the stone pieces.  After removing the  stone pieces, ureteroscopy was performed to make sure there  were no  remaining stone pieces, none were seen after systematic ureteroscopy of  upper mid pole and lower  pole calyces.  At that point, I removed the  ureteroscope and removed the access sheath.  We then used a rigid  ureteroscope and then performed a right ureteroscopy after dilating the  right ureter by passing the inner sheath of the access sheath.  No  stones; removed the guide wire.  The patient was subsequently extubated  and transferred in stable condition to the recovery room.  Also note  that Dr. Retta Diones was present and available for all the aspects of the  case.     ______________________________  Andree Moro. Dahlstedt, M.D.  Electronically Signed    JJ/MEDQ  D:  06/06/2007  T:  06/07/2007  Job:  [id number]

## 2010-08-24 NOTE — Discharge Summary (Signed)
NAMEJAYME, MEDNICK            ACCOUNT NO.:  [id number]   MEDICAL RECORD NO.:  [id number]          PATIENT TYPE:  OBV   LOCATION:  5019                         FACILITY:  [facility]   PHYSICIAN:  Mick Sell, MD DATE OF BIRTH:  April 29, 1960   DATE OF ADMISSION:  05/20/2007  DATE OF DISCHARGE:  05/21/2007                               DISCHARGE SUMMARY   DISCHARGE MEDICATION:  1. Glucophage 1000 mg b.i.d.  2. Glucotrol 5 mg b.i.d.  3. Hydrochlorothiazide 25 mg daily.  4. Toprol 200 mg daily.  5. Lisinopril 40 mg daily.  6. Protonix 40 mg daily.  7. Lantus 34 units subcu daily.  8. Ketorolac 30 mg daily.  9. Percocet 5/325 one to two tablets q.4h. p.r.n.  10.Flomax 0.4 mg daily.  11.Phenergan 12.5 mg p.o. q.6h.   DISCHARGE DIAGNOSES:  1. Kidney stone.  2. Diabetes.  3. Hypertension.   DISPOSITION:  The patient will follow-up with Dr. Phillips Odor on May 23, 2007.  Here, her blood pressure will be evaluated, also her  diabetes.  Will also know how the pain is doing and follow up on the  outpatient visit with a neurologist.   PROCEDURES PERFORMED:  1. A CT scan of the abdomen which showed right mild hydroureteral      nephrosis with delayed contrast excretion compatible with partial      obstruction.  Nonobstructing 8 mm left UPJ stone.  Also showed no      calculi on the right and some calcifications in the right lower      pelvis adjacent to the ureter, question of stone.  2. Ultrasound.  Minimal hydronephrosis slightly less than expected      from prior CT.  Calculus at the left renal pelvis.   BRIEF ADMISSION HISTORY AND PHYSICAL:  A 50 year old with past medical  history of hypertension, diabetes and recurrent UTI which is being  admitted for intractable pain and nausea.  She also is being admitted  for evaluation of her kidney stone complication after being discharged  from the ED yesterday.  On the day prior to admission she was given  Dilaudid and has not had  any relief from her pain.  Notably, she has had  some pain, 10/10, back, flank and groin, fever and chills.  She says she  has normal urine but she says she does not have any frequency, but she  has some hesitancy.  A CT from yesterday showed right hydronephrosis.   On admission, her temperature was 97, blood pressure 143/93, pulse 92,  respirations 60.  She was sating 96% on room air.  Sodium 138, potassium  3.5, chloride 100, bicarb 29, BUN 11, creatinine 1, glucose 175.  Anion  gap 9.  Bilirubin 0.8, alkaline phosphatase 77, AST 26, ALT 19, protein  6.9, albumin 3.1, calcium 8.9.  White blood cells 11.5, ANC 7.9,  hemoglobin 11.8, platelets 385, MCV 87.2. CT scan showed results as  above.  UA showed gross hematuria with small leukocytes and negative  nitrates.   HOSPITAL COURSE:  PROBLEM #1 -  The patient was admitted to the floor.  She was started on aggressive IV hydration, also Dilaudid for pain.  By  the next day, her pain was much better controlled.  Ketorolac was added  and her IV medication was changed to p.o. which she tolerated.  She was  having foods so neurology was consulted and they recommended follow-up  as an outpatient.   PROBLEM #2 -  DIABETES:  Because she was afebrile, her Lantus was cut in  half and she was put n.p.o.  She was kept on sliding scale and glipizide  and metformin was also held.   PROBLEM #3 -  HYPERTENSION:  Her beta-blocker was continues and her  blood pressure remained stable.  Lisinopril was held but it was  restarted after discharge.   On the day of discharge, her vitals were temperature 98, pulse 57,  respiration 20, blood pressure 130/74.  She was sating 97% on room air,  blood glucose was 164.   LABORATORY DATA:  Hemoglobin A1c 7.5.  Urine culture 15,000 colonies.  Sodium 137, chloride 102, bicarb 27, glucose 157, BUN 12, creatinine  0.8, potassium was 3.8.  White blood cell 7.5, hemoglobin 10.2,  platelets 328.      Marinda Elk, M.D.  Electronically Signed      Mick Sell, MD  Electronically Signed    AF/MEDQ  D:  05/21/2007  T:  05/22/2007  Job:  [id number]   cc:   Dr. Phillips Odor

## 2010-08-27 NOTE — Group Therapy Note (Signed)
Maria [name], COUTS NO.:  [id number]   MEDICAL RECORD NO.:  [id number]          PATIENT TYPE:  WOC   LOCATION:  [facility]                   FACILITY:  [facility]   PHYSICIAN:  Tinnie Gens, MD        DATE OF BIRTH:  1961-01-06   DATE OF SERVICE:  02/19/2004                                    CLINIC NOTE   CHIEF COMPLAINT:  Follow up heavy abnormal bleeding with severe pain.   PROBLEM:  1.  [name] is a 50 year old African-American female who comes in today      reporting that she had had very heavy periods for the past 6 months.      She had a BTL and had been on Depo-Provera to regulate her menses until      March.  Ever since then she has had extremely heavy periods which she      describes as hemorrhaging and right lower quadrant pain with menses      that has been severe enough that she has gone to urgent care.  She      currently has been cramping and had pain on that right lower quadrant      for the past 7 days and has started spotting in the past couple of days.      She does have a family history of fibroids and is concerned that she may      also have fibroids.  2.  Change in discharge.  She notes a watery discharge that is pruritic with      no change in color or odor.  3.  Depression.  The patient has been very tearful and the past several days      has been very upset.  She lost a son 2 years ago that was in the army      and she has a daughter who afterwards became involved with drugs and has      recently been in prison and the patient is having a hard time in dealing      with some of these issues.   OBJECTIVE:  VITAL SIGNS:  Per chart.  GENERAL:  A very obese 50 year old African-American female.  PELVIC:  Her external genitalia appears normal.  Her vaginal mucosa is  somewhat pale but moist and pink.  Did not see any discharge or erythema at  the cervical os.  With palpation, she has had some tenderness in the right  lower quadrant but I am  unable to palpate her ovaries secondary to habitus.  Wet prep was performed showing positive yeast and positive clue cells.   ASSESSMENT AND PLAN:  1.  Menorrhagia.  Will have the patient return in 1 to 2 weeks for      endometrial biopsy since she is over the age of 43 and with such heavy      menses but may be secondary to obesity.  2.  Right lower quadrant pain recurrent especially with menses.  We will get      a pelvic ultrasound to rule out cysts or fibroids.  Also consider  endometritis.  3.  Yeast vaginitis.  Will treat with metronidazole 150 mg one p.o. x1.  4.  Bacterial vaginitis.  Will treat with metronidazole 500 mg one p.o.      b.i.d. x7 days.  5.  Acute episode of depression.  We recommend that the patient make an      appointment with Dr. __________, her      regular care physician, so she can be further evaluated.  The patient is      in agreement and does not think she will harm herself at this time.  In      fact, she said she will make an appointment tomorrow.      CM/MEDQ  D:  02/19/2004  T:  02/20/2004  Job:  [id number]

## 2010-08-27 NOTE — Discharge Summary (Signed)
[place]  Patient:    OAKLYN, JAKUBEK Visit Number: [id number] MRN: [id number]          Service Type: MED Location: [phone] Attending Physician:  Alva Garnet. Dictated by:   Merlene Laughter Renae Gloss, M.D. Admit Date:  06/04/2001 Discharge Date: 06/05/2001                             Discharge Summary  DISCHARGE DIAGNOSES: 1. Atypical chest pain. 2. Hypertension. 3. Type 2 diabetes mellitus.  HOSPITAL COURSE:  Mrs. Renette Butters was admitted for evaluation for midsternal chest pain which was associated with nausea and vomiting.  Cardiac evaluation including serial troponin enzymes and CK-MBs were unremarkable as well as unremarkable telemetry.  Cardiology consultation was obtained and Cardiolite stress test was performed which was also unremarkable.  It was decided that Mrs. Rutherford Guys chest pain did not have a cardiac etiology.  Most likely concerns include musculoskeletal or GI causes.  DISPOSITION:  At the time of discharge, Mrs. Renette Butters was chest pain free.  She was eating and ambulating without complications.  DISCHARGE MEDICATIONS: 1. Glucophage 500 mg one p.o. b.i.d. 2. Lotensin/HCT 20/25 one p.o. q.d.  FOLLOW-UP:  Mrs. Renette Butters will be seen by Dr. Renae Gloss within two weeks following discharge. Dictated by:   Merlene Laughter Renae Gloss, M.D. Attending Physician:  Andi Devon R. DD:  07/05/01 TD:  07/06/01 Job: [id number]

## 2010-08-27 NOTE — Op Note (Signed)
[name], [name]            ACCOUNT NO.:  [id number]   MEDICAL RECORD NO.:  [id number]          PATIENT TYPE:  AMB   LOCATION:  SDS                          FACILITY:  [facility]   PHYSICIAN:  Doralee Albino. Carola Frost, M.D. DATE OF BIRTH:  [birth year]   DATE OF PROCEDURE:  07/05/2005  DATE OF DISCHARGE:  07/05/2005                                 OPERATIVE REPORT   PREOPERATIVE DIAGNOSES:  1.  Left knee tricompartmental arthritis.  2.  Left knee tricompartmental arthritis.   POSTOPERATIVE DIAGNOSES:  1.  Left knee medial and lateral meniscus tears.  2.  Left knee tricompartmental arthritis.   PROCEDURES:  1.  Medial and lateral partial meniscectomies.  2.  Tricompartmental chondroplasty.   SURGEON:  Myrene Galas, MD   ASSISTANT:  None.   ANESTHESIA:  General.   COMPLICATIONS:  None.   TOURNIQUET:  None.   ESTIMATED BLOOD LOSS:  Minimal.   SPECIMENS:  None.   DISPOSITION:  To PACU.   CONDITION:  Stable.   BRIEF SUMMARY OF INDICATIONS FOR PROCEDURE:  Maria [name] is a very  pleasant 50 year old female with morbid obesity who sustained a work-related  injury resulting in left knee mechanical symptoms and severe lateral  compartment pain.  We discussed at length with her the underlying  tricompartmental arthritis and the possibility that surgical intervention  would not dramatically improve her symptoms.  She also underwent a prolonged  course of conservative measures under the management of Dr. Frazier Butt who  again stressed temporal relationship between her new acute injury to the  lateral meniscus and the onset of symptoms which really took her from  chronic knee pain to intolerable knee pain preventing her from performing  every day activities.  Given the magnitude of symptoms and the relationship  to the new diagnosis, we did feel that operative intervention was a  reasonable alternative despite the possibility that she may not have  dramatic improvement.  She  understood this quite well also and did wish to  proceed in light of this information.   DESCRIPTION OF PROCEDURE:  Ms Maria [name] was taken to the operating room where  general anesthesia was induced.  Her left lower extremity was prepped and  draped in the usual sterile fashion.  A tourniquet was placed about the  thigh but was never inflated during the case.  We then established an  inferior lateral and medial portal with the medial being performed under  direct visualization from within the joint.  Once in the joint, we noted  immediately extensive grade III chondromalacia over essentially the entire  weightbearing surface of the medial femoral condyle.  The medial tibial  plateau also had about 60% grade III changes with 40% grade II.  The  meniscus was well-preserved except in the far posterior horn where a  degenerative tear was present, and this was debrided with the up-biter and  rounded back to a smooth surface.  There were no deep extensions all the way  to the margin requiring a more aggressive resection.  Chondroplasty was  performed of some loose chondral flaps over the medial femoral condyle  as  well as the tibial surface.  The ACL was then examined and noted to be  intact and robust and healthy in appearance.  There was extensive synovitis  in the anterior aspect of the knee and we were careful to steer clear of  most of this to avoid significant bleeding.  The knee was then brought into  the figure-four position, and this allowed [place] to inspect the lateral  compartment.  There were extensive grade IV changes over the tibial surface.  There was a large radial tear of the lateral meniscus extending nearly to  the border.  This tear was out in the central portion of the compartment.  The lateral femoral condyle had a mixture of grade II and III changes with  some loose chondral flaps which did require debridement.  The lateral  meniscus was trimmed back to a stable rim leaving as  much intact as  possible.  All loose debris created by the debridement was removed with a  cannula and shaver.   Attention was then turned to the trochlea which was examined directly.  There were some grade II and III changes with large, loose, orange-peel-type  chondral flaps.  These were debrided back with the shaver to a stable margin  and then the patella visualized.  The patella did not have any major  unstable areas that were visualized.  There were no loose bodies identified  in the suprapatellar pouch.  The knee was then injected with about 25 mL of  0.25% Marcaine with epinephrine and morphine mixed.  The portals were  reapproximated with 3-0 nylon and a sterile gently compressive dressing  applied up.  The patient was awakened from anesthesia and transported to the  PACU in stable condition.   PROGNOSIS:  Ms Maria [name] has had significant injury to her left knee with  tricompartmental arthritic changes but with a clearly apparent massive  lateral meniscus tear.  We were able to debride that back to a stable margin  and also to repair much of the loose and probably symptomatic chondral  changes, and this should allow her to participate with therapy and give  involved in a regimen which will reduce her knee pain and increase her  function.  Will plan to see her back in about 10 days for followup.  She  will be weightbearing as tolerated with range of motion as tolerated until  that time.  She because of her size, she is also at some risk for a DVT.  Given that this is a knee scope and that we did not inflate the tourniquet,  this risk is significantly reduced.  I have counseled her regarding this and  the importance of motion and being active as well as the signs associated  with DVT or PE.  I have also instructed her to begin taking an aspirin daily  starting on postoperative day #2.      Doralee Albino. Carola Frost, M.D.  Electronically Signed    MHH/MEDQ  D:  07/05/2005  T:   07/07/2005  Job:  [id number]

## 2010-08-27 NOTE — H&P (Signed)
[facility]  Patient:    Maria [name], Maria [name] Visit Number: [id number] MRN: [id number]          Service Type: MED Location: [phone] Attending Physician:  Alva Garnet. Dictated by:   Merlene Laughter Renae Gloss, M.D. Admit Date:  06/04/2001                           History and Physical  CHIEF COMPLAINT:  Palpitations, chest pain.  HISTORY OF PRESENT ILLNESS:  Maria [name] is a 50 year old lady who presented today with a four- to five-day history of recurrent palpitations associated with some shortness of breath and midsternal chest pain/discomfort.  She denies radiation of pain into her extremities and she further denies nausea, vomiting or diaphoresis.  She has had no previous episodes of chest pain, and has no other acute constitutional or systemic complaints at this time.  ALLERGIES:  No known drug allergies.  MEDICATIONS: 1. Glucophage 500 mg one p.o. b.i.d. 2. Lotensin HCTZ 20/25 mg one p.o. q.d. 3. Covera 180 mg p.o. q.d.  PAST MEDICAL HISTORY:  Maria [name] is adopted and she currently works for a [employer].  She denies tobacco, alcohol or drugs of abuse.  SOCIAL HISTORY:  Maria [name] denies tobacco or alcohol or drugs of abuse.  REVIEW OF SYSTEMS:  As per HPI and health history assessment.  Otherwise, negative, greater than 10 systems reviewed.  PHYSICAL EXAMINATION:  GENERAL APPEARANCE:  Well-developed, well-nourished, obese black female in no acute distress.  VITAL SIGNS:  Blood pressure is 145/76, temperature 97.7, heart rate 109, respiratory 20.  Weight 340 pounds.  Height 5 foot 8-1/2 inches.  HEENT:  TMs within normal limits bilaterally.  No OP lesions.  PERRLA.  NECK:  Supple.  No masses.  Carotids 2+.  No bruits.  LUNGS:  Clear to auscultation.  HEART:  S1 and S2.  Regular rate and rhythm.  No murmurs, rubs, or gallops.  ABDOMEN:  Soft, nontender, nondistended.  Positive bowel sounds.  EXTREMITIES:  No  clubbing, cyanosis, or edema.  NEUROLOGIC:  Alert and oriented x3.  Cranial nerves intact.  ASSESSMENT AND PLAN: 1. Chest pain with palpitations.  Maria [name] will be admitted to telemetry.    Serial CK-MB and troponin I enzymes will be obtained.  Thus far, telemetry    does show sinus rhythm with very mild tachycardia.  Other possible    etiologies of her symptoms may be thyroid disease and a thyroid function    test will be obtained during this admission as well.  Since she does have    significant cardiac risk factors including hypertension, diabetes and    obesity, a cardiology consultation will be obtained during this admission. 2. Hypertension, currently well-controlled. 3. Type 2 diabetes mellitus.  She will continue on Glucotrol during this    admission and hemoglobin A1c will be obtained as well. Dictated by:   Merlene Laughter Renae Gloss, M.D. Attending Physician:  Andi Devon R. DD:  06/04/01 TD:  06/04/01 Job: [id number]

## 2010-08-27 NOTE — Group Therapy Note (Signed)
NAMETEMITOPE, FLAMMER NO.:  [id number]   MEDICAL RECORD NO.:  [id number]          PATIENT TYPE:  WOC   LOCATION:  [facility]                   FACILITY:  [facility]   PHYSICIAN:  Montey Hora, M.D.    DATE OF BIRTH:  1961/02/06   DATE OF SERVICE:  11/02/2005                                    CLINIC NOTE   CHIEF COMPLAINT:  Vaginal discharge.   HISTORY:  This is a 50 year old who began to have sex without a condom with  her fiance a couple months ago and has noticed since then that she has a  moist vaginal discharge that is slightly itchy and makes her panties wet but  does not dry, yellow color on the underwear and so she has come today to  make sure that it is not a yeast infection.   MEDICINES:  Per med sheet.   ALLERGIES:  DARVOCET.  No latex allergy.   LAST PAP SMEAR:  December 2005.   LAST MAMMOGRAM:  A year ago.   PHYSICAL EXAMINATION:  Vitals are normal.  Pelvic shows normal external  genitalia, normal vaginal mucosa, mild to moderate clear vaginal discharge,  odor present.  Wet prep taken.   ASSESSMENT AND PLAN:  1.  Vaginal discharge suspicious for bacterial vaginosis.  Did send a wet      prep.  We will call the patient with the results tomorrow.  2.  Behind on Pap smear.  The patient asked to schedule a Pap smear shortly.           ______________________________  Montey Hora, M.D.     KR/MEDQ  D:  11/02/2005  T:  11/02/2005  Job:  [id number]

## 2010-08-27 NOTE — Group Therapy Note (Signed)
Maria [name], Maria [name]             ACCOUNT NO.:  [id number]   MEDICAL RECORD NO.:  [id number]          PATIENT TYPE:  WOC   LOCATION[facility]  [facility]                   FACILITY:  [facility]   PHYSICIAN:  Tinnie Gens, MD        DATE OF BIRTH:  [birth year]   DATE OF SERVICE:  03/25/2004                                    CLINIC NOTE   CHIEF COMPLAINT:  Follow-up abnormal bleeding.   HISTORY OF PRESENT ILLNESS:  Patient is a 50 year old para 1 who is status  post BTL who comes in for heavy bleeding.  She has had this in the past.  Underwent endometrial biopsy last in March of 2004 and has not had a Depo  shot which usually works well to control her bleeding for quite a while.  She desires this treatment again and is here for her biopsy.  Since her last  visit here the patient has undergone pelvic ultrasound which shows the  uterus at 9 cm with no abnormalities and a thin endometrial stripe.  Her  ovaries are within normal limits.  The patient complains today of some right  lower quadrant that comes with her cycles.   PHYSICAL EXAMINATION:  GENERAL:  She is an obese female in no acute  distress.  VITAL SIGNS:  Blood pressure 141/84, weight 381.9.  GENITOURINARY:  Normal external female genitalia.  Vagina is rugae and pink.  Cervix is visualized.  __________ at the 12 o'clock position.  A Pap smear  is obtained.   PROCEDURE:  The patient's cervix is cleaned with Betadine and sounded to 8  cm.  Endometrial Pipelle passed without difficulty and endometrial sampling  taken.  Bimanual examination is significantly limited by the patient's body  habitus.   IMPRESSION:  Abnormal uterine bleeding.   PLAN:  Endometrial biopsy today.  Depo Provera today.      TP/MEDQ  D:  03/25/2004  T:  03/26/2004  Job:  [id number]

## 2010-08-27 NOTE — Group Therapy Note (Signed)
NAME:  Maria [name], DEVENEY NO.:  [id number]   MEDICAL RECORD NO.:  [id number]          PATIENT TYPE:  WOC   LOCATION:  [facility]                   [facility]ACILITY:  [facility]   PHYSICIAN:  Argentina Donovan, MD        DATE OF BIRTH:  March 13, 1961   DATE OF SERVICE:  12/15/2005                                    CLINIC NOTE   The patient is a 50 year old, morbidly obese, African-American female,  gravida 3, para 3-0-0-3, who is a diabetic, on oral medications, and has  recurrent yeast infections, is also on Depo-Provera for dysfunctional  uterine bleeding, in for routine Pap smear.   EXAMINATION:  External genitalia is normal.  BUS is within normal limits.  Vagina is clean and well rugated.  Cervix is clean and parous.  Uterus and  adnexa could not be felt due to habitus of the patient.   We did a Pap smear.  The patient was given a prescription for Diflucan and  Terazol on a p.r.n. renewal basis.  In addition to this, the patient was  given a shot of Depo-Provera 150 IM.  Significant history is that she has  one daughter who is on crack cocaine and she lost a son to friendly fire  three years ago in [place].           ______________________________  Argentina Donovan, MD     PR/MEDQ  D:  12/15/2005  T:  12/15/2005  Job:  [id number]

## 2010-08-27 NOTE — Op Note (Signed)
[facility]  Patient:    APHRODITE, HARPENAU                    MRN: [id number] Proc. Date: 12/01/99 Adm. Date:  [id number] Disc. Date: [id number] Attending:  Milly Jakob CC:         Harvie Junior, M.D.                           Operative Report  PREOPERATIVE DIAGNOSIS:  Degenerative joint disease, left knee.  POSTOPERATIVE DIAGNOSIS: 1. Medial meniscal tear. 2. Lateral meniscal tear. 3. Tight lateral retinaculum. 4. Degenerative joint disease of the medial femoral condyle and medial tibial    plateau, lateral femoral condyle and patellofemoral groove.  OPERATION PERFORMED: 1. Partial medial meniscectomy. 2. Partial lateral meniscectomy. 3. Debridement of medial femoral condyle and medial tibial plateau. 4. Debridement of lateral femoral condyle with vascular enhancement channel    placement. 5. Debridement of chondromalacia patella and trochlea. 6. Lateral retinacular release.  SURGEON:  Harvie Junior, M.D.  ASSISTANT:  Currie Paris. Thedore Mins.  ANESTHESIA:  INDICATIONS FOR PROCEDURE:  The patient is a 50 year old female with a long history of having bilateral knee pain.  She is grossly overweight and has tried measures to loose weight but has been relatively unsuccessful.  Because of significant left knee pain after evaluation, we ultimately elected to undergo arthroscopic evaluation of the knee and she is brought to the operating room for this.  DESCRIPTION OF PROCEDURE:  The patient was taken to the operating room and after adequate anesthesia was obtained with general anesthetic, the patient was placed supine on the operating table.  The left leg was then prepped and draped in the usual sterile fashion.  Following this, routine arthroscopic examination of the knee revealed that there was obvious patellofemoral degeneration, grade 3 changes and some mild grade 4 changes.  This was debrided with a suction shaver.  Attention was  then turned to the medial compartment where there was an osteophyte medially.  This was then passed over with the scope and into the medial compartment.  There was a small partial posterior horn medial meniscus tear which was debrided.  The medial femoral condyle had grade 2 changes.  This was debrided.  The medial tibial plateau had grade 2 changes which was debrided.  A smooth and stable rim was achieved. Attention was then turned laterally where she was noted initially to have, we went past the notch where the ACL was noted to be intact and functioning.  The lateral compartment noted to have grade 4 changes in the central portion. This was debrided with a suction shaver.  There was also a small anterior and small posterior horn lateral meniscal tear which was debrided.  Once there was debridement of this grade 4 changes on the lateral side, a vascular enhancement groove channeler was used and vascular enhancement channels were made in the lateral compartment.  Following this, attention was turned to the patellofemoral compartment where again, debridement was undertaken of the patella and of the trochlea and the lateral retinaculum was identified and this was released laterally.  Once this was done, the tracking of the patella was noted to still be midline but was much easier to pass instruments underneat that time the patella.  At this point the knee was copiously irrigated and suctioned dry.  The arthroscopic portals were closed with Steri-Strips.  A sterile compressive  dressing was applied as well as a lateral bolster and then the patient was taken to the recovery room where she was noted to be satisfactory condition.  Estimated blood loss for this procedure was none. DD:  12/01/99 TD:  12/02/99 Job: [id number]

## 2010-09-01 ENCOUNTER — Ambulatory Visit: Payer: Medicare Other | Admitting: Cardiology

## 2010-09-09 ENCOUNTER — Telehealth: Payer: Self-pay | Admitting: Internal Medicine

## 2010-09-09 NOTE — Telephone Encounter (Signed)
Patient called that she has been feeling dizzy, and has some ear ache. Thinks she has a ear infection. ASked her to be careful when she gets up as orhtostasis can make her dizzy and she is on BP meds. Will try to schedule an appointment for tomorrow in the clinic and also asked her to come to the Ed if she is getting worse.

## 2010-09-15 ENCOUNTER — Ambulatory Visit: Payer: Medicare Other | Admitting: Internal Medicine

## 2010-09-15 ENCOUNTER — Emergency Department (HOSPITAL_COMMUNITY)
Admission: EM | Admit: 2010-09-15 | Discharge: 2010-09-15 | Disposition: A | Discharge status: Home / Self Care | Payer: Medicare Other | Attending: Emergency Medicine | Admitting: Emergency Medicine

## 2010-09-15 ENCOUNTER — Emergency Department (HOSPITAL_COMMUNITY): Payer: Medicare Other

## 2010-09-15 DIAGNOSIS — R109 Unspecified abdominal pain: Secondary | ICD-10-CM | POA: Insufficient documentation

## 2010-09-15 DIAGNOSIS — Z794 Long term (current) use of insulin: Secondary | ICD-10-CM | POA: Insufficient documentation

## 2010-09-15 DIAGNOSIS — N200 Calculus of kidney: Secondary | ICD-10-CM | POA: Insufficient documentation

## 2010-09-15 DIAGNOSIS — Z79899 Other long term (current) drug therapy: Secondary | ICD-10-CM | POA: Insufficient documentation

## 2010-09-15 LAB — POCT I-STAT, CHEM 8
BUN: 19 mg/dL (ref 6–23)
Calcium, Ion: 1.19 mmol/L (ref 1.12–1.32)
Chloride: 99 mEq/L (ref 96–112)
Creatinine, Ser: 1.7 mg/dL — ABNORMAL HIGH (ref 0.4–1.2)
Glucose, Bld: 230 mg/dL — ABNORMAL HIGH (ref 70–99)
HCT: 37 % (ref 36.0–46.0)
Hemoglobin: 12.6 g/dL (ref 12.0–15.0)
Potassium: 3.6 mEq/L (ref 3.5–5.1)
Sodium: 138 mEq/L (ref 135–145)
TCO2: 27 mmol/L (ref 0–100)

## 2010-09-15 LAB — URINALYSIS, ROUTINE W REFLEX MICROSCOPIC
Glucose, UA: NEGATIVE mg/dL
Ketones, ur: NEGATIVE mg/dL
Nitrite: NEGATIVE
Protein, ur: 100 mg/dL — AB
Specific Gravity, Urine: 1.022 (ref 1.005–1.030)
Urobilinogen, UA: 0.2 mg/dL (ref 0.0–1.0)
pH: 5.5 (ref 5.0–8.0)

## 2010-09-15 LAB — URINE MICROSCOPIC-ADD ON

## 2010-09-17 ENCOUNTER — Emergency Department (HOSPITAL_COMMUNITY)
Admission: EM | Admit: 2010-09-17 | Discharge: 2010-09-18 | Disposition: A | Discharge status: Home / Self Care | Payer: Medicare Other | Attending: Emergency Medicine | Admitting: Emergency Medicine

## 2010-09-17 DIAGNOSIS — I10 Essential (primary) hypertension: Secondary | ICD-10-CM | POA: Insufficient documentation

## 2010-09-17 DIAGNOSIS — E119 Type 2 diabetes mellitus without complications: Secondary | ICD-10-CM | POA: Insufficient documentation

## 2010-09-17 DIAGNOSIS — N201 Calculus of ureter: Secondary | ICD-10-CM | POA: Insufficient documentation

## 2010-09-17 DIAGNOSIS — R109 Unspecified abdominal pain: Secondary | ICD-10-CM | POA: Insufficient documentation

## 2010-09-17 LAB — DIFFERENTIAL
Basophils Absolute: 0 10*3/uL (ref 0.0–0.1)
Basophils Relative: 1 % (ref 0–1)
Eosinophils Absolute: 0.3 10*3/uL (ref 0.0–0.7)
Eosinophils Relative: 4 % (ref 0–5)
Lymphocytes Relative: 36 % (ref 12–46)
Lymphs Abs: 2.5 10*3/uL (ref 0.7–4.0)
Monocytes Absolute: 0.5 10*3/uL (ref 0.1–1.0)
Monocytes Relative: 8 % (ref 3–12)
Neutro Abs: 3.7 10*3/uL (ref 1.7–7.7)
Neutrophils Relative %: 52 % (ref 43–77)

## 2010-09-17 LAB — CBC
HCT: 34 % — ABNORMAL LOW (ref 36.0–46.0)
Hemoglobin: 11.2 g/dL — ABNORMAL LOW (ref 12.0–15.0)
MCH: 27.7 pg (ref 26.0–34.0)
MCHC: 32.9 g/dL (ref 30.0–36.0)
MCV: 84 fL (ref 78.0–100.0)
Platelets: 353 10*3/uL (ref 150–400)
RBC: 4.05 MIL/uL (ref 3.87–5.11)
RDW: 13.4 % (ref 11.5–15.5)
WBC: 7.1 10*3/uL (ref 4.0–10.5)

## 2010-09-18 ENCOUNTER — Emergency Department (HOSPITAL_COMMUNITY): Payer: Medicare Other

## 2010-09-18 LAB — URINE MICROSCOPIC-ADD ON

## 2010-09-18 LAB — URINALYSIS, ROUTINE W REFLEX MICROSCOPIC
Glucose, UA: NEGATIVE mg/dL
Hgb urine dipstick: NEGATIVE
Nitrite: NEGATIVE
Protein, ur: 30 mg/dL — AB
Specific Gravity, Urine: 1.029 (ref 1.005–1.030)
Urobilinogen, UA: 1 mg/dL (ref 0.0–1.0)
pH: 5.5 (ref 5.0–8.0)

## 2010-09-18 LAB — BASIC METABOLIC PANEL
BUN: 23 mg/dL (ref 6–23)
CO2: 24 mEq/L (ref 19–32)
Calcium: 9.1 mg/dL (ref 8.4–10.5)
Chloride: 93 mEq/L — ABNORMAL LOW (ref 96–112)
Creatinine, Ser: 2.31 mg/dL — ABNORMAL HIGH (ref 0.4–1.2)
GFR calc Af Amer: 27 mL/min — ABNORMAL LOW (ref >60)
GFR calc non Af Amer: 22 mL/min — ABNORMAL LOW (ref >60)
Glucose, Bld: 245 mg/dL — ABNORMAL HIGH (ref 70–99)
Potassium: 3.7 mEq/L (ref 3.5–5.1)
Sodium: 132 mEq/L — ABNORMAL LOW (ref 135–145)

## 2010-09-20 ENCOUNTER — Ambulatory Visit: Payer: Medicare Other | Admitting: Internal Medicine

## 2010-09-21 ENCOUNTER — Emergency Department (HOSPITAL_COMMUNITY)
Admission: EM | Admit: 2010-09-21 | Discharge: 2010-09-21 | Disposition: A | Discharge status: Home / Self Care | Payer: Medicare Other | Attending: Emergency Medicine | Admitting: Emergency Medicine

## 2010-09-21 DIAGNOSIS — F329 Major depressive disorder, single episode, unspecified: Secondary | ICD-10-CM | POA: Insufficient documentation

## 2010-09-21 DIAGNOSIS — N200 Calculus of kidney: Secondary | ICD-10-CM | POA: Insufficient documentation

## 2010-09-21 DIAGNOSIS — I10 Essential (primary) hypertension: Secondary | ICD-10-CM | POA: Insufficient documentation

## 2010-09-21 DIAGNOSIS — F3289 Other specified depressive episodes: Secondary | ICD-10-CM | POA: Insufficient documentation

## 2010-09-21 DIAGNOSIS — Z9889 Other specified postprocedural states: Secondary | ICD-10-CM | POA: Insufficient documentation

## 2010-09-21 DIAGNOSIS — Z794 Long term (current) use of insulin: Secondary | ICD-10-CM | POA: Insufficient documentation

## 2010-09-21 DIAGNOSIS — R109 Unspecified abdominal pain: Secondary | ICD-10-CM | POA: Insufficient documentation

## 2010-09-21 DIAGNOSIS — E119 Type 2 diabetes mellitus without complications: Secondary | ICD-10-CM | POA: Insufficient documentation

## 2010-09-21 DIAGNOSIS — R112 Nausea with vomiting, unspecified: Secondary | ICD-10-CM | POA: Insufficient documentation

## 2010-09-21 DIAGNOSIS — Z79899 Other long term (current) drug therapy: Secondary | ICD-10-CM | POA: Insufficient documentation

## 2010-09-21 LAB — BASIC METABOLIC PANEL
BUN: 13 mg/dL (ref 6–23)
CO2: 29 mEq/L (ref 19–32)
Calcium: 9.6 mg/dL (ref 8.4–10.5)
Chloride: 97 mEq/L (ref 96–112)
Creatinine, Ser: 1.92 mg/dL — ABNORMAL HIGH (ref 0.4–1.2)
GFR calc Af Amer: 33 mL/min — ABNORMAL LOW (ref >60)
GFR calc non Af Amer: 28 mL/min — ABNORMAL LOW (ref >60)
Glucose, Bld: 212 mg/dL — ABNORMAL HIGH (ref 70–99)
Potassium: 3.8 mEq/L (ref 3.5–5.1)
Sodium: 138 mEq/L (ref 135–145)

## 2010-09-21 LAB — DIFFERENTIAL
Basophils Absolute: 0 10*3/uL (ref 0.0–0.1)
Basophils Relative: 1 % (ref 0–1)
Eosinophils Absolute: 0.2 10*3/uL (ref 0.0–0.7)
Eosinophils Relative: 2 % (ref 0–5)
Lymphocytes Relative: 33 % (ref 12–46)
Lymphs Abs: 2.5 10*3/uL (ref 0.7–4.0)
Monocytes Absolute: 0.5 10*3/uL (ref 0.1–1.0)
Monocytes Relative: 6 % (ref 3–12)
Neutro Abs: 4.4 10*3/uL (ref 1.7–7.7)
Neutrophils Relative %: 58 % (ref 43–77)

## 2010-09-21 LAB — CBC
HCT: 34.4 % — ABNORMAL LOW (ref 36.0–46.0)
Hemoglobin: 11.4 g/dL — ABNORMAL LOW (ref 12.0–15.0)
MCH: 27.6 pg (ref 26.0–34.0)
MCHC: 33.1 g/dL (ref 30.0–36.0)
MCV: 83.3 fL (ref 78.0–100.0)
Platelets: 342 10*3/uL (ref 150–400)
RBC: 4.13 MIL/uL (ref 3.87–5.11)
RDW: 13.5 % (ref 11.5–15.5)
WBC: 7.6 10*3/uL (ref 4.0–10.5)

## 2010-09-21 LAB — URINALYSIS, ROUTINE W REFLEX MICROSCOPIC
Glucose, UA: NEGATIVE mg/dL
Hgb urine dipstick: NEGATIVE
Ketones, ur: 15 mg/dL — AB
Nitrite: NEGATIVE
Protein, ur: 100 mg/dL — AB
Specific Gravity, Urine: 1.028 (ref 1.005–1.030)
Urobilinogen, UA: 1 mg/dL (ref 0.0–1.0)
pH: 6 (ref 5.0–8.0)

## 2010-09-21 LAB — URINE MICROSCOPIC-ADD ON

## 2010-09-22 ENCOUNTER — Ambulatory Visit: Payer: Medicare Other | Admitting: Internal Medicine

## 2010-09-29 ENCOUNTER — Telehealth: Payer: Self-pay | Admitting: *Deleted

## 2010-09-29 NOTE — Telephone Encounter (Signed)
Pt called back, she is agreeable w/ the 6/21 1515 appt w/ dr Sherryll Burger

## 2010-09-29 NOTE — Telephone Encounter (Signed)
Pt calls and leaves message that she needs to be seen for kidney stones that have been causing problems recently, i attempted to call her back, cannot get in touch w/ her. i have left a message and had scheduling place her in an appt for tomorrow w / dr Sherryll Burger but if i do not hear from pt by end of day will cancel that appt.

## 2010-09-30 ENCOUNTER — Ambulatory Visit: Payer: Medicare Other | Admitting: Internal Medicine

## 2010-09-30 NOTE — Telephone Encounter (Signed)
Fine.  Thank you.

## 2010-10-12 ENCOUNTER — Encounter: Payer: Medicare Other | Admitting: Internal Medicine

## 2010-10-15 ENCOUNTER — Ambulatory Visit: Payer: Medicare Other | Admitting: Internal Medicine

## 2010-10-15 NOTE — Progress Notes (Unsigned)
  Subjective:    Patient ID: Maria [name], female    DOB: Mar 11, 1961, 50 y.o.   MRN: [id number]  HPI    Review of Systems     Objective:   Physical Exam        Assessment & Plan:

## 2010-10-22 ENCOUNTER — Ambulatory Visit: Payer: Medicare Other | Admitting: Family Medicine

## 2010-11-09 ENCOUNTER — Emergency Department (HOSPITAL_COMMUNITY): Payer: Medicare Other

## 2010-11-09 ENCOUNTER — Emergency Department (HOSPITAL_COMMUNITY)
Admission: EM | Admit: 2010-11-09 | Discharge: 2010-11-09 | Disposition: A | Discharge status: Home / Self Care | Payer: Medicare Other | Attending: Emergency Medicine | Admitting: Emergency Medicine

## 2010-11-09 DIAGNOSIS — M542 Cervicalgia: Secondary | ICD-10-CM | POA: Insufficient documentation

## 2010-11-09 DIAGNOSIS — E119 Type 2 diabetes mellitus without complications: Secondary | ICD-10-CM | POA: Insufficient documentation

## 2010-11-09 DIAGNOSIS — F411 Generalized anxiety disorder: Secondary | ICD-10-CM | POA: Insufficient documentation

## 2010-11-09 DIAGNOSIS — I10 Essential (primary) hypertension: Secondary | ICD-10-CM | POA: Insufficient documentation

## 2010-11-09 DIAGNOSIS — E669 Obesity, unspecified: Secondary | ICD-10-CM | POA: Insufficient documentation

## 2010-11-09 DIAGNOSIS — Z79899 Other long term (current) drug therapy: Secondary | ICD-10-CM | POA: Insufficient documentation

## 2010-11-09 DIAGNOSIS — F3289 Other specified depressive episodes: Secondary | ICD-10-CM | POA: Insufficient documentation

## 2010-11-09 DIAGNOSIS — M79609 Pain in unspecified limb: Secondary | ICD-10-CM | POA: Insufficient documentation

## 2010-11-09 DIAGNOSIS — F329 Major depressive disorder, single episode, unspecified: Secondary | ICD-10-CM | POA: Insufficient documentation

## 2010-12-20 ENCOUNTER — Ambulatory Visit: Payer: Medicare Other | Admitting: Family Medicine

## 2010-12-27 ENCOUNTER — Ambulatory Visit: Payer: Medicare Other | Admitting: Family Medicine

## 2010-12-28 ENCOUNTER — Emergency Department (HOSPITAL_COMMUNITY)
Admission: EM | Admit: 2010-12-28 | Discharge: 2010-12-28 | Disposition: A | Discharge status: Home / Self Care | Payer: Medicare Other | Attending: Emergency Medicine | Admitting: Emergency Medicine

## 2010-12-28 ENCOUNTER — Emergency Department (HOSPITAL_COMMUNITY): Payer: Medicare Other

## 2010-12-28 DIAGNOSIS — E119 Type 2 diabetes mellitus without complications: Secondary | ICD-10-CM | POA: Insufficient documentation

## 2010-12-28 DIAGNOSIS — R109 Unspecified abdominal pain: Secondary | ICD-10-CM | POA: Insufficient documentation

## 2010-12-28 DIAGNOSIS — Z794 Long term (current) use of insulin: Secondary | ICD-10-CM | POA: Insufficient documentation

## 2010-12-28 DIAGNOSIS — Z87442 Personal history of urinary calculi: Secondary | ICD-10-CM | POA: Insufficient documentation

## 2010-12-28 DIAGNOSIS — I10 Essential (primary) hypertension: Secondary | ICD-10-CM | POA: Insufficient documentation

## 2010-12-28 DIAGNOSIS — N390 Urinary tract infection, site not specified: Secondary | ICD-10-CM | POA: Insufficient documentation

## 2010-12-28 LAB — URINALYSIS, ROUTINE W REFLEX MICROSCOPIC
Bilirubin Urine: NEGATIVE
Glucose, UA: NEGATIVE mg/dL
Hgb urine dipstick: NEGATIVE
Nitrite: NEGATIVE
Protein, ur: NEGATIVE mg/dL
Specific Gravity, Urine: 1.033 — ABNORMAL HIGH (ref 1.005–1.030)
Urobilinogen, UA: 0.2 mg/dL (ref 0.0–1.0)
pH: 5 (ref 5.0–8.0)

## 2010-12-28 LAB — URINE MICROSCOPIC-ADD ON

## 2010-12-29 LAB — URINE CULTURE
Colony Count: 60000
Culture  Setup Time: 201209180916

## 2010-12-31 ENCOUNTER — Ambulatory Visit: Payer: Medicare Other | Admitting: Family Medicine

## 2010-12-31 LAB — CULTURE, BLOOD (ROUTINE X 2)
Culture: NO GROWTH
Culture: NO GROWTH

## 2010-12-31 LAB — COMPREHENSIVE METABOLIC PANEL
ALT: 19
AST: 26
Albumin: 3.1 — ABNORMAL LOW
Alkaline Phosphatase: 77
BUN: 11
CO2: 29
Calcium: 8.9
Chloride: 100
Creatinine, Ser: 1.07
GFR calc Af Amer: >60
GFR calc non Af Amer: 55 — ABNORMAL LOW
Glucose, Bld: 175 — ABNORMAL HIGH
Potassium: 3.5
Sodium: 138
Total Bilirubin: 0.8
Total Protein: 6.9

## 2010-12-31 LAB — POCT I-STAT CREATININE
Creatinine, Ser: 0.9
Operator id: 294511

## 2010-12-31 LAB — HEMOGLOBIN A1C
Hgb A1c MFr Bld: 7.5 — ABNORMAL HIGH
Mean Plasma Glucose: 190

## 2010-12-31 LAB — CBC
HCT: 30.4 — ABNORMAL LOW
HCT: 35.4 — ABNORMAL LOW
HCT: 37.3
Hemoglobin: 10.2 — ABNORMAL LOW
Hemoglobin: 11.8 — ABNORMAL LOW
Hemoglobin: 12.5
MCHC: 33.3
MCHC: 33.4
MCHC: 33.6
MCV: 86.7
MCV: 87.2
MCV: 87.6
Platelets: 328
Platelets: 385
Platelets: 433 — ABNORMAL HIGH
RBC: 3.47 — ABNORMAL LOW
RBC: 4.06
RBC: 4.31
RDW: 13.4
RDW: 13.5
RDW: 13.6
WBC: 11.5 — ABNORMAL HIGH
WBC: 11.7 — ABNORMAL HIGH
WBC: 7.5

## 2010-12-31 LAB — I-STAT 8, (EC8 V) (CONVERTED LAB)
Acid-Base Excess: 4 — ABNORMAL HIGH
BUN: 10
Bicarbonate: 29.1 — ABNORMAL HIGH
Chloride: 101
Glucose, Bld: 229 — ABNORMAL HIGH
HCT: 41
Hemoglobin: 13.9
Operator id: 294511
Potassium: 3.6
Sodium: 138
TCO2: 30
pCO2, Ven: 45.1
pH, Ven: 7.417 — ABNORMAL HIGH

## 2010-12-31 LAB — DIFFERENTIAL
Basophils Absolute: 0
Basophils Absolute: 0.1
Basophils Relative: 0
Basophils Relative: 1
Eosinophils Absolute: 0
Eosinophils Absolute: 0.1
Eosinophils Relative: 0
Eosinophils Relative: 1
Lymphocytes Relative: 15
Lymphocytes Relative: 24
Lymphs Abs: 1.7
Lymphs Abs: 2.7
Monocytes Absolute: 0.4
Monocytes Absolute: 0.8
Monocytes Relative: 3
Monocytes Relative: 7
Neutro Abs: 7.9 — ABNORMAL HIGH
Neutro Abs: 9.5 — ABNORMAL HIGH
Neutrophils Relative %: 68
Neutrophils Relative %: 82 — ABNORMAL HIGH

## 2010-12-31 LAB — URINE CULTURE: Colony Count: 15000

## 2010-12-31 LAB — URINE MICROSCOPIC-ADD ON

## 2010-12-31 LAB — BASIC METABOLIC PANEL
BUN: 12
CO2: 27
Calcium: 8.2 — ABNORMAL LOW
Chloride: 102
Creatinine, Ser: 0.81
GFR calc Af Amer: >60
GFR calc non Af Amer: >60
Glucose, Bld: 151 — ABNORMAL HIGH
Potassium: 3.2 — ABNORMAL LOW
Sodium: 137

## 2010-12-31 LAB — URINALYSIS, ROUTINE W REFLEX MICROSCOPIC
Bilirubin Urine: NEGATIVE
Glucose, UA: NEGATIVE
Ketones, ur: 15 — AB
Nitrite: NEGATIVE
Protein, ur: 30 — AB
Specific Gravity, Urine: 1.023
Urobilinogen, UA: 0.2
pH: 5.5

## 2010-12-31 LAB — POCT PREGNANCY, URINE
Operator id: 294511
Preg Test, Ur: NEGATIVE

## 2011-01-03 LAB — URINE MICROSCOPIC-ADD ON

## 2011-01-03 LAB — HEMOGLOBIN AND HEMATOCRIT, BLOOD
HCT: 35 — ABNORMAL LOW
Hemoglobin: 11.8 — ABNORMAL LOW

## 2011-01-03 LAB — URINALYSIS, ROUTINE W REFLEX MICROSCOPIC
Bilirubin Urine: NEGATIVE
Bilirubin Urine: NEGATIVE
Glucose, UA: NEGATIVE
Glucose, UA: NEGATIVE
Hgb urine dipstick: NEGATIVE
Ketones, ur: NEGATIVE
Ketones, ur: NEGATIVE
Leukocytes, UA: NEGATIVE
Nitrite: NEGATIVE
Nitrite: NEGATIVE
Protein, ur: 30 — AB
Protein, ur: NEGATIVE
Specific Gravity, Urine: 1.018
Specific Gravity, Urine: 1.026
Urobilinogen, UA: 0.2
Urobilinogen, UA: 0.2
pH: 5.5
pH: 6

## 2011-01-03 LAB — BASIC METABOLIC PANEL
BUN: 12
CO2: 27
Calcium: 9.5
Chloride: 103
Creatinine, Ser: 0.86
GFR calc Af Amer: >60
GFR calc non Af Amer: >60
Glucose, Bld: 255 — ABNORMAL HIGH
Potassium: 4.2
Sodium: 141

## 2011-01-03 LAB — PREGNANCY, URINE: Preg Test, Ur: NEGATIVE

## 2011-01-07 LAB — BASIC METABOLIC PANEL
BUN: 18
CO2: 31
Calcium: 8.9
Chloride: 105
Creatinine, Ser: 1.23 — ABNORMAL HIGH
GFR calc Af Amer: 56 — ABNORMAL LOW
GFR calc non Af Amer: 47 — ABNORMAL LOW
Glucose, Bld: 156 — ABNORMAL HIGH
Potassium: 4
Sodium: 143

## 2011-01-07 LAB — POCT I-STAT, CHEM 8
BUN: 18
Calcium, Ion: 1.14
Chloride: 104
Creatinine, Ser: 1.7 — ABNORMAL HIGH
Glucose, Bld: 151 — ABNORMAL HIGH
HCT: 34 — ABNORMAL LOW
Hemoglobin: 11.6 — ABNORMAL LOW
Potassium: 4
Sodium: 139
TCO2: 28

## 2011-01-07 LAB — URINALYSIS, ROUTINE W REFLEX MICROSCOPIC
Bilirubin Urine: NEGATIVE
Glucose, UA: NEGATIVE
Ketones, ur: NEGATIVE
Nitrite: NEGATIVE
Protein, ur: 100 — AB
Specific Gravity, Urine: 1.013
Urobilinogen, UA: 0.2
pH: 5.5

## 2011-01-07 LAB — HEMOGLOBIN AND HEMATOCRIT, BLOOD
HCT: 33.7 — ABNORMAL LOW
Hemoglobin: 11.1 — ABNORMAL LOW

## 2011-01-07 LAB — CBC
HCT: 34.8 — ABNORMAL LOW
Hemoglobin: 11.5 — ABNORMAL LOW
MCHC: 33
MCV: 88.6
Platelets: 368
RBC: 3.93
RDW: 13.2
WBC: 10.1

## 2011-01-07 LAB — DIFFERENTIAL
Basophils Absolute: 0
Basophils Relative: 0
Eosinophils Absolute: 0.1
Eosinophils Relative: 1
Lymphocytes Relative: 23
Lymphs Abs: 2.3
Monocytes Absolute: 0.7
Monocytes Relative: 7
Neutro Abs: 6.9
Neutrophils Relative %: 68

## 2011-01-07 LAB — GLUCOSE, CAPILLARY
Glucose-Capillary: 112 — ABNORMAL HIGH
Glucose-Capillary: 163 — ABNORMAL HIGH

## 2011-01-07 LAB — URINE MICROSCOPIC-ADD ON

## 2011-01-07 LAB — PREGNANCY, URINE
Preg Test, Ur: NEGATIVE
Preg Test, Ur: NEGATIVE

## 2011-01-11 LAB — GLUCOSE, CAPILLARY: Glucose-Capillary: 135 — ABNORMAL HIGH

## 2011-01-14 ENCOUNTER — Ambulatory Visit (INDEPENDENT_AMBULATORY_CARE_PROVIDER_SITE_OTHER): Payer: Medicare Other | Admitting: Family Medicine

## 2011-01-14 ENCOUNTER — Encounter: Payer: Self-pay | Admitting: Family Medicine

## 2011-01-14 ENCOUNTER — Telehealth: Payer: Self-pay | Admitting: Family Medicine

## 2011-01-14 VITALS — BP 145/97

## 2011-01-14 DIAGNOSIS — M7710 Lateral epicondylitis, unspecified elbow: Secondary | ICD-10-CM

## 2011-01-14 DIAGNOSIS — Z96659 Presence of unspecified artificial knee joint: Secondary | ICD-10-CM

## 2011-01-14 DIAGNOSIS — M7711 Lateral epicondylitis, right elbow: Secondary | ICD-10-CM

## 2011-01-14 MED ORDER — CARISOPRODOL 350 MG PO TABS: 350.0000 mg | ORAL_TABLET | Freq: Every day | ORAL | Status: DC

## 2011-01-14 MED ORDER — CARISOPRODOL 250 MG PO TABS: ORAL_TABLET | ORAL | Status: DC

## 2011-01-14 MED ORDER — COLCHICINE 0.6 MG PO TABS: ORAL_TABLET | ORAL | Status: DC

## 2011-01-14 NOTE — Patient Instructions (Signed)
I have refilled your gout medicin your gout medicine--I checked your last kidney function (June) and you should definitely onlytake this medicine when you have a bad flair of gout.  I also refilled the SOMA Re.e your tennis elbow. Start with the brace and the exercises and see me in 3-4 weeks. Great  To see you, congratulations on the weight loss and the new KNEES!

## 2011-01-14 NOTE — Telephone Encounter (Signed)
Pt called had wrong prescription only has generic in 350mg  at pharmacy, gave her talk about what emergency line is for and when to use it pt though in a lot of pain.  Saw in chart pt has had this dose before did send in to avoid emergency room visit.

## 2011-01-14 NOTE — Progress Notes (Signed)
  Subjective:    Patient ID: Maria [name], female    DOB: [birth year], 50 y.o.   MRN: [id number]  HPI  Pt called tonight stating that she was supposed to get the 350mg  SOMA, has had it in the past at the pharmacy she is at only have generic in 350mg , 250mg  would cost her 181 dollars wants me to try the  350, looked at chart and has had the 350 filled will give her some at this time. Told her will not do this normally and need to call PCP who is internal medicine. Pt understood.  Review of Systems     Objective:   Physical Exam        Assessment & Plan:

## 2011-01-15 ENCOUNTER — Telehealth: Payer: Self-pay | Admitting: Family Medicine

## 2011-01-15 NOTE — Telephone Encounter (Signed)
Patient called this am because pharmacy hadn't received message regarding soma rx. Still in severe pain. I called [facility] and they indeed receive this and are filling it for patient. I notified her of this.

## 2011-01-17 ENCOUNTER — Emergency Department (HOSPITAL_COMMUNITY)
Admission: EM | Admit: 2011-01-17 | Discharge: 2011-01-17 | Disposition: A | Discharge status: Home / Self Care | Payer: Medicare Other | Attending: Emergency Medicine | Admitting: Emergency Medicine

## 2011-01-17 ENCOUNTER — Emergency Department (HOSPITAL_COMMUNITY): Payer: Medicare Other

## 2011-01-17 DIAGNOSIS — Z87442 Personal history of urinary calculi: Secondary | ICD-10-CM | POA: Insufficient documentation

## 2011-01-17 DIAGNOSIS — E119 Type 2 diabetes mellitus without complications: Secondary | ICD-10-CM | POA: Insufficient documentation

## 2011-01-17 DIAGNOSIS — R109 Unspecified abdominal pain: Secondary | ICD-10-CM | POA: Insufficient documentation

## 2011-01-17 DIAGNOSIS — R350 Frequency of micturition: Secondary | ICD-10-CM | POA: Insufficient documentation

## 2011-01-17 DIAGNOSIS — M7710 Lateral epicondylitis, unspecified elbow: Secondary | ICD-10-CM | POA: Insufficient documentation

## 2011-01-17 DIAGNOSIS — R112 Nausea with vomiting, unspecified: Secondary | ICD-10-CM | POA: Insufficient documentation

## 2011-01-17 DIAGNOSIS — Z96659 Presence of unspecified artificial knee joint: Secondary | ICD-10-CM | POA: Insufficient documentation

## 2011-01-17 DIAGNOSIS — E669 Obesity, unspecified: Secondary | ICD-10-CM | POA: Insufficient documentation

## 2011-01-17 DIAGNOSIS — I10 Essential (primary) hypertension: Secondary | ICD-10-CM | POA: Insufficient documentation

## 2011-01-17 LAB — CBC
HCT: 37.4 % (ref 36.0–46.0)
Hemoglobin: 12.6 g/dL (ref 12.0–15.0)
MCH: 28.6 pg (ref 26.0–34.0)
MCHC: 33.7 g/dL (ref 30.0–36.0)
MCV: 84.8 fL (ref 78.0–100.0)
Platelets: 350 10*3/uL (ref 150–400)
RBC: 4.41 MIL/uL (ref 3.87–5.11)
RDW: 14.1 % (ref 11.5–15.5)
WBC: 10 10*3/uL (ref 4.0–10.5)

## 2011-01-17 LAB — URINALYSIS, ROUTINE W REFLEX MICROSCOPIC
Bilirubin Urine: NEGATIVE
Glucose, UA: NEGATIVE mg/dL
Hgb urine dipstick: NEGATIVE
Ketones, ur: NEGATIVE mg/dL
Leukocytes, UA: NEGATIVE
Nitrite: NEGATIVE
Protein, ur: NEGATIVE mg/dL
Specific Gravity, Urine: 1.018 (ref 1.005–1.030)
Urobilinogen, UA: 0.2 mg/dL (ref 0.0–1.0)
pH: 6 (ref 5.0–8.0)

## 2011-01-17 LAB — DIFFERENTIAL
Basophils Absolute: 0 10*3/uL (ref 0.0–0.1)
Basophils Relative: 0 % (ref 0–1)
Eosinophils Absolute: 0.1 10*3/uL (ref 0.0–0.7)
Eosinophils Relative: 1 % (ref 0–5)
Lymphocytes Relative: 33 % (ref 12–46)
Lymphs Abs: 3.3 10*3/uL (ref 0.7–4.0)
Monocytes Absolute: 0.5 10*3/uL (ref 0.1–1.0)
Monocytes Relative: 5 % (ref 3–12)
Neutro Abs: 6.1 10*3/uL (ref 1.7–7.7)
Neutrophils Relative %: 61 % (ref 43–77)

## 2011-01-17 LAB — COMPREHENSIVE METABOLIC PANEL
ALT: 7 U/L (ref 0–35)
AST: 8 U/L (ref 0–37)
Albumin: 3.2 g/dL — ABNORMAL LOW (ref 3.5–5.2)
Alkaline Phosphatase: 109 U/L (ref 39–117)
BUN: 21 mg/dL (ref 6–23)
CO2: 28 mEq/L (ref 19–32)
Calcium: 9.6 mg/dL (ref 8.4–10.5)
Chloride: 101 mEq/L (ref 96–112)
Creatinine, Ser: 0.72 mg/dL (ref 0.50–1.10)
GFR calc Af Amer: >90 mL/min (ref >90)
GFR calc non Af Amer: >90 mL/min (ref >90)
Glucose, Bld: 187 mg/dL — ABNORMAL HIGH (ref 70–99)
Potassium: 3.9 mEq/L (ref 3.5–5.1)
Sodium: 141 mEq/L (ref 135–145)
Total Bilirubin: 0.3 mg/dL (ref 0.3–1.2)
Total Protein: 7.6 g/dL (ref 6.0–8.3)

## 2011-01-17 LAB — LIPASE, BLOOD: Lipase: 34 U/L (ref 11–59)

## 2011-01-17 NOTE — Progress Notes (Signed)
  Subjective:    Patient ID: Maria [name], female    DOB: Mar 30, 1961, 50 y.o.   MRN: [id number]  HPI 6 weeks of right elbow pain. No specific injury. No prior history of problems with this elbow. No prior history of surgery on his elbow. Right hand dominant pain is 4-8/10 pain and how much she has yeast or elbow. Pain is in the lateral elbow, worse with hand and wrist use. Improves with rest. Denies numbness or tingling in the hand.  Followup bilateral knee pain. Recently had both knees done at [facility] surgical with total knee replacement. She is doing quite well. Obesity: Has lost weight since her knee replacement.  Review of Systems Denies fever, numbness or tingling in her hands.    Objective:   Physical Exam GENERAL: Well-developed female no acute distress ELBOW: Right elbow tender to palpation at the lateral portion of the extensor muscle mass. This reproduces her pain. The olecranon is nontender. There is no effusion. There is no warmth or erythema around the elbow joint. Full range of motion in flexion and extension. Grip strength in the hands this symmetrical and normal. KNEES: Well-healed midline scars bilaterally she has full extension and full flexion.  INJECTION: Patient was given informed consent, signed copy in the chart. Appropriate time out was taken. Area prepped and draped in usual sterile fashion. 1 cc of kenalog plus  2 cc of lidocaine was injected into the lateral right elbow using a(n) perpendicular approach. The patient tolerated the procedure well. There were no complications. Post procedure instructions were given.        Assessment & Plan:  #1. Lateral epicondylitis. Corticosteroid injection today. I gave her a counter force brace and a home exercise program. Follow up in 4 weeks. #2. 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 Obesity improved. I congratulated her on this.

## 2011-01-21 ENCOUNTER — Telehealth: Payer: Self-pay | Admitting: Family Medicine

## 2011-01-21 MED ORDER — CARISOPRODOL 350 MG PO TABS: 350.0000 mg | ORAL_TABLET | Freq: Every day | ORAL | Status: DC

## 2011-01-21 NOTE — Telephone Encounter (Signed)
Message copied by Nestor Ramp on Fri Jan 21, 2011  8:49 AM ------      Message from: Annita Brod      Created: Thu Jan 20, 2011 10:03 AM      Contact: [phone]                   ----- Message -----         From: Claiborne Billings         Sent: 01/20/2011   9:39 AM           To: Lillia Pauls, CMA, Denny Levy, MD            Dr. Jennette Kettle hand wrote script for 250 mg Soma x 60 tabs.  Should have been 350 mg.  Cost difference is huge (250s cost $184).  Terrilee Files wrote for 350 mg x 30 tabs.  Aanvi would like script for remaining 30 tabs.

## 2011-01-28 ENCOUNTER — Other Ambulatory Visit: Payer: Self-pay | Admitting: Family Medicine

## 2011-01-28 ENCOUNTER — Other Ambulatory Visit: Payer: Self-pay | Admitting: *Deleted

## 2011-01-28 MED ORDER — CARISOPRODOL 350 MG PO TABS: ORAL_TABLET | ORAL | Status: DC

## 2011-02-19 ENCOUNTER — Emergency Department (HOSPITAL_BASED_OUTPATIENT_CLINIC_OR_DEPARTMENT_OTHER)
Admission: EM | Admit: 2011-02-19 | Discharge: 2011-02-19 | Disposition: A | Discharge status: Home / Self Care | Payer: Medicare Other | Attending: Emergency Medicine | Admitting: Emergency Medicine

## 2011-02-19 ENCOUNTER — Emergency Department (INDEPENDENT_AMBULATORY_CARE_PROVIDER_SITE_OTHER): Payer: Medicare Other

## 2011-02-19 ENCOUNTER — Encounter (HOSPITAL_BASED_OUTPATIENT_CLINIC_OR_DEPARTMENT_OTHER): Payer: Self-pay | Admitting: *Deleted

## 2011-02-19 DIAGNOSIS — N200 Calculus of kidney: Secondary | ICD-10-CM

## 2011-02-19 DIAGNOSIS — Z79899 Other long term (current) drug therapy: Secondary | ICD-10-CM | POA: Insufficient documentation

## 2011-02-19 DIAGNOSIS — K219 Gastro-esophageal reflux disease without esophagitis: Secondary | ICD-10-CM | POA: Insufficient documentation

## 2011-02-19 DIAGNOSIS — E119 Type 2 diabetes mellitus without complications: Secondary | ICD-10-CM | POA: Insufficient documentation

## 2011-02-19 DIAGNOSIS — I10 Essential (primary) hypertension: Secondary | ICD-10-CM | POA: Insufficient documentation

## 2011-02-19 DIAGNOSIS — R109 Unspecified abdominal pain: Secondary | ICD-10-CM | POA: Insufficient documentation

## 2011-02-19 DIAGNOSIS — F341 Dysthymic disorder: Secondary | ICD-10-CM | POA: Insufficient documentation

## 2011-02-19 DIAGNOSIS — E785 Hyperlipidemia, unspecified: Secondary | ICD-10-CM | POA: Insufficient documentation

## 2011-02-19 DIAGNOSIS — D3500 Benign neoplasm of unspecified adrenal gland: Secondary | ICD-10-CM

## 2011-02-19 LAB — CBC
HCT: 36.7 % (ref 36.0–46.0)
Hemoglobin: 12 g/dL (ref 12.0–15.0)
MCH: 28 pg (ref 26.0–34.0)
MCHC: 32.7 g/dL (ref 30.0–36.0)
MCV: 85.5 fL (ref 78.0–100.0)
Platelets: 343 10*3/uL (ref 150–400)
RBC: 4.29 MIL/uL (ref 3.87–5.11)
RDW: 14.6 % (ref 11.5–15.5)
WBC: 8 10*3/uL (ref 4.0–10.5)

## 2011-02-19 LAB — URINALYSIS, ROUTINE W REFLEX MICROSCOPIC
Bilirubin Urine: NEGATIVE
Glucose, UA: NEGATIVE mg/dL
Hgb urine dipstick: NEGATIVE
Ketones, ur: NEGATIVE mg/dL
Leukocytes, UA: NEGATIVE
Nitrite: NEGATIVE
Protein, ur: NEGATIVE mg/dL
Specific Gravity, Urine: 1.023 (ref 1.005–1.030)
Urobilinogen, UA: 0.2 mg/dL (ref 0.0–1.0)
pH: 5.5 (ref 5.0–8.0)

## 2011-02-19 LAB — BASIC METABOLIC PANEL
BUN: 20 mg/dL (ref 6–23)
CO2: 30 mEq/L (ref 19–32)
Calcium: 10.2 mg/dL (ref 8.4–10.5)
Chloride: 99 mEq/L (ref 96–112)
Creatinine, Ser: 0.8 mg/dL (ref 0.50–1.10)
GFR calc Af Amer: >90 mL/min (ref >90)
GFR calc non Af Amer: 84 mL/min — ABNORMAL LOW (ref >90)
Glucose, Bld: 254 mg/dL — ABNORMAL HIGH (ref 70–99)
Potassium: 3.9 mEq/L (ref 3.5–5.1)
Sodium: 139 mEq/L (ref 135–145)

## 2011-02-19 LAB — DIFFERENTIAL
Basophils Absolute: 0 10*3/uL (ref 0.0–0.1)
Basophils Relative: 0 % (ref 0–1)
Eosinophils Absolute: 0.1 10*3/uL (ref 0.0–0.7)
Eosinophils Relative: 1 % (ref 0–5)
Lymphocytes Relative: 42 % (ref 12–46)
Lymphs Abs: 3.4 10*3/uL (ref 0.7–4.0)
Monocytes Absolute: 0.5 10*3/uL (ref 0.1–1.0)
Monocytes Relative: 7 % (ref 3–12)
Neutro Abs: 4 10*3/uL (ref 1.7–7.7)
Neutrophils Relative %: 50 % (ref 43–77)

## 2011-02-19 MED ORDER — KETOROLAC TROMETHAMINE 30 MG/ML IJ SOLN
30.0000 mg | Freq: Once | INTRAMUSCULAR | Status: AC
  Administered 2011-02-19: 30 mg via INTRAVENOUS
  Filled 2011-02-19: qty 1

## 2011-02-19 MED ORDER — HYDROMORPHONE HCL PF 1 MG/ML IJ SOLN
1.0000 mg | Freq: Once | INTRAMUSCULAR | Status: AC
  Administered 2011-02-19: 1 mg via INTRAVENOUS
  Filled 2011-02-19: qty 1

## 2011-02-19 MED ORDER — OXYCODONE-ACETAMINOPHEN 5-325 MG PO TABS: 1.0000 | ORAL_TABLET | Freq: Four times a day (QID) | ORAL | Status: AC | PRN

## 2011-02-19 MED ORDER — ONDANSETRON HCL 4 MG/2ML IJ SOLN
4.0000 mg | Freq: Once | INTRAMUSCULAR | Status: AC
  Administered 2011-02-19: 4 mg via INTRAVENOUS
  Filled 2011-02-19: qty 2

## 2011-02-19 MED ORDER — SODIUM CHLORIDE 0.9 % IV BOLUS (SEPSIS)
1000.0000 mL | Freq: Once | INTRAVENOUS | Status: AC
  Administered 2011-02-19: 1000 mL via INTRAVENOUS

## 2011-02-19 NOTE — ED Notes (Signed)
Pt has hx of kidney stones and is c/o right flank pain. Nagging pain for 2 months, but suddenly increased to night.

## 2011-02-19 NOTE — ED Notes (Addendum)
rx x 1 for percocet and urine strainer given- d/c home with ride at 2235

## 2011-02-19 NOTE — ED Provider Notes (Addendum)
Scribed for Maria Sprout, MD, the patient was seen in room MH10/MH10 . This chart was scribed by Ellie Lunch.   CSN: [id number] Arrival date & time: 02/19/2011  7:46 PM   First MD Initiated Contact with Patient 02/19/11 1951      Chief Complaint  Patient presents with  . Flank Pain    (Consider location/radiation/quality/duration/timing/severity/associated sxs/prior treatment) Patient is a 50 y.o. female presenting with flank pain. The history is provided by the patient. No language interpreter was used.  Flank Pain This is a recurrent problem. The current episode started 12 to 24 hours ago. The problem occurs constantly. The problem has been gradually worsening. The symptoms are aggravated by nothing. The symptoms are relieved by nothing.  Maria [name] is a 50 y.o. female who presents to the Emergency Department complaining of right flank pain with associated nausea. Pt has had nagging pain for the past 2 months and pain became sharp and severe today. H/o kidney stones. Called urologist [facility] in [place] today.  Last kidney stone 3 months ago had Xray and [place]. Both were negative. Pt usually treats pain with Toradol and dilaudid. Lithotripsy 3 times. Years since last one. Planned lithotripsy 6/12 but she passed it.   Past Medical History  Diagnosis Date  . Hypertension   . Hyperlipidemia   . Diabetes mellitus   . Gout   . Nephrolithiasis     recurrent  . Osteoarthritis, knee   . GERD (gastroesophageal reflux disease)   . Depression   . Rotator cuff syndrome of left shoulder   . Carpal tunnel syndrome, left   . Anxiety   . DUB (dysfunctional uterine bleeding)   . Allergic rhinitis   . External hemorrhoid   . Morbid obesity   . Recurrent UTI   . CHF (congestive heart failure)     History reviewed. No pertinent past surgical history.  Family History  Problem Relation Age of Onset  . Coronary artery disease    . Hypertension    . Diabetes    .  Obesity      History  Substance Use Topics  . Smoking status: Never Smoker   . Smokeless tobacco: Not on file  . Alcohol Use: No    Review of Systems  Constitutional: Negative for fever.  Gastrointestinal: Positive for nausea. Negative for vomiting and diarrhea.  Genitourinary: Positive for flank pain.  All other systems reviewed and are negative.    Allergies  Propoxyphene n-acetaminophen  Home Medications   Current Outpatient Rx  Name Route Sig Dispense Refill  . BENAZEPRIL HCL 40 MG PO TABS Oral Take 1 tablet (40 mg total) by mouth daily. 30 tablet 11  . CARISOPRODOL 350 MG PO TABS  Take one tablet by mouth twice a day as needed for pain and spasm 60 tablet 1  . CHOLECALCIFEROL 1000 UNITS PO CAPS Oral Take 1 capsule (1,000 Units total) by mouth daily. 30 capsule 11  . GLIPIZIDE 10 MG PO TABS Oral Take 0.5 tablets (5 mg total) by mouth 2 (two) times daily. 60 tablet 11  . HYDROCHLOROTHIAZIDE 25 MG PO TABS Oral Take 1 tablet (25 mg total) by mouth daily. 30 tablet 11  . INSULIN GLARGINE 100 UNIT/ML [place] SOLN  Inject 60 units under the skin at bedtime 10 mL 12  . INSULIN PEN NEEDLE 31G X 8 MM MISC  Use as directed once daily with Lantus solostar pen 1 each 11  . METFORMIN HCL 1000 MG PO  TABS Oral Take 1 tablet (1,000 mg total) by mouth 2 (two) times daily with meals. 60 tablet 11  . METOPROLOL SUCCINATE 200 MG PO TB24 Oral Take 1 tablet (200 mg total) by mouth daily. 30 tablet 11  . PANTOPRAZOLE SODIUM 40 MG PO TBEC Oral Take 1 tablet (40 mg total) by mouth daily. 30 tablet 1  . CHLORDIAZEPOXIDE HCL 10 MG PO CAPS  Once a day for muscle spasm 30 capsule 0  . COLCHICINE 0.6 MG PO TABS  Take two tabs at beginning of gout attack and then take one a day for 3-5 more days or until gout is resolved. 30 tablet 2  . FLUCONAZOLE 150 MG PO TABS  TAKE 1 TABLET EVERY DAY FOR 3 DAYS 3 tablet 0  . GABAPENTIN 800 MG PO TABS Oral Take 1 tablet (800 mg total) by mouth 2 (two) times daily. 30 tablet  2  . PSEUDOEPHEDRINE-GUAIFENESIN [id number] MG PO TB12  1 tablet 2 times daily as needed for nasal congestion 20 each 0    BP 148/82  Pulse 96  Temp(Src) 98 F (36.7 C) (Oral)  Resp 20  Ht 5\' 8"  (1.727 m)  Wt 303 lb (137.44 kg)  BMI 46.07 kg/m2  SpO2 99%  LMP 04/11/2009  Physical Exam  Nursing note and vitals reviewed. Constitutional: She is oriented to person, place, and time. She appears well-developed and well-nourished.       Appears uncomfortable   HENT:  Head: Normocephalic and atraumatic.  Eyes: Conjunctivae are normal. No scleral icterus.  Neck: Neck supple.  Cardiovascular: Normal rate, regular rhythm and normal heart sounds.   Pulmonary/Chest: Effort normal and breath sounds normal.  Abdominal: Soft. There is tenderness. There is CVA tenderness.       Right side abdominal tenderness  Neurological: She is alert and oriented to person, place, and time.  Skin: Skin is warm and dry.  Psychiatric: She has a normal mood and affect.    ED Course  Procedures (including critical care time) OTHER DATA REVIEWED: Nursing notes, vital signs, and past medical records reviewed.  DIAGNOSTIC STUDIES: Oxygen Saturation is 99% on room air, normal by my interpretation.    Results for orders placed during the hospital encounter of 02/19/11  CBC      Component Value Range   WBC 8.0  4.0 - 10.5 (K/uL)   RBC 4.29  3.87 - 5.11 (MIL/uL)   Hemoglobin 12.0  12.0 - 15.0 (g/dL)   HCT 96.2  95.2 - 84.1 (%)   MCV 85.5  78.0 - 100.0 (fL)   MCH 28.0  26.0 - 34.0 (pg)   MCHC 32.7  30.0 - 36.0 (g/dL)   RDW 32.4  40.1 - 02.7 (%)   Platelets 343  150 - 400 (K/uL)  DIFFERENTIAL      Component Value Range   Neutrophils Relative 50  43 - 77 (%)   Neutro Abs 4.0  1.7 - 7.7 (K/uL)   Lymphocytes Relative 42  12 - 46 (%)   Lymphs Abs 3.4  0.7 - 4.0 (K/uL)   Monocytes Relative 7  3 - 12 (%)   Monocytes Absolute 0.5  0.1 - 1.0 (K/uL)   Eosinophils Relative 1  0 - 5 (%)   Eosinophils Absolute  0.1  0.0 - 0.7 (K/uL)   Basophils Relative 0  0 - 1 (%)   Basophils Absolute 0.0  0.0 - 0.1 (K/uL)  BASIC METABOLIC PANEL      Component Value Range  Sodium 139  135 - 145 (mEq/L)   Potassium 3.9  3.5 - 5.1 (mEq/L)   Chloride 99  96 - 112 (mEq/L)   CO2 30  19 - 32 (mEq/L)   Glucose, Bld 254 (*) 70 - 99 (mg/dL)   BUN 20  6 - 23 (mg/dL)   Creatinine, Ser 1.61  0.50 - 1.10 (mg/dL)   Calcium 09.6  8.4 - 10.5 (mg/dL)   GFR calc non Af Amer 84 (*) >90 (mL/min)   GFR calc Af Amer >90  >90 (mL/min)   Ct Abdomen Pelvis Wo Contrast  02/19/2011  *RADIOLOGY REPORT*  Clinical Data: Right flank pain for 2 days; history of renal stones and lithotripsy.  CT ABDOMEN AND PELVIS WITHOUT CONTRAST  Technique:  Multidetector CT imaging of the abdomen and pelvis was performed following the standard protocol without intravenous contrast.  Comparison: CT of the abdomen and pelvis performed 09/15/2010, and renal ultrasound performed 01/17/2011  Findings: Minimal bibasilar atelectasis is noted.  The liver and spleen are unremarkable in appearance. The patient is status post cholecystectomy, with clips noted along the gallbladder fossa.  There is a 1.8 cm hypoattenuating lesion noted arising at the right adrenal gland; this remains stable from multiple prior studies, and likely reflects adrenal adenoma.  The left adrenal gland is unremarkable in appearance.  The pancreas is within normal limits.  There is a 5 mm nonobstructing stone noted near the lower pole of the right kidney, and a smaller 2 mm stone at the interpole region of the right kidney.  No obstructing gallstones are seen.  There is no evidence of hydronephrosis.  The kidneys are otherwise grossly unremarkable in appearance.  No perinephric stranding is appreciated.  No free fluid is identified.  The small bowel is unremarkable in appearance.  The stomach is within normal limits.  No acute vascular abnormalities are seen.  The appendix is normal in caliber and  contains minimal air, without evidence for appendicitis.  The colon is largely decompressed and is unremarkable in appearance.  The bladder is mildly distended and within normal limits.  The uterus is unremarkable in appearance.  No suspicious adnexal masses are seen.  No inguinal lymphadenopathy is seen.  No acute osseous abnormalities are identified.  Mild vacuum phenomenon is noted at L4-L5.  IMPRESSION:  1.  No evidence of hydronephrosis; no obstructing ureteral stones seen. 2.  Small nonobstructing stones noted within the right kidney, measuring up to 5 mm in size. 3.  1.8 cm right adrenal adenoma again noted, stable in appearance.  Original Report Authenticated By: Tonia Ghent, M.D.     ED MEDICATIONS Medications  HYDROmorphone (DILAUDID) injection 1 mg   ondansetron (ZOFRAN) injection 4 mg   ketorolac (TORADOL) 30 MG/ML injection 30 mg   sodium chloride 0.9 % bolus 1,000 mL     No diagnosis found.   MDM   Pt with symptoms consistent with kidney stone.  Hx of same and has had to have some removed at times.  Denies infectious sx, or GI symptoms.  Low concern for diverticulitis and no risk factors or history suggestive of AAA.  No hx suggestive of GU source (discharge) and otherwise pt is healthy.  Will hydrate, treat pain and ensure no infection with UA, CBC, CMP and will get stone study to further eval. 9:57 PM Pt feeling better and CBC, BMP wnl other than hyperglycemia.  CT showed a 5mm stone in the upper pole of the kidney and findings discussed with pt and  she sees [facility] and will contact them on Monday.  Will give pain control and d/c home. UA wnl without infection.  I personally performed the services described in this documentation, which was scribed in my presence.  The recorded information has been reviewed and considered.        Maria Sprout, MD 02/19/11 2159  Maria Sprout, MD 02/19/11 2220

## 2011-02-22 ENCOUNTER — Emergency Department: Payer: Self-pay | Admitting: Emergency Medicine

## 2011-03-14 ENCOUNTER — Ambulatory Visit: Payer: Medicare Other | Admitting: Family Medicine

## 2011-04-01 ENCOUNTER — Ambulatory Visit (INDEPENDENT_AMBULATORY_CARE_PROVIDER_SITE_OTHER): Payer: Medicare Other | Admitting: Family Medicine

## 2011-04-01 VITALS — BP 136/80

## 2011-04-01 DIAGNOSIS — M7710 Lateral epicondylitis, unspecified elbow: Secondary | ICD-10-CM

## 2011-04-01 DIAGNOSIS — M25519 Pain in unspecified shoulder: Secondary | ICD-10-CM

## 2011-04-01 DIAGNOSIS — M25512 Pain in left shoulder: Secondary | ICD-10-CM

## 2011-04-01 MED ORDER — CARISOPRODOL 350 MG PO TABS: ORAL_TABLET | ORAL | Status: DC

## 2011-04-01 NOTE — Progress Notes (Signed)
  Subjective:    Patient ID: Maria [name], female    DOB: [birth year], 50 y.o.   MRN: [id number]  HPI The patient comes in for followup of right elbow pain, as well as left shoulder pain.  Left shoulder pain: Worse with doing overhead activities, localized to the posterior lateral joint line. No clicking, popping, catching. Has tried some oral analgesics as well as muscle relaxers which have not yet helped.  Right elbow: Present the diagnosis of our lateral epicondylitis, injected 2.5 months ago, she had immediate benefit for approximately 2-1/2 months. She is very happy with the results. Did some of the rehabilitation exercises, as well as were the counterforce brace. She is here with recurrence of the same symptoms.   Review of Systems    no fevers, chills, night sweats, weight loss. Objective:   Physical Exam General:  Well developed, well nourished, and in no acute distress. Neuro:  Alert and oriented x3, extra-ocular muscles intact. Skin: Warm and dry. Respiratory:  Not using accessory muscles, speaking in full sentences. MSK: Left shoulder: Positive Neer's, positive Hawkins, positive empty can signs, with weakness. Negative speed, negative Yergason, negative crank, negative O'Brien. Range of motion good. Neurovascularly intact distally.  Right elbow: Tender to palpation over the lateral epicondyle. Reproduction of pain with resisted middle finger extension.  Consent obtained and verified. Time-out conducted. Noted no overlying erythema, induration, or other signs of local infection. Sterile betadine prep. Furthur cleansed with alcohol. Topical analgesic spray: Ethyl chloride. Joint: Subacromial Approached in typical fashion with: 25-gauge 1-1/2 Completed without difficulty Meds: 1 cc Depo-Medrol 40, 3 cc lidocaine 1% no epi Advised to call if fevers/chills, erythema, induration, drainage, or persistent bleeding.  Consent obtained and verified. Time-out  conducted. Noted no overlying erythema, induration, or other signs of local infection. Sterile betadine prep. Furthur cleansed with alcohol. Topical analgesic spray: Ethyl chloride. Joint: Right lateral epicondyle Approached in typical fashion with: 25-gauge 1-1/2 inch, real time ultrasound guidance used to guide the needle into the calcification in the extensor tendon, at that point approximately 25-30 puncture rate in the calcification. All this was under ultrasound guidance. Completed without difficulty Meds: 1 cc Depo-Medrol 40, 1 cc lidocaine 1% Advised to call if fevers/chills, erythema, induration, drainage, or persistent bleeding.     Assessment & Plan:   Left shoulder: Rotator cuff impingement clinically, injected today, I would like her to do formal physical therapy. We will see her back after her therapy is finished.  Right lateral epicondylitis: Injection with needling of calcifications as above. I would also like the therapist to work with her and eccentric extensor tendon rehabilitation. We will see her back after the rehabilitation.

## 2011-04-01 NOTE — Patient Instructions (Signed)
Great to meet you today, We injected your shoulder as well as your elbow. I would like you to do formal physical therapy for both the elbow and the shoulder. Come back to see [place] on an as-needed basis.

## 2011-04-16 ENCOUNTER — Encounter (HOSPITAL_COMMUNITY): Payer: Self-pay | Admitting: *Deleted

## 2011-04-16 ENCOUNTER — Emergency Department (HOSPITAL_COMMUNITY)
Admission: EM | Admit: 2011-04-16 | Discharge: 2011-04-16 | Disposition: A | Discharge status: Home / Self Care | Payer: Medicare Other | Attending: Emergency Medicine | Admitting: Emergency Medicine

## 2011-04-16 DIAGNOSIS — I10 Essential (primary) hypertension: Secondary | ICD-10-CM | POA: Insufficient documentation

## 2011-04-16 DIAGNOSIS — F341 Dysthymic disorder: Secondary | ICD-10-CM | POA: Insufficient documentation

## 2011-04-16 DIAGNOSIS — K219 Gastro-esophageal reflux disease without esophagitis: Secondary | ICD-10-CM | POA: Insufficient documentation

## 2011-04-16 DIAGNOSIS — R109 Unspecified abdominal pain: Secondary | ICD-10-CM | POA: Insufficient documentation

## 2011-04-16 DIAGNOSIS — Z79899 Other long term (current) drug therapy: Secondary | ICD-10-CM | POA: Insufficient documentation

## 2011-04-16 DIAGNOSIS — N200 Calculus of kidney: Secondary | ICD-10-CM | POA: Insufficient documentation

## 2011-04-16 DIAGNOSIS — Z8639 Personal history of other endocrine, nutritional and metabolic disease: Secondary | ICD-10-CM | POA: Insufficient documentation

## 2011-04-16 DIAGNOSIS — Z794 Long term (current) use of insulin: Secondary | ICD-10-CM | POA: Insufficient documentation

## 2011-04-16 DIAGNOSIS — E119 Type 2 diabetes mellitus without complications: Secondary | ICD-10-CM | POA: Insufficient documentation

## 2011-04-16 DIAGNOSIS — I509 Heart failure, unspecified: Secondary | ICD-10-CM | POA: Insufficient documentation

## 2011-04-16 DIAGNOSIS — E785 Hyperlipidemia, unspecified: Secondary | ICD-10-CM | POA: Insufficient documentation

## 2011-04-16 DIAGNOSIS — Z862 Personal history of diseases of the blood and blood-forming organs and certain disorders involving the immune mechanism: Secondary | ICD-10-CM | POA: Insufficient documentation

## 2011-04-16 DIAGNOSIS — M1710 Unilateral primary osteoarthritis, unspecified knee: Secondary | ICD-10-CM | POA: Insufficient documentation

## 2011-04-16 DIAGNOSIS — R110 Nausea: Secondary | ICD-10-CM | POA: Insufficient documentation

## 2011-04-16 LAB — CBC
HCT: 38.2 % (ref 36.0–46.0)
Hemoglobin: 12.7 g/dL (ref 12.0–15.0)
MCH: 28.9 pg (ref 26.0–34.0)
MCHC: 33.2 g/dL (ref 30.0–36.0)
MCV: 86.8 fL (ref 78.0–100.0)
Platelets: 313 10*3/uL (ref 150–400)
RBC: 4.4 MIL/uL (ref 3.87–5.11)
RDW: 13.8 % (ref 11.5–15.5)
WBC: 9 10*3/uL (ref 4.0–10.5)

## 2011-04-16 LAB — COMPREHENSIVE METABOLIC PANEL
ALT: 9 U/L (ref 0–35)
AST: 10 U/L (ref 0–37)
Albumin: 3.3 g/dL — ABNORMAL LOW (ref 3.5–5.2)
Alkaline Phosphatase: 107 U/L (ref 39–117)
BUN: 19 mg/dL (ref 6–23)
CO2: 26 mEq/L (ref 19–32)
Calcium: 9.3 mg/dL (ref 8.4–10.5)
Chloride: 101 mEq/L (ref 96–112)
Creatinine, Ser: 0.83 mg/dL (ref 0.50–1.10)
GFR calc Af Amer: >90 mL/min (ref >90)
GFR calc non Af Amer: 80 mL/min — ABNORMAL LOW (ref >90)
Glucose, Bld: 200 mg/dL — ABNORMAL HIGH (ref 70–99)
Potassium: 4.2 mEq/L (ref 3.5–5.1)
Sodium: 139 mEq/L (ref 135–145)
Total Bilirubin: 0.2 mg/dL — ABNORMAL LOW (ref 0.3–1.2)
Total Protein: 7.4 g/dL (ref 6.0–8.3)

## 2011-04-16 LAB — URINALYSIS, ROUTINE W REFLEX MICROSCOPIC
Bilirubin Urine: NEGATIVE
Glucose, UA: NEGATIVE mg/dL
Ketones, ur: NEGATIVE mg/dL
Leukocytes, UA: NEGATIVE
Nitrite: NEGATIVE
Protein, ur: NEGATIVE mg/dL
Specific Gravity, Urine: 1.022 (ref 1.005–1.030)
Urobilinogen, UA: 0.2 mg/dL (ref 0.0–1.0)
pH: 5.5 (ref 5.0–8.0)

## 2011-04-16 LAB — URINE MICROSCOPIC-ADD ON

## 2011-04-16 MED ORDER — ONDANSETRON HCL 4 MG/2ML IJ SOLN
4.0000 mg | Freq: Once | INTRAMUSCULAR | Status: AC
  Administered 2011-04-16: 4 mg via INTRAVENOUS
  Filled 2011-04-16: qty 2

## 2011-04-16 MED ORDER — PROMETHAZINE HCL 25 MG PO TABS: 25.0000 mg | ORAL_TABLET | Freq: Four times a day (QID) | ORAL | Status: DC | PRN

## 2011-04-16 MED ORDER — PROMETHAZINE HCL 25 MG/ML IJ SOLN
25.0000 mg | INTRAMUSCULAR | Status: AC
  Administered 2011-04-16: 25 mg via INTRAVENOUS
  Filled 2011-04-16: qty 1

## 2011-04-16 MED ORDER — HYDROMORPHONE HCL PF 1 MG/ML IJ SOLN
1.0000 mg | Freq: Once | INTRAMUSCULAR | Status: AC
  Administered 2011-04-16: 1 mg via INTRAVENOUS
  Filled 2011-04-16: qty 1

## 2011-04-16 MED ORDER — OXYCODONE-ACETAMINOPHEN 5-325 MG PO TABS: 1.0000 | ORAL_TABLET | Freq: Four times a day (QID) | ORAL | Status: AC | PRN

## 2011-04-16 NOTE — ED Notes (Signed)
Pt states RLQ abdominal pain, rating 7/10 at the present. Also states increased nausea. Provider notified. No signs of distress at the time.

## 2011-04-16 NOTE — ED Notes (Signed)
Wheelchair to triage. Pt had no further questions regarding discharge.

## 2011-04-16 NOTE — ED Provider Notes (Signed)
History     CSN: [id number]  Arrival date & time 04/16/11  0725   First MD Initiated Contact with Patient 04/16/11 [phone]      Chief Complaint  Patient presents with  . Flank Pain  . Nausea    (Consider location/radiation/quality/duration/timing/severity/associated sxs/prior treatment) HPI  Pt presents to the ED with complaints of Right flank pain. She has a large history of passing kidney stones and this feels the same. She used to see [facility], but was fired as a patient for missing two appointments. She states that she can tell that the stone is moving, however, the pain became so unbearable that she could not handle it. Denies urinary symptoms, fevers, chills. Admits to severe nausea.  Past Medical History  Diagnosis Date  . Hypertension   . Hyperlipidemia   . Diabetes mellitus   . Gout   . Nephrolithiasis     recurrent  . Osteoarthritis, knee   . GERD (gastroesophageal reflux disease)   . Depression   . Rotator cuff syndrome of left shoulder   . Carpal tunnel syndrome, left   . Anxiety   . DUB (dysfunctional uterine bleeding)   . Allergic rhinitis   . External hemorrhoid   . Morbid obesity   . Recurrent UTI   . CHF (congestive heart failure)     Past Surgical History  Procedure Date  . Lithotripsy   . Replacement total knee bilateral   . Cholecystectomy     Family History  Problem Relation Age of Onset  . Coronary artery disease    . Hypertension    . Diabetes    . Obesity      History  Substance Use Topics  . Smoking status: Never Smoker   . Smokeless tobacco: Not on file  . Alcohol Use: No    OB History    Grav Para Term Preterm Abortions TAB SAB Ect Mult Living                  Review of Systems  All other systems reviewed and are negative.    Allergies  Propoxyphene n-acetaminophen  Home Medications   Current Outpatient Rx  Name Route Sig Dispense Refill  . BENAZEPRIL HCL 40 MG PO TABS Oral Take 1 tablet (40 mg total) by  mouth daily. 30 tablet 11  . CARISOPRODOL 350 MG PO TABS  Take one tablet by mouth twice a day as needed for pain and spasm 60 tablet 5  . CHOLECALCIFEROL 1000 UNITS PO CAPS Oral Take 1 capsule (1,000 Units total) by mouth daily. 30 capsule 11  . COLCHICINE 0.6 MG PO TABS  Take two tabs at beginning of gout attack and then take one a day for 3-5 more days or until gout is resolved. 30 tablet 2  . GLIPIZIDE 10 MG PO TABS Oral Take 10 mg by mouth 2 (two) times daily.      Marland Kitchen HYDROCHLOROTHIAZIDE 25 MG PO TABS Oral Take 1 tablet (25 mg total) by mouth daily. 30 tablet 11  . INSULIN GLARGINE 100 UNIT/ML [place] SOLN  Inject 60 units under the skin at bedtime 10 mL 12  . INSULIN PEN NEEDLE 31G X 8 MM MISC  Use as directed once daily with Lantus solostar pen 1 each 11  . METFORMIN HCL 1000 MG PO TABS Oral Take 1 tablet (1,000 mg total) by mouth 2 (two) times daily with meals. 60 tablet 11  . METOPROLOL SUCCINATE ER 200 MG PO TB24 Oral Take  1 tablet (200 mg total) by mouth daily. 30 tablet 11  . PANTOPRAZOLE SODIUM 40 MG PO TBEC Oral Take 1 tablet (40 mg total) by mouth daily. 30 tablet 1    BP 113/95  Pulse 77  Temp(Src) 97.5 F (36.4 C) (Oral)  Resp 22  SpO2 100%  LMP 04/11/2009  Physical Exam  Nursing note and vitals reviewed. Constitutional: She appears well-developed and well-nourished.  HENT:  Head: Normocephalic and atraumatic.  Eyes: Conjunctivae are normal. Pupils are equal, round, and reactive to light.  Neck: Trachea normal, normal range of motion and full passive range of motion without pain. Neck supple.  Cardiovascular: Normal rate, regular rhythm and normal pulses.   Pulmonary/Chest: Effort normal and breath sounds normal. Chest wall is not dull to percussion. She exhibits no tenderness, no crepitus, no edema, no deformity and no retraction.  Abdominal: Soft. Normal appearance and bowel sounds are normal. There is generalized tenderness.    Musculoskeletal: Normal range of motion.    Neurological: She is alert. She has normal strength.  Skin: Skin is warm, dry and intact.  Psychiatric: She has a normal mood and affect. Her speech is normal and behavior is normal. Judgment and thought content normal. Cognition and memory are normal.    ED Course  Procedures (including critical care time)  Labs Reviewed  URINALYSIS, ROUTINE W REFLEX MICROSCOPIC - Abnormal; Notable for the following:    APPearance CLOUDY (*)    Hgb urine dipstick LARGE (*)    All other components within normal limits  COMPREHENSIVE METABOLIC PANEL - Abnormal; Notable for the following:    Glucose, Bld 200 (*)    Albumin 3.3 (*)    Total Bilirubin 0.2 (*)    GFR calc non Af Amer 80 (*)    All other components within normal limits  URINE MICROSCOPIC-ADD ON - Abnormal; Notable for the following:    Squamous Epithelial / LPF MANY (*)    Bacteria, UA FEW (*)    All other components within normal limits  CBC   No results found.   1. Kidney stones       MDM   Pt needs referral to Urology. Pt has history of stones and pain was well controlled with one dose of IV Dilaudid and Zofran. Will give pt urine strainer and pain medication.       Dorthula Matas, PA 04/16/11 [phone]

## 2011-04-16 NOTE — ED Notes (Signed)
Reports right flank pain that started yesterday, thinks she has a kidney stone. Having nausea, denies problems urinating.

## 2011-04-16 NOTE — Discharge Instructions (Signed)

## 2011-04-18 ENCOUNTER — Encounter (HOSPITAL_COMMUNITY): Payer: Self-pay | Admitting: Adult Health

## 2011-04-18 ENCOUNTER — Emergency Department (HOSPITAL_COMMUNITY): Payer: Medicare Other

## 2011-04-18 ENCOUNTER — Emergency Department (HOSPITAL_COMMUNITY)
Admission: EM | Admit: 2011-04-18 | Discharge: 2011-04-18 | Disposition: A | Discharge status: Home / Self Care | Payer: Medicare Other | Attending: Emergency Medicine | Admitting: Emergency Medicine

## 2011-04-18 DIAGNOSIS — I509 Heart failure, unspecified: Secondary | ICD-10-CM | POA: Insufficient documentation

## 2011-04-18 DIAGNOSIS — N23 Unspecified renal colic: Secondary | ICD-10-CM | POA: Insufficient documentation

## 2011-04-18 DIAGNOSIS — E119 Type 2 diabetes mellitus without complications: Secondary | ICD-10-CM | POA: Insufficient documentation

## 2011-04-18 DIAGNOSIS — R319 Hematuria, unspecified: Secondary | ICD-10-CM | POA: Insufficient documentation

## 2011-04-18 DIAGNOSIS — R109 Unspecified abdominal pain: Secondary | ICD-10-CM | POA: Insufficient documentation

## 2011-04-18 DIAGNOSIS — E785 Hyperlipidemia, unspecified: Secondary | ICD-10-CM | POA: Insufficient documentation

## 2011-04-18 DIAGNOSIS — Z794 Long term (current) use of insulin: Secondary | ICD-10-CM | POA: Insufficient documentation

## 2011-04-18 DIAGNOSIS — Z87440 Personal history of urinary (tract) infections: Secondary | ICD-10-CM | POA: Insufficient documentation

## 2011-04-18 DIAGNOSIS — R112 Nausea with vomiting, unspecified: Secondary | ICD-10-CM | POA: Insufficient documentation

## 2011-04-18 DIAGNOSIS — I10 Essential (primary) hypertension: Secondary | ICD-10-CM | POA: Insufficient documentation

## 2011-04-18 DIAGNOSIS — K219 Gastro-esophageal reflux disease without esophagitis: Secondary | ICD-10-CM | POA: Insufficient documentation

## 2011-04-18 LAB — URINE MICROSCOPIC-ADD ON

## 2011-04-18 LAB — CBC
HCT: 36.2 % (ref 36.0–46.0)
Hemoglobin: 11.9 g/dL — ABNORMAL LOW (ref 12.0–15.0)
MCH: 28.5 pg (ref 26.0–34.0)
MCHC: 32.9 g/dL (ref 30.0–36.0)
MCV: 86.8 fL (ref 78.0–100.0)
Platelets: 308 10*3/uL (ref 150–400)
RBC: 4.17 MIL/uL (ref 3.87–5.11)
RDW: 13.7 % (ref 11.5–15.5)
WBC: 10 10*3/uL (ref 4.0–10.5)

## 2011-04-18 LAB — URINALYSIS, ROUTINE W REFLEX MICROSCOPIC
Bilirubin Urine: NEGATIVE
Glucose, UA: NEGATIVE mg/dL
Ketones, ur: NEGATIVE mg/dL
Leukocytes, UA: NEGATIVE
Nitrite: NEGATIVE
Protein, ur: NEGATIVE mg/dL
Specific Gravity, Urine: 1.022 (ref 1.005–1.030)
Urobilinogen, UA: 0.2 mg/dL (ref 0.0–1.0)
pH: 5.5 (ref 5.0–8.0)

## 2011-04-18 LAB — POCT I-STAT, CHEM 8
BUN: 24 mg/dL — ABNORMAL HIGH (ref 6–23)
Calcium, Ion: 1.16 mmol/L (ref 1.12–1.32)
Chloride: 103 mEq/L (ref 96–112)
Creatinine, Ser: 1.2 mg/dL — ABNORMAL HIGH (ref 0.50–1.10)
Glucose, Bld: 249 mg/dL — ABNORMAL HIGH (ref 70–99)
HCT: 40 % (ref 36.0–46.0)
Hemoglobin: 13.6 g/dL (ref 12.0–15.0)
Potassium: 4 mEq/L (ref 3.5–5.1)
Sodium: 142 mEq/L (ref 135–145)
TCO2: 27 mmol/L (ref 0–100)

## 2011-04-18 LAB — DIFFERENTIAL
Basophils Absolute: 0 10*3/uL (ref 0.0–0.1)
Basophils Relative: 0 % (ref 0–1)
Eosinophils Absolute: 0 10*3/uL (ref 0.0–0.7)
Eosinophils Relative: 0 % (ref 0–5)
Lymphocytes Relative: 15 % (ref 12–46)
Lymphs Abs: 1.5 10*3/uL (ref 0.7–4.0)
Monocytes Absolute: 0.6 10*3/uL (ref 0.1–1.0)
Monocytes Relative: 6 % (ref 3–12)
Neutro Abs: 7.9 10*3/uL — ABNORMAL HIGH (ref 1.7–7.7)
Neutrophils Relative %: 79 % — ABNORMAL HIGH (ref 43–77)

## 2011-04-18 MED ORDER — HYDROMORPHONE HCL PF 1 MG/ML IJ SOLN
INTRAMUSCULAR | Status: AC
  Filled 2011-04-18: qty 1

## 2011-04-18 MED ORDER — KETOROLAC TROMETHAMINE 30 MG/ML IJ SOLN: 30.0000 mg | Freq: Once | INTRAMUSCULAR | Status: AC

## 2011-04-18 MED ORDER — KETOROLAC TROMETHAMINE 30 MG/ML IJ SOLN
30.0000 mg | Freq: Once | INTRAMUSCULAR | Status: AC
  Administered 2011-04-18: 30 mg via INTRAVENOUS
  Filled 2011-04-18: qty 1

## 2011-04-18 MED ORDER — ONDANSETRON HCL 4 MG/2ML IJ SOLN
4.0000 mg | Freq: Once | INTRAMUSCULAR | Status: AC
  Administered 2011-04-18: 4 mg via INTRAVENOUS
  Filled 2011-04-18: qty 2

## 2011-04-18 MED ORDER — HYDROMORPHONE HCL PF 1 MG/ML IJ SOLN
1.0000 mg | Freq: Once | INTRAMUSCULAR | Status: AC
  Administered 2011-04-18: 1 mg via INTRAVENOUS
  Filled 2011-04-18: qty 1

## 2011-04-18 MED ORDER — OXYCODONE-ACETAMINOPHEN 5-325 MG PO TABS: 1.0000 | ORAL_TABLET | ORAL | Status: AC | PRN

## 2011-04-18 MED ORDER — ONDANSETRON HCL 4 MG/2ML IJ SOLN
INTRAMUSCULAR | Status: AC
  Filled 2011-04-18: qty 2

## 2011-04-18 MED ORDER — HYDROMORPHONE HCL PF 1 MG/ML IJ SOLN
1.0000 mg | Freq: Once | INTRAMUSCULAR | Status: AC
  Administered 2011-04-18: 1 mg via INTRAVENOUS

## 2011-04-18 MED ORDER — NAPROXEN 500 MG PO TABS: 500.0000 mg | ORAL_TABLET | Freq: Two times a day (BID) | ORAL | Status: AC

## 2011-04-18 NOTE — ED Provider Notes (Signed)
History     CSN: [id number]  Arrival date & time 04/18/11  1326   First MD Initiated Contact with Patient 04/18/11 1339     HPI Patient reports right flank pain that began this weekend. Was seen at [facility] due to kidney stones. States she's completed her medication and has had no relief. Has not followed up with [facility]. States she was discharged from [facility] to to missing a procedure. Reports symptoms associated with blood and urine. Denies dysuria, increased frequency, urgency, fever and, diarrhea, vaginal discharge. Patient is a 51 y.o. female presenting with flank pain. The history is provided by the patient.  Flank Pain This is a new problem. The current episode started in the past 7 days. The problem occurs constantly. Associated symptoms include abdominal pain, nausea and vomiting. Pertinent negatives include no chest pain, chills or fever.    Past Medical History  Diagnosis Date  . Hypertension   . Hyperlipidemia   . Diabetes mellitus   . Gout   . Nephrolithiasis     recurrent  . Osteoarthritis, knee   . GERD (gastroesophageal reflux disease)   . Depression   . Rotator cuff syndrome of left shoulder   . Carpal tunnel syndrome, left   . Anxiety   . DUB (dysfunctional uterine bleeding)   . Allergic rhinitis   . External hemorrhoid   . Morbid obesity   . Recurrent UTI   . CHF (congestive heart failure)     Past Surgical History  Procedure Date  . Lithotripsy   . Replacement total knee bilateral   . Cholecystectomy     Family History  Problem Relation Age of Onset  . Coronary artery disease    . Hypertension    . Diabetes    . Obesity      History  Substance Use Topics  . Smoking status: Never Smoker   . Smokeless tobacco: Not on file  . Alcohol Use: No    OB History    Grav Para Term Preterm Abortions TAB SAB Ect Mult Living                  Review of Systems  Constitutional: Negative for fever and chills.  Respiratory: Negative for  shortness of breath.   Cardiovascular: Negative for chest pain.  Gastrointestinal: Positive for nausea, vomiting and abdominal pain. Negative for diarrhea and constipation.  Genitourinary: Positive for hematuria and flank pain. Negative for dysuria, urgency, frequency, vaginal discharge and vaginal pain.  Musculoskeletal: Positive for back pain.  All other systems reviewed and are negative.    Allergies  Propoxyphene n-acetaminophen  Home Medications   Current Outpatient Rx  Name Route Sig Dispense Refill  . BENAZEPRIL HCL 40 MG PO TABS Oral Take 1 tablet (40 mg total) by mouth daily. 30 tablet 11  . CARISOPRODOL 350 MG PO TABS  Take one tablet by mouth twice a day as needed for pain and spasm 60 tablet 5  . CHOLECALCIFEROL 1000 UNITS PO CAPS Oral Take 1 capsule (1,000 Units total) by mouth daily. 30 capsule 11  . GLIPIZIDE 5 MG PO TABS Oral Take 5 mg by mouth 2 (two) times daily.      Marland Kitchen HYDROCHLOROTHIAZIDE 25 MG PO TABS Oral Take 1 tablet (25 mg total) by mouth daily. 30 tablet 11  . INSULIN GLARGINE 100 UNIT/ML [place] SOLN  Inject 60 units under the skin at bedtime 10 mL 12  . METFORMIN HCL 1000 MG PO TABS Oral Take  1 tablet (1,000 mg total) by mouth 2 (two) times daily with meals. 60 tablet 11  . METOPROLOL SUCCINATE ER 200 MG PO TB24 Oral Take 1 tablet (200 mg total) by mouth daily. 30 tablet 11  . OXYCODONE-ACETAMINOPHEN 5-325 MG PO TABS Oral Take 1 tablet by mouth every 6 (six) hours as needed for pain. 15 tablet 0  . PANTOPRAZOLE SODIUM 40 MG PO TBEC Oral Take 1 tablet (40 mg total) by mouth daily. 30 tablet 1  . PROMETHAZINE HCL 25 MG PO TABS Oral Take 1 tablet (25 mg total) by mouth every 6 (six) hours as needed for nausea. 30 tablet 0  . INSULIN PEN NEEDLE 31G X 8 MM MISC  Use as directed once daily with Lantus solostar pen 1 each 11    BP 132/54  Pulse 65  Temp(Src) 97.5 F (36.4 C) (Oral)  Resp 16  SpO2 95%  LMP 04/11/2009  Physical Exam  Vitals  reviewed. Constitutional: She is oriented to person, place, and time. Vital signs are normal. She appears well-developed and well-nourished.  HENT:  Head: Normocephalic and atraumatic.  Eyes: Conjunctivae are normal. Pupils are equal, round, and reactive to light.  Neck: Normal range of motion. Neck supple.  Cardiovascular: Normal rate, regular rhythm and normal heart sounds.  Exam reveals no friction rub.   No murmur heard. Pulmonary/Chest: Effort normal and breath sounds normal. She has no wheezes. She has no rhonchi. She has no rales. She exhibits no tenderness.  Abdominal: Soft. Bowel sounds are normal. She exhibits no distension and no mass. There is no hepatosplenomegaly. There is no tenderness. There is no rebound, no guarding and no CVA tenderness.       Obese  Musculoskeletal: Normal range of motion.  Neurological: She is alert and oriented to person, place, and time. Coordination normal.  Skin: Skin is warm and dry. No rash noted. No erythema. No pallor.    ED Course  Procedures   MDM   D/c IVF. Spoke with Dr. Margarita Grizzle who recommends a KUB and pain management. Discussed labs and [place]. And states Patient may follow-up in the office tomorrow. Discussed this with patient and family who agree with plan  7:40 PM  KUB was negative for pain. Pt reports pain signifcantly improved. Pt reports she will f/u with [facility] tomorrow as discussed with Dr. Margarita Grizzle.      Thomasene Lot, [place] 04/18/11 1941

## 2011-04-18 NOTE — ED Notes (Signed)
Pt seen this weekend at [facility] for kidney stones. Ran out of pain medications, pt has been vomitting all day. Given zofran 4 mg by EMS.

## 2011-04-18 NOTE — ED Notes (Signed)
Patient transported to Ultrasound 

## 2011-04-18 NOTE — ED Notes (Signed)
ZOX:WRUEA<VW> Expected date:04/18/11<BR> Expected time: 1:24 PM<BR> Means of arrival:Ambulance<BR> Comments:<BR> EMS [address] - kidney stones

## 2011-04-18 NOTE — ED Notes (Signed)
Brought pt's friend graham crackers and sprite.

## 2011-04-19 NOTE — ED Provider Notes (Signed)
Medical screening examination/treatment/procedure(s) were performed by non-physician practitioner and as supervising physician I was immediately available for consultation/collaboration.  Ethan Clayburn, MD 04/19/11 0803 

## 2011-04-22 NOTE — ED Provider Notes (Signed)
History/physical exam/procedure(s) were performed by non-physician practitioner and as supervising physician I was immediately available for consultation/collaboration. I have reviewed all notes and am in agreement with care and plan.   Hilario Quarry, MD 04/22/11 0730

## 2011-05-04 ENCOUNTER — Ambulatory Visit: Payer: Medicare Other | Attending: Sports Medicine | Admitting: Physical Therapy

## 2011-06-15 ENCOUNTER — Ambulatory Visit: Payer: Medicare Other | Admitting: Family Medicine

## 2011-06-22 ENCOUNTER — Ambulatory Visit (INDEPENDENT_AMBULATORY_CARE_PROVIDER_SITE_OTHER): Payer: Medicare Other | Admitting: Family Medicine

## 2011-06-22 VITALS — BP 123/80

## 2011-06-22 DIAGNOSIS — M75100 Unspecified rotator cuff tear or rupture of unspecified shoulder, not specified as traumatic: Secondary | ICD-10-CM

## 2011-06-22 DIAGNOSIS — M25511 Pain in right shoulder: Secondary | ICD-10-CM

## 2011-06-22 DIAGNOSIS — M25519 Pain in unspecified shoulder: Secondary | ICD-10-CM

## 2011-06-22 DIAGNOSIS — M67919 Unspecified disorder of synovium and tendon, unspecified shoulder: Secondary | ICD-10-CM

## 2011-06-22 MED ORDER — DIAZEPAM 5 MG PO TABS: ORAL_TABLET | ORAL | Status: DC

## 2011-06-22 NOTE — Patient Instructions (Signed)
Your MRI is scheduled for Sunday March 17th at 7:30 pm at [address] at [facility].  Physical Therapy will be callling you this week or next week regarding your PT appt. Their number is [phone] if you dont hear from them by Monday.

## 2011-06-23 ENCOUNTER — Other Ambulatory Visit: Payer: Self-pay | Admitting: *Deleted

## 2011-06-23 MED ORDER — CARISOPRODOL 350 MG PO TABS: 350.0000 mg | ORAL_TABLET | Freq: Three times a day (TID) | ORAL | Status: DC | PRN

## 2011-06-23 NOTE — Progress Notes (Signed)
  Subjective:    Patient ID: Maria [name], female    DOB: [birth year], 51 y.o.   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  She was injected in December.   The pain improved but has recurred.  She did not go to physical therapy as planned because her pain improved after the injection so she thought the problem was solved.   Review of Systems     Objective:   Physical Exam  Shoulder: Inspection reveals no abnormalities, atrophy or asymmetry. Palpation is normal with no tenderness over AC joint or bicipital groove. ROM is full in all planes. Rotator cuff strength normal throughout. No signs of impingement with negative Neer and Hawkin's tests, empty can. Speeds and Yergason's tests normal. No labral pathology noted with negative Obrien's, negative clunk and good stability. Normal scapular function observed. No painful arc and no drop arm sign. No apprehension sign  Consent obtained and verified. Cleansed with alcohol. Topical analgesic spray: Ethyl chloride. Joint: right shoulder Approached in typical fashion from the posterior aspect Completed without difficulty Meds:40 mg depo medrol Needle: 25 g Aftercare instructions and Red flags advised.      Assessment & Plan:

## 2011-06-23 NOTE — Progress Notes (Signed)
This refill was done by Dr. Jennette Kettle for Dr. Laural Benes since Dr. Laural Benes was not in-house.

## 2011-06-26 ENCOUNTER — Inpatient Hospital Stay: Admission: RE | Admit: 2011-06-26 | Payer: Medicare Other | Source: Ambulatory Visit

## 2011-06-29 DIAGNOSIS — M75100 Unspecified rotator cuff tear or rupture of unspecified shoulder, not specified as traumatic: Secondary | ICD-10-CM | POA: Insufficient documentation

## 2011-06-29 MED ORDER — METHYLPREDNISOLONE ACETATE 40 MG/ML IJ SUSP: 40.0000 mg | Freq: Once | INTRAMUSCULAR | Status: DC

## 2011-06-29 NOTE — Assessment & Plan Note (Signed)
Her shoulder was injected again today since she got relief and a PT consult was entered.  She understands that even if she doesn't have a tear that her symptoms will likely continue if she doesn't follow through with physical therapy.  We will start physical therapy while awaiting the outcome of her MRI.

## 2011-08-02 ENCOUNTER — Emergency Department: Payer: Self-pay | Admitting: Emergency Medicine

## 2011-08-02 LAB — CBC
HCT: 37.5 % (ref 35.0–47.0)
HGB: 12.1 g/dL (ref 12.0–16.0)
MCH: 28.6 pg (ref 26.0–34.0)
MCHC: 32.3 g/dL (ref 32.0–36.0)
MCV: 89 fL (ref 80–100)
Platelet: 307 10*3/uL (ref 150–440)
RBC: 4.23 10*6/uL (ref 3.80–5.20)
RDW: 13.7 % (ref 11.5–14.5)
WBC: 6.3 10*3/uL (ref 3.6–11.0)

## 2011-08-02 LAB — COMPREHENSIVE METABOLIC PANEL
Albumin: 3.2 g/dL — ABNORMAL LOW (ref 3.4–5.0)
Alkaline Phosphatase: 95 U/L (ref 50–136)
Anion Gap: 8 (ref 7–16)
BUN: 18 mg/dL (ref 7–18)
Bilirubin,Total: 0.5 mg/dL (ref 0.2–1.0)
Calcium, Total: 9.2 mg/dL (ref 8.5–10.1)
Chloride: 100 mmol/L (ref 98–107)
Co2: 30 mmol/L (ref 21–32)
Creatinine: 0.84 mg/dL (ref 0.60–1.30)
EGFR (African American): >60
EGFR (Non-African Amer.): >60
Glucose: 175 mg/dL — ABNORMAL HIGH (ref 65–99)
Osmolality: 282 (ref 275–301)
Potassium: 4.1 mmol/L (ref 3.5–5.1)
SGOT(AST): 19 U/L (ref 15–37)
SGPT (ALT): 18 U/L
Sodium: 138 mmol/L (ref 136–145)
Total Protein: 7.4 g/dL (ref 6.4–8.2)

## 2011-08-02 LAB — URINALYSIS, COMPLETE
Bilirubin,UR: NEGATIVE
Blood: NEGATIVE
Glucose,UR: NEGATIVE mg/dL (ref 0–75)
Ketone: NEGATIVE
Leukocyte Esterase: NEGATIVE
Nitrite: NEGATIVE
Ph: 6 (ref 4.5–8.0)
Protein: NEGATIVE
RBC,UR: 2 /HPF (ref 0–5)
Specific Gravity: 1.018 (ref 1.003–1.030)
Squamous Epithelial: 17
Transitional Epi: <1
WBC UR: 9 /HPF (ref 0–5)

## 2011-08-09 ENCOUNTER — Ambulatory Visit: Payer: Medicare Other | Attending: Family Medicine | Admitting: Physical Therapy

## 2011-08-16 ENCOUNTER — Other Ambulatory Visit: Payer: Self-pay | Admitting: *Deleted

## 2011-08-16 MED ORDER — CARISOPRODOL 350 MG PO TABS: 350.0000 mg | ORAL_TABLET | Freq: Three times a day (TID) | ORAL | Status: DC | PRN

## 2011-08-18 ENCOUNTER — Other Ambulatory Visit: Payer: Self-pay | Admitting: Family Medicine

## 2011-08-18 DIAGNOSIS — Z1231 Encounter for screening mammogram for malignant neoplasm of breast: Secondary | ICD-10-CM

## 2011-09-01 ENCOUNTER — Ambulatory Visit
Admission: RE | Admit: 2011-09-01 | Discharge: 2011-09-01 | Disposition: A | Discharge status: Home / Self Care | Payer: Medicare Other | Source: Ambulatory Visit | Attending: Family Medicine | Admitting: Family Medicine

## 2011-09-01 ENCOUNTER — Other Ambulatory Visit: Payer: Self-pay | Admitting: Family Medicine

## 2011-09-01 DIAGNOSIS — Z1231 Encounter for screening mammogram for malignant neoplasm of breast: Secondary | ICD-10-CM

## 2011-09-01 DIAGNOSIS — N63 Unspecified lump in unspecified breast: Secondary | ICD-10-CM

## 2011-09-07 ENCOUNTER — Ambulatory Visit
Admission: RE | Admit: 2011-09-07 | Discharge: 2011-09-07 | Disposition: A | Discharge status: Home / Self Care | Payer: Medicare Other | Source: Ambulatory Visit | Attending: Family Medicine | Admitting: Family Medicine

## 2011-09-07 ENCOUNTER — Other Ambulatory Visit: Payer: Self-pay | Admitting: Family Medicine

## 2011-09-07 ENCOUNTER — Ambulatory Visit (INDEPENDENT_AMBULATORY_CARE_PROVIDER_SITE_OTHER): Payer: Medicare Other | Admitting: Sports Medicine

## 2011-09-07 VITALS — BP 145/94

## 2011-09-07 DIAGNOSIS — N63 Unspecified lump in unspecified breast: Secondary | ICD-10-CM

## 2011-09-07 DIAGNOSIS — Z1231 Encounter for screening mammogram for malignant neoplasm of breast: Secondary | ICD-10-CM

## 2011-09-07 DIAGNOSIS — M7710 Lateral epicondylitis, unspecified elbow: Secondary | ICD-10-CM

## 2011-09-07 MED ORDER — NITROGLYCERIN 0.2 MG/HR TD PT24: MEDICATED_PATCH | TRANSDERMAL | Status: DC

## 2011-09-07 MED ORDER — MELOXICAM 15 MG PO TABS: ORAL_TABLET | ORAL | Status: AC

## 2011-09-07 NOTE — Assessment & Plan Note (Addendum)
US Guided needling and injection as above. ACE wrap. I will have her go to formal physical therapy, she agreed to do this, and we will send her to [facility] in [place], [place] which is fairly close to her house. NTG protocol. RTC 4 weeks.

## 2011-09-07 NOTE — Progress Notes (Signed)
  Subjective:    Patient ID: Maria [name], female    DOB: 05/14/1960, 51 y.o.   MRN: [id number]  HPI 51 year old presenting with right elbow pain.  She had been seen in December 2012 for this issue. She received an injection and needling of calcifications. The injection helped the pain until recently.   She denies inciting injury. She has been disabled since 2009. The pain is alleviated by ibuprofen 800 mg and holding her arm still.   Review of Systems Per HPI.  Past Medical History, Family History, and Social History reviewed.  Significant for T2DM, gout, knee osteoarthritis s/p bilateral TKR, and recent right rotator cuff tendinopathy that she reports is significantly improved.     Objective:   Physical Exam GEN: African-American; NAD PSYCH: pleasant, engaged, appropriate to questions; alert and oriented PULM: NI WOB  RIGHT elbow Ecchymosis or edema: neg ROM: full flexion, extension, pronation, supination Shoulder ROM: Full Flexion: 5/5 Extension: 5/5 Supination: 5/5  Pronation: 5/5 Wrist ext: 5/5 Wrist flexion: 5/5 No gross bony abnormality Varus and Valgus stress: stable ECRB tenderness: Positive Medial epicondyle: NT Lateral epicondyle, resisted wrist extension from wrist full pronation and flexion: exquisitely tender to palpation. grip: 5/5  Sensation intact Tinel's, Elbow: negative  Real-time Ultrasound Guided Injection of:  Ultrasound guided injection is preferred based studies that show increased duration, increased effect, greater accuracy, decreased procedural pain, increased response rate, and decreased cost with ultrasound guided versus blind injection. Verbal informed consent obtained. Time-out conducted. Noted no overlying erythema, induration, or other signs of local infection. Skin prepped in a sterile fashion. Local anesthesia: Topical Ethyl chloride. With sterile technique and under real time ultrasound guidance: needle advanced, and needling  performed origin of extensor carpi radialis brevis. Also, 1 cc Kenalog-40, 2 cc lidocaine injected during procedure. Completed without difficulty Pain immediately resolved suggesting accurate placement of the medication. Advised to call if fevers/chills, erythema, induration, drainage, or persistent bleeding. Images saved.    Assessment & Plan:

## 2011-09-17 ENCOUNTER — Emergency Department: Payer: Self-pay | Admitting: *Deleted

## 2011-09-17 LAB — URINALYSIS, COMPLETE
Bacteria: NONE SEEN
Bilirubin,UR: NEGATIVE
Glucose,UR: NEGATIVE mg/dL (ref 0–75)
Ketone: NEGATIVE
Leukocyte Esterase: NEGATIVE
Nitrite: NEGATIVE
Ph: 6 (ref 4.5–8.0)
Protein: NEGATIVE
RBC,UR: 1 /HPF (ref 0–5)
Specific Gravity: 1.019 (ref 1.003–1.030)
Squamous Epithelial: 6
WBC UR: <1 /HPF (ref 0–5)

## 2011-10-26 ENCOUNTER — Ambulatory Visit: Payer: Self-pay | Admitting: Family Medicine

## 2011-10-26 LAB — URINALYSIS, COMPLETE
Bilirubin,UR: NEGATIVE
Glucose,UR: NEGATIVE mg/dL (ref 0–75)
Ketone: NEGATIVE
Leukocyte Esterase: NEGATIVE
Nitrite: NEGATIVE
Ph: 6 (ref 4.5–8.0)
Protein: NEGATIVE
Specific Gravity: >=1.03 (ref 1.003–1.030)
WBC UR: NONE SEEN /HPF (ref 0–5)

## 2011-10-28 LAB — URINE CULTURE

## 2011-11-10 ENCOUNTER — Other Ambulatory Visit: Payer: Self-pay | Admitting: *Deleted

## 2011-11-10 MED ORDER — CARISOPRODOL 350 MG PO TABS: 350.0000 mg | ORAL_TABLET | Freq: Three times a day (TID) | ORAL | Status: DC | PRN

## 2011-11-14 ENCOUNTER — Ambulatory Visit (INDEPENDENT_AMBULATORY_CARE_PROVIDER_SITE_OTHER): Payer: Medicare Other | Admitting: Sports Medicine

## 2011-11-14 VITALS — BP 166/115

## 2011-11-14 DIAGNOSIS — M545 Low back pain, unspecified: Secondary | ICD-10-CM

## 2011-11-14 MED ORDER — KETOROLAC TROMETHAMINE 60 MG/2ML IM SOLN
60.0000 mg | Freq: Once | INTRAMUSCULAR | Status: AC
  Administered 2011-11-14: 60 mg via INTRAMUSCULAR

## 2011-11-14 NOTE — Progress Notes (Signed)
  Subjective:    Patient ID: Maria [name], female    DOB: 08-Apr-1961, 51 y.o.   MRN: [id number]  HPI chief complaint: Low back pain  Patient comes in today complaining of low back pain. She has a history of lumbar degenerative disc disease. She has had lumbar ESI's in the past with good results. She comes in today requesting repeat ESIs. She has recently undergone a CT scan of her pelvis which showed some degenerative changes in her lumbar spine. She has not had recent x-rays or MRI scans. Pain is identical in nature to what he's experienced previously. No trauma. No associated numbness or tingling. No change in bowel or bladder habits. Pain is worse with activty. Improve some at rest.    Review of Systems     Objective:   Physical Exam Obese 51 year old female in no acute distress Limited lumbar mobility secondary to body habitus. No tenderness to palpation along the lumbar midline no muscle spasm. Negative straight leg raise bilaterally negative log roll bilaterally. Reflexes 1/4 at the Achilles and patellar tendons bilaterally. Strength 5/5 both lower extremities. Sensation is intact to light touch grossly. She walks with a slight antalgic gait.       Assessment & Plan:  Low back pain secondary to lumbar degenerative disc disease Obesity Status post bilateral total knee replacements  Patient is injected with 80 mg of Depo-Medrol IM and 60 mg of Toradol IM for her acute pain. I will order an AP and lateral x-ray of her lumbar spine. After reviewing that study, I will likely proceed with an MRI scan in anticipation of referral for repeat lumbar ESI's at [facility] imaging. Phone followup after reviewing her x-rays.

## 2011-11-18 ENCOUNTER — Other Ambulatory Visit: Payer: Self-pay | Admitting: Family Medicine

## 2012-01-06 ENCOUNTER — Encounter (HOSPITAL_BASED_OUTPATIENT_CLINIC_OR_DEPARTMENT_OTHER): Payer: Self-pay | Admitting: *Deleted

## 2012-01-06 ENCOUNTER — Emergency Department (HOSPITAL_BASED_OUTPATIENT_CLINIC_OR_DEPARTMENT_OTHER)
Admission: EM | Admit: 2012-01-06 | Discharge: 2012-01-06 | Disposition: A | Discharge status: Home / Self Care | Payer: Medicare Other | Attending: Emergency Medicine | Admitting: Emergency Medicine

## 2012-01-06 DIAGNOSIS — E119 Type 2 diabetes mellitus without complications: Secondary | ICD-10-CM | POA: Insufficient documentation

## 2012-01-06 DIAGNOSIS — I10 Essential (primary) hypertension: Secondary | ICD-10-CM | POA: Insufficient documentation

## 2012-01-06 DIAGNOSIS — M109 Gout, unspecified: Secondary | ICD-10-CM | POA: Insufficient documentation

## 2012-01-06 DIAGNOSIS — Z79899 Other long term (current) drug therapy: Secondary | ICD-10-CM | POA: Insufficient documentation

## 2012-01-06 DIAGNOSIS — N200 Calculus of kidney: Secondary | ICD-10-CM

## 2012-01-06 DIAGNOSIS — I509 Heart failure, unspecified: Secondary | ICD-10-CM | POA: Insufficient documentation

## 2012-01-06 DIAGNOSIS — K219 Gastro-esophageal reflux disease without esophagitis: Secondary | ICD-10-CM | POA: Insufficient documentation

## 2012-01-06 DIAGNOSIS — E785 Hyperlipidemia, unspecified: Secondary | ICD-10-CM | POA: Insufficient documentation

## 2012-01-06 DIAGNOSIS — Z794 Long term (current) use of insulin: Secondary | ICD-10-CM | POA: Insufficient documentation

## 2012-01-06 DIAGNOSIS — R109 Unspecified abdominal pain: Secondary | ICD-10-CM | POA: Insufficient documentation

## 2012-01-06 LAB — URINALYSIS, ROUTINE W REFLEX MICROSCOPIC
Bilirubin Urine: NEGATIVE
Glucose, UA: NEGATIVE mg/dL
Hgb urine dipstick: NEGATIVE
Ketones, ur: NEGATIVE mg/dL
Leukocytes, UA: NEGATIVE
Nitrite: NEGATIVE
Protein, ur: NEGATIVE mg/dL
Specific Gravity, Urine: 1.026 (ref 1.005–1.030)
Urobilinogen, UA: 0.2 mg/dL (ref 0.0–1.0)
pH: 5.5 (ref 5.0–8.0)

## 2012-01-06 MED ORDER — KETOROLAC TROMETHAMINE 60 MG/2ML IM SOLN
60.0000 mg | Freq: Once | INTRAMUSCULAR | Status: AC
  Administered 2012-01-06: 60 mg via INTRAMUSCULAR
  Filled 2012-01-06: qty 2

## 2012-01-06 MED ORDER — OXYCODONE-ACETAMINOPHEN 5-325 MG PO TABS: 1.0000 | ORAL_TABLET | Freq: Four times a day (QID) | ORAL | Status: DC | PRN

## 2012-01-06 MED ORDER — TAMSULOSIN HCL 0.4 MG PO CAPS: 0.4000 mg | ORAL_CAPSULE | Freq: Every day | ORAL | Status: DC

## 2012-01-06 NOTE — ED Provider Notes (Signed)
History     CSN: [id number]  Arrival date & time 01/06/12  1317   First MD Initiated Contact with Patient 01/06/12 1337      Chief Complaint  Patient presents with  . Flank Pain    (Consider location/radiation/quality/duration/timing/severity/associated sxs/prior treatment) HPI Comments: Patient with history of kidney stones.  Most recently seen at urgent care in [facility] for same and had a ct scan showing a renal calculus.  She was given 10 percocet and told to follow up with urology.  She tells me she can't get in to [facility] as she has an outstanding balance.  She is continuing with pain but is out of her medications.  She denies fevers or chills.  There are no bowel complaints.    Patient is a 51 y.o. female presenting with flank pain. The history is provided by the patient.  Flank Pain This is a recurrent problem. Episode onset: 3 weeks ago. The problem occurs constantly. The problem has been gradually worsening. Associated symptoms include abdominal pain. Nothing aggravates the symptoms. Nothing relieves the symptoms. She has tried nothing for the symptoms.    Past Medical History  Diagnosis Date  . Hypertension   . Hyperlipidemia   . Diabetes mellitus   . Gout   . Nephrolithiasis     recurrent  . Osteoarthritis, knee   . GERD (gastroesophageal reflux disease)   . Depression   . Rotator cuff syndrome of left shoulder   . Carpal tunnel syndrome, left   . Anxiety   . DUB (dysfunctional uterine bleeding)   . Allergic rhinitis   . External hemorrhoid   . Morbid obesity   . Recurrent UTI   . CHF (congestive heart failure)     Past Surgical History  Procedure Date  . Lithotripsy   . Replacement total knee bilateral   . Cholecystectomy     Family History  Problem Relation Age of Onset  . Coronary artery disease    . Hypertension    . Diabetes    . Obesity      History  Substance Use Topics  . Smoking status: Never Smoker   . Smokeless tobacco: Not on  file  . Alcohol Use: No    OB History    Grav Para Term Preterm Abortions TAB SAB Ect Mult Living                  Review of Systems  Gastrointestinal: Positive for abdominal pain.  Genitourinary: Positive for flank pain.  All other systems reviewed and are negative.    Allergies  Propoxyphene-acetaminophen  Home Medications   Current Outpatient Rx  Name Route Sig Dispense Refill  . ALPRAZOLAM 1 MG PO TABS      . AMOXICILLIN 500 MG PO CAPS      . BENAZEPRIL HCL 40 MG PO TABS Oral Take 1 tablet (40 mg total) by mouth daily. 30 tablet 11  . BENAZEPRIL HCL 40 MG PO TABS      . CARISOPRODOL 350 MG PO TABS Oral Take 1 tablet (350 mg total) by mouth 3 (three) times daily as needed for muscle spasms. Take one tablet by mouth twice a day as needed for pain and spasm 90 tablet 1  . CIPROFLOXACIN HCL 500 MG PO TABS      . DIAZEPAM 5 MG PO TABS  Take one tab one hour before MRI 2 tablet 0  . FLUCONAZOLE 150 MG PO TABS      . GLIPIZIDE 5  MG PO TABS Oral Take 5 mg by mouth 2 (two) times daily.      Marland Kitchen HYDROCHLOROTHIAZIDE 25 MG PO TABS      . HYDROCHLOROTHIAZIDE 25 MG PO TABS Oral Take 1 tablet (25 mg total) by mouth daily. 30 tablet 11  . HYDROCODONE-ACETAMINOPHEN 5-325 MG PO TABS      . IBUPROFEN 800 MG PO TABS  TAKE 1 TABLET TWICE A DAY AS NEEDED FOR KNEE PAIN 60 tablet 1  . INSULIN GLARGINE 100 UNIT/ML [place] SOLN  Inject 60 units under the skin at bedtime 10 mL 12  . INSULIN PEN NEEDLE 31G X 8 MM MISC  Use as directed once daily with Lantus solostar pen 1 each 11  . MELOXICAM 15 MG PO TABS  One tab PO qAM with breakfast for 2 weeks, then daily prn pain. 30 tablet 3  . METFORMIN HCL 1000 MG PO TABS Oral Take 1 tablet (1,000 mg total) by mouth 2 (two) times daily with meals. 60 tablet 11  . METFORMIN HCL 1000 MG PO TABS      . METOPROLOL TARTRATE 100 MG PO TABS      . METOPROLOL SUCCINATE ER 200 MG PO TB24 Oral Take 1 tablet (200 mg total) by mouth daily. 30 tablet 11  . METOPROLOL  SUCCINATE ER 200 MG PO TB24      . METRONIDAZOLE 500 MG PO TABS      . NAPROXEN 500 MG PO TABS Oral Take 1 tablet (500 mg total) by mouth 2 (two) times daily. 30 tablet 0  . NITROGLYCERIN 0.2 MG/HR TD PT24  Cut and apply 1/4 patch to most painful area q24h. 30 patch 11  . OXYCODONE-ACETAMINOPHEN 10-325 MG PO TABS      . PANTOPRAZOLE SODIUM 40 MG PO TBEC Oral Take 1 tablet (40 mg total) by mouth daily. 30 tablet 1  . PANTOPRAZOLE SODIUM 40 MG PO TBEC        BP 165/100  Pulse 61  Temp 98.1 F (36.7 C) (Oral)  Resp 22  SpO2 99%  LMP 04/11/2009  Physical Exam  Nursing note and vitals reviewed. Constitutional: She is oriented to person, place, and time. She appears well-developed and well-nourished. No distress.  HENT:  Head: Normocephalic and atraumatic.  Neck: Normal range of motion. Neck supple.  Cardiovascular: Normal rate and regular rhythm.  Exam reveals no gallop and no friction rub.   No murmur heard. Pulmonary/Chest: Effort normal and breath sounds normal. No respiratory distress. She has no wheezes.  Abdominal: Soft. Bowel sounds are normal. She exhibits no distension. There is no tenderness.       The patient is morbidly obese.  There is mild cva ttp on the right.    Musculoskeletal: Normal range of motion.  Neurological: She is alert and oriented to person, place, and time.  Skin: Skin is warm and dry. She is not diaphoretic.    ED Course  Procedures (including critical care time)   Labs Reviewed  URINALYSIS, ROUTINE W REFLEX MICROSCOPIC   No results found.   No diagnosis found.    MDM  Patient with known renal calculus out of pain meds and unable to see Urologist due to financial reasons.  She appears non-toxic, afebrile.  She will be given toradol and discharged with percocet and flomax.  I see no reason to repeat a ct scan today.         Geoffery Lyons, MD 01/06/12 1355

## 2012-01-06 NOTE — ED Notes (Signed)
Right flank pain x 3 weeks. Has been referred to a urologist for same and can't see them due to lack of funds.

## 2012-01-26 ENCOUNTER — Ambulatory Visit (INDEPENDENT_AMBULATORY_CARE_PROVIDER_SITE_OTHER): Payer: Medicare Other | Admitting: Sports Medicine

## 2012-01-26 VITALS — BP 155/101 | Ht 68.0 in | Wt 298.0 lb

## 2012-01-26 DIAGNOSIS — M79609 Pain in unspecified limb: Secondary | ICD-10-CM

## 2012-01-26 DIAGNOSIS — M109 Gout, unspecified: Secondary | ICD-10-CM

## 2012-01-26 DIAGNOSIS — M79673 Pain in unspecified foot: Secondary | ICD-10-CM

## 2012-01-26 MED ORDER — PREDNISONE 10 MG PO KIT: PACK | ORAL | Status: DC

## 2012-01-26 NOTE — Progress Notes (Signed)
  Subjective:    Patient ID: Maria [name], female    DOB: 1960/10/05, 51 y.o.   MRN: [id number]  HPI chief complaint: Right foot pain  Patient comes in today complaining of 3 days of right foot pain. No trauma rather a gradual onset of pain that is quite excruciating now. She localizes all the pain along the medial aspect of her foot just distal to the ankle. She's been taking 800 mg ibuprofen but this has not been very helpful. She has a history of gout in this ankle which was treated previously with colcrys. She denies any ankle pain currently. Her pain is possibly present but worse with weightbearing. No fevers or chills.    Review of Systems     Objective:   Physical Exam Well-developed, well-nourished. No acute distress. Awake alert and oriented x3  Right foot: She has mild swelling along the course of the posterior tibialis tendon with a small area of erythema. No skin lesion. No puncture wounds. No evidence of insect bites. The area is exquisitely tender to touch and slightly warm to touch. No tenderness over the medial malleolus. No ankle effusion. Neurovascular intact distally. Walking with a significant limp.       Assessment & Plan:  1. Right foot pain-question acute gouty tendinopathy  Gout has been known to affect tendons as well as joints. With her history and her clinical findings I believe that is the case here. Patient is a diabetic but has tolerated oral prednisone in the past. I will call in a 6 day Sterapred Dosepak for her to take as directed she will hold on to it for the next few days. I want her to resume colcrys 0.6 mg twice a day for the next 48-72 hours. She can also use her ibuprofen as needed. If her pain persists despite initial colcrys, then she will take the prednisone. I've asked her to use a walker to help assist with ambulation and followup with me in one week for recheck or sooner if symptoms worsen in the interim.

## 2012-01-27 ENCOUNTER — Other Ambulatory Visit: Payer: Self-pay | Admitting: *Deleted

## 2012-01-27 MED ORDER — CARISOPRODOL 350 MG PO TABS: 350.0000 mg | ORAL_TABLET | Freq: Three times a day (TID) | ORAL | Status: DC | PRN

## 2012-02-02 ENCOUNTER — Ambulatory Visit: Payer: Medicare Other | Admitting: Sports Medicine

## 2012-03-21 ENCOUNTER — Telehealth: Payer: Self-pay | Admitting: Family Medicine

## 2012-03-21 ENCOUNTER — Ambulatory Visit (INDEPENDENT_AMBULATORY_CARE_PROVIDER_SITE_OTHER): Payer: Medicare Other | Admitting: Sports Medicine

## 2012-03-21 ENCOUNTER — Ambulatory Visit
Admission: RE | Admit: 2012-03-21 | Discharge: 2012-03-21 | Disposition: A | Discharge status: Home / Self Care | Payer: Medicare Other | Source: Ambulatory Visit | Attending: Sports Medicine | Admitting: Sports Medicine

## 2012-03-21 ENCOUNTER — Telehealth: Payer: Self-pay | Admitting: *Deleted

## 2012-03-21 VITALS — BP 138/86 | Ht 68.0 in | Wt 298.0 lb

## 2012-03-21 DIAGNOSIS — M545 Low back pain, unspecified: Secondary | ICD-10-CM

## 2012-03-21 MED ORDER — CARISOPRODOL 350 MG PO TABS: 350.0000 mg | ORAL_TABLET | Freq: Three times a day (TID) | ORAL | Status: DC | PRN

## 2012-03-21 MED ORDER — DIAZEPAM 5 MG PO TABS: ORAL_TABLET | ORAL | Status: DC

## 2012-03-21 NOTE — Telephone Encounter (Signed)
Left pt a VM to return my call  

## 2012-03-21 NOTE — Telephone Encounter (Signed)
Spoke with pt- gave her x-ray results.  Will scheduled her for MRI- she states afternoons are best for her.

## 2012-03-21 NOTE — Telephone Encounter (Signed)
Message copied by Mora Bellman on Wed Mar 21, 2012  3:47 PM ------      Message from: Reino Bellis R      Created: Wed Mar 21, 2012  3:03 PM      Regarding: xray       Please call and tell Maria [name] that her x-rays show a little degenerative disc disease but not bad. I would recommend proceeding with the MRI scan with the anticipation of referring her back to [facility] imaging for lumbar ESI's.            ----- Message -----         From: Rad Results In Interface         Sent: 03/21/2012   9:36 AM           To: Ralene Cork, DO

## 2012-03-21 NOTE — Telephone Encounter (Signed)
[facility] I have refilled her SOMA. I am NOT going to increase the dose---I think three times a day is best at her age---not QID. I also do not wanther taking this EVERY day. So I would not expect to have to give her another refill for several months .Plz let her know THANKS! Denny Levy

## 2012-03-21 NOTE — Telephone Encounter (Signed)
Was given a muscle relaxer generic and is asking for the name brand called Soma - generic she has to take more than she needs to. pls advise. [place] told her to call [facility] since she was in clinic here.  [facility] [employer]

## 2012-03-21 NOTE — Telephone Encounter (Signed)
Scheduled pt for MRI 03/26/12 @ 8:15pm.  Pt notified

## 2012-03-21 NOTE — Progress Notes (Signed)
  Subjective:    Patient ID: Maria [name], female    DOB: 12-17-1960, 51 y.o.   MRN: [id number]  HPI Patient comes in today complaining of low back pain. She was last seen in the office back in August at which time a ordered x-rays of her lumbar spine. She did not get those x-rays. Her main complaint is diffuse low back pain which is worse with standing. She's unable to stand for more than 5 minutes before her back causes her severe discomfort. No radiating pain down her legs. We had discussed the possibility of lumbar ESI at the last visit. She has had these in the past with good results. No change in bowel or bladder habits. She is taking soma on a regular basis and is requesting a refill.    Review of Systems     Objective:   Physical Exam Well-developed, well-nourished. Obese. No acute distress.  Lumbar spine: Range of motion is limited by body habitus. Negative straight leg raise bilaterally. Strength 5/5 both lower extremities grossly. Reflexes 1/4 at the Achilles and patellar reflexes bilaterally.       Assessment & Plan:  1. Returning low back pain secondary to lumbar degenerative disc disease 2. Obesity  I will reorder an AP and lateral lumbar x-ray. I will call her at [phone] after I reviewed that study. Since she has had good results with lumbar ESI's in the past at [facility] imaging, I may order an MRI scan of her lumbar spine after reviewing the x-rays in anticipation to referring her back over for another series of injections. She is requesting a refill on her SOMA. I've explained to her that she must get this from the prescribing physician, Dr. Jennette Kettle. She understands.

## 2012-03-22 NOTE — Telephone Encounter (Signed)
Precert info faxed to [facility].

## 2012-03-26 ENCOUNTER — Ambulatory Visit: Payer: Medicare Other | Admitting: Sports Medicine

## 2012-03-26 ENCOUNTER — Other Ambulatory Visit: Payer: Medicare Other

## 2012-03-27 ENCOUNTER — Other Ambulatory Visit: Payer: Self-pay | Admitting: *Deleted

## 2012-03-27 DIAGNOSIS — IMO0002 Reserved for concepts with insufficient information to code with codable children: Secondary | ICD-10-CM

## 2012-03-27 DIAGNOSIS — M1710 Unilateral primary osteoarthritis, unspecified knee: Secondary | ICD-10-CM

## 2012-03-27 DIAGNOSIS — Z96659 Presence of unspecified artificial knee joint: Secondary | ICD-10-CM

## 2012-03-27 NOTE — Progress Notes (Signed)
Please provide patient with walker with seat (rollator).  Will fax order to [facility] to provide this service at [phone] If there are questions, please feel free to call [phone]

## 2012-03-28 ENCOUNTER — Ambulatory Visit: Payer: Self-pay | Admitting: Family Medicine

## 2012-04-09 ENCOUNTER — Inpatient Hospital Stay: Admission: RE | Admit: 2012-04-09 | Payer: Medicare Other | Source: Ambulatory Visit

## 2012-04-12 ENCOUNTER — Other Ambulatory Visit: Payer: Self-pay | Admitting: *Deleted

## 2012-04-16 ENCOUNTER — Other Ambulatory Visit: Payer: Self-pay | Admitting: *Deleted

## 2012-04-16 MED ORDER — PREDNISONE 10 MG PO KIT: PACK | ORAL | Status: DC

## 2012-04-18 ENCOUNTER — Other Ambulatory Visit: Payer: Self-pay | Admitting: *Deleted

## 2012-04-18 MED ORDER — DIAZEPAM 5 MG PO TABS: ORAL_TABLET | ORAL | Status: DC

## 2012-04-23 ENCOUNTER — Emergency Department: Payer: Self-pay | Admitting: Emergency Medicine

## 2012-04-23 LAB — URINALYSIS, COMPLETE
Bacteria: NONE SEEN
Bilirubin,UR: NEGATIVE
Blood: NEGATIVE
Glucose,UR: >=500 mg/dL (ref 0–75)
Nitrite: NEGATIVE
Ph: 6 (ref 4.5–8.0)
Protein: NEGATIVE
RBC,UR: <1 /HPF (ref 0–5)
Specific Gravity: 1.022 (ref 1.003–1.030)
Squamous Epithelial: 2
WBC UR: <1 /HPF (ref 0–5)

## 2012-04-23 LAB — CBC
HCT: 38.5 % (ref 35.0–47.0)
HGB: 11.9 g/dL — ABNORMAL LOW (ref 12.0–16.0)
MCH: 27.4 pg (ref 26.0–34.0)
MCHC: 31 g/dL — ABNORMAL LOW (ref 32.0–36.0)
MCV: 88 fL (ref 80–100)
Platelet: 371 10*3/uL (ref 150–440)
RBC: 4.36 10*6/uL (ref 3.80–5.20)
RDW: 13.8 % (ref 11.5–14.5)
WBC: 7.4 10*3/uL (ref 3.6–11.0)

## 2012-04-23 LAB — BASIC METABOLIC PANEL
Anion Gap: 8 (ref 7–16)
BUN: 30 mg/dL — ABNORMAL HIGH (ref 7–18)
Calcium, Total: 8.7 mg/dL (ref 8.5–10.1)
Chloride: 103 mmol/L (ref 98–107)
Co2: 28 mmol/L (ref 21–32)
Creatinine: 1.14 mg/dL (ref 0.60–1.30)
EGFR (African American): >60
EGFR (Non-African Amer.): 55 — ABNORMAL LOW
Glucose: 290 mg/dL — ABNORMAL HIGH (ref 65–99)
Osmolality: 294 (ref 275–301)
Potassium: 5.7 mmol/L — ABNORMAL HIGH (ref 3.5–5.1)
Sodium: 139 mmol/L (ref 136–145)

## 2012-04-23 LAB — HEPATIC FUNCTION PANEL A (ARMC)
Albumin: 2.8 g/dL — ABNORMAL LOW (ref 3.4–5.0)
Alkaline Phosphatase: 107 U/L (ref 50–136)
Bilirubin, Direct: <0.1 mg/dL (ref 0.00–0.20)
Bilirubin,Total: 0.2 mg/dL (ref 0.2–1.0)
SGOT(AST): 9 U/L — ABNORMAL LOW (ref 15–37)
SGPT (ALT): 15 U/L (ref 12–78)
Total Protein: 6.6 g/dL (ref 6.4–8.2)

## 2012-04-23 LAB — LIPASE, BLOOD: Lipase: 134 U/L (ref 73–393)

## 2012-04-23 LAB — WET PREP, GENITAL

## 2012-04-23 LAB — POTASSIUM: Potassium: 4.2 mmol/L (ref 3.5–5.1)

## 2012-05-14 ENCOUNTER — Ambulatory Visit (INDEPENDENT_AMBULATORY_CARE_PROVIDER_SITE_OTHER): Payer: Medicare Other | Admitting: Family Medicine

## 2012-05-14 ENCOUNTER — Encounter: Payer: Self-pay | Admitting: Family Medicine

## 2012-05-14 VITALS — BP 152/101 | HR 77 | Ht 68.0 in | Wt 298.0 lb

## 2012-05-14 DIAGNOSIS — M109 Gout, unspecified: Secondary | ICD-10-CM

## 2012-05-14 NOTE — Patient Instructions (Addendum)
For the next THREE DAYS only, take colcrys one tab three times a day and OK to use some ibuprofen also during those three days. I would avoid ibuprofen on a regular basis. After three days start taking colcrys on a daily basis.  PLEASE see your doctor about your blood pressure---and you need some lab work to check your kidney function too. GREAT to see you!

## 2012-05-15 NOTE — Progress Notes (Signed)
  Subjective:    Patient ID: Maria [name], female    DOB: 06/29/1960, 52 y.o.   MRN: [id number]  HPI  Right foot pain specifically right heel. Feels like her gout. Start bothering her 2-3 days ago. She has had increased gout flares. Over the last 2-3 months. Does not she's increased her alcohol intake drinking one wine cooler at night and is eating a lot of shrimp and organ meats.  Review of Systems Denies unusual weight gain.    Objective:   Physical Exam Vital signs are reviewed and elevated blood pressures noted GENERAL: Well-developed overweight female no acute distress FOOT: Right. Healing great toe are both tender to palpation, warm and red. Neurovascularly intact. Gait is antalgic.       Assessment & Plan:  #1. Gout flare. Long discussion with her regarding diet restrictions. I gave her handout. I will increase her cold wrists for 3 days and then given the fact she's had so many flares I would put her on daily: Thayer Ohm. She needs to followup with her PCP regarding both her blood pressure and to get the creatinine level done as her last one is almost a year old.

## 2012-05-22 ENCOUNTER — Ambulatory Visit: Payer: Medicare Other | Admitting: Family Medicine

## 2012-06-04 ENCOUNTER — Ambulatory Visit: Payer: Medicare Other | Admitting: Family Medicine

## 2012-06-08 ENCOUNTER — Ambulatory Visit (INDEPENDENT_AMBULATORY_CARE_PROVIDER_SITE_OTHER): Payer: Medicare Other | Admitting: Family Medicine

## 2012-06-08 VITALS — BP 150/86 | HR 70 | Ht 68.0 in | Wt 298.0 lb

## 2012-06-08 DIAGNOSIS — G8929 Other chronic pain: Secondary | ICD-10-CM

## 2012-06-08 DIAGNOSIS — M25529 Pain in unspecified elbow: Secondary | ICD-10-CM

## 2012-06-11 ENCOUNTER — Ambulatory Visit: Payer: Medicare Other | Admitting: Family Medicine

## 2012-06-11 NOTE — Progress Notes (Signed)
Patient was not tight schedule and she had to leave before being seen. I was running behind by about 45 minutes. I'll schedule her back next available which is Monday she is okay with this plan

## 2012-06-16 ENCOUNTER — Telehealth: Payer: Self-pay | Admitting: Family Medicine
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
## 2012-06-18 ENCOUNTER — Ambulatory Visit (INDEPENDENT_AMBULATORY_CARE_PROVIDER_SITE_OTHER): Payer: Medicare Other | Admitting: Family Medicine

## 2012-06-18 ENCOUNTER — Encounter: Payer: Self-pay | Admitting: Family Medicine

## 2012-06-18 ENCOUNTER — Ambulatory Visit
Admission: RE | Admit: 2012-06-18 | Discharge: 2012-06-18 | Disposition: A | Discharge status: Home / Self Care | Payer: Medicare Other | Source: Ambulatory Visit | Attending: Family Medicine | Admitting: Family Medicine

## 2012-06-18 VITALS — BP 137/89 | HR 78 | Ht 68.0 in | Wt 298.0 lb

## 2012-06-18 DIAGNOSIS — M25562 Pain in left knee: Secondary | ICD-10-CM

## 2012-06-18 DIAGNOSIS — M7710 Lateral epicondylitis, unspecified elbow: Secondary | ICD-10-CM

## 2012-06-18 DIAGNOSIS — M7712 Lateral epicondylitis, left elbow: Secondary | ICD-10-CM

## 2012-06-18 DIAGNOSIS — M7711 Lateral epicondylitis, right elbow: Secondary | ICD-10-CM

## 2012-06-18 DIAGNOSIS — M25569 Pain in unspecified knee: Secondary | ICD-10-CM

## 2012-06-18 MED ORDER — CARISOPRODOL 350 MG PO TABS: 350.0000 mg | ORAL_TABLET | Freq: Four times a day (QID) | ORAL | Status: DC | PRN
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
## 2012-06-20 ENCOUNTER — Telehealth: Payer: Self-pay | Admitting: Family Medicine

## 2012-06-20 NOTE — Telephone Encounter (Signed)
[place] Please tell her the XRAY shows her knee replacement to be in fine shape. She has some bruising of surrounding tissue but hardaware is intact--looks good! Should get better onits own THANKS! Denny Levy

## 2012-06-28 NOTE — Telephone Encounter (Signed)
Pt informed

## 2012-07-19 ENCOUNTER — Other Ambulatory Visit: Payer: Self-pay | Admitting: Family Medicine

## 2012-08-06 ENCOUNTER — Ambulatory Visit: Payer: Medicare Other | Admitting: Family Medicine

## 2012-08-10 ENCOUNTER — Ambulatory Visit: Payer: Medicare Other | Admitting: Family Medicine

## 2012-08-13 ENCOUNTER — Other Ambulatory Visit: Payer: Self-pay | Admitting: *Deleted

## 2012-08-13 MED ORDER — COLCHICINE 0.6 MG PO TABS: 0.6000 mg | ORAL_TABLET | Freq: Every day | ORAL | Status: DC

## 2012-08-13 NOTE — Progress Notes (Signed)
Pt states a gout attack is beginning and she is out of colcrys.  Per last OV re: gout in Feb Dr. Jennette Kettle did tell her to take it once daily.  Sent refill of #30 tabs, no additional refills.

## 2012-08-28 ENCOUNTER — Emergency Department: Payer: Self-pay | Admitting: Emergency Medicine

## 2012-08-28 LAB — URINALYSIS, COMPLETE
Bilirubin,UR: NEGATIVE
Blood: NEGATIVE
Glucose,UR: 50 mg/dL (ref 0–75)
Nitrite: NEGATIVE
Ph: 6 (ref 4.5–8.0)
Protein: 30
RBC,UR: 6 /HPF (ref 0–5)
Specific Gravity: 1.026 (ref 1.003–1.030)
Squamous Epithelial: 58
WBC UR: 12 /HPF (ref 0–5)

## 2012-09-27 ENCOUNTER — Other Ambulatory Visit: Payer: Self-pay | Admitting: Family Medicine

## 2012-10-18 ENCOUNTER — Other Ambulatory Visit: Payer: Self-pay

## 2012-11-15 ENCOUNTER — Other Ambulatory Visit: Payer: Self-pay | Admitting: Family Medicine

## 2012-11-19 ENCOUNTER — Telehealth: Payer: Self-pay | Admitting: *Deleted

## 2012-11-19 NOTE — Telephone Encounter (Signed)
Received message on physician line, patient requesting a refill on colcrys.Busick, [name]

## 2012-11-20 ENCOUNTER — Other Ambulatory Visit: Payer: Self-pay | Admitting: Family Medicine

## 2012-11-20 MED ORDER — COLCHICINE 0.6 MG PO TABS: 0.6000 mg | ORAL_TABLET | Freq: Every day | ORAL | Status: DC

## 2012-12-07 ENCOUNTER — Ambulatory Visit: Payer: Medicare Other | Admitting: Family Medicine

## 2012-12-14 ENCOUNTER — Ambulatory Visit (INDEPENDENT_AMBULATORY_CARE_PROVIDER_SITE_OTHER): Payer: Medicare Other | Admitting: Family Medicine

## 2012-12-14 VITALS — BP 149/87 | Ht 68.0 in | Wt 298.0 lb

## 2012-12-14 DIAGNOSIS — M67919 Unspecified disorder of synovium and tendon, unspecified shoulder: Secondary | ICD-10-CM

## 2012-12-14 DIAGNOSIS — M549 Dorsalgia, unspecified: Secondary | ICD-10-CM

## 2012-12-14 DIAGNOSIS — G8929 Other chronic pain: Secondary | ICD-10-CM

## 2012-12-14 DIAGNOSIS — M7711 Lateral epicondylitis, right elbow: Secondary | ICD-10-CM

## 2012-12-14 DIAGNOSIS — M75102 Unspecified rotator cuff tear or rupture of left shoulder, not specified as traumatic: Secondary | ICD-10-CM

## 2012-12-14 DIAGNOSIS — M7710 Lateral epicondylitis, unspecified elbow: Secondary | ICD-10-CM

## 2012-12-14 DIAGNOSIS — M75101 Unspecified rotator cuff tear or rupture of right shoulder, not specified as traumatic: Secondary | ICD-10-CM

## 2012-12-14 NOTE — Progress Notes (Signed)
  Subjective:    Patient ID: Maria [name], female    DOB: 02-28-1961, 52 y.o.   MRN: [id number]  HPI Left shoulder pain. Previously responded well to corticosteroid injection but that's been over a year ago. Bother her mostly at night. Right-hand dominant. #2. Right lateral elbow pain. Particular painful with opening doors using doorknobs etc. #3. Status post TKR bilateral knees. Doing quite well. #4. Chronic back pain, diffuse. No radiculopathy. She has a lot of pain when she's been on her feet for more than about 30 minutes. She's starting to try to do some walking for weight loss his behavior but back pain. Wants to consider injection therapy   Review of Systems Denies fever, sweats, chills.    Objective:   Physical Exam Vital signs are reviewed GENERAL: Obese female no acute distress SHOULDER: Left. Full range of motion in all planes the rotator cuff. Intact strength. Distally neurovascularly intact. She does have some pain with internal rotation and supraspinatus testing on the left. KNEES: Bilaterally well healed midline incisions. She has full extension and flexion and no tenderness to palpation. No effusion. BACK: Diffusely tender in the muscular areas. No tenderness to percussion of the vertebra. 4 range of motion in flexion and extension although limited somewhat by habitus.    INJECTION: Patient was given informed consent, signed copy in the chart. Appropriate time out was taken. Area prepped and draped in usual sterile fashion. One cc of methylprednisolone 40 mg/ml plus  4 cc of 1% lidocaine without epinephrine was injected into the left subacromial bursa using a(n) posterior approach. The patient tolerated the procedure well. There were no complications. Post procedure instructions were given. INJECTION: Patient was given informed consent, signed copy in the chart. Appropriate time out was taken. Area prepped and draped in usual sterile fashion. One cc of  methylprednisolone 40 mg/ml plus  2 cc of 1% lidocaine without epinephrine was injected into the right lateral epicondylar mass of the elbow using a(n) perpendicular approach. The patient tolerated the procedure well. There were no complications. Post procedure instructions were given.     Assessment & Plan:  #1. Chronic shoulder pain. Greater than one year since we did any kind injection therapy so will do subacromial injection today. Recommend she return to her rotator cuff exercises #2. Right lateral epicondylitis. Never had this before. Very tender to palpation. She really wanted a shot so we did as well today #3. Chronic diffuse muscular back pain. Back rehabilitation program given handout form explained. Followup when necessary did tell her that injection therapy the back is not going to be useful for her. #4. Regarding bilateral TKR, she's doing extremely well

## 2013-03-11 ENCOUNTER — Other Ambulatory Visit: Payer: Self-pay | Admitting: *Deleted

## 2013-03-11 MED ORDER — CARISOPRODOL 350 MG PO TABS: 350.0000 mg | ORAL_TABLET | Freq: Four times a day (QID) | ORAL | Status: DC | PRN

## 2013-03-25 ENCOUNTER — Ambulatory Visit (INDEPENDENT_AMBULATORY_CARE_PROVIDER_SITE_OTHER): Payer: Medicare Other | Admitting: Family Medicine

## 2013-03-25 ENCOUNTER — Encounter (INDEPENDENT_AMBULATORY_CARE_PROVIDER_SITE_OTHER): Payer: Self-pay

## 2013-03-25 ENCOUNTER — Encounter: Payer: Self-pay | Admitting: Family Medicine

## 2013-03-25 VITALS — BP 160/113 | HR 80 | Ht 69.0 in | Wt 315.0 lb

## 2013-03-25 DIAGNOSIS — M25519 Pain in unspecified shoulder: Secondary | ICD-10-CM

## 2013-03-25 DIAGNOSIS — M25512 Pain in left shoulder: Secondary | ICD-10-CM

## 2013-03-25 NOTE — Patient Instructions (Signed)
You have rotator cuff tendinitis, impingement. Try to avoid painful activities (overhead activities, lifting with extended arm) as much as possible. Ibuprofen 800mg  three times a day with food for pain and inflammation. Can take tylenol in addition to this. Subacromial injection may be beneficial to help with pain and to decrease inflammation - you were given this today. Consider physical therapy with transition to home exercise program. Do home exercise program with theraband and scapular stabilization exercises daily - these are very important for long term relief even if an injection was given. Start using 1/4th of a nitro patch - change every day; put the new one in a different spot on the shoulder. If not improving at follow-up we will consider further imaging, physical therapy.

## 2013-03-28 ENCOUNTER — Encounter: Payer: Self-pay | Admitting: Family Medicine

## 2013-03-28 DIAGNOSIS — G8929 Other chronic pain: Secondary | ICD-10-CM | POA: Insufficient documentation

## 2013-03-28 DIAGNOSIS — M25512 Pain in left shoulder: Secondary | ICD-10-CM | POA: Insufficient documentation

## 2013-03-28 NOTE — Assessment & Plan Note (Signed)
2/2 rotator cuff tendinitis, impingement.  NSAIDs, avoid overhead activities and reaching.  Subacromial injection given today.  Declined physical therapy for now.  HEP shown.  Start nitro patches as well given length of time of her symptoms - discussed risks of headaches, skin irritation.  F/u in 6 weeks.  Consider PT, further imaging, increasing nitro dosage if not improving.  After informed written consent, patient was seated on exam table. Left shoulder was prepped with alcohol swab and utilizing posterior approach, patient's left subacromial space was injected with 3:1 marcaine: depomedrol. Patient tolerated the procedure well without immediate complications.

## 2013-03-28 NOTE — Progress Notes (Signed)
Patient ID: Maria [name], female   DOB: Oct 30, 1960, 52 y.o.   MRN: [id number]  PCP: No primary provider on file.  Subjective:   HPI: Patient is a 52 y.o. female here for left shoulder pain.  Patient reports she's had problems with her shoulders for about 2 years, left one especially now. No injury or trauma. Worse the past 6 months. Used to be a dispatcher - holding phones a lot. Not doing HEP and never done therapy for this. Has tried muscle relaxants. Injections helped in the past. + night pain. No numbness/tingling. Pain worse with arm motions, overhead reaching.  Past Medical History  Diagnosis Date  . Hypertension   . Hyperlipidemia   . Diabetes mellitus   . Gout   . Nephrolithiasis     recurrent  . Osteoarthritis, knee   . GERD (gastroesophageal reflux disease)   . Depression   . Rotator cuff syndrome of left shoulder   . Carpal tunnel syndrome, left   . Anxiety   . DUB (dysfunctional uterine bleeding)   . Allergic rhinitis   . External hemorrhoid   . Morbid obesity   . Recurrent UTI   . CHF (congestive heart failure)     Current Outpatient Prescriptions on File Prior to Visit  Medication Sig Dispense Refill  . ALPRAZolam (XANAX) 1 MG tablet       . amoxicillin (AMOXIL) 500 MG capsule       . benazepril (LOTENSIN) 40 MG tablet Take 1 tablet (40 mg total) by mouth daily.  30 tablet  11  . benazepril (LOTENSIN) 40 MG tablet       . carisoprodol (SOMA) 350 MG tablet Take 1 tablet (350 mg total) by mouth 4 (four) times daily as needed for muscle spasms. Take one tablet by mouth twice a day as needed for pain and spasm  120 tablet  5  . ciprofloxacin (CIPRO) 500 MG tablet       . colchicine (COLCRYS) 0.6 MG tablet Take 1 tablet (0.6 mg total) by mouth daily.  30 tablet  12  . diazepam (VALIUM) 5 MG tablet Take one tab one hour before MRI, may repeat once if needed.  2 tablet  0  . fluconazole (DIFLUCAN) 150 MG tablet       . glipiZIDE (GLUCOTROL) 5 MG tablet Take  5 mg by mouth 2 (two) times daily.        . hydrochlorothiazide (HYDRODIURIL) 25 MG tablet       . hydrochlorothiazide 25 MG tablet Take 1 tablet (25 mg total) by mouth daily.  30 tablet  11  . HYDROcodone-acetaminophen (NORCO/VICODIN) 5-325 MG per tablet       . ibuprofen (ADVIL,MOTRIN) 800 MG tablet TAKE 1 TABLET TWICE A DAY AS NEEDED FOR KNEE PAIN  60 tablet  1  . insulin glargine (LANTUS) 100 UNIT/ML injection Inject 60 units under the skin at bedtime  10 mL  12  . Insulin Pen Needle (B-D ULTRAFINE III SHORT PEN) 31G X 8 MM MISC Use as directed once daily with Lantus solostar pen  1 each  11  . metFORMIN (GLUCOPHAGE) 1000 MG tablet Take 1 tablet (1,000 mg total) by mouth 2 (two) times daily with meals.  60 tablet  11  . metFORMIN (GLUCOPHAGE) 1000 MG tablet       . metoprolol (LOPRESSOR) 100 MG tablet       . metoprolol (TOPROL XL) 200 MG 24 hr tablet Take 1 tablet (200 mg total) by mouth  daily.  30 tablet  11  . metoprolol (TOPROL-XL) 200 MG 24 hr tablet       . metroNIDAZOLE (FLAGYL) 500 MG tablet       . nitroGLYCERIN (NITRODUR - DOSED IN MG/24 HR) 0.2 mg/hr Cut and apply 1/4 patch to most painful area q24h.  30 patch  11  . oxyCODONE-acetaminophen (PERCOCET) 10-325 MG per tablet       . oxyCODONE-acetaminophen (PERCOCET/ROXICET) 5-325 MG per tablet Take 1-2 tablets by mouth every 6 (six) hours as needed for pain.  20 tablet  0  . pantoprazole (PROTONIX) 40 MG tablet Take 1 tablet (40 mg total) by mouth daily.  30 tablet  1  . pantoprazole (PROTONIX) 40 MG tablet       . PredniSONE 10 MG KIT USE AD  1 kit  0  . Tamsulosin HCl (FLOMAX) 0.4 MG CAPS Take 1 capsule (0.4 mg total) by mouth daily.  7 capsule  1   Current Facility-Administered Medications on File Prior to Visit  Medication Dose Route Frequency Provider Last Rate Last Dose  . methylPREDNISolone acetate (DEPO-MEDROL) injection 40 mg  40 mg Intra-articular Once Sherrell Puller, MD        Past Surgical History  Procedure  Laterality Date  . Lithotripsy    . Replacement total knee bilateral    . Cholecystectomy      Allergies  Allergen Reactions  . Propoxyphene-Acetaminophen Palpitations    History   Social History  . Marital Status: Legally Separated    Spouse Name: N/A    Number of Children: N/A  . Years of Education: N/A   Occupational History  . Not on file.   Social History Main Topics  . Smoking status: Never Smoker   . Smokeless tobacco: Not on file  . Alcohol Use: No  . Drug Use: No  . Sexual Activity: Not on file   Other Topics Concern  . Not on file   Social History Narrative  . No narrative on file    Family History  Problem Relation Age of Onset  . Coronary artery disease    . Hypertension    . Diabetes    . Obesity    . Diabetes Mother   . Hypertension Mother     BP 160/113  Pulse 80  Ht 5\' 9"  (1.753 m)  Wt 315 lb (142.883 kg)  BMI 46.50 kg/m2  LMP 04/11/2009  Review of Systems: See HPI above.    Objective:  Physical Exam:  Gen: NAD  Left shoulder: No swelling, ecchymoses.  No gross deformity. TTP diffusely about shoulder. Full ER.  Abduction and flexion to 90 and 100 degrees respectively. Positive Hawkins, Neers. Negative Speeds, Yergasons. Strength 5/5 with empty can and resisted internal/external rotation.  Pain with empty can. Negative apprehension. NV intact distally.    Assessment & Plan:  1. Left shoulder pain - 2/2 rotator cuff tendinitis, impingement.  NSAIDs, avoid overhead activities and reaching.  Subacromial injection given today.  Declined physical therapy for now.  HEP shown.  Start nitro patches as well given length of time of her symptoms - discussed risks of headaches, skin irritation.  F/u in 6 weeks.  Consider PT, further imaging, increasing nitro dosage if not improving.  After informed written consent, patient was seated on exam table. Left shoulder was prepped with alcohol swab and utilizing posterior approach, patient's left  subacromial space was injected with 3:1 marcaine: depomedrol. Patient tolerated the procedure well without immediate complications.

## 2013-05-12 ENCOUNTER — Encounter (HOSPITAL_BASED_OUTPATIENT_CLINIC_OR_DEPARTMENT_OTHER): Payer: Self-pay | Admitting: Emergency Medicine

## 2013-05-12 ENCOUNTER — Emergency Department (HOSPITAL_BASED_OUTPATIENT_CLINIC_OR_DEPARTMENT_OTHER): Payer: Medicare Other

## 2013-05-12 ENCOUNTER — Emergency Department (HOSPITAL_BASED_OUTPATIENT_CLINIC_OR_DEPARTMENT_OTHER)
Admission: EM | Admit: 2013-05-12 | Discharge: 2013-05-12 | Disposition: A | Discharge status: Home / Self Care | Payer: Medicare Other | Attending: Emergency Medicine | Admitting: Emergency Medicine

## 2013-05-12 DIAGNOSIS — K2990 Gastroduodenitis, unspecified, without bleeding: Principal | ICD-10-CM

## 2013-05-12 DIAGNOSIS — K219 Gastro-esophageal reflux disease without esophagitis: Secondary | ICD-10-CM | POA: Insufficient documentation

## 2013-05-12 DIAGNOSIS — Z87442 Personal history of urinary calculi: Secondary | ICD-10-CM | POA: Insufficient documentation

## 2013-05-12 DIAGNOSIS — IMO0002 Reserved for concepts with insufficient information to code with codable children: Secondary | ICD-10-CM | POA: Insufficient documentation

## 2013-05-12 DIAGNOSIS — I10 Essential (primary) hypertension: Secondary | ICD-10-CM | POA: Insufficient documentation

## 2013-05-12 DIAGNOSIS — R609 Edema, unspecified: Secondary | ICD-10-CM | POA: Insufficient documentation

## 2013-05-12 DIAGNOSIS — M1710 Unilateral primary osteoarthritis, unspecified knee: Secondary | ICD-10-CM | POA: Insufficient documentation

## 2013-05-12 DIAGNOSIS — M109 Gout, unspecified: Secondary | ICD-10-CM | POA: Insufficient documentation

## 2013-05-12 DIAGNOSIS — Z87440 Personal history of urinary (tract) infections: Secondary | ICD-10-CM | POA: Insufficient documentation

## 2013-05-12 DIAGNOSIS — R109 Unspecified abdominal pain: Secondary | ICD-10-CM

## 2013-05-12 DIAGNOSIS — F411 Generalized anxiety disorder: Secondary | ICD-10-CM | POA: Insufficient documentation

## 2013-05-12 DIAGNOSIS — F329 Major depressive disorder, single episode, unspecified: Secondary | ICD-10-CM | POA: Insufficient documentation

## 2013-05-12 DIAGNOSIS — K2970 Gastritis, unspecified, without bleeding: Secondary | ICD-10-CM

## 2013-05-12 DIAGNOSIS — Z79899 Other long term (current) drug therapy: Secondary | ICD-10-CM | POA: Insufficient documentation

## 2013-05-12 DIAGNOSIS — I509 Heart failure, unspecified: Secondary | ICD-10-CM | POA: Insufficient documentation

## 2013-05-12 DIAGNOSIS — Z794 Long term (current) use of insulin: Secondary | ICD-10-CM | POA: Insufficient documentation

## 2013-05-12 DIAGNOSIS — F3289 Other specified depressive episodes: Secondary | ICD-10-CM | POA: Insufficient documentation

## 2013-05-12 DIAGNOSIS — Z8742 Personal history of other diseases of the female genital tract: Secondary | ICD-10-CM | POA: Insufficient documentation

## 2013-05-12 DIAGNOSIS — Z8709 Personal history of other diseases of the respiratory system: Secondary | ICD-10-CM | POA: Insufficient documentation

## 2013-05-12 DIAGNOSIS — E119 Type 2 diabetes mellitus without complications: Secondary | ICD-10-CM | POA: Insufficient documentation

## 2013-05-12 DIAGNOSIS — Z9851 Tubal ligation status: Secondary | ICD-10-CM | POA: Insufficient documentation

## 2013-05-12 DIAGNOSIS — G8929 Other chronic pain: Secondary | ICD-10-CM | POA: Insufficient documentation

## 2013-05-12 DIAGNOSIS — Z9089 Acquired absence of other organs: Secondary | ICD-10-CM | POA: Insufficient documentation

## 2013-05-12 LAB — URINALYSIS, ROUTINE W REFLEX MICROSCOPIC
Bilirubin Urine: NEGATIVE
Glucose, UA: NEGATIVE mg/dL
Hgb urine dipstick: NEGATIVE
Ketones, ur: NEGATIVE mg/dL
Leukocytes, UA: NEGATIVE
Nitrite: NEGATIVE
Protein, ur: NEGATIVE mg/dL
Specific Gravity, Urine: 1.015 (ref 1.005–1.030)
Urobilinogen, UA: 0.2 mg/dL (ref 0.0–1.0)
pH: 5.5 (ref 5.0–8.0)

## 2013-05-12 LAB — COMPREHENSIVE METABOLIC PANEL
ALT: 22 U/L (ref 0–35)
AST: 25 U/L (ref 0–37)
Albumin: 3.7 g/dL (ref 3.5–5.2)
Alkaline Phosphatase: 116 U/L (ref 39–117)
BUN: 18 mg/dL (ref 6–23)
CO2: 28 mEq/L (ref 19–32)
Calcium: 9.7 mg/dL (ref 8.4–10.5)
Chloride: 101 mEq/L (ref 96–112)
Creatinine, Ser: 0.9 mg/dL (ref 0.50–1.10)
GFR calc Af Amer: 83 mL/min — ABNORMAL LOW (ref >90)
GFR calc non Af Amer: 72 mL/min — ABNORMAL LOW (ref >90)
Glucose, Bld: 178 mg/dL — ABNORMAL HIGH (ref 70–99)
Potassium: 4.4 mEq/L (ref 3.7–5.3)
Sodium: 143 mEq/L (ref 137–147)
Total Bilirubin: 0.5 mg/dL (ref 0.3–1.2)
Total Protein: 7.8 g/dL (ref 6.0–8.3)

## 2013-05-12 LAB — CBC WITH DIFFERENTIAL/PLATELET
Basophils Absolute: 0 10*3/uL (ref 0.0–0.1)
Basophils Relative: 0 % (ref 0–1)
Eosinophils Absolute: 0.1 10*3/uL (ref 0.0–0.7)
Eosinophils Relative: 2 % (ref 0–5)
HCT: 41.7 % (ref 36.0–46.0)
Hemoglobin: 13.5 g/dL (ref 12.0–15.0)
Lymphocytes Relative: 52 % — ABNORMAL HIGH (ref 12–46)
Lymphs Abs: 3.5 10*3/uL (ref 0.7–4.0)
MCH: 28.3 pg (ref 26.0–34.0)
MCHC: 32.4 g/dL (ref 30.0–36.0)
MCV: 87.4 fL (ref 78.0–100.0)
Monocytes Absolute: 0.5 10*3/uL (ref 0.1–1.0)
Monocytes Relative: 8 % (ref 3–12)
Neutro Abs: 2.6 10*3/uL (ref 1.7–7.7)
Neutrophils Relative %: 38 % — ABNORMAL LOW (ref 43–77)
Platelets: 285 10*3/uL (ref 150–400)
RBC: 4.77 MIL/uL (ref 3.87–5.11)
RDW: 13.5 % (ref 11.5–15.5)
WBC: 6.7 10*3/uL (ref 4.0–10.5)

## 2013-05-12 LAB — LIPASE, BLOOD: Lipase: 23 U/L (ref 11–59)

## 2013-05-12 LAB — D-DIMER, QUANTITATIVE: D-Dimer, Quant: 0.62 ug/mL-FEU — ABNORMAL HIGH (ref 0.00–0.48)

## 2013-05-12 MED ORDER — OMEPRAZOLE 20 MG PO CPDR: 20.0000 mg | DELAYED_RELEASE_CAPSULE | Freq: Two times a day (BID) | ORAL | Status: DC

## 2013-05-12 MED ORDER — PANTOPRAZOLE SODIUM 40 MG IV SOLR
40.0000 mg | Freq: Once | INTRAVENOUS | Status: AC
  Administered 2013-05-12: 40 mg via INTRAVENOUS
  Filled 2013-05-12: qty 40

## 2013-05-12 MED ORDER — SUCRALFATE 1 G PO TABS: 1.0000 g | ORAL_TABLET | Freq: Four times a day (QID) | ORAL | Status: DC

## 2013-05-12 MED ORDER — ONDANSETRON HCL 4 MG/2ML IJ SOLN
4.0000 mg | Freq: Once | INTRAMUSCULAR | Status: AC
  Administered 2013-05-12: 4 mg via INTRAVENOUS
  Filled 2013-05-12: qty 2

## 2013-05-12 MED ORDER — ONDANSETRON 4 MG PO TBDP: 4.0000 mg | ORAL_TABLET | Freq: Three times a day (TID) | ORAL | Status: DC | PRN

## 2013-05-12 MED ORDER — MORPHINE SULFATE 4 MG/ML IJ SOLN
4.0000 mg | INTRAMUSCULAR | Status: DC | PRN
  Administered 2013-05-12 (×2): 4 mg via INTRAVENOUS
  Filled 2013-05-12: qty 1

## 2013-05-12 MED ORDER — IOHEXOL 350 MG/ML SOLN
100.0000 mL | Freq: Once | INTRAVENOUS | Status: AC | PRN
  Administered 2013-05-12: 100 mL via INTRAVENOUS

## 2013-05-12 MED ORDER — HYDROCODONE-ACETAMINOPHEN 5-325 MG PO TABS: 2.0000 | ORAL_TABLET | ORAL | Status: DC | PRN

## 2013-05-12 MED ORDER — MORPHINE SULFATE 4 MG/ML IJ SOLN
4.0000 mg | INTRAMUSCULAR | Status: DC | PRN
  Filled 2013-05-12: qty 1

## 2013-05-12 NOTE — Discharge Instructions (Signed)
Stop the ibuprofen. Small frequent meals. Primary care followup as needed.  Abdominal Pain, Women Abdominal (stomach, pelvic, or belly) pain can be caused by many things. It is important to tell your doctor:  The location of the pain.  Does it come and go or is it present all the time?  Are there things that start the pain (eating certain foods, exercise)?  Are there other symptoms associated with the pain (fever, nausea, vomiting, diarrhea)? All of this is helpful to know when trying to find the cause of the pain. CAUSES   Stomach: virus or bacteria infection, or ulcer.  Intestine: appendicitis (inflamed appendix), regional ileitis (Crohn's disease), ulcerative colitis (inflamed colon), irritable bowel syndrome, diverticulitis (inflamed diverticulum of the colon), or cancer of the stomach or intestine.  Gallbladder disease or stones in the gallbladder.  Kidney disease, kidney stones, or infection.  Pancreas infection or cancer.  Fibromyalgia (pain disorder).  Diseases of the female organs:  Uterus: fibroid (non-cancerous) tumors or infection.  Fallopian tubes: infection or tubal pregnancy.  Ovary: cysts or tumors.  Pelvic adhesions (scar tissue).  Endometriosis (uterus lining tissue growing in the pelvis and on the pelvic organs).  Pelvic congestion syndrome (female organs filling up with blood just before the menstrual period).  Pain with the menstrual period.  Pain with ovulation (producing an egg).  Pain with an IUD (intrauterine device, birth control) in the uterus.  Cancer of the female organs.  Functional pain (pain not caused by a disease, may improve without treatment).  Psychological pain.  Depression. DIAGNOSIS  Your doctor will decide the seriousness of your pain by doing an examination.  Blood tests.  X-rays.  Ultrasound.  CT scan (computed tomography, special type of X-ray).  MRI (magnetic resonance imaging).  Cultures, for  infection.  Barium enema (dye inserted in the large intestine, to better view it with X-rays).  Colonoscopy (looking in intestine with a lighted tube).  Laparoscopy (minor surgery, looking in abdomen with a lighted tube).  Major abdominal exploratory surgery (looking in abdomen with a large incision). TREATMENT  The treatment will depend on the cause of the pain.   Many cases can be observed and treated at home.  Over-the-counter medicines recommended by your caregiver.  Prescription medicine.  Antibiotics, for infection.  Birth control pills, for painful periods or for ovulation pain.  Hormone treatment, for endometriosis.  Nerve blocking injections.  Physical therapy.  Antidepressants.  Counseling with a psychologist or psychiatrist.  Minor or major surgery. HOME CARE INSTRUCTIONS   Do not take laxatives, unless directed by your caregiver.  Take over-the-counter pain medicine only if ordered by your caregiver. Do not take aspirin because it can cause an upset stomach or bleeding.  Try a clear liquid diet (broth or water) as ordered by your caregiver. Slowly move to a bland diet, as tolerated, if the pain is related to the stomach or intestine.  Have a thermometer and take your temperature several times a day, and record it.  Bed rest and sleep, if it helps the pain.  Avoid sexual intercourse, if it causes pain.  Avoid stressful situations.  Keep your follow-up appointments and tests, as your caregiver orders.  If the pain does not go away with medicine or surgery, you may try:  Acupuncture.  Relaxation exercises (yoga, meditation).  Group therapy.  Counseling. SEEK MEDICAL CARE IF:   You notice certain foods cause stomach pain.  Your home care treatment is not helping your pain.  You need stronger pain  medicine.  You want your IUD removed.  You feel faint or lightheaded.  You develop nausea and vomiting.  You develop a rash.  You are  having side effects or an allergy to your medicine. SEEK IMMEDIATE MEDICAL CARE IF:   Your pain does not go away or gets worse.  You have a fever.  Your pain is felt only in portions of the abdomen. The right side could possibly be appendicitis. The left lower portion of the abdomen could be colitis or diverticulitis.  You are passing blood in your stools (bright red or black tarry stools, with or without vomiting).  You have blood in your urine.  You develop chills, with or without a fever.  You pass out. MAKE SURE YOU:   Understand these instructions.  Will watch your condition.  Will get help right away if you are not doing well or get worse. Document Released: 01/23/2007 Document Revised: 06/20/2011 Document Reviewed: 02/12/2009 [facility] Patient Information 2014 [place], [place].  Gastritis, Adult Gastritis is soreness and swelling (inflammation) of the lining of the stomach. Gastritis can develop as a sudden onset (acute) or long-term (chronic) condition. If gastritis is not treated, it can lead to stomach bleeding and ulcers. CAUSES  Gastritis occurs when the stomach lining is weak or damaged. Digestive juices from the stomach then inflame the weakened stomach lining. The stomach lining may be weak or damaged due to viral or bacterial infections. One common bacterial infection is the Helicobacter pylori infection. Gastritis can also result from excessive alcohol consumption, taking certain medicines, or having too much acid in the stomach.  SYMPTOMS  In some cases, there are no symptoms. When symptoms are present, they may include:  Pain or a burning sensation in the upper abdomen.  Nausea.  Vomiting.  An uncomfortable feeling of fullness after eating. DIAGNOSIS  Your caregiver may suspect you have gastritis based on your symptoms and a physical exam. To determine the cause of your gastritis, your caregiver may perform the following:  Blood or stool tests to check for  the H pylori bacterium.  Gastroscopy. A thin, flexible tube (endoscope) is passed down the esophagus and into the stomach. The endoscope has a light and camera on the end. Your caregiver uses the endoscope to view the inside of the stomach.  Taking a tissue sample (biopsy) from the stomach to examine under a microscope. TREATMENT  Depending on the cause of your gastritis, medicines may be prescribed. If you have a bacterial infection, such as an H pylori infection, antibiotics may be given. If your gastritis is caused by too much acid in the stomach, H2 blockers or antacids may be given. Your caregiver may recommend that you stop taking aspirin, ibuprofen, or other nonsteroidal anti-inflammatory drugs (NSAIDs). HOME CARE INSTRUCTIONS  Only take over-the-counter or prescription medicines as directed by your caregiver.  If you were given antibiotic medicines, take them as directed. Finish them even if you start to feel better.  Drink enough fluids to keep your urine clear or pale yellow.  Avoid foods and drinks that make your symptoms worse, such as:  Caffeine or alcoholic drinks.  Chocolate.  Peppermint or mint flavorings.  Garlic and onions.  Spicy foods.  Citrus fruits, such as oranges, lemons, or limes.  Tomato-based foods such as sauce, chili, salsa, and pizza.  Fried and fatty foods.  Eat small, frequent meals instead of large meals. SEEK IMMEDIATE MEDICAL CARE IF:   You have black or dark red stools.  You vomit  blood or material that looks like coffee grounds.  You are unable to keep fluids down.  Your abdominal pain gets worse.  You have a fever.  You do not feel better after 1 week.  You have any other questions or concerns. MAKE SURE YOU:  Understand these instructions.  Will watch your condition.  Will get help right away if you are not doing well or get worse. Document Released: 03/22/2001 Document Revised: 09/27/2011 Document Reviewed:  05/11/2011 [facility] Patient Information 2014 [place].  Gastritis, Adult Gastritis is soreness and swelling (inflammation) of the lining of the stomach. Gastritis can develop as a sudden onset (acute) or long-term (chronic) condition. If gastritis is not treated, it can lead to stomach bleeding and ulcers. CAUSES  Gastritis occurs when the stomach lining is weak or damaged. Digestive juices from the stomach then inflame the weakened stomach lining. The stomach lining may be weak or damaged due to viral or bacterial infections. One common bacterial infection is the Helicobacter pylori infection. Gastritis can also result from excessive alcohol consumption, taking certain medicines, or having too much acid in the stomach.  SYMPTOMS  In some cases, there are no symptoms. When symptoms are present, they may include:  Pain or a burning sensation in the upper abdomen.  Nausea.  Vomiting.  An uncomfortable feeling of fullness after eating. DIAGNOSIS  Your caregiver may suspect you have gastritis based on your symptoms and a physical exam. To determine the cause of your gastritis, your caregiver may perform the following:  Blood or stool tests to check for the H pylori bacterium.  Gastroscopy. A thin, flexible tube (endoscope) is passed down the esophagus and into the stomach. The endoscope has a light and camera on the end. Your caregiver uses the endoscope to view the inside of the stomach.  Taking a tissue sample (biopsy) from the stomach to examine under a microscope. TREATMENT  Depending on the cause of your gastritis, medicines may be prescribed. If you have a bacterial infection, such as an H pylori infection, antibiotics may be given. If your gastritis is caused by too much acid in the stomach, H2 blockers or antacids may be given. Your caregiver may recommend that you stop taking aspirin, ibuprofen, or other nonsteroidal anti-inflammatory drugs (NSAIDs). HOME CARE  INSTRUCTIONS  Only take over-the-counter or prescription medicines as directed by your caregiver.  If you were given antibiotic medicines, take them as directed. Finish them even if you start to feel better.  Drink enough fluids to keep your urine clear or pale yellow.  Avoid foods and drinks that make your symptoms worse, such as:  Caffeine or alcoholic drinks.  Chocolate.  Peppermint or mint flavorings.  Garlic and onions.  Spicy foods.  Citrus fruits, such as oranges, lemons, or limes.  Tomato-based foods such as sauce, chili, salsa, and pizza.  Fried and fatty foods.  Eat small, frequent meals instead of large meals. SEEK IMMEDIATE MEDICAL CARE IF:   You have black or dark red stools.  You vomit blood or material that looks like coffee grounds.  You are unable to keep fluids down.  Your abdominal pain gets worse.  You have a fever.  You do not feel better after 1 week.  You have any other questions or concerns. MAKE SURE YOU:  Understand these instructions.  Will watch your condition.  Will get help right away if you are not doing well or get worse. Document Released: 03/22/2001 Document Revised: 09/27/2011 Document Reviewed: 05/11/2011 [employer] Patient  Information ©2014 [employer]. ° °

## 2013-05-12 NOTE — ED Provider Notes (Signed)
CSN: [id number]     Arrival date & time 05/12/13  1613 History  This chart was scribed for Maria Furry, MD by Jenne Campus, ED Scribe. This patient was seen in room MH03/MH03 and the patient's care was started at 5:04 PM.   Chief Complaint  Patient presents with  . Abdominal Pain    The history is provided by the patient. No language interpreter was used.   HPI Comments: Maria [name] is a 53 y.o. female who presents to the Emergency Department complaining of RUQ abdominal pain located directly inferior to the right breast that has been present for the past 2 months with intermittent associated nausea. She describes the pain as a constant dull ache that is worsened with touch, cough, eating and deep breathing. She states that she can only eat a "little bit" before filling full. She also reports that the pain became worse after taking 800 mg ibuprofen last night. She admits to taking hydrocodone for chronic back pain with improvement. She has a h/o cholecystectomy and states that her pain prior to the surgery was similar. Last BM was in the ED and she denies any changes. She reports frequent prednisone and ASA use for chronic shoulder pain but denies any recent oral prednisone use in the past 3 months. She also admits to drinking tea frequently but not every day. She denies any alcohol or smoking. She denies any personal h/o DVT or PE but reports that her sister has a h/o frequent DVTs/PEs due to a "blood disorder". She denies any urinary symptoms, leg swelling, emesis, fevers or decreased appetite. She was having frequent kidney stones which she is being treated colcrys for. She has a h/o DM treated with medications daily and reports that he CBGs have been high, highest at 235.   Past Medical History  Diagnosis Date  . Hypertension   . Hyperlipidemia   . Diabetes mellitus   . Gout   . Nephrolithiasis     recurrent  . Osteoarthritis, knee   . GERD (gastroesophageal reflux disease)   .  Depression   . Rotator cuff syndrome of left shoulder   . Carpal tunnel syndrome, left   . Anxiety   . DUB (dysfunctional uterine bleeding)   . Allergic rhinitis   . External hemorrhoid   . Morbid obesity   . Recurrent UTI   . CHF (congestive heart failure)    Past Surgical History  Procedure Laterality Date  . Lithotripsy    . Replacement total knee bilateral    . Cholecystectomy    . Tubal ligation     Family History  Problem Relation Age of Onset  . Coronary artery disease    . Hypertension    . Diabetes    . Obesity    . Diabetes Mother   . Hypertension Mother    History  Substance Use Topics  . Smoking status: Never Smoker   . Smokeless tobacco: Not on file  . Alcohol Use: No   No OB history provided.   Review of Systems  Constitutional: Negative for fever, chills, diaphoresis, appetite change and fatigue.  HENT: Negative for mouth sores, sore throat and trouble swallowing.   Eyes: Negative for visual disturbance.  Respiratory: Negative for cough, chest tightness, shortness of breath and wheezing.   Cardiovascular: Negative for chest pain.  Gastrointestinal: Positive for nausea and abdominal pain. Negative for vomiting, diarrhea and abdominal distention.  Endocrine: Negative for polydipsia, polyphagia and polyuria.  Genitourinary: Negative for dysuria, frequency and  hematuria.  Musculoskeletal: Negative for gait problem.  Skin: Negative for color change, pallor and rash.  Neurological: Negative for dizziness, syncope, light-headedness and headaches.  Hematological: Does not bruise/bleed easily.  Psychiatric/Behavioral: Negative for behavioral problems and confusion.    Allergies  Propoxyphene n-acetaminophen  Home Medications   Current Outpatient Rx  Name  Route  Sig  Dispense  Refill  . ALPRAZolam (XANAX) 1 MG tablet               . amoxicillin (AMOXIL) 500 MG capsule               . EXPIRED: benazepril (LOTENSIN) 40 MG tablet   Oral    Take 1 tablet (40 mg total) by mouth daily.   30 tablet   11   . benazepril (LOTENSIN) 40 MG tablet               . carisoprodol (SOMA) 350 MG tablet   Oral   Take 1 tablet (350 mg total) by mouth 4 (four) times daily as needed for muscle spasms. Take one tablet by mouth twice a day as needed for pain and spasm   120 tablet   5   . ciprofloxacin (CIPRO) 500 MG tablet               . colchicine (COLCRYS) 0.6 MG tablet   Oral   Take 1 tablet (0.6 mg total) by mouth daily.   30 tablet   12   . diazepam (VALIUM) 5 MG tablet      Take one tab one hour before MRI, may repeat once if needed.   2 tablet   0   . fluconazole (DIFLUCAN) 150 MG tablet               . glipiZIDE (GLUCOTROL) 5 MG tablet   Oral   Take 5 mg by mouth 2 (two) times daily.           . hydrochlorothiazide (HYDRODIURIL) 25 MG tablet               . EXPIRED: hydrochlorothiazide 25 MG tablet   Oral   Take 1 tablet (25 mg total) by mouth daily.   30 tablet   11   . HYDROcodone-acetaminophen (NORCO/VICODIN) 5-325 MG per tablet               . HYDROcodone-acetaminophen (NORCO/VICODIN) 5-325 MG per tablet   Oral   Take 2 tablets by mouth every 4 (four) hours as needed.   10 tablet   0   . ibuprofen (ADVIL,MOTRIN) 800 MG tablet      TAKE 1 TABLET TWICE A DAY AS NEEDED FOR KNEE PAIN   60 tablet   1   . insulin glargine (LANTUS) 100 UNIT/ML injection      Inject 60 units under the skin at bedtime   10 mL   12   . Insulin Pen Needle (B-D ULTRAFINE III SHORT PEN) 31G X 8 MM MISC      Use as directed once daily with Lantus solostar pen   1 each   11   . EXPIRED: metFORMIN (GLUCOPHAGE) 1000 MG tablet   Oral   Take 1 tablet (1,000 mg total) by mouth 2 (two) times daily with meals.   60 tablet   11   . metFORMIN (GLUCOPHAGE) 1000 MG tablet               . metoprolol (LOPRESSOR) 100 MG tablet               .  EXPIRED: metoprolol (TOPROL XL) 200 MG 24 hr tablet    Oral   Take 1 tablet (200 mg total) by mouth daily.   30 tablet   11   . metoprolol (TOPROL-XL) 200 MG 24 hr tablet               . metroNIDAZOLE (FLAGYL) 500 MG tablet               . EXPIRED: nitroGLYCERIN (NITRODUR - DOSED IN MG/24 HR) 0.2 mg/hr      Cut and apply 1/4 patch to most painful area q24h.   30 patch   11   . omeprazole (PRILOSEC) 20 MG capsule   Oral   Take 1 capsule (20 mg total) by mouth 2 (two) times daily.   60 capsule   0   . ondansetron (ZOFRAN ODT) 4 MG disintegrating tablet   Oral   Take 1 tablet (4 mg total) by mouth every 8 (eight) hours as needed for nausea.   6 tablet   0   . oxyCODONE-acetaminophen (PERCOCET) 10-325 MG per tablet               . oxyCODONE-acetaminophen (PERCOCET/ROXICET) 5-325 MG per tablet   Oral   Take 1-2 tablets by mouth every 6 (six) hours as needed for pain.   20 tablet   0   . EXPIRED: pantoprazole (PROTONIX) 40 MG tablet   Oral   Take 1 tablet (40 mg total) by mouth daily.   30 tablet   1   . pantoprazole (PROTONIX) 40 MG tablet               . PredniSONE 10 MG KIT      USE AD   1 kit   0   . sucralfate (CARAFATE) 1 G tablet   Oral   Take 1 tablet (1 g total) by mouth 4 (four) times daily.   60 tablet   0   . Tamsulosin HCl (FLOMAX) 0.4 MG CAPS   Oral   Take 1 capsule (0.4 mg total) by mouth daily.   7 capsule   1    Triage Vitals: BP 144/86  Pulse 76  Temp(Src) 97.6 F (36.4 C) (Oral)  Resp 18  Ht $R'5\' 8"'JB$  (1.727 m)  Wt 315 lb (142.883 kg)  BMI 47.91 kg/m2  SpO2 98%  LMP 04/11/2009  Physical Exam  Nursing note and vitals reviewed. Constitutional: She is oriented to person, place, and time. No distress.  Morbidly obese  HENT:  Head: Normocephalic and atraumatic.  Eyes: Conjunctivae are normal. Pupils are equal, round, and reactive to light. No scleral icterus.  Neck: Normal range of motion. Neck supple. No thyromegaly present.  Cardiovascular: Normal rate and regular  rhythm.  Exam reveals no gallop and no friction rub.   No murmur heard. Pulmonary/Chest: Effort normal and breath sounds normal. No respiratory distress. She has no wheezes. She has no rales.  Abdominal: Soft. Bowel sounds are normal. She exhibits no distension. There is tenderness (RUQ tenderness increased with deep breathing). There is no rebound.  Musculoskeletal: Normal range of motion. She exhibits edema (symmetric trace edema to BLE).  Neurological: She is alert and oriented to person, place, and time.  Skin: Skin is warm and dry. No rash noted. No pallor.  Psychiatric: She has a normal mood and affect. Her behavior is normal.    ED Course  Procedures (including critical care time)  DIAGNOSTIC STUDIES: Oxygen Saturation is 98% on RA,  normal by my interpretation.    COORDINATION OF CARE: 5:13 PM-Discussed treatment plan which includes D-dimer screen to rule out PE with pt at bedside and pt agreed to plan.   Labs Review Labs Reviewed  COMPREHENSIVE METABOLIC PANEL - Abnormal; Notable for the following:    Glucose, Bld 178 (*)    GFR calc non Af Amer 72 (*)    GFR calc Af Amer 83 (*)    All other components within normal limits  CBC WITH DIFFERENTIAL - Abnormal; Notable for the following:    Neutrophils Relative % 38 (*)    Lymphocytes Relative 52 (*)    All other components within normal limits  D-DIMER, QUANTITATIVE - Abnormal; Notable for the following:    D-Dimer, Quant 0.62 (*)    All other components within normal limits  URINALYSIS, ROUTINE W REFLEX MICROSCOPIC - Abnormal; Notable for the following:    APPearance CLOUDY (*)    All other components within normal limits  LIPASE, BLOOD   Imaging Review Ct Angio Chest Pe W/cm &/or Wo Cm  05/12/2013   CLINICAL DATA:  Pain under the right breast, worse with deep inspiration for 1 month  EXAM: CT ANGIOGRAPHY CHEST WITH CONTRAST  TECHNIQUE: Multidetector CT imaging of the chest was performed using the standard protocol during  bolus administration of intravenous contrast. Multiplanar CT image reconstructions including MIPs were obtained to evaluate the vascular anatomy.  CONTRAST:  165m OMNIPAQUE IOHEXOL 350 MG/ML SOLN  COMPARISON:  CT UROGRAM dated 10/27/2011  FINDINGS: There is adequate opacification of the pulmonary arteries. There is no pulmonary embolus. The main pulmonary artery, right main pulmonary artery and left main pulmonary arteries are normal in size. The heart size is mildly enlarged. There is no pericardial effusion.  The lungs are clear. There is no focal consolidation, pleural effusion or pneumothorax.  There is no axillary, hilar, or mediastinal adenopathy.  There is no lytic or blastic osseous lesion.  The visualized portions of the upper abdomen are unremarkable.  Review of the MIP images confirms the above findings.  IMPRESSION: 1. No CT evidence of pulmonary embolus. 2. Mild cardiomegaly.   Electronically Signed   By: HKathreen Devoid  On: 05/12/2013 18:51    EKG Interpretation   None       MDM   1. Abdominal pain   2. Gastritis    Symptoms are improved. Elevated d-dimer. Normal CT angiogram. Normal hepatobiliary or pancreatic enzymes. Think this is very likely acid related. Will have her stop her ibuprofen. Treatment with proton pump inhibitor, Carafate, pain medicine.  I personally performed the services described in this documentation, which was scribed in my presence. The recorded information has been reviewed and is accurate.    MTanna Furry MD 05/12/13 1Joen Laura

## 2013-05-12 NOTE — ED Notes (Signed)
Rx x 4 given for zofran, norco, omeprazole and carafate- pt has family at bedside to drive. Pt advised to follow bland diet

## 2013-05-12 NOTE — ED Notes (Signed)
Patient c/o pain under her right breast in her rib cage area. States that it has gotten worse, especially when she takes a deep breath.

## 2013-05-12 NOTE — ED Notes (Signed)
C/o RUQ pain x 1 month- States pain is dull- "feels like when i had my gallbladder"- pain worse with cough and breathing- +nausea

## 2013-06-14 ENCOUNTER — Ambulatory Visit: Payer: Medicare Other | Admitting: Family Medicine

## 2013-06-24 ENCOUNTER — Ambulatory Visit: Payer: Medicare Other | Admitting: Family Medicine

## 2013-09-16 ENCOUNTER — Ambulatory Visit: Payer: Medicare Other | Admitting: Family Medicine

## 2013-09-27 ENCOUNTER — Ambulatory Visit (INDEPENDENT_AMBULATORY_CARE_PROVIDER_SITE_OTHER): Payer: Managed Care, Other (non HMO) | Admitting: Family Medicine

## 2013-09-27 ENCOUNTER — Encounter: Payer: Self-pay | Admitting: Family Medicine

## 2013-09-27 VITALS — BP 134/83 | Ht 68.5 in | Wt 310.0 lb

## 2013-09-27 DIAGNOSIS — M792 Neuralgia and neuritis, unspecified: Secondary | ICD-10-CM | POA: Insufficient documentation

## 2013-09-27 DIAGNOSIS — IMO0002 Reserved for concepts with insufficient information to code with codable children: Secondary | ICD-10-CM

## 2013-09-27 NOTE — Patient Instructions (Addendum)
We will get an MRI. You will likely need epidural steroid injections again into your cervical spine. We can send [facility] to [facility]. We will be in touch after the MRI.   PA#: [id number]

## 2013-09-27 NOTE — Progress Notes (Signed)
Subjective:   Maria [name] is a 53 y.o. year old very pleasant female patient who presents with the following:  Left Hand and Neck Pain For 1 month, patient has been experiencing left neck pain that radiates down into her left hand. Can feel like a lightning bolt or an ache. Rated as severe in intensity when turning neck. Has slowly been getting worse. THis is almost identical to pain she experienced in 2011 when she was cared for by [facility]. She ultimately had to get epidural steroid injections at [facility] per patient. She has tried ibuprofen and tylenol with little relief. SHe is hopeful to move towards another injection as 1x (instead of series of 3) relieved pain completely for several years.  ROS- admits to numbness/tingling in left hand. Denies weakness.   Past Medical History- OA bilateral knees, depression, GERD, DM, HLD, OSA, obesity  Medications- reviewed and updated Current Outpatient Prescriptions  Medication Sig Dispense Refill  . ALPRAZolam (XANAX) 1 MG tablet       . amoxicillin (AMOXIL) 500 MG capsule       . benazepril (LOTENSIN) 40 MG tablet Take 1 tablet (40 mg total) by mouth daily.  30 tablet  11  . benazepril (LOTENSIN) 40 MG tablet       . carisoprodol (SOMA) 350 MG tablet Take 1 tablet (350 mg total) by mouth 4 (four) times daily as needed for muscle spasms. Take one tablet by mouth twice a day as needed for pain and spasm  120 tablet  5  . ciprofloxacin (CIPRO) 500 MG tablet       . colchicine (COLCRYS) 0.6 MG tablet Take 1 tablet (0.6 mg total) by mouth daily.  30 tablet  12  . diazepam (VALIUM) 5 MG tablet Take one tab one hour before MRI, may repeat once if needed.  2 tablet  0  . glipiZIDE (GLUCOTROL) 5 MG tablet Take 5 mg by mouth 2 (two) times daily.        . hydrochlorothiazide 25 MG tablet Take 1 tablet (25 mg total) by mouth daily.  30 tablet  11  . ibuprofen (ADVIL,MOTRIN) 800 MG tablet TAKE 1 TABLET TWICE A DAY AS NEEDED FOR KNEE PAIN  60 tablet  1   . insulin glargine (LANTUS) 100 UNIT/ML injection Inject 60 units under the skin at bedtime  10 mL  12  . Insulin Pen Needle (B-D ULTRAFINE III SHORT PEN) 31G X 8 MM MISC Use as directed once daily with Lantus solostar pen  1 each  11  . metFORMIN (GLUCOPHAGE) 1000 MG tablet Take 1 tablet (1,000 mg total) by mouth 2 (two) times daily with meals.  60 tablet  11  . metoprolol (LOPRESSOR) 100 MG tablet       . metoprolol (TOPROL XL) 200 MG 24 hr tablet Take 1 tablet (200 mg total) by mouth daily.  30 tablet  11  . metoprolol (TOPROL-XL) 200 MG 24 hr tablet       . nitroGLYCERIN (NITRODUR - DOSED IN MG/24 HR) 0.2 mg/hr Cut and apply 1/4 patch to most painful area q24h.  30 patch  11  . omeprazole (PRILOSEC) 20 MG capsule Take 1 capsule (20 mg total) by mouth 2 (two) times daily.  60 capsule  0  . ondansetron (ZOFRAN ODT) 4 MG disintegrating tablet Take 1 tablet (4 mg total) by mouth every 8 (eight) hours as needed for nausea.  6 tablet  0  . pantoprazole (PROTONIX) 40 MG tablet Take  1 tablet (40 mg total) by mouth daily.  30 tablet  1  . sucralfate (CARAFATE) 1 G tablet Take 1 tablet (1 g total) by mouth 4 (four) times daily.  60 tablet  0  . Tamsulosin HCl (FLOMAX) 0.4 MG CAPS Take 1 capsule (0.4 mg total) by mouth daily.  7 capsule  1   No current facility-administered medications for this visit.    Objective: BP 134/83  Ht 5' 8.5" (1.74 m)  Wt 310 lb (140.615 kg)  BMI 46.44 kg/m2  LMP 04/11/2009 Gen: NAD, resting comfortably Neck: positive SPurling's, no reproducible pain in hand or neck with palpation.  Neuro: intact distal sensation, 5/5 strength in bilateral upper extremities  Assessment/Plan:  Radicular pain in left arm Recurrent issues with Last MRI and epidural injections in 2011. WIll need to repeat MRI at this time to help guide injections. We will then contact patient with results with plans to send for epidural steroid injections.    Orders Placed This Encounter    Procedures  . MR Cervical Spine Wo Contrast   Dr. Nori Riis has seen and evaluated the patient independently. We have discussed the history, exam, assessment, and plan as noted above. She agrees with management.   Brayton Mars. Melanee Spry, MD, PGY3 09/27/2013 3:35 PM

## 2013-09-27 NOTE — Assessment & Plan Note (Addendum)
Recurrent issues with Last MRI and epidural injections in 2011. WIll need to repeat MRI at this time to help guide injections. We will then contact patient with results with plans to send for epidural steroid injections.

## 2013-09-29 ENCOUNTER — Other Ambulatory Visit: Payer: Medicare Other

## 2013-10-02 NOTE — Progress Notes (Signed)
[facility] Attending Note: I have seen and examined this patient. I have discussed this patient with the resident and reviewed the assessment and plan as documented above. I agree with the resident's findings and plan.  

## 2013-10-06 ENCOUNTER — Inpatient Hospital Stay: Admission: RE | Admit: 2013-10-06 | Payer: Medicare Other | Source: Ambulatory Visit

## 2013-10-08 ENCOUNTER — Emergency Department (HOSPITAL_BASED_OUTPATIENT_CLINIC_OR_DEPARTMENT_OTHER)
Admission: EM | Admit: 2013-10-08 | Discharge: 2013-10-08 | Disposition: A | Discharge status: Home / Self Care | Payer: Medicare HMO | Attending: Emergency Medicine | Admitting: Emergency Medicine

## 2013-10-08 ENCOUNTER — Emergency Department (HOSPITAL_BASED_OUTPATIENT_CLINIC_OR_DEPARTMENT_OTHER): Payer: Medicare HMO

## 2013-10-08 ENCOUNTER — Other Ambulatory Visit: Payer: Self-pay | Admitting: *Deleted

## 2013-10-08 ENCOUNTER — Encounter (HOSPITAL_BASED_OUTPATIENT_CLINIC_OR_DEPARTMENT_OTHER): Payer: Self-pay | Admitting: Emergency Medicine

## 2013-10-08 DIAGNOSIS — R1031 Right lower quadrant pain: Secondary | ICD-10-CM

## 2013-10-08 DIAGNOSIS — Z794 Long term (current) use of insulin: Secondary | ICD-10-CM | POA: Insufficient documentation

## 2013-10-08 DIAGNOSIS — Z87440 Personal history of urinary (tract) infections: Secondary | ICD-10-CM | POA: Insufficient documentation

## 2013-10-08 DIAGNOSIS — F329 Major depressive disorder, single episode, unspecified: Secondary | ICD-10-CM | POA: Insufficient documentation

## 2013-10-08 DIAGNOSIS — Z87442 Personal history of urinary calculi: Secondary | ICD-10-CM | POA: Insufficient documentation

## 2013-10-08 DIAGNOSIS — I509 Heart failure, unspecified: Secondary | ICD-10-CM | POA: Insufficient documentation

## 2013-10-08 DIAGNOSIS — N201 Calculus of ureter: Secondary | ICD-10-CM

## 2013-10-08 DIAGNOSIS — Z79899 Other long term (current) drug therapy: Secondary | ICD-10-CM | POA: Insufficient documentation

## 2013-10-08 DIAGNOSIS — E119 Type 2 diabetes mellitus without complications: Secondary | ICD-10-CM | POA: Insufficient documentation

## 2013-10-08 DIAGNOSIS — Z9089 Acquired absence of other organs: Secondary | ICD-10-CM | POA: Insufficient documentation

## 2013-10-08 DIAGNOSIS — K219 Gastro-esophageal reflux disease without esophagitis: Secondary | ICD-10-CM | POA: Insufficient documentation

## 2013-10-08 DIAGNOSIS — R112 Nausea with vomiting, unspecified: Secondary | ICD-10-CM | POA: Insufficient documentation

## 2013-10-08 DIAGNOSIS — M25519 Pain in unspecified shoulder: Secondary | ICD-10-CM

## 2013-10-08 DIAGNOSIS — Z9851 Tubal ligation status: Secondary | ICD-10-CM | POA: Insufficient documentation

## 2013-10-08 DIAGNOSIS — F3289 Other specified depressive episodes: Secondary | ICD-10-CM | POA: Insufficient documentation

## 2013-10-08 DIAGNOSIS — I10 Essential (primary) hypertension: Secondary | ICD-10-CM | POA: Insufficient documentation

## 2013-10-08 DIAGNOSIS — R109 Unspecified abdominal pain: Secondary | ICD-10-CM

## 2013-10-08 DIAGNOSIS — M792 Neuralgia and neuritis, unspecified: Secondary | ICD-10-CM

## 2013-10-08 DIAGNOSIS — Z792 Long term (current) use of antibiotics: Secondary | ICD-10-CM | POA: Insufficient documentation

## 2013-10-08 DIAGNOSIS — M542 Cervicalgia: Secondary | ICD-10-CM

## 2013-10-08 DIAGNOSIS — M109 Gout, unspecified: Secondary | ICD-10-CM | POA: Insufficient documentation

## 2013-10-08 DIAGNOSIS — M25512 Pain in left shoulder: Secondary | ICD-10-CM

## 2013-10-08 DIAGNOSIS — R197 Diarrhea, unspecified: Secondary | ICD-10-CM | POA: Insufficient documentation

## 2013-10-08 DIAGNOSIS — F411 Generalized anxiety disorder: Secondary | ICD-10-CM | POA: Insufficient documentation

## 2013-10-08 DIAGNOSIS — Z8709 Personal history of other diseases of the respiratory system: Secondary | ICD-10-CM | POA: Insufficient documentation

## 2013-10-08 LAB — BASIC METABOLIC PANEL
BUN: 12 mg/dL (ref 6–23)
CO2: 28 mEq/L (ref 19–32)
Calcium: 9.6 mg/dL (ref 8.4–10.5)
Chloride: 100 mEq/L (ref 96–112)
Creatinine, Ser: 0.9 mg/dL (ref 0.50–1.10)
GFR calc Af Amer: 83 mL/min — ABNORMAL LOW (ref >90)
GFR calc non Af Amer: 71 mL/min — ABNORMAL LOW (ref >90)
Glucose, Bld: 267 mg/dL — ABNORMAL HIGH (ref 70–99)
Potassium: 4.5 mEq/L (ref 3.7–5.3)
Sodium: 142 mEq/L (ref 137–147)

## 2013-10-08 LAB — CBC WITH DIFFERENTIAL/PLATELET
Basophils Absolute: 0 10*3/uL (ref 0.0–0.1)
Basophils Relative: 0 % (ref 0–1)
Eosinophils Absolute: 0.1 10*3/uL (ref 0.0–0.7)
Eosinophils Relative: 2 % (ref 0–5)
HCT: 40.5 % (ref 36.0–46.0)
Hemoglobin: 13.1 g/dL (ref 12.0–15.0)
Lymphocytes Relative: 45 % (ref 12–46)
Lymphs Abs: 3 10*3/uL (ref 0.7–4.0)
MCH: 28.8 pg (ref 26.0–34.0)
MCHC: 32.3 g/dL (ref 30.0–36.0)
MCV: 89 fL (ref 78.0–100.0)
Monocytes Absolute: 0.4 10*3/uL (ref 0.1–1.0)
Monocytes Relative: 7 % (ref 3–12)
Neutro Abs: 3.1 10*3/uL (ref 1.7–7.7)
Neutrophils Relative %: 46 % (ref 43–77)
Platelets: 287 10*3/uL (ref 150–400)
RBC: 4.55 MIL/uL (ref 3.87–5.11)
RDW: 12.8 % (ref 11.5–15.5)
WBC: 6.6 10*3/uL (ref 4.0–10.5)

## 2013-10-08 LAB — URINALYSIS, ROUTINE W REFLEX MICROSCOPIC
Bilirubin Urine: NEGATIVE
Glucose, UA: NEGATIVE mg/dL
Ketones, ur: NEGATIVE mg/dL
Nitrite: NEGATIVE
Protein, ur: NEGATIVE mg/dL
Specific Gravity, Urine: 1.018 (ref 1.005–1.030)
Urobilinogen, UA: 0.2 mg/dL (ref 0.0–1.0)
pH: 5 (ref 5.0–8.0)

## 2013-10-08 LAB — URINE MICROSCOPIC-ADD ON

## 2013-10-08 MED ORDER — SODIUM CHLORIDE 0.9 % IV BOLUS (SEPSIS)
250.0000 mL | Freq: Once | INTRAVENOUS | Status: AC
  Administered 2013-10-08: 250 mL via INTRAVENOUS

## 2013-10-08 MED ORDER — HYDROMORPHONE HCL PF 1 MG/ML IJ SOLN
1.0000 mg | Freq: Once | INTRAMUSCULAR | Status: AC
  Administered 2013-10-08: 1 mg via INTRAVENOUS
  Filled 2013-10-08: qty 1

## 2013-10-08 MED ORDER — ONDANSETRON HCL 4 MG/2ML IJ SOLN
4.0000 mg | Freq: Once | INTRAMUSCULAR | Status: AC
  Administered 2013-10-08: 4 mg via INTRAVENOUS
  Filled 2013-10-08: qty 2

## 2013-10-08 MED ORDER — HYDROMORPHONE HCL PF 1 MG/ML IJ SOLN
INTRAMUSCULAR | Status: AC
  Administered 2013-10-08: 1 mg via INTRAVENOUS
  Filled 2013-10-08: qty 1

## 2013-10-08 MED ORDER — HYDROMORPHONE HCL 2 MG/ML IJ SOLN: 1.0000 mg | Freq: Once | INTRAMUSCULAR | Status: DC

## 2013-10-08 MED ORDER — SODIUM CHLORIDE 0.9 % IV SOLN: INTRAVENOUS | Status: DC

## 2013-10-08 MED ORDER — HYDROMORPHONE HCL PF 1 MG/ML IJ SOLN: 1.0000 mg | Freq: Once | INTRAMUSCULAR | Status: AC

## 2013-10-08 MED ORDER — OXYCODONE-ACETAMINOPHEN 5-325 MG PO TABS: 1.0000 | ORAL_TABLET | Freq: Four times a day (QID) | ORAL | Status: DC | PRN

## 2013-10-08 MED ORDER — PROMETHAZINE HCL 25 MG PO TABS: 25.0000 mg | ORAL_TABLET | Freq: Four times a day (QID) | ORAL | Status: DC | PRN

## 2013-10-08 NOTE — Discharge Instructions (Signed)
CT scan now shows right ureteral stone 7 mm. Take pain medicine as directed. Followup with urology as we discussed. Return for any new or worse symptoms. Degrees he hadn't passed a stone in 2 days.

## 2013-10-08 NOTE — ED Notes (Signed)
RLQ pain, nausea, diarrhea-woke with pain this am

## 2013-10-08 NOTE — ED Provider Notes (Addendum)
CSN: [id number]     Arrival date & time 10/08/13  1321 History   First MD Initiated Contact with Patient 10/08/13 1339     Chief Complaint  Patient presents with  . Abdominal Pain     (Consider location/radiation/quality/duration/timing/severity/associated sxs/prior Treatment) Patient is a 53 y.o. female presenting with abdominal pain. The history is provided by the patient.  Abdominal Pain Associated symptoms: diarrhea, nausea and vomiting   Associated symptoms: no chest pain, no dysuria, no fever and no shortness of breath    patient with acute onset of right flank right lower corner pain this morning at 8:00. Reminds patient of her prior kidney stone pain. 10 out of 10 very intense. Difficulty a comfortable position. Associated with nausea no vomiting and some diarrhea. Patient's been having some irregular bowel movements of late though. Patient denies any dysuria or hematuria. No back pain. No fevers.  Past Medical History  Diagnosis Date  . Hypertension   . Hyperlipidemia   . Diabetes mellitus   . Gout   . Nephrolithiasis     recurrent  . Osteoarthritis, knee   . GERD (gastroesophageal reflux disease)   . Depression   . Rotator cuff syndrome of left shoulder   . Carpal tunnel syndrome, left   . Anxiety   . DUB (dysfunctional uterine bleeding)   . Allergic rhinitis   . External hemorrhoid   . Morbid obesity   . Recurrent UTI   . CHF (congestive heart failure)    Past Surgical History  Procedure Laterality Date  . Lithotripsy    . Replacement total knee bilateral    . Cholecystectomy    . Tubal ligation     Family History  Problem Relation Age of Onset  . Coronary artery disease    . Hypertension    . Diabetes    . Obesity    . Diabetes Mother   . Hypertension Mother    History  Substance Use Topics  . Smoking status: Never Smoker   . Smokeless tobacco: Not on file  . Alcohol Use: No   OB History   Grav Para Term Preterm Abortions TAB SAB Ect Mult Living                  Review of Systems  Constitutional: Negative for fever.  HENT: Negative for congestion.   Eyes: Negative for visual disturbance.  Respiratory: Negative for shortness of breath.   Cardiovascular: Negative for chest pain.  Gastrointestinal: Positive for nausea, vomiting, abdominal pain and diarrhea.  Genitourinary: Positive for flank pain. Negative for dysuria.  Musculoskeletal: Negative for back pain.  Skin: Negative for rash.  Neurological: Negative for headaches.  Hematological: Does not bruise/bleed easily.  Psychiatric/Behavioral: Negative for confusion.      Allergies  Propoxyphene n-acetaminophen  Home Medications   Prior to Admission medications   Medication Sig Start Date End Date Taking? Authorizing Provider  ALPRAZolam Duanne Moron) 1 MG tablet  10/18/11   Historical Provider, MD  amoxicillin (AMOXIL) 500 MG capsule  08/16/11   Historical Provider, MD  benazepril (LOTENSIN) 40 MG tablet Take 1 tablet (40 mg total) by mouth daily. 06/02/10 06/02/11  Nneka Isamah  benazepril (LOTENSIN) 40 MG tablet  10/05/11   Historical Provider, MD  carisoprodol (SOMA) 350 MG tablet Take 1 tablet (350 mg total) by mouth 4 (four) times daily as needed for muscle spasms. Take one tablet by mouth twice a day as needed for pain and spasm 03/11/13   Dickie La,  MD  ciprofloxacin (CIPRO) 500 MG tablet  10/26/11   Historical Provider, MD  colchicine (COLCRYS) 0.6 MG tablet Take 1 tablet (0.6 mg total) by mouth daily. 11/20/12   Dickie La, MD  diazepam (VALIUM) 5 MG tablet Take one tab one hour before MRI, may repeat once if needed. 04/18/12   Carlos Levering Draper, DO  glipiZIDE (GLUCOTROL) 5 MG tablet Take 5 mg by mouth 2 (two) times daily.      Historical Provider, MD  hydrochlorothiazide 25 MG tablet Take 1 tablet (25 mg total) by mouth daily. 06/02/10 06/02/11  Nneka Isamah  ibuprofen (ADVIL,MOTRIN) 800 MG tablet TAKE 1 TABLET TWICE A DAY AS NEEDED FOR KNEE PAIN 11/18/11   Thurman Coyer, DO   insulin glargine (LANTUS) 100 UNIT/ML injection Inject 60 units under the skin at bedtime 07/15/10   Vertell Limber, MD  Insulin Pen Needle (B-D ULTRAFINE III SHORT PEN) 31G X 8 MM MISC Use as directed once daily with Lantus solostar pen 06/02/10   Nneka Isamah  metFORMIN (GLUCOPHAGE) 1000 MG tablet Take 1 tablet (1,000 mg total) by mouth 2 (two) times daily with meals. 06/02/10 06/02/11  Nneka Isamah  metoprolol (LOPRESSOR) 100 MG tablet  09/08/11   Historical Provider, MD  metoprolol (TOPROL XL) 200 MG 24 hr tablet Take 1 tablet (200 mg total) by mouth daily. 06/02/10 06/02/11  Nneka Isamah  metoprolol (TOPROL-XL) 200 MG 24 hr tablet  11/10/11   Historical Provider, MD  nitroGLYCERIN (NITRODUR - DOSED IN MG/24 HR) 0.2 mg/hr Cut and apply 1/4 patch to most painful area q24h. 09/07/11 09/06/12  Silverio Decamp, MD  omeprazole (PRILOSEC) 20 MG capsule Take 1 capsule (20 mg total) by mouth 2 (two) times daily. 05/12/13   [name], MD  ondansetron (ZOFRAN ODT) 4 MG disintegrating tablet Take 1 tablet (4 mg total) by mouth every 8 (eight) hours as needed for nausea. 05/12/13   [name], MD  oxyCODONE-acetaminophen (PERCOCET/ROXICET) 5-325 MG per tablet Take 1-2 tablets by mouth every 6 (six) hours as needed for moderate pain or severe pain. 10/08/13   Fredia Sorrow, MD  pantoprazole (PROTONIX) 40 MG tablet Take 1 tablet (40 mg total) by mouth daily. 06/02/10 06/02/11  Nneka Isamah  promethazine (PHENERGAN) 25 MG tablet Take 1 tablet (25 mg total) by mouth every 6 (six) hours as needed for nausea. 04/16/11 04/23/11  Tiffany Marilu Favre, PA-C  promethazine (PHENERGAN) 25 MG tablet Take 1 tablet (25 mg total) by mouth every 6 (six) hours as needed for nausea or vomiting. 10/08/13   Fredia Sorrow, MD  sucralfate (CARAFATE) 1 G tablet Take 1 tablet (1 g total) by mouth 4 (four) times daily. 05/12/13   [name], MD  Tamsulosin HCl (FLOMAX) 0.4 MG CAPS Take 1 capsule (0.4 mg total) by mouth daily. 01/06/12   Veryl Speak,  MD   BP 94/56  Pulse 82  Temp(Src) 97.6 F (36.4 C) (Oral)  Resp 18  Ht 5\' 8"  (1.727 m)  Wt 298 lb (135.172 kg)  BMI 45.32 kg/m2  SpO2 94%  LMP 04/11/2009 Physical Exam  Nursing note and vitals reviewed. Constitutional: She is oriented to person, place, and time. She appears well-developed and well-nourished. No distress.  HENT:  Head: Normocephalic and atraumatic.  Mouth/Throat: Oropharynx is clear and moist.  Eyes: EOM are normal. Pupils are equal, round, and reactive to light.  Neck: Normal range of motion.  Cardiovascular: Normal rate and normal heart sounds.   Pulmonary/Chest: Effort normal and  breath sounds normal. No respiratory distress.  Abdominal: Soft. Bowel sounds are normal. There is no tenderness.  Musculoskeletal: Normal range of motion.  Neurological: She is alert and oriented to person, place, and time. No cranial nerve deficit. She exhibits normal muscle tone. Coordination normal.  Skin: Skin is warm. No rash noted.    ED Course  Procedures (including critical care time) Labs Review Labs Reviewed  URINALYSIS, ROUTINE W REFLEX MICROSCOPIC - Abnormal; Notable for the following:    APPearance TURBID (*)    Hgb urine dipstick SMALL (*)    Leukocytes, UA SMALL (*)    All other components within normal limits  URINE MICROSCOPIC-ADD ON - Abnormal; Notable for the following:    Squamous Epithelial / LPF MANY (*)    Bacteria, UA MANY (*)    All other components within normal limits  BASIC METABOLIC PANEL - Abnormal; Notable for the following:    Glucose, Bld 267 (*)    GFR calc non Af Amer 71 (*)    GFR calc Af Amer 83 (*)    All other components within normal limits  CBC WITH DIFFERENTIAL   Results for orders placed during the hospital encounter of 10/08/13  URINALYSIS, ROUTINE W REFLEX MICROSCOPIC      Result Value Ref Range   Color, Urine YELLOW  YELLOW   APPearance TURBID (*) CLEAR   Specific Gravity, Urine 1.018  1.005 - 1.030   pH 5.0  5.0 - 8.0    Glucose, UA NEGATIVE  NEGATIVE mg/dL   Hgb urine dipstick SMALL (*) NEGATIVE   Bilirubin Urine NEGATIVE  NEGATIVE   Ketones, ur NEGATIVE  NEGATIVE mg/dL   Protein, ur NEGATIVE  NEGATIVE mg/dL   Urobilinogen, UA 0.2  0.0 - 1.0 mg/dL   Nitrite NEGATIVE  NEGATIVE   Leukocytes, UA SMALL (*) NEGATIVE  URINE MICROSCOPIC-ADD ON      Result Value Ref Range   Squamous Epithelial / LPF MANY (*) RARE   WBC, UA 3-6  <3 WBC/hpf   RBC / HPF 3-6  <3 RBC/hpf   Bacteria, UA MANY (*) RARE   Urine-Other MUCOUS PRESENT    CBC WITH DIFFERENTIAL      Result Value Ref Range   WBC 6.6  4.0 - 10.5 K/uL   RBC 4.55  3.87 - 5.11 MIL/uL   Hemoglobin 13.1  12.0 - 15.0 g/dL   HCT 40.5  36.0 - 46.0 %   MCV 89.0  78.0 - 100.0 fL   MCH 28.8  26.0 - 34.0 pg   MCHC 32.3  30.0 - 36.0 g/dL   RDW 12.8  11.5 - 15.5 %   Platelets 287  150 - 400 K/uL   Neutrophils Relative % 46  43 - 77 %   Neutro Abs 3.1  1.7 - 7.7 K/uL   Lymphocytes Relative 45  12 - 46 %   Lymphs Abs 3.0  0.7 - 4.0 K/uL   Monocytes Relative 7  3 - 12 %   Monocytes Absolute 0.4  0.1 - 1.0 K/uL   Eosinophils Relative 2  0 - 5 %   Eosinophils Absolute 0.1  0.0 - 0.7 K/uL   Basophils Relative 0  0 - 1 %   Basophils Absolute 0.0  0.0 - 0.1 K/uL  BASIC METABOLIC PANEL      Result Value Ref Range   Sodium 142  137 - 147 mEq/L   Potassium 4.5  3.7 - 5.3 mEq/L   Chloride 100  96 - 112 mEq/L   CO2 28  19 - 32 mEq/L   Glucose, Bld 267 (*) 70 - 99 mg/dL   BUN 12  6 - 23 mg/dL   Creatinine, Ser 0.90  0.50 - 1.10 mg/dL   Calcium 9.6  8.4 - 10.5 mg/dL   GFR calc non Af Amer 71 (*) >90 mL/min   GFR calc Af Amer 83 (*) >90 mL/min     Imaging Review Ct Abdomen Pelvis Wo Contrast  10/08/2013   CLINICAL DATA:  Abdominal pain, right flank pain radiating into right lower quadrant with nausea and vomiting  EXAM: CT ABDOMEN AND PELVIS WITHOUT CONTRAST  TECHNIQUE: Multidetector CT imaging of the abdomen and pelvis was performed following the standard protocol  without IV contrast.  COMPARISON:  10/27/2011  FINDINGS: Visualized portions of the lung bases are clear.  There is a 7 mm stone in the distal right ureter, approximately 3 cm proximal to the ureterovesical junction. This is causing moderate to severe dilatation of the right ureter and renal pelvis. There is mild perinephric inflammatory change and mild nephromegaly on the right. Left kidney is normal. Bladder is normal.  Liver is normal. Patient is status post cholecystectomy. Pancreas is normal. Spleen is normal. Left adrenal gland is normal. There is a 1.5 cm mass involving the right adrenal apex.  Abdominal aorta normal. Bowel and appendix normal. Reproductive organs normal. No acute musculoskeletal findings.  IMPRESSION: Acute obstructive nephropathy due to stone in the distal right ureter.  Stable 1.5 cm right adrenal mass. Based on size and stability this is considered benign.   Electronically Signed   By: Skipper Cliche M.D.   On: 10/08/2013 15:10     EKG Interpretation None      MDM   Final diagnoses:  Right flank pain  Right lower quadrant abdominal pain  Right ureteral stone    CT scan of the abdomen and pelvis is pending to rule out a kidney stone. Patient is convinced that this is consistent with a ureteral stone. As an acute onset of pain at about 8 in the morning. Associated with some nausea and some loose bowel movements. Patient states he reminded her previous stone pain. 10 out of 10. Patient was wiggling uncomfortable upon presentation. Urinalysis is contaminated. CBC and basic metabolic callus pending. CT scan is pending. Patient is more comfortable after of hydromorphone. CT scan will dictate disposition. If it is completely normal and appendix not visualized if not improve significantly could consider doing it with contrast. To rule out appendicitis. But the nature of her pain was there very typical for that.  CT scan results are back. Does show a right ureteral stone fairly  large 7 mm distal right ureter consistent with her symptoms. Patient's basic labs are still pending. If those are okay patient can be discharged home with followup with her regular Dr in urology.   Fredia Sorrow, MD 10/08/13 [place], MD 10/08/13 1517  Fredia Sorrow, MD 10/08/13 [phone]

## 2013-10-09 ENCOUNTER — Other Ambulatory Visit: Payer: Self-pay | Admitting: Family Medicine

## 2013-10-09 DIAGNOSIS — M25519 Pain in unspecified shoulder: Secondary | ICD-10-CM

## 2013-10-09 DIAGNOSIS — M25512 Pain in left shoulder: Secondary | ICD-10-CM

## 2013-10-09 DIAGNOSIS — M542 Cervicalgia: Principal | ICD-10-CM

## 2013-10-09 DIAGNOSIS — M792 Neuralgia and neuritis, unspecified: Secondary | ICD-10-CM

## 2013-10-10 ENCOUNTER — Inpatient Hospital Stay: Admission: RE | Admit: 2013-10-10 | Payer: Medicare Other | Source: Ambulatory Visit

## 2013-10-17 ENCOUNTER — Inpatient Hospital Stay: Admission: RE | Admit: 2013-10-17 | Payer: Medicare Other | Source: Ambulatory Visit

## 2013-11-12 ENCOUNTER — Ambulatory Visit
Admission: RE | Admit: 2013-11-12 | Discharge: 2013-11-12 | Disposition: A | Discharge status: Home / Self Care | Payer: Medicare HMO | Source: Ambulatory Visit | Attending: Family Medicine | Admitting: Family Medicine

## 2013-11-12 VITALS — BP 166/77 | HR 80

## 2013-11-12 DIAGNOSIS — M25519 Pain in unspecified shoulder: Secondary | ICD-10-CM

## 2013-11-12 DIAGNOSIS — M792 Neuralgia and neuritis, unspecified: Secondary | ICD-10-CM

## 2013-11-12 DIAGNOSIS — M25512 Pain in left shoulder: Secondary | ICD-10-CM

## 2013-11-12 DIAGNOSIS — M542 Cervicalgia: Secondary | ICD-10-CM

## 2013-11-12 MED ORDER — TRIAMCINOLONE ACETONIDE 40 MG/ML IJ SUSP (RADIOLOGY)
60.0000 mg | Freq: Once | INTRAMUSCULAR | Status: AC
  Administered 2013-11-12: 60 mg via EPIDURAL

## 2013-11-12 MED ORDER — IOHEXOL 300 MG/ML  SOLN
1.0000 mL | Freq: Once | INTRAMUSCULAR | Status: AC | PRN
  Administered 2013-11-12: 1 mL via EPIDURAL

## 2013-11-12 NOTE — Discharge Instructions (Signed)

## 2013-11-15 ENCOUNTER — Ambulatory Visit (INDEPENDENT_AMBULATORY_CARE_PROVIDER_SITE_OTHER): Payer: Medicare HMO | Admitting: Family Medicine

## 2013-11-15 ENCOUNTER — Encounter: Payer: Self-pay | Admitting: Family Medicine

## 2013-11-15 VITALS — BP 154/85 | Ht 69.0 in | Wt 303.0 lb

## 2013-11-15 DIAGNOSIS — IMO0002 Reserved for concepts with insufficient information to code with codable children: Secondary | ICD-10-CM

## 2013-11-15 DIAGNOSIS — M792 Neuralgia and neuritis, unspecified: Secondary | ICD-10-CM

## 2013-11-15 NOTE — Progress Notes (Signed)
Patient ID: Maria [name], female   DOB: [birth year], 53 y.o.   MRN: [id number]  Maria [name] - 53 y.o. female MRN [id number]  Date of birth: 31-Oct-1960  SUBJECTIVE:     Followup left arm and hand radicular pain. She had epidural steroid injection and is 95-99% better she was a little concerned about the steroid injection and how it might affect her diabetes and she wanted [place] to look at the area where she had the injection to make sure there were some no problem she's otherwise doing well. ROS:     No fever, sweats, chills  PERTINENT  PMH / PSH FH / / SH:  Past Medical, Surgical, Social, and Family History Reviewed & Updated in the EMR.  Pertinent findings include:  Status post bilateral TKR, diabetes mellitus, hypertension, obstructive sleep apnea, obesity.  OBJECTIVE: BP 154/85  Ht 5\' 9"  (1.753 m)  Wt 303 lb (137.44 kg)  BMI 44.72 kg/m2  LMP 04/11/2009  Physical Exam:  Vital signs are reviewed. GENERAL: Well-developed female no acute distress NECK: Full range of motion. Skin: Area of epidural steroid injection appears normal. In EXTREMITY: Upper extremity strength 5 out of 5 in all planes the rotator cuff.  ASSESSMENT & PLAN: Seems the epidural steroid injection worked quite well for her. Followup when necessary See problem based charting & AVS for pt instructions.

## 2013-11-25 ENCOUNTER — Other Ambulatory Visit: Payer: Self-pay | Admitting: *Deleted

## 2013-11-25 MED ORDER — CARISOPRODOL 350 MG PO TABS: 350.0000 mg | ORAL_TABLET | Freq: Four times a day (QID) | ORAL | Status: DC | PRN

## 2013-11-28 ENCOUNTER — Ambulatory Visit (INDEPENDENT_AMBULATORY_CARE_PROVIDER_SITE_OTHER): Payer: Medicare HMO | Admitting: Obstetrics & Gynecology

## 2013-11-28 ENCOUNTER — Encounter: Payer: Self-pay | Admitting: Obstetrics & Gynecology

## 2013-11-28 VITALS — BP 131/96 | HR 77 | Ht 68.5 in | Wt 371.2 lb

## 2013-11-28 DIAGNOSIS — N950 Postmenopausal bleeding: Secondary | ICD-10-CM

## 2013-11-28 NOTE — Progress Notes (Signed)
   Subjective:    Patient ID: Maria [name], female    DOB: March 06, 1961, 53 y.o.   MRN: [id number]  HPI S e stopped having periods 10 years ago when depo was started. She stopped depo porvera 5 years ago but still no periods. She started some light vaginal bleeding 2 days ago.   Review of Systems     Objective:   Physical Exam  UPT negative, consent signed, time out done Cervix prepped with betadine and grasped with a single tooth tenaculum Uterus sounded to 9 cm Pipelle used for 2 passes with a moderate amount of tissue obtained. She tolerated the procedure well.         Assessment & Plan:  PMB- await EMBX, schedule gyn u/s Preventative- pap at next visit, schedule mammogram

## 2013-11-28 NOTE — Progress Notes (Signed)
Post menopausal spotting.  No period for ten years was on Depo Provera then stopped Depo about four years ago and her cycles never returned then started spotting two days ago.  She also has some boils in her groin area that she is concerned about, they have been treated with doxycycline but did not go away.

## 2013-12-02 ENCOUNTER — Ambulatory Visit (HOSPITAL_COMMUNITY): Admission: RE | Admit: 2013-12-02 | Payer: Medicare HMO | Source: Ambulatory Visit

## 2013-12-03 ENCOUNTER — Ambulatory Visit: Payer: Medicare HMO | Admitting: Obstetrics & Gynecology

## 2013-12-06 ENCOUNTER — Ambulatory Visit (HOSPITAL_COMMUNITY)
Admission: RE | Admit: 2013-12-06 | Discharge: 2013-12-06 | Disposition: A | Discharge status: Home / Self Care | Payer: Medicare HMO | Source: Ambulatory Visit | Attending: Obstetrics & Gynecology | Admitting: Obstetrics & Gynecology

## 2013-12-06 DIAGNOSIS — D252 Subserosal leiomyoma of uterus: Secondary | ICD-10-CM | POA: Diagnosis not present

## 2013-12-06 DIAGNOSIS — N950 Postmenopausal bleeding: Secondary | ICD-10-CM

## 2013-12-12 ENCOUNTER — Ambulatory Visit (INDEPENDENT_AMBULATORY_CARE_PROVIDER_SITE_OTHER): Payer: Medicare HMO | Admitting: Obstetrics & Gynecology

## 2013-12-12 ENCOUNTER — Other Ambulatory Visit (HOSPITAL_COMMUNITY)
Admission: RE | Admit: 2013-12-12 | Discharge: 2013-12-12 | Disposition: A | Discharge status: Home / Self Care | Payer: Medicare HMO | Source: Ambulatory Visit | Attending: Obstetrics & Gynecology | Admitting: Obstetrics & Gynecology

## 2013-12-12 ENCOUNTER — Encounter: Payer: Self-pay | Admitting: Obstetrics & Gynecology

## 2013-12-12 VITALS — BP 141/93 | HR 81 | Ht 69.0 in | Wt 376.0 lb

## 2013-12-12 DIAGNOSIS — Z1151 Encounter for screening for human papillomavirus (HPV): Secondary | ICD-10-CM | POA: Diagnosis present

## 2013-12-12 DIAGNOSIS — R87810 Cervical high risk human papillomavirus (HPV) DNA test positive: Secondary | ICD-10-CM | POA: Diagnosis present

## 2013-12-12 DIAGNOSIS — Z23 Encounter for immunization: Secondary | ICD-10-CM

## 2013-12-12 DIAGNOSIS — Z0000 Encounter for general adult medical examination without abnormal findings: Secondary | ICD-10-CM

## 2013-12-12 DIAGNOSIS — Z01419 Encounter for gynecological examination (general) (routine) without abnormal findings: Secondary | ICD-10-CM

## 2013-12-12 DIAGNOSIS — Z124 Encounter for screening for malignant neoplasm of cervix: Secondary | ICD-10-CM | POA: Insufficient documentation

## 2013-12-17 LAB — CYTOLOGY - PAP

## 2013-12-18 NOTE — Progress Notes (Signed)
   Subjective:    Patient ID: Maria [name], female    DOB: 01-18-1961, 53 y.o.   MRN: [id number]  HPI 53 yo AA lady here for follow up after her EMBX. It showed benign pathology.   Review of Systems     Objective:   Physical Exam  EG normal Vagina-normal Cervix- normal, pap obtained Bimanual exam reveals no masses      Assessment & Plan:  Preventative- pap and mammogram

## 2013-12-20 ENCOUNTER — Ambulatory Visit: Payer: Medicare HMO

## 2013-12-26 ENCOUNTER — Inpatient Hospital Stay: Admission: RE | Admit: 2013-12-26 | Payer: Medicare HMO | Source: Ambulatory Visit

## 2014-01-03 ENCOUNTER — Other Ambulatory Visit: Payer: Self-pay | Admitting: *Deleted

## 2014-01-06 MED ORDER — COLCHICINE 0.6 MG PO TABS: 0.6000 mg | ORAL_TABLET | Freq: Every day | ORAL | Status: DC

## 2014-01-07 ENCOUNTER — Telehealth: Payer: Self-pay | Admitting: *Deleted

## 2014-01-07 MED ORDER — CEPHALEXIN 500 MG PO CAPS: 500.0000 mg | ORAL_CAPSULE | Freq: Three times a day (TID) | ORAL | Status: DC

## 2014-01-07 NOTE — Telephone Encounter (Signed)
Patient is having a recurrence of a boil in her groin.  She was told if it came back that we could call in an antibiotic for her.  I have called in Keflex 500mg  for three times a day.  Patient will call for an appointment if it does not resolve.

## 2014-01-14 ENCOUNTER — Telehealth: Payer: Self-pay | Admitting: *Deleted

## 2014-01-14 DIAGNOSIS — B379 Candidiasis, unspecified: Secondary | ICD-10-CM

## 2014-01-14 MED ORDER — FLUCONAZOLE 150 MG PO TABS: 150.0000 mg | ORAL_TABLET | Freq: Once | ORAL | Status: DC

## 2014-01-14 NOTE — Telephone Encounter (Signed)
Patient is having a yeast infection, has used Diflucan in the past with success, we will call this in for her.

## 2014-01-16 ENCOUNTER — Telehealth: Payer: Self-pay | Admitting: *Deleted

## 2014-01-16 DIAGNOSIS — B9689 Other specified bacterial agents as the cause of diseases classified elsewhere: Secondary | ICD-10-CM

## 2014-01-16 DIAGNOSIS — N760 Acute vaginitis: Principal | ICD-10-CM

## 2014-01-16 MED ORDER — METRONIDAZOLE 500 MG PO TABS: 500.0000 mg | ORAL_TABLET | Freq: Two times a day (BID) | ORAL | Status: DC

## 2014-01-16 NOTE — Telephone Encounter (Signed)
After treating yeast infection, patient is having symptoms of BV.  She would like metronidazole called in for her.

## 2014-02-10 ENCOUNTER — Encounter: Payer: Self-pay | Admitting: Obstetrics & Gynecology

## 2014-02-11 ENCOUNTER — Telehealth: Payer: Self-pay | Admitting: *Deleted

## 2014-02-11 ENCOUNTER — Other Ambulatory Visit: Payer: Self-pay | Admitting: Obstetrics & Gynecology

## 2014-02-11 DIAGNOSIS — B379 Candidiasis, unspecified: Secondary | ICD-10-CM

## 2014-02-11 MED ORDER — FLUCONAZOLE 150 MG PO TABS: 150.0000 mg | ORAL_TABLET | Freq: Once | ORAL | Status: DC

## 2014-02-11 NOTE — Telephone Encounter (Signed)
Patient called and has a yeast infection and needs something called in.   I have called in Diflucan to her pharmacy.

## 2014-02-14 MED ORDER — FLUCONAZOLE 200 MG PO TABS: 200.0000 mg | ORAL_TABLET | Freq: Every day | ORAL | Status: DC

## 2014-02-14 NOTE — Telephone Encounter (Signed)
Patient is having a persistent yeast infection and the pharmacist suggested trying 200mg  Diflucan instead of 150.  We will do this.  She is also asking about a large boil that she has on her buttock, the size of an apple and very sore.  Had her husband try to "mash" it and drain it.  I have advised her to contact her primary care physician regarding this as she has diabetes and it sounds like this might be an infection.  She agrees and will contact her primary care physician today.

## 2014-03-05 ENCOUNTER — Telehealth: Payer: Self-pay | Admitting: *Deleted

## 2014-03-05 ENCOUNTER — Telehealth: Payer: Self-pay | Admitting: Family Medicine

## 2014-03-05 MED ORDER — CARISOPRODOL 350 MG PO TABS: ORAL_TABLET | ORAL | Status: DC

## 2014-03-05 NOTE — Telephone Encounter (Signed)
Called into pharmacy

## 2014-03-05 NOTE — Telephone Encounter (Signed)
[place] or [place] Please call this in [place]! Dorcas Mcmurray

## 2014-04-01 ENCOUNTER — Encounter: Payer: Self-pay | Admitting: Sports Medicine

## 2014-04-01 ENCOUNTER — Ambulatory Visit (INDEPENDENT_AMBULATORY_CARE_PROVIDER_SITE_OTHER): Payer: Medicare HMO | Admitting: Sports Medicine

## 2014-04-01 VITALS — BP 147/92 | HR 91 | Ht 69.0 in | Wt 285.0 lb

## 2014-04-01 DIAGNOSIS — M7062 Trochanteric bursitis, left hip: Secondary | ICD-10-CM

## 2014-04-01 MED ORDER — METHYLPREDNISOLONE ACETATE 40 MG/ML IJ SUSP
40.0000 mg | Freq: Once | INTRAMUSCULAR | Status: AC
  Administered 2014-04-01: 40 mg via INTRA_ARTICULAR

## 2014-04-01 NOTE — Progress Notes (Signed)
   Subjective:    Patient ID: Allicia Culley, female    DOB: Dec 19, 1960, 53 y.o.   MRN: [id number]  HPI Ms. Berrocal is a 53 year old female who presents with left hip pain for the past year. She denies any acute injury. Location is primarily in the left posterolateral hip in the region of the greater trochanteric bursa. Symptoms are aggravated with laying on her left side or with standing for prolonged periods of time. She finds some relief with rest and using topical icy hot cream. She denies any pain that radiates down her leg. She denies any significant deep groin pain, fevers, chills, numbness, tingling, or weakness. She has a fairly complex past medical history significant for gout, diabetes, chronic low back pain status post epidural steroid injection, bilateral total knee arthroplasty, and morbid obesity. She is fairly sedentary, but seems motivated to increase her activity level. A previous x-ray of September 2012 of the abdomen shows that the bilateral hips have fairly well preserved joint space.  Past medical history, social history, medications, and allergies were reviewed and are up to date in the chart.  Review of Systems 7 point review of systems was performed and was otherwise negative unless noted in the history of present illness.     Objective:   Physical Exam BP 147/92 mmHg  Pulse 91  Ht 5\' 9"  (1.753 m)  Wt 285 lb (129.275 kg)  BMI 42.07 kg/m2  LMP 04/11/2009 GEN: The patient is well-developed well-nourished female and in no acute distress.  She is awake alert and oriented x3. SKIN: warm and well-perfused, no rash  EXTR: No lower extremity edema or calf tenderness Neuro: Strength 5/5 globally. Sensation intact throughout. No focal deficits. Vasc: +2 bilateral distal pulses. No edema.  MSK: Examination of the bilateral hips reveals full internal and external rotation with minimal pain. Negative pelvic compression test. Weakness with hip abduction more severe on the left. No leg  length discrepancy. Tenderness to palpation rectally over the left greater trochanteric bursa and some of the gluteus medius. Negative straight leg raise test. No tenderness at the left SI joint.  Procedure: The procedure was explained to the patient in detail, all questions were answered.  After obtaining informed verbal consent, the right hip was sterilely prepped with alcohol. Using strict sterile technique a 25 gauge, 1  inch needle was inserted into the point of maximal tenderness over the greater trochanter.  It was directed down to bone and then slightly off.  A mixture of 1 cc of methylprednisolone and 4 cc 1% lidocaine without epinephrine was injected without resistance. The needle was removed, and a sterile bandage was applied. The patient tolerated the procedure well and was observed for 5-10 minutes.  Post injection instructions, including signs and symptoms of complications were discussed.     Assessment & Plan:  Please see problem based assessment and plan in the problem list.

## 2014-04-01 NOTE — Patient Instructions (Signed)
Greater Trochanteric Bursitis of Left Hip -Injected today, be mindful of blood sugars over the next couple days -The following exercises/stretches may provide relief as well 1. Laying on right side, left leg straight raises, 10 times x 3 repeats once daily 2. Standing with a wall to your left, cross right leg over left, lean into wall, stretching left IT band, hold 5-10 seconds, repeat 3-5 times once daily 3. Partial crunches, 15-20 times, once daily on most days 4. Gentle aerobic activity as tolerated  -You can also ice the area as a good anti-inflammatory as well.  -Plan follow-up in 3 months or sooner if persists.

## 2014-04-18 ENCOUNTER — Telehealth: Payer: Self-pay | Admitting: *Deleted

## 2014-04-18 DIAGNOSIS — B9689 Other specified bacterial agents as the cause of diseases classified elsewhere: Secondary | ICD-10-CM

## 2014-04-18 DIAGNOSIS — N760 Acute vaginitis: Principal | ICD-10-CM

## 2014-04-18 MED ORDER — METRONIDAZOLE 500 MG PO TABS: 500.0000 mg | ORAL_TABLET | Freq: Two times a day (BID) | ORAL | Status: DC

## 2014-04-18 NOTE — Telephone Encounter (Signed)
Patient called and needed a refill on Flagyl for bacterial vaginosis.  I have sent patient in refills.

## 2014-04-20 ENCOUNTER — Emergency Department (HOSPITAL_BASED_OUTPATIENT_CLINIC_OR_DEPARTMENT_OTHER)
Admission: EM | Admit: 2014-04-20 | Discharge: 2014-04-20 | Disposition: A | Discharge status: Home / Self Care | Payer: PPO | Attending: Emergency Medicine | Admitting: Emergency Medicine

## 2014-04-20 DIAGNOSIS — K219 Gastro-esophageal reflux disease without esophagitis: Secondary | ICD-10-CM | POA: Diagnosis not present

## 2014-04-20 DIAGNOSIS — R109 Unspecified abdominal pain: Secondary | ICD-10-CM

## 2014-04-20 DIAGNOSIS — Z87440 Personal history of urinary (tract) infections: Secondary | ICD-10-CM | POA: Diagnosis not present

## 2014-04-20 DIAGNOSIS — R319 Hematuria, unspecified: Secondary | ICD-10-CM | POA: Diagnosis not present

## 2014-04-20 DIAGNOSIS — Z79899 Other long term (current) drug therapy: Secondary | ICD-10-CM | POA: Insufficient documentation

## 2014-04-20 DIAGNOSIS — M109 Gout, unspecified: Secondary | ICD-10-CM | POA: Insufficient documentation

## 2014-04-20 DIAGNOSIS — Z792 Long term (current) use of antibiotics: Secondary | ICD-10-CM | POA: Diagnosis not present

## 2014-04-20 DIAGNOSIS — F329 Major depressive disorder, single episode, unspecified: Secondary | ICD-10-CM | POA: Insufficient documentation

## 2014-04-20 DIAGNOSIS — I10 Essential (primary) hypertension: Secondary | ICD-10-CM | POA: Diagnosis not present

## 2014-04-20 DIAGNOSIS — F419 Anxiety disorder, unspecified: Secondary | ICD-10-CM | POA: Insufficient documentation

## 2014-04-20 DIAGNOSIS — N179 Acute kidney failure, unspecified: Secondary | ICD-10-CM | POA: Insufficient documentation

## 2014-04-20 DIAGNOSIS — E119 Type 2 diabetes mellitus without complications: Secondary | ICD-10-CM | POA: Diagnosis not present

## 2014-04-20 DIAGNOSIS — I509 Heart failure, unspecified: Secondary | ICD-10-CM | POA: Insufficient documentation

## 2014-04-20 DIAGNOSIS — Z794 Long term (current) use of insulin: Secondary | ICD-10-CM | POA: Insufficient documentation

## 2014-04-20 DIAGNOSIS — Z87442 Personal history of urinary calculi: Secondary | ICD-10-CM | POA: Diagnosis not present

## 2014-04-20 LAB — BASIC METABOLIC PANEL
Anion gap: 8 (ref 5–15)
BUN: 22 mg/dL (ref 6–23)
CO2: 25 mmol/L (ref 19–32)
Calcium: 9.3 mg/dL (ref 8.4–10.5)
Chloride: 101 mEq/L (ref 96–112)
Creatinine, Ser: 1.37 mg/dL — ABNORMAL HIGH (ref 0.50–1.10)
GFR calc Af Amer: 50 mL/min — ABNORMAL LOW (ref >90)
GFR calc non Af Amer: 43 mL/min — ABNORMAL LOW (ref >90)
Glucose, Bld: 226 mg/dL — ABNORMAL HIGH (ref 70–99)
Potassium: 4.1 mmol/L (ref 3.5–5.1)
Sodium: 134 mmol/L — ABNORMAL LOW (ref 135–145)

## 2014-04-20 LAB — URINE MICROSCOPIC-ADD ON

## 2014-04-20 LAB — URINALYSIS, ROUTINE W REFLEX MICROSCOPIC
Bilirubin Urine: NEGATIVE
Glucose, UA: 100 mg/dL — AB
Ketones, ur: NEGATIVE mg/dL
Nitrite: NEGATIVE
Protein, ur: 100 mg/dL — AB
Specific Gravity, Urine: 1.028 (ref 1.005–1.030)
Urobilinogen, UA: 0.2 mg/dL (ref 0.0–1.0)
pH: 6.5 (ref 5.0–8.0)

## 2014-04-20 MED ORDER — SODIUM CHLORIDE 0.9 % IV BOLUS (SEPSIS)
500.0000 mL | Freq: Once | INTRAVENOUS | Status: AC
  Administered 2014-04-20: 500 mL via INTRAVENOUS

## 2014-04-20 MED ORDER — OXYCODONE-ACETAMINOPHEN 5-325 MG PO TABS: 1.0000 | ORAL_TABLET | ORAL | Status: DC | PRN

## 2014-04-20 MED ORDER — HYDROMORPHONE HCL 1 MG/ML IJ SOLN
1.0000 mg | Freq: Once | INTRAMUSCULAR | Status: AC
  Administered 2014-04-20: 1 mg via INTRAVENOUS
  Filled 2014-04-20: qty 1

## 2014-04-20 MED ORDER — HYDROMORPHONE HCL 1 MG/ML IJ SOLN
0.5000 mg | Freq: Once | INTRAMUSCULAR | Status: AC
  Administered 2014-04-20: 0.5 mg via INTRAVENOUS
  Filled 2014-04-20: qty 1

## 2014-04-20 MED ORDER — ONDANSETRON HCL 4 MG/2ML IJ SOLN
4.0000 mg | Freq: Once | INTRAMUSCULAR | Status: AC
Start: 2014-04-20 — End: 2014-04-20
  Administered 2014-04-20: 4 mg via INTRAVENOUS
  Filled 2014-04-20: qty 2

## 2014-04-20 MED ORDER — ONDANSETRON 4 MG PO TBDP: ORAL_TABLET | ORAL | Status: DC

## 2014-04-20 NOTE — ED Provider Notes (Addendum)
CSN: [id number]     Arrival date & time 04/20/14  0449 History   First MD Initiated Contact with Patient 04/20/14 [phone]     Chief Complaint  Patient presents with  . Flank Pain     (Consider location/radiation/quality/duration/timing/severity/associated sxs/prior Treatment) HPI Comments: 54 year old female diabetes, kidney stones, gout, lipids, depression, urine infection presents with acute onset right flank/posterior back pain since 11:00 last night. This is similar to previous kidney stone, gradually worsening, mild blood in the urine. No fevers or chills. Mild nausea with the pain. Patient passed the majority of her kidney stones however did require urology assistance on 2 of them. Pain intermittent.  Patient is a 54 y.o. female presenting with flank pain. The history is provided by the patient.  Flank Pain Pertinent negatives include no chest pain, no abdominal pain, no headaches and no shortness of breath.    Past Medical History  Diagnosis Date  . Hypertension   . Hyperlipidemia   . Diabetes mellitus   . Gout   . Nephrolithiasis     recurrent  . Osteoarthritis, knee   . GERD (gastroesophageal reflux disease)   . Depression   . Rotator cuff syndrome of left shoulder   . Carpal tunnel syndrome, left   . Anxiety   . DUB (dysfunctional uterine bleeding)   . Allergic rhinitis   . External hemorrhoid   . Morbid obesity   . Recurrent UTI   . CHF (congestive heart failure)    Past Surgical History  Procedure Laterality Date  . Lithotripsy    . Replacement total knee bilateral    . Cholecystectomy    . Tubal ligation     Family History  Problem Relation Age of Onset  . Coronary artery disease    . Hypertension    . Diabetes    . Obesity    . Diabetes Mother   . Hypertension Mother    History  Substance Use Topics  . Smoking status: Never Smoker   . Smokeless tobacco: Never Used  . Alcohol Use: No   OB History    Gravida Para Term Preterm AB TAB SAB Ectopic  Multiple Living   4         3      Obstetric Comments   Patient had two twin pregnancies.  One Pregnancy resulted in miscarriage completely and second twin pregnancy resulted in one live birth.  She also had a son that was killed in the [employer].     Review of Systems  Constitutional: Positive for appetite change. Negative for fever and chills.  HENT: Negative for congestion.   Eyes: Negative for visual disturbance.  Respiratory: Negative for shortness of breath.   Cardiovascular: Negative for chest pain.  Gastrointestinal: Negative for vomiting and abdominal pain.  Genitourinary: Positive for flank pain. Negative for dysuria.  Musculoskeletal: Negative for back pain, neck pain and neck stiffness.  Skin: Negative for rash.  Neurological: Negative for light-headedness and headaches.      Allergies  Propoxyphene n-acetaminophen  Home Medications   Prior to Admission medications   Medication Sig Start Date End Date Taking? Authorizing Provider  ALPRAZolam Duanne Moron) 1 MG tablet  10/18/11   Historical Provider, MD  benazepril-hydrochlorthiazide (LOTENSIN HCT) 20-25 MG per tablet Take by mouth.    Historical Provider, MD  carisoprodol (SOMA) 350 MG tablet Take one tablet by mouth twice a day as needed for pain and spasm 03/05/14   Dickie La, MD  cephALEXin (KEFLEX) 500 MG  capsule Take 1 capsule (500 mg total) by mouth 3 (three) times daily. 01/07/14   Emily Filbert, MD  colchicine (COLCRYS) 0.6 MG tablet Take 1 tablet (0.6 mg total) by mouth daily. 01/06/14   Dickie La, MD  fluconazole (DIFLUCAN) 150 MG tablet TAKE 1 TABLET (150 MG TOTAL) BY MOUTH ONCE. REPEAT IN TWO TO THREE DAYS IF SYMPTOMS PERSIST. 02/14/14   Emily Filbert, MD  fluconazole (DIFLUCAN) 200 MG tablet Take 1 tablet (200 mg total) by mouth daily. Repeat in three days if symptoms persist. 02/14/14   Emily Filbert, MD  glipiZIDE (GLUCOTROL) 5 MG tablet Take 5 mg by mouth 2 (two) times daily.      Historical Provider, MD   hydrochlorothiazide 25 MG tablet Take 1 tablet (25 mg total) by mouth daily. 06/02/10 11/28/13  Nneka Isamah  insulin glargine (LANTUS) 100 UNIT/ML injection Inject 60 units under the skin at bedtime 07/15/10   Vertell Limber, MD  Insulin Pen Needle (B-D ULTRAFINE III SHORT PEN) 31G X 8 MM MISC Use as directed once daily with Lantus solostar pen 06/02/10   Nneka Isamah  metFORMIN (GLUCOPHAGE) 1000 MG tablet Take 1,000 mg by mouth.    Historical Provider, MD  metoprolol (LOPRESSOR) 100 MG tablet  09/08/11   Historical Provider, MD  metoprolol (TOPROL-XL) 200 MG 24 hr tablet Take 200 mg by mouth.    Historical Provider, MD  metroNIDAZOLE (FLAGYL) 500 MG tablet Take 1 tablet (500 mg total) by mouth 2 (two) times daily. 01/16/14   Emily Filbert, MD  metroNIDAZOLE (FLAGYL) 500 MG tablet Take 1 tablet (500 mg total) by mouth 2 (two) times daily. 04/18/14   Emily Filbert, MD  omeprazole (PRILOSEC) 20 MG capsule Take 1 capsule (20 mg total) by mouth 2 (two) times daily. 05/12/13   [name], MD  ondansetron (ZOFRAN ODT) 4 MG disintegrating tablet 4mg  ODT q4 hours prn nausea/vomit 04/20/14   Mariea Clonts, MD  oxyCODONE-acetaminophen (PERCOCET/ROXICET) 5-325 MG per tablet Take 1-2 tablets by mouth every 4 (four) hours as needed for moderate pain or severe pain. 04/20/14   Mariea Clonts, MD  pantoprazole (PROTONIX) 40 MG tablet Take 40 mg by mouth.    Historical Provider, MD  promethazine (PHENERGAN) 25 MG tablet Take 1 tablet (25 mg total) by mouth every 6 (six) hours as needed for nausea or vomiting. 10/08/13   Fredia Sorrow, MD   BP 153/47 mmHg  Pulse 73  Temp(Src) 98.3 F (36.8 C) (Oral)  Resp 24  SpO2 99%  LMP 04/11/2009 Physical Exam  Constitutional: She is oriented to person, place, and time. She appears well-developed and well-nourished.  HENT:  Head: Normocephalic and atraumatic.  Eyes: Conjunctivae are normal. Right eye exhibits no discharge. Left eye exhibits no discharge.  Neck: Normal range of  motion. Neck supple. No tracheal deviation present.  Cardiovascular: Normal rate and regular rhythm.   Pulmonary/Chest: Effort normal and breath sounds normal.  Abdominal: Soft. She exhibits no distension. There is no tenderness. There is no guarding.  Musculoskeletal: She exhibits no edema.  Neurological: She is alert and oriented to person, place, and time.  Skin: Skin is warm. No rash noted.  Psychiatric: She has a normal mood and affect.  Nursing note and vitals reviewed.   ED Course  Procedures (including critical care time) Emergency Focused Ultrasound Exam Limited retroperitoneal ultrasound of kidneys  Performed and interpreted by Dr. Reather Converse Indication: flank pain Focused abdominal ultrasound with both kidneys imaged  in transverse and longitudinal planes in real-time. Interpretation: Mild right hydronephrosis visualized.   Difficult views due to body habitus Images archived electronically  Labs Review Labs Reviewed  URINALYSIS, ROUTINE W REFLEX MICROSCOPIC - Abnormal; Notable for the following:    Color, Urine RED (*)    APPearance CLOUDY (*)    Glucose, UA 100 (*)    Hgb urine dipstick LARGE (*)    Protein, ur 100 (*)    Leukocytes, UA SMALL (*)    All other components within normal limits  BASIC METABOLIC PANEL - Abnormal; Notable for the following:    Sodium 134 (*)    Glucose, Bld 226 (*)    Creatinine, Ser 1.37 (*)    GFR calc non Af Amer 43 (*)    GFR calc Af Amer 50 (*)    All other components within normal limits  URINE MICROSCOPIC-ADD ON - Abnormal; Notable for the following:    Squamous Epithelial / LPF MANY (*)    Bacteria, UA FEW (*)    All other components within normal limits  URINE CULTURE    Imaging Review No results found.   EKG Interpretation None      MDM   Final diagnoses:  Acute right flank pain  Hematuria  Acute renal failure, unspecified acute renal failure type   Clinically concern for kidney stone based on history and  presentation. Nausea meds, pain meds and small IV fluid bolus. Bedside ultrasound, discussed holding and CT scan if able to control symptoms due to radiation risk and patient had multiple CT scans for similar. Patient comfortable on recheck pain controlled no vomiting. Likely mild hydronephrosis bedside ultrasound. Discussed reasons to return and outpatient follow-up. Results and differential diagnosis were discussed with the patient/parent/guardian. Close follow up outpatient was discussed, comfortable with the plan.   Medications  HYDROmorphone (DILAUDID) injection 1 mg (1 mg Intravenous Given 04/20/14 0525)  ondansetron (ZOFRAN) injection 4 mg (4 mg Intravenous Given 04/20/14 0525)  sodium chloride 0.9 % bolus 500 mL (500 mLs Intravenous New Bag/Given 04/20/14 0525)    Filed Vitals:   04/20/14 0508  BP: 153/47  Pulse: 73  Temp: 98.3 F (36.8 C)  TempSrc: Oral  Resp: 24  SpO2: 99%    Final diagnoses:  Acute right flank pain  Hematuria  Acute renal failure, unspecified acute renal failure type        Mariea Clonts, MD 04/20/14 [id number]  Mariea Clonts, MD 04/20/14 [phone]

## 2014-04-20 NOTE — ED Notes (Signed)
Pt reports sudden onset right flank pain at 2300 last PM

## 2014-04-20 NOTE — Discharge Instructions (Signed)
If you were given medicines take as directed.  If you are on coumadin or contraceptives realize their levels and effectiveness is altered by many different medicines.  If you have any reaction (rash, tongues swelling, other) to the medicines stop taking and see a physician.   Please follow up as directed and return to the ER or see a physician for new or worsening symptoms.  Thank you. Filed Vitals:   04/20/14 0508  BP: 153/47  Pulse: 73  Temp: 98.3 F (36.8 C)  TempSrc: Oral  Resp: 24  SpO2: 99%

## 2014-04-21 LAB — URINE CULTURE: Colony Count: 75000

## 2014-05-12 DIAGNOSIS — Z872 Personal history of diseases of the skin and subcutaneous tissue: Secondary | ICD-10-CM

## 2014-05-12 HISTORY — DX: Personal history of diseases of the skin and subcutaneous tissue: Z87.2

## 2014-05-31 ENCOUNTER — Encounter (HOSPITAL_BASED_OUTPATIENT_CLINIC_OR_DEPARTMENT_OTHER): Payer: Self-pay | Admitting: *Deleted

## 2014-05-31 ENCOUNTER — Emergency Department (HOSPITAL_BASED_OUTPATIENT_CLINIC_OR_DEPARTMENT_OTHER): Payer: PPO

## 2014-05-31 ENCOUNTER — Inpatient Hospital Stay (HOSPITAL_BASED_OUTPATIENT_CLINIC_OR_DEPARTMENT_OTHER)
Admission: EM | Admit: 2014-05-31 | Discharge: 2014-06-02 | DRG: 571 | Disposition: A | Discharge status: Home Health | Payer: PPO | Attending: Internal Medicine | Admitting: Internal Medicine

## 2014-05-31 DIAGNOSIS — Z794 Long term (current) use of insulin: Secondary | ICD-10-CM | POA: Diagnosis not present

## 2014-05-31 DIAGNOSIS — Z9049 Acquired absence of other specified parts of digestive tract: Secondary | ICD-10-CM | POA: Diagnosis present

## 2014-05-31 DIAGNOSIS — K219 Gastro-esophageal reflux disease without esophagitis: Secondary | ICD-10-CM | POA: Diagnosis present

## 2014-05-31 DIAGNOSIS — I10 Essential (primary) hypertension: Secondary | ICD-10-CM | POA: Diagnosis present

## 2014-05-31 DIAGNOSIS — N200 Calculus of kidney: Secondary | ICD-10-CM | POA: Diagnosis present

## 2014-05-31 DIAGNOSIS — L0291 Cutaneous abscess, unspecified: Secondary | ICD-10-CM

## 2014-05-31 DIAGNOSIS — Z792 Long term (current) use of antibiotics: Secondary | ICD-10-CM

## 2014-05-31 DIAGNOSIS — E785 Hyperlipidemia, unspecified: Secondary | ICD-10-CM | POA: Diagnosis present

## 2014-05-31 DIAGNOSIS — M179 Osteoarthritis of knee, unspecified: Secondary | ICD-10-CM | POA: Diagnosis present

## 2014-05-31 DIAGNOSIS — N179 Acute kidney failure, unspecified: Secondary | ICD-10-CM | POA: Diagnosis present

## 2014-05-31 DIAGNOSIS — L03311 Cellulitis of abdominal wall: Secondary | ICD-10-CM | POA: Diagnosis present

## 2014-05-31 DIAGNOSIS — R109 Unspecified abdominal pain: Secondary | ICD-10-CM | POA: Diagnosis present

## 2014-05-31 DIAGNOSIS — Z8249 Family history of ischemic heart disease and other diseases of the circulatory system: Secondary | ICD-10-CM | POA: Diagnosis not present

## 2014-05-31 DIAGNOSIS — E1165 Type 2 diabetes mellitus with hyperglycemia: Secondary | ICD-10-CM | POA: Diagnosis present

## 2014-05-31 DIAGNOSIS — Z888 Allergy status to other drugs, medicaments and biological substances status: Secondary | ICD-10-CM

## 2014-05-31 DIAGNOSIS — N390 Urinary tract infection, site not specified: Secondary | ICD-10-CM | POA: Diagnosis present

## 2014-05-31 DIAGNOSIS — F329 Major depressive disorder, single episode, unspecified: Secondary | ICD-10-CM

## 2014-05-31 DIAGNOSIS — Z6841 Body Mass Index (BMI) 40.0 and over, adult: Secondary | ICD-10-CM | POA: Diagnosis not present

## 2014-05-31 DIAGNOSIS — F32A Depression, unspecified: Secondary | ICD-10-CM | POA: Diagnosis present

## 2014-05-31 DIAGNOSIS — G4733 Obstructive sleep apnea (adult) (pediatric): Secondary | ICD-10-CM | POA: Diagnosis present

## 2014-05-31 DIAGNOSIS — Z79899 Other long term (current) drug therapy: Secondary | ICD-10-CM

## 2014-05-31 DIAGNOSIS — Z833 Family history of diabetes mellitus: Secondary | ICD-10-CM

## 2014-05-31 DIAGNOSIS — L0390 Cellulitis, unspecified: Secondary | ICD-10-CM | POA: Diagnosis present

## 2014-05-31 DIAGNOSIS — IMO0002 Reserved for concepts with insufficient information to code with codable children: Secondary | ICD-10-CM | POA: Diagnosis present

## 2014-05-31 DIAGNOSIS — E871 Hypo-osmolality and hyponatremia: Secondary | ICD-10-CM | POA: Diagnosis present

## 2014-05-31 DIAGNOSIS — L02211 Cutaneous abscess of abdominal wall: Secondary | ICD-10-CM | POA: Diagnosis present

## 2014-05-31 DIAGNOSIS — Z87440 Personal history of urinary (tract) infections: Secondary | ICD-10-CM | POA: Diagnosis not present

## 2014-05-31 DIAGNOSIS — Z79891 Long term (current) use of opiate analgesic: Secondary | ICD-10-CM | POA: Diagnosis not present

## 2014-05-31 DIAGNOSIS — F418 Other specified anxiety disorders: Secondary | ICD-10-CM | POA: Diagnosis present

## 2014-05-31 DIAGNOSIS — M109 Gout, unspecified: Secondary | ICD-10-CM | POA: Diagnosis present

## 2014-05-31 DIAGNOSIS — F419 Anxiety disorder, unspecified: Secondary | ICD-10-CM | POA: Diagnosis present

## 2014-05-31 DIAGNOSIS — M1710 Unilateral primary osteoarthritis, unspecified knee: Secondary | ICD-10-CM | POA: Diagnosis present

## 2014-05-31 DIAGNOSIS — Z87442 Personal history of urinary calculi: Secondary | ICD-10-CM | POA: Diagnosis not present

## 2014-05-31 HISTORY — DX: Obesity, unspecified: E66.9

## 2014-05-31 LAB — CBC
HCT: 33.7 % — ABNORMAL LOW (ref 36.0–46.0)
Hemoglobin: 10.9 g/dL — ABNORMAL LOW (ref 12.0–15.0)
MCH: 27.5 pg (ref 26.0–34.0)
MCHC: 32.3 g/dL (ref 30.0–36.0)
MCV: 84.9 fL (ref 78.0–100.0)
Platelets: 397 10*3/uL (ref 150–400)
RBC: 3.97 MIL/uL (ref 3.87–5.11)
RDW: 13.7 % (ref 11.5–15.5)
WBC: 7.8 10*3/uL (ref 4.0–10.5)

## 2014-05-31 LAB — COMPREHENSIVE METABOLIC PANEL
ALT: 10 U/L (ref 0–35)
AST: 14 U/L (ref 0–37)
Albumin: 3.2 g/dL — ABNORMAL LOW (ref 3.5–5.2)
Alkaline Phosphatase: 101 U/L (ref 39–117)
Anion gap: 9 (ref 5–15)
BUN: 14 mg/dL (ref 6–23)
CO2: 25 mmol/L (ref 19–32)
Calcium: 8.5 mg/dL (ref 8.4–10.5)
Chloride: 98 mmol/L (ref 96–112)
Creatinine, Ser: 1.08 mg/dL (ref 0.50–1.10)
GFR calc Af Amer: 66 mL/min — ABNORMAL LOW (ref >90)
GFR calc non Af Amer: 57 mL/min — ABNORMAL LOW (ref >90)
Glucose, Bld: 343 mg/dL — ABNORMAL HIGH (ref 70–99)
Potassium: 4.1 mmol/L (ref 3.5–5.1)
Sodium: 132 mmol/L — ABNORMAL LOW (ref 135–145)
Total Bilirubin: 0.3 mg/dL (ref 0.3–1.2)
Total Protein: 8.1 g/dL (ref 6.0–8.3)

## 2014-05-31 LAB — CBC WITH DIFFERENTIAL/PLATELET
Basophils Absolute: 0 10*3/uL (ref 0.0–0.1)
Basophils Relative: 0 % (ref 0–1)
Eosinophils Absolute: 0.1 10*3/uL (ref 0.0–0.7)
Eosinophils Relative: 1 % (ref 0–5)
HCT: 38.2 % (ref 36.0–46.0)
Hemoglobin: 12.2 g/dL (ref 12.0–15.0)
Lymphocytes Relative: 23 % (ref 12–46)
Lymphs Abs: 2 10*3/uL (ref 0.7–4.0)
MCH: 27.2 pg (ref 26.0–34.0)
MCHC: 31.9 g/dL (ref 30.0–36.0)
MCV: 85.1 fL (ref 78.0–100.0)
Monocytes Absolute: 0.8 10*3/uL (ref 0.1–1.0)
Monocytes Relative: 9 % (ref 3–12)
Neutro Abs: 5.6 10*3/uL (ref 1.7–7.7)
Neutrophils Relative %: 67 % (ref 43–77)
Platelets: 443 10*3/uL — ABNORMAL HIGH (ref 150–400)
RBC: 4.49 MIL/uL (ref 3.87–5.11)
RDW: 13.7 % (ref 11.5–15.5)
WBC: 8.4 10*3/uL (ref 4.0–10.5)

## 2014-05-31 LAB — CREATININE, SERUM
Creatinine, Ser: 1.02 mg/dL (ref 0.50–1.10)
GFR calc Af Amer: 70 mL/min — ABNORMAL LOW (ref >90)
GFR calc non Af Amer: 61 mL/min — ABNORMAL LOW (ref >90)

## 2014-05-31 LAB — GLUCOSE, CAPILLARY
Glucose-Capillary: 279 mg/dL — ABNORMAL HIGH (ref 70–99)
Glucose-Capillary: 269 mg/dL — ABNORMAL HIGH (ref 70–99)

## 2014-05-31 LAB — LIPASE, BLOOD: Lipase: 24 U/L (ref 11–59)

## 2014-05-31 MED ORDER — OXYCODONE HCL 5 MG PO TABS: 15.0000 mg | ORAL_TABLET | Freq: Four times a day (QID) | ORAL | Status: DC | PRN

## 2014-05-31 MED ORDER — METOPROLOL SUCCINATE ER 100 MG PO TB24
200.0000 mg | ORAL_TABLET | Freq: Every day | ORAL | Status: DC
Start: 2014-05-31 — End: 2014-05-31

## 2014-05-31 MED ORDER — DOCUSATE SODIUM 100 MG PO CAPS
100.0000 mg | ORAL_CAPSULE | Freq: Two times a day (BID) | ORAL | Status: DC
  Administered 2014-05-31 – 2014-06-02 (×4): 100 mg via ORAL
  Filled 2014-05-31 (×7): qty 1

## 2014-05-31 MED ORDER — OXYCODONE HCL 5 MG PO TABS
15.0000 mg | ORAL_TABLET | Freq: Four times a day (QID) | ORAL | Status: DC | PRN
  Administered 2014-05-31: 15 mg via ORAL
  Filled 2014-05-31: qty 3

## 2014-05-31 MED ORDER — COLCHICINE 0.6 MG PO TABS
0.6000 mg | ORAL_TABLET | Freq: Every day | ORAL | Status: DC
  Administered 2014-05-31 – 2014-06-02 (×3): 0.6 mg via ORAL
  Filled 2014-05-31 (×3): qty 1

## 2014-05-31 MED ORDER — INSULIN ASPART 100 UNIT/ML ~~LOC~~ SOLN
0.0000 [IU] | Freq: Every day | SUBCUTANEOUS | Status: DC
  Administered 2014-05-31: 3 [IU] via SUBCUTANEOUS
  Administered 2014-06-01: 2 [IU] via SUBCUTANEOUS

## 2014-05-31 MED ORDER — METFORMIN HCL 500 MG PO TABS
1000.0000 mg | ORAL_TABLET | Freq: Two times a day (BID) | ORAL | Status: DC
  Filled 2014-05-31 (×3): qty 2

## 2014-05-31 MED ORDER — CLINDAMYCIN PHOSPHATE 600 MG/50ML IV SOLN
600.0000 mg | Freq: Once | INTRAVENOUS | Status: AC
  Administered 2014-05-31: 600 mg via INTRAVENOUS
  Filled 2014-05-31: qty 50

## 2014-05-31 MED ORDER — SODIUM CHLORIDE 0.9 % IV SOLN
2500.0000 mg | Freq: Once | INTRAVENOUS | Status: AC
  Administered 2014-05-31: 2500 mg via INTRAVENOUS
  Filled 2014-05-31: qty 2500

## 2014-05-31 MED ORDER — INSULIN DETEMIR 100 UNIT/ML ~~LOC~~ SOLN
60.0000 [IU] | Freq: Every day | SUBCUTANEOUS | Status: DC
  Administered 2014-05-31: 60 [IU] via SUBCUTANEOUS
  Filled 2014-05-31: qty 0.6

## 2014-05-31 MED ORDER — CARISOPRODOL 350 MG PO TABS
350.0000 mg | ORAL_TABLET | Freq: Two times a day (BID) | ORAL | Status: DC | PRN
  Administered 2014-06-01: 350 mg via ORAL
  Filled 2014-05-31: qty 1

## 2014-05-31 MED ORDER — VANCOMYCIN HCL 10 G IV SOLR
1250.0000 mg | Freq: Two times a day (BID) | INTRAVENOUS | Status: DC
  Administered 2014-06-01 – 2014-06-02 (×3): 1250 mg via INTRAVENOUS
  Filled 2014-05-31 (×5): qty 1250

## 2014-05-31 MED ORDER — ENOXAPARIN SODIUM 80 MG/0.8ML ~~LOC~~ SOLN
0.5000 mg/kg | SUBCUTANEOUS | Status: DC
  Administered 2014-05-31: 80 mg via SUBCUTANEOUS
  Filled 2014-05-31 (×2): qty 0.8

## 2014-05-31 MED ORDER — MORPHINE SULFATE 4 MG/ML IJ SOLN
4.0000 mg | Freq: Once | INTRAMUSCULAR | Status: AC
  Administered 2014-05-31: 4 mg via INTRAVENOUS
  Filled 2014-05-31: qty 1

## 2014-05-31 MED ORDER — ENOXAPARIN SODIUM 40 MG/0.4ML ~~LOC~~ SOLN
40.0000 mg | SUBCUTANEOUS | Status: DC
Start: 2014-05-31 — End: 2014-05-31

## 2014-05-31 MED ORDER — SODIUM CHLORIDE 0.9 % IV SOLN
Freq: Once | INTRAVENOUS | Status: AC
  Administered 2014-05-31: 15:00:00 via INTRAVENOUS

## 2014-05-31 MED ORDER — ONDANSETRON HCL 4 MG/2ML IJ SOLN: 4.0000 mg | Freq: Four times a day (QID) | INTRAMUSCULAR | Status: DC | PRN

## 2014-05-31 MED ORDER — ONDANSETRON HCL 4 MG/2ML IJ SOLN
4.0000 mg | Freq: Once | INTRAMUSCULAR | Status: AC
  Administered 2014-05-31: 4 mg via INTRAVENOUS
  Filled 2014-05-31: qty 2

## 2014-05-31 MED ORDER — PANTOPRAZOLE SODIUM 40 MG PO TBEC
40.0000 mg | DELAYED_RELEASE_TABLET | Freq: Every day | ORAL | Status: DC
  Administered 2014-05-31 – 2014-06-02 (×3): 40 mg via ORAL
  Filled 2014-05-31 (×3): qty 1

## 2014-05-31 MED ORDER — METFORMIN HCL 500 MG PO TABS: 1000.0000 mg | ORAL_TABLET | Freq: Two times a day (BID) | ORAL | Status: DC

## 2014-05-31 MED ORDER — IOHEXOL 300 MG/ML  SOLN
100.0000 mL | Freq: Once | INTRAMUSCULAR | Status: AC | PRN
  Administered 2014-05-31: 100 mL via INTRAVENOUS

## 2014-05-31 MED ORDER — PROMETHAZINE HCL 25 MG PO TABS: 25.0000 mg | ORAL_TABLET | Freq: Four times a day (QID) | ORAL | Status: DC | PRN

## 2014-05-31 MED ORDER — VANCOMYCIN HCL IN DEXTROSE 1-5 GM/200ML-% IV SOLN: 1000.0000 mg | Freq: Once | INTRAVENOUS | Status: DC

## 2014-05-31 MED ORDER — INSULIN ASPART 100 UNIT/ML ~~LOC~~ SOLN
0.0000 [IU] | Freq: Three times a day (TID) | SUBCUTANEOUS | Status: DC
  Administered 2014-06-01: 3 [IU] via SUBCUTANEOUS
  Administered 2014-06-01 – 2014-06-02 (×2): 5 [IU] via SUBCUTANEOUS
  Administered 2014-06-02: 3 [IU] via SUBCUTANEOUS

## 2014-05-31 MED ORDER — METOPROLOL SUCCINATE ER 100 MG PO TB24
200.0000 mg | ORAL_TABLET | Freq: Every day | ORAL | Status: DC
  Administered 2014-05-31 – 2014-06-02 (×3): 200 mg via ORAL
  Filled 2014-05-31 (×3): qty 2

## 2014-05-31 MED ORDER — HYDROCHLOROTHIAZIDE 25 MG PO TABS
25.0000 mg | ORAL_TABLET | Freq: Every day | ORAL | Status: DC
  Administered 2014-05-31 – 2014-06-02 (×3): 25 mg via ORAL
  Filled 2014-05-31 (×3): qty 1

## 2014-05-31 MED ORDER — SODIUM CHLORIDE 0.9 % IV SOLN
INTRAVENOUS | Status: AC
  Administered 2014-05-31 – 2014-06-01 (×2): via INTRAVENOUS

## 2014-05-31 MED ORDER — ONDANSETRON HCL 4 MG PO TABS: 4.0000 mg | ORAL_TABLET | Freq: Four times a day (QID) | ORAL | Status: DC | PRN

## 2014-05-31 MED ORDER — ALPRAZOLAM 0.5 MG PO TABS
1.0000 mg | ORAL_TABLET | Freq: Three times a day (TID) | ORAL | Status: DC | PRN
  Administered 2014-06-01: 1 mg via ORAL
  Filled 2014-05-31 (×2): qty 2

## 2014-05-31 MED ORDER — BENAZEPRIL HCL 40 MG PO TABS
40.0000 mg | ORAL_TABLET | Freq: Every day | ORAL | Status: DC
  Administered 2014-05-31 – 2014-06-01 (×2): 40 mg via ORAL
  Filled 2014-05-31 (×3): qty 1

## 2014-05-31 NOTE — H&P (Signed)
[facility] History and Physical  [name] BHA:[id number] DOB: 07-06-1960 DOA: 05/31/2014  Referring physician: ED physician PCP: Ricke Hey, MD   Chief Complaint: worsening abdominal wall pain  HPI:  Maria [name] is a 54yo woman with PMH of DM2 on insulin, HTN, kidney stones and recurrent UTIs, Depression/anxiety, Gout and OA of the knee bilaterally s/p TKR bilaterally who presents with worsening abdominal wall pain and redness.  She reports that her symptoms started about 8 days ago on a Friday with what she thought was a boil.  She has history of boils on her thighs and abdomen.  She presented to her PCP on Monday of last week when the boil did not get better on its own.  She was originally started on Keflex, however, her symptoms worsened and she returned to her PCP on Thursday.  She was switched to erythromycin and septra.  She was also given diflucan in case she developed a yeast infection.  However, she started the diflucan on the same day as the septra.  She waiting 12 hours to take the erythromycin b/c of possible drug-drug interaction.  However, immediately after taking this medication she became ill and vomited. She presented to [place] ED on Friday as things were getting worse.  Additional symptoms include spreading of redness on her abdomen, pain, drainage of pus and blood and swelling at the site.  She further notes chills and subjective fevers along with poor appetite.  She did have some URI symptoms over the weekend, but these have improved.   She has a history of a boil on her abdomen about 1 month ago, this improved with septra alone.  She reports that her last HgA1C was 7.1.   Assessment and Plan:  Purulent Cellulitis and abscess; failed outpatient therapy - She has failed outpatient therapy with Keflex and Septra with worsening course - IV Clindamycin given in the ED, switch to IV vancomycin per pharmacy - Monitor for 48 - 72 hours, if not getting better, will  broaden to include GN coverage - Does not appear to have abscess at this time, appears to have spontaneously drained and is now surrounded by induration.  She has also been on antibiotics X 8 days so I do not think a skin culture would be appropriate.  If re-accumulation of abscess, could do I&D to get a sample for wound culture - Pain medication with Oxycodone IR as needed. Breakthrough with IV morphine.  - Nausea control with promethazine, zofran.  - Wound care with bandage changes, wound care consult  Hyponatremia, mild, likely asymptomatic - She did have nausea/vomiting, but this was immediately after a dose of antibiotic, no further symptoms - Monitor - Last checked 09/2013 was WNL - ? Due to HCTZ  Diabetes type 2, on insulin - She reports last A1C around 7 - Recheck A1C - Cut long acting insulin by half as she has had decreased PO intake - Add SSI - Holding glipizide and metformin, restart at discharge - Up titrate long acting insulin as needed  Essential hypertension - Continue home meds including benazapril, hctz (see hyponatremia above), metoprolol  Depression - Due to death of son and recent death of adopted mother - Continue home zanax  GERD - Change to formulary protonix    Recurrent UTI, kidney stones - No current symptoms, monitor    Osteoarthrosis, bilateral knees - Continue home med, Soma - Oxy IR for pain as above will also cover knee pain.   Radiological Exams on Admission:  Ct Abdomen Pelvis W Contrast  05/31/2014   CLINICAL DATA:  Left abdominal abscess for 8 days, subsequent evaluation  EXAM: CT ABDOMEN AND PELVIS WITH CONTRAST  TECHNIQUE: Multidetector CT imaging of the abdomen and pelvis was performed using the standard protocol following bolus administration of intravenous contrast.  CONTRAST:  125mL OMNIPAQUE IOHEXOL 300 MG/ML  SOLN  COMPARISON:  None.  FINDINGS: Visualized lung bases are clear.  Heart size is normal.  Mild hepatic steatosis. Gallbladder  surgically absent. Mild biliary dilatation likely due to status post cholecystectomy. Pancreas shows mild fatty infiltration but is otherwise normal. Spleen is normal.  Left adrenal glands normal. 14 mm right adrenal apex nodule is stable.  Left kidney is normal. The right kidney itself appears normal, but there is mild prominence of the renal pelvis and proximal ureter with mild periureteral inflammation and mild urothelial enhancement. Caliber transition occur is at about the level of the mid ureter. Detail somewhat limited due to patient body habitus but no calculi are identified. On image number 56 there is equivocal punctate increased attenuation within the ureter and this is near the point of caliber transition.  Abdominal aorta shows no evidence of dilatation or other significant abnormality. Bladder is normal. Reproductive organs are normal. There is no ascites.  The stomach and large bowel are normal. The appendix is not seen well. Numerous loops of small bowel in the midline the inferior pelvis and into the right lower quadrant show mild wall thickening. These loops also demonstrate air-fluid levels and are minimally dilated relative to the remainder relatively decompressed small-bowel. The mesentery of these loops shows mild increased attenuation.  In the anterior left pannus inferiorly, there is focal skin thickening consistent with cellulitis, with increased attenuation over an area measuring 48 x 60 mm. There is an 18 mm ovoid area along the cranial margin of this process as well as a 23 mm ovoid area along the inferior margin of this process. These areas show relatively low attenuation of about 16 Hounsfield units.  IMPRESSION: 1. Cellulitis inferior left pannus. This extends approximately 5 cm deep to the dermis and there are small ovoid areas that could represent small abscesses. 2. Several loops of ileum show mild wall thickening with mild surrounding inflammation and enteritis is suspected. 3. The  appearance of the right renal pelvis and proximal half of the ureter may reflect mild chronic dilatation in this patient with history of obstructive nephropathy due to stone. No definite stone is seen currently. Postinflammatory mild stricture in the mid ureter is another consideration. Given the enhancement of the urothelium and mild inflammatory change, pyelonephritis is not excluded. 4. Other nonacute findings as described above   Electronically Signed   By: Skipper Cliche M.D.   On: 05/31/2014 17:29    Code Status: Full Family Communication: Pt at bedside Disposition Plan: Admit for further evaluation    Gilles Chiquito, MD [phone]  Review of Systems:  Constitutional: + for fever, chills. Negative for malaise/fatigue, diaphoresis.  HENT: + for sore throat Negative for hearing loss, ear pain, nosebleeds, congestion,  neck pain, tinnitus and ear discharge.   Eyes: Negative for blurred vision, double vision, photophobia, pain, discharge and redness.  Respiratory: Negative for cough, hemoptysis, sputum production, shortness of breath, wheezing  Cardiovascular: Negative for chest pain, palpitations, orthopnea, claudication and leg swelling.  Gastrointestinal: + for nausea, vomiting with erythromycin. Negative for abdominal pain. Negative for heartburn, constipation, blood in stool and melena.  Genitourinary: Negative for dysuria, urgency, frequency, hematuria  and flank pain.  Musculoskeletal: + for chronic knee pain Negative for myalgias, back pain, and falls.  Skin: + for wound to abdomen, drainage, redness Negative for itching and rash.  Neurological: Negative for dizziness and weakness. Negative for tingling, tremors, sensory change, speech change, focal weakness, loss of consciousness and headaches.  Endo/Heme/Allergies: Negative for environmental allergies and polydipsia. Does not bruise/bleed easily.  Psychiatric/Behavioral: Negative for suicidal ideas. The patient is not nervous/anxious.       Past Medical History  Diagnosis Date  . Hypertension   . Diabetes mellitus   . Gout   . Nephrolithiasis     recurrent  . Osteoarthritis, knee   . GERD (gastroesophageal reflux disease)   . Depression   . Rotator cuff syndrome of left shoulder   . Carpal tunnel syndrome, left   . Anxiety   . DUB (dysfunctional uterine bleeding)   . Allergic rhinitis   . External hemorrhoid   . Morbid obesity   . Recurrent UTI   . Obesity     Past Surgical History  Procedure Laterality Date  . Lithotripsy    . Replacement total knee bilateral    . Cholecystectomy    . Tubal ligation      Social History:  reports that she has never smoked. She has never used smokeless tobacco. She reports that she does not drink alcohol or use illicit drugs.  Allergies  Allergen Reactions  . Propoxyphene N-Acetaminophen Palpitations    Family History  Problem Relation Age of Onset  . Coronary artery disease    . Hypertension    . Diabetes    . Obesity    . Diabetes Mother   . Hypertension Mother     Prior to Admission medications   Medication Sig Start Date End Date Taking? Authorizing Provider  benazepril (LOTENSIN) 40 MG tablet Take 40 mg by mouth daily. 03/10/14  Yes Historical Provider, MD  erythromycin ethylsuccinate (EES) 400 MG tablet Take 400 mg by mouth 4 (four) times daily. 7 day course filled 05/29/14   Yes Historical Provider, MD  hydrochlorothiazide (HYDRODIURIL) 25 MG tablet Take 25 mg by mouth daily.   Yes Historical Provider, MD  insulin detemir (LEVEMIR) 100 UNIT/ML injection Inject 60 Units into the skin at bedtime.   Yes Historical Provider, MD  metFORMIN (GLUCOPHAGE) 1000 MG tablet Take 1,000 mg by mouth 2 (two) times daily with a meal.    Yes Historical Provider, MD  metoprolol (TOPROL-XL) 200 MG 24 hr tablet Take 200 mg by mouth daily.    Yes Historical Provider, MD  pantoprazole (PROTONIX) 40 MG tablet Take 40 mg by mouth daily.    Yes Historical Provider, MD   ALPRAZolam Duanne Moron) 1 MG tablet  10/18/11   Historical Provider, MD  carisoprodol (SOMA) 350 MG tablet Take one tablet by mouth twice a day as needed for pain and spasm 03/05/14   Dickie La, MD         colchicine (COLCRYS) 0.6 MG tablet Take 1 tablet (0.6 mg total) by mouth daily. 01/06/14   Dickie La, MD                glipiZIDE (GLUCOTROL) 5 MG tablet Take 5 mg by mouth 2 (two) times daily.      Historical Provider, MD  hydrochlorothiazide 25 MG tablet Take 1 tablet (25 mg total) by mouth daily. 06/02/10 11/28/13  Sheral Apley  oxyCODONE (ROXICODONE) 15 MG immediate release tablet  04/30/14   Historical Provider, MD  oxyCODONE-acetaminophen (PERCOCET) 10-325 MG per tablet Take 1 tablet by mouth every 6 (six) hours as needed. for pain 04/24/14   Historical Provider, MD  oxyCODONE-acetaminophen (PERCOCET/ROXICET) 5-325 MG per tablet Take 1-2 tablets by mouth every 4 (four) hours as needed for moderate pain or severe pain. 04/20/14   Mariea Clonts, MD  Polyoxyeth 40 Sorb Septaoleate LIQD by Does not apply route.    Historical Provider, MD  promethazine (PHENERGAN) 25 MG tablet Take 1 tablet (25 mg total) by mouth every 6 (six) hours as needed for nausea or vomiting. 10/08/13   Fredia Sorrow, MD  sulfamethoxazole-trimethoprim (BACTRIM DS,SEPTRA DS) 800-160 MG per tablet Take 1 tablet by mouth 2 (two) times daily. 10 day course filled and started 05/29/14 02/21/14   Historical Provider, MD  tamsulosin (FLOMAX) 0.4 MG CAPS capsule  04/24/14   Historical Provider, MD    Physical Exam: Filed Vitals:   05/31/14 1550 05/31/14 1645 05/31/14 1747 05/31/14 1748  BP: 133/81 128/71 121/70   Pulse: 94 105 108   Temp: 99 F (37.2 C) 98.8 F (37.1 C) 98.9 F (37.2 C)   TempSrc: Oral Oral Oral   Resp: 18 16    Height:    5\' 8"  (1.727 m)  Weight:    353 lb (160.12 kg)  SpO2: 93% 95% 95%     Physical Exam  Constitutional: Appears well-developed and  well-nourished. No distress. Obese HENT: Normocephalic.  Oropharynx is clear and moist.  Eyes: Conjunctivae and EOM are normal.  no scleral icterus.  CVS: RR, NR, S1/S2 +, no murmurs, no gallops Pulmonary: Effort and breath sounds normal, no stridor, rhonchi, wheezes, rales.  Abdominal: Soft. BS +,  no distension, tenderness, rebound or guarding.  Musculoskeletal: No edema and no tenderness.  well healed midline scars to both knees Neuro: Alert. Normal  muscle tone. No cranial nerve deficit. Skin: There is a band like area of erythema on left lower abdomen/pannus which stretches past umbilicus.  There is a softball sized area of induration on the left lower pannus with a quarter sized open wound draining serosanguinous fluid.  Tender to palpation. No expressible pus at this time.  Bandage overlying has serosanguinous fluid on it.  Otherwise, no rash or other wound.   Psychiatric: Normal mood and affect. Behavior, judgment, thought content normal.   Labs on Admission:  Basic Metabolic Panel:  Recent Labs Lab 05/31/14 1455  NA 132*  K 4.1  CL 98  CO2 25  GLUCOSE 343*  BUN 14  CREATININE 1.08  CALCIUM 8.5   Liver Function Tests:  Recent Labs Lab 05/31/14 1455  AST 14  ALT 10  ALKPHOS 101  BILITOT 0.3  PROT 8.1  ALBUMIN 3.2*    Recent Labs Lab 05/31/14 1455  LIPASE 24   CBC:  Recent Labs Lab 05/31/14 1455  WBC 8.4  NEUTROABS 5.6  HGB 12.2  HCT 38.2  MCV 85.1  PLT 443*   CBG:  Recent Labs Lab 05/31/14 1753  GLUCAP 279*    If 7PM-7AM, please contact night-coverage [email or web address] Password [facility] 05/31/2014, 7:50 PM

## 2014-05-31 NOTE — ED Notes (Signed)
Abscess on abdomen x 8 days.  Pt was on antibiotic without any improvement and she was placed on new antibiotic on Thursday (erythromycin) which has made her feel sick.  Nausea and vomiting

## 2014-05-31 NOTE — ED Provider Notes (Signed)
CSN: [id number]     Arrival date & time 05/31/14  1340 History   First MD Initiated Contact with Patient 05/31/14 1411     Chief Complaint  Patient presents with  . Abscess     (Consider location/radiation/quality/duration/timing/severity/associated sxs/prior Treatment) HPI Comments: Maria [name] is a 54 y.o. female with a PMHx of HTN, HLD, DM2, gout, nephrolithiasis, GERD, DUB, hemorrhoids, obesity, recurrent UTIs, CHF, osteoarthritis, anxiety, and depression, who presents to the ED with complaints of access to her left lower abdominal wall on her pannus 8 days. She was seen by her primary care doctor and was given Keflex, then on Thursday and she was not improving with a changed her to erythromycin and Septra. She reports that the redness is spreading, it has become more swollen, there has been purulent and sanguinous drainage from a scab located in the center, and worsened pain. She reports nausea and vomiting 1 episode last night after taking erythromycin, as well as associated chills. She does endorse slight abdominal discomfort with nausea, although this is not present at this time. She states the pain is 10/10 throbbing constant nonradiating located in the area of the abscess, worse with movement of the skin, and improved with oxycodone 15 mg tablets. She has a history of abscesses in the past, but is not sure if they were ever cultured. She denies any known fevers, red streaking, diarrhea, constipation, dysuria, hematuria, vaginal bleeding or discharge, numbness, tingling, weakness, chest pain, or shortness of breath. Primary care Drs. [name]  Patient is a 54 y.o. female presenting with abscess. The history is provided by the patient. No language interpreter was used.  Abscess Location:  Torso Torso abscess location:  Abd LLQ Size:  Softball Abscess quality: draining, induration, painful, redness, warmth and weeping   Abscess quality: no fluctuance   Red streaking: no     Duration:  8 days Progression:  Worsening Pain details:    Quality:  Throbbing   Severity:  Severe   Duration:  8 days   Timing:  Constant   Progression:  Worsening Chronicity:  Recurrent Context: diabetes   Relieved by: oxycodone 15mg  tabs. Exacerbated by: movement of skin. Ineffective treatments:  Oral antibiotics Associated symptoms: nausea and vomiting (1 time last night after erythromycin)   Associated symptoms: no fever   Risk factors: prior abscess     Past Medical History  Diagnosis Date  . Hypertension   . Hyperlipidemia   . Diabetes mellitus   . Gout   . Nephrolithiasis     recurrent  . Osteoarthritis, knee   . GERD (gastroesophageal reflux disease)   . Depression   . Rotator cuff syndrome of left shoulder   . Carpal tunnel syndrome, left   . Anxiety   . DUB (dysfunctional uterine bleeding)   . Allergic rhinitis   . External hemorrhoid   . Morbid obesity   . Recurrent UTI   . CHF (congestive heart failure)   . Obesity    Past Surgical History  Procedure Laterality Date  . Lithotripsy    . Replacement total knee bilateral    . Cholecystectomy    . Tubal ligation     Family History  Problem Relation Age of Onset  . Coronary artery disease    . Hypertension    . Diabetes    . Obesity    . Diabetes Mother   . Hypertension Mother    History  Substance Use Topics  . Smoking status: Never Smoker   .  Smokeless tobacco: Never Used  . Alcohol Use: No   OB History    Gravida Para Term Preterm AB TAB SAB Ectopic Multiple Living   4         3      Obstetric Comments   Patient had two twin pregnancies.  One Pregnancy resulted in miscarriage completely and second twin pregnancy resulted in one live birth.  She also had a son that was killed in the [employer].     Review of Systems  Constitutional: Positive for chills. Negative for fever.  Respiratory: Negative for shortness of breath.   Cardiovascular: Negative for chest pain.  Gastrointestinal:  Positive for nausea, vomiting (1 time last night after erythromycin) and abdominal pain (on pannus). Negative for diarrhea, constipation and blood in stool.  Genitourinary: Negative for dysuria, hematuria, vaginal bleeding and vaginal discharge.  Musculoskeletal: Negative for myalgias and arthralgias.  Skin: Positive for color change (abscess and cellulitis to pannus) and wound.  Allergic/Immunologic: Positive for immunocompromised state (diabetic).  Neurological: Negative for weakness and numbness.   10 Systems reviewed and are negative for acute change except as noted in the HPI.    Allergies  Propoxyphene n-acetaminophen  Home Medications   Prior to Admission medications   Medication Sig Start Date End Date Taking? Authorizing Provider  erythromycin ethylsuccinate (EES) 400 MG tablet Take 400 mg by mouth 4 (four) times daily.   Yes Historical Provider, MD  Polyoxyeth [place] by Does not apply route.   Yes Historical Provider, MD  ALPRAZolam Duanne Moron) 1 MG tablet  10/18/11   Historical Provider, MD  benazepril-hydrochlorthiazide (LOTENSIN HCT) 20-25 MG per tablet Take by mouth.    Historical Provider, MD  carisoprodol (SOMA) 350 MG tablet Take one tablet by mouth twice a day as needed for pain and spasm 03/05/14   Dickie La, MD  cephALEXin (KEFLEX) 500 MG capsule Take 1 capsule (500 mg total) by mouth 3 (three) times daily. 01/07/14   Emily Filbert, MD  colchicine (COLCRYS) 0.6 MG tablet Take 1 tablet (0.6 mg total) by mouth daily. 01/06/14   Dickie La, MD  fluconazole (DIFLUCAN) 150 MG tablet TAKE 1 TABLET (150 MG TOTAL) BY MOUTH ONCE. REPEAT IN TWO TO THREE DAYS IF SYMPTOMS PERSIST. 02/14/14   Emily Filbert, MD  fluconazole (DIFLUCAN) 200 MG tablet Take 1 tablet (200 mg total) by mouth daily. Repeat in three days if symptoms persist. 02/14/14   Emily Filbert, MD  glipiZIDE (GLUCOTROL) 5 MG tablet Take 5 mg by mouth 2 (two) times daily.      Historical Provider, MD    hydrochlorothiazide 25 MG tablet Take 1 tablet (25 mg total) by mouth daily. 06/02/10 11/28/13  Nneka Isamah  insulin glargine (LANTUS) 100 UNIT/ML injection Inject 60 units under the skin at bedtime 07/15/10   Vertell Limber, MD  Insulin Pen Needle (B-D ULTRAFINE III SHORT PEN) 31G X 8 MM MISC Use as directed once daily with Lantus solostar pen 06/02/10   Nneka Isamah  metFORMIN (GLUCOPHAGE) 1000 MG tablet Take 1,000 mg by mouth.    Historical Provider, MD  metoprolol (LOPRESSOR) 100 MG tablet  09/08/11   Historical Provider, MD  metoprolol (TOPROL-XL) 200 MG 24 hr tablet Take 200 mg by mouth.    Historical Provider, MD  metroNIDAZOLE (FLAGYL) 500 MG tablet Take 1 tablet (500 mg total) by mouth 2 (two) times daily. 01/16/14   Emily Filbert, MD  metroNIDAZOLE (FLAGYL) 500 MG tablet  Take 1 tablet (500 mg total) by mouth 2 (two) times daily. 04/18/14   Emily Filbert, MD  omeprazole (PRILOSEC) 20 MG capsule Take 1 capsule (20 mg total) by mouth 2 (two) times daily. 05/12/13   [name], MD  ondansetron (ZOFRAN ODT) 4 MG disintegrating tablet 4mg  ODT q4 hours prn nausea/vomit 04/20/14   Mariea Clonts, MD  oxyCODONE-acetaminophen (PERCOCET/ROXICET) 5-325 MG per tablet Take 1-2 tablets by mouth every 4 (four) hours as needed for moderate pain or severe pain. 04/20/14   Mariea Clonts, MD  pantoprazole (PROTONIX) 40 MG tablet Take 40 mg by mouth.    Historical Provider, MD  promethazine (PHENERGAN) 25 MG tablet Take 1 tablet (25 mg total) by mouth every 6 (six) hours as needed for nausea or vomiting. 10/08/13   Fredia Sorrow, MD   BP 133/71 mmHg  Pulse 103  Temp(Src) 98.7 F (37.1 C) (Oral)  Resp 20  Ht 5\' 8"  (1.727 m)  Wt 295 lb (133.811 kg)  BMI 44.86 kg/m2  SpO2 96%  LMP 04/11/2009 Physical Exam  Constitutional: She is oriented to person, place, and time. She appears well-developed.  Non-toxic appearance. No distress.  Morbidly obese female with mild tachycardia at 103, afebrile and nontoxic  HENT:   Head: Normocephalic and atraumatic.  Mouth/Throat: Oropharynx is clear and moist. Mucous membranes are dry.  Mildly dry mucous membranes  Eyes: Conjunctivae and EOM are normal. Right eye exhibits no discharge. Left eye exhibits no discharge.  Neck: Normal range of motion. Neck supple.  Cardiovascular: Regular rhythm, normal heart sounds and intact distal pulses.  Tachycardia present.  Exam reveals no gallop and no friction rub.   No murmur heard. Slightly tachycardic at 103, reg rhythm, nl s1/s2, no m/r/g  Pulmonary/Chest: Effort normal and breath sounds normal. No respiratory distress. She has no decreased breath sounds. She has no wheezes. She has no rhonchi. She has no rales.  Abdominal: Soft. Normal appearance and bowel sounds are normal. She exhibits no distension. There is no tenderness. There is no rigidity, no rebound, no guarding, no tenderness at McBurney's point and negative Murphy's sign.  Soft, very obese abdomen which limits exam, but NTND, +BS throughout, no r/g/r, neg murphy's, neg mcburney's  Pannus abscess noted below  Musculoskeletal: Normal range of motion.  Neurological: She is alert and oriented to person, place, and time. She has normal strength. No sensory deficit.  Skin: Skin is warm and dry. No rash noted. There is erythema.  Pannus with large abscess noted in L side, approx 10cm in diameter of induration without fluctuance, with some purulent and sanguinous oozing from wound in the center, with surrounding erythema and warmth extending outward from abscess (SEE PICTURE BELOW)  Psychiatric: She has a normal mood and affect.  Nursing note and vitals reviewed.     ED Course  Procedures (including critical care time) Labs Review Labs Reviewed  CBC WITH DIFFERENTIAL/PLATELET - Abnormal; Notable for the following:    Platelets 443 (*)    All other components within normal limits  COMPREHENSIVE METABOLIC PANEL - Abnormal; Notable for the following:    Sodium 132  (*)    Glucose, Bld 343 (*)    Albumin 3.2 (*)    GFR calc non Af Amer 57 (*)    GFR calc Af Amer 66 (*)    All other components within normal limits  GLUCOSE, CAPILLARY - Abnormal; Notable for the following:    Glucose-Capillary 279 (*)    All other  components within normal limits  CULTURE, BLOOD (ROUTINE X 2)  CULTURE, BLOOD (ROUTINE X 2)  LIPASE, BLOOD  URINALYSIS, ROUTINE W REFLEX MICROSCOPIC  HIV ANTIBODY (ROUTINE TESTING)  CBC  CREATININE, SERUM  CBC  COMPREHENSIVE METABOLIC PANEL    Imaging Review Ct Abdomen Pelvis W Contrast  05/31/2014   CLINICAL DATA:  Left abdominal abscess for 8 days, subsequent evaluation  EXAM: CT ABDOMEN AND PELVIS WITH CONTRAST  TECHNIQUE: Multidetector CT imaging of the abdomen and pelvis was performed using the standard protocol following bolus administration of intravenous contrast.  CONTRAST:  173mL OMNIPAQUE IOHEXOL 300 MG/ML  SOLN  COMPARISON:  None.  FINDINGS: Visualized lung bases are clear.  Heart size is normal.  Mild hepatic steatosis. Gallbladder surgically absent. Mild biliary dilatation likely due to status post cholecystectomy. Pancreas shows mild fatty infiltration but is otherwise normal. Spleen is normal.  Left adrenal glands normal. 14 mm right adrenal apex nodule is stable.  Left kidney is normal. The right kidney itself appears normal, but there is mild prominence of the renal pelvis and proximal ureter with mild periureteral inflammation and mild urothelial enhancement. Caliber transition occur is at about the level of the mid ureter. Detail somewhat limited due to patient body habitus but no calculi are identified. On image number 56 there is equivocal punctate increased attenuation within the ureter and this is near the point of caliber transition.  Abdominal aorta shows no evidence of dilatation or other significant abnormality. Bladder is normal. Reproductive organs are normal. There is no ascites.  The stomach and large bowel are  normal. The appendix is not seen well. Numerous loops of small bowel in the midline the inferior pelvis and into the right lower quadrant show mild wall thickening. These loops also demonstrate air-fluid levels and are minimally dilated relative to the remainder relatively decompressed small-bowel. The mesentery of these loops shows mild increased attenuation.  In the anterior left pannus inferiorly, there is focal skin thickening consistent with cellulitis, with increased attenuation over an area measuring 48 x 60 mm. There is an 18 mm ovoid area along the cranial margin of this process as well as a 23 mm ovoid area along the inferior margin of this process. These areas show relatively low attenuation of about 16 Hounsfield units.  IMPRESSION: 1. Cellulitis inferior left pannus. This extends approximately 5 cm deep to the dermis and there are small ovoid areas that could represent small abscesses. 2. Several loops of ileum show mild wall thickening with mild surrounding inflammation and enteritis is suspected. 3. 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 No definite stone is seen currently. Postinflammatory mild stricture in the mid ureter is another consideration. Given the enhancement of the urothelium and mild inflammatory change, pyelonephritis is not excluded. 4. Other nonacute findings as described above   Electronically Signed   By: Skipper Cliche M.D.   On: 05/31/2014 17:29     EKG Interpretation None      MDM   Final diagnoses:  Cellulitis and abscess    55 y.o. female with abscess that has failed outpatient treatment twice. Now having some nausea/vomiting with antibiotics. At this time, since this has worsened on PO abx, will transfer for admission for IV abx. Will start clindamycin here.   3:56 PM Hospitalist for triad returning page, would like CT  imaging done here prior to transfer.  Will admit to tele at [facility]. Transfer orders placed. Pt comfortable with improved VSS. Will monitor for changes. CBC w/diff showing plt 443 but otherwise WNL. CMP with gluc 343 without AG or abnormal bicarb. Lipase WNL. U/A still not resulted at this time.  BP 121/70 mmHg  Pulse 108  Temp(Src) 98.9 F (37.2 C) (Oral)  Resp 16  Ht 5\' 8"  (1.727 m)  Wt 353 lb (160.12 kg)  BMI 53.69 kg/m2  SpO2 95%  LMP 04/11/2009  Meds ordered this encounter  Medications  . 0.9 %  sodium chloride infusion    Sig:   . ondansetron (ZOFRAN) injection 4 mg    Sig:   . morphine 4 MG/ML injection 4 mg    Sig:   . clindamycin (CLEOCIN) IVPB 600 mg    Sig:     Order Specific Question:  Indications    Answer:  Cellulitis  . iohexol (OMNIPAQUE) 300 MG/ML solution 100 mL    Sig:   . morphine 4 MG/ML injection 4 mg    Sig:      Patty Sermons [place], PA-C 05/31/14 2111  Carmin Muskrat, MD 06/01/14 [phone]

## 2014-05-31 NOTE — Progress Notes (Signed)
Pt. Arrived to floor from [facility] via [place].  Pt. Is alert & oriented.  Pt. Oriented to room.  Pt. Has large abdominal abcess with draining head on left lower abdomen covered ABD pad.  Pt. Denies pain at this time.

## 2014-05-31 NOTE — Progress Notes (Signed)
ANTIBIOTIC CONSULT NOTE - INITIAL  Pharmacy Consult for vancomycin Indication: cellulitis  Allergies  Allergen Reactions  . Propoxyphene N-Acetaminophen Palpitations    Patient Measurements: Height: 5\' 8"  (172.7 cm) Weight: (!) 353 lb (160.12 kg) IBW/kg (Calculated) : 63.9   Vital Signs: Temp: 98.9 F (37.2 C) (02/20 1747) Temp Source: Oral (02/20 1747) BP: 121/70 mmHg (02/20 1747) Pulse Rate: 108 (02/20 1747) Intake/Output from previous day:   Intake/Output from this shift:    Labs:  Recent Labs  05/31/14 1455  WBC 8.4  HGB 12.2  PLT 443*  CREATININE 1.08   Estimated Creatinine Clearance: 95.1 mL/min (by C-G formula based on Cr of 1.08). No results for input(s): VANCOTROUGH, VANCOPEAK, VANCORANDOM, GENTTROUGH, GENTPEAK, GENTRANDOM, TOBRATROUGH, TOBRAPEAK, TOBRARND, AMIKACINPEAK, AMIKACINTROU, AMIKACIN in the last 72 hours.   Microbiology: No results found for this or any previous visit (from the past 720 hour(s)).  Medical History: Past Medical History  Diagnosis Date  . Hypertension   . Hyperlipidemia   . Diabetes mellitus   . Gout   . Nephrolithiasis     recurrent  . Osteoarthritis, knee   . GERD (gastroesophageal reflux disease)   . Depression   . Rotator cuff syndrome of left shoulder   . Carpal tunnel syndrome, left   . Anxiety   . DUB (dysfunctional uterine bleeding)   . Allergic rhinitis   . External hemorrhoid   . Morbid obesity   . Recurrent UTI   . CHF (congestive heart failure)   . Obesity     Medications:  Prescriptions prior to admission  Medication Sig Dispense Refill Last Dose  . ALPRAZolam (XANAX) 1 MG tablet Take 1 mg by mouth 3 (three) times daily as needed for anxiety.    05/29/2014  . benazepril (LOTENSIN) 40 MG tablet Take 40 mg by mouth daily.  2 05/29/2014  . carisoprodol (SOMA) 350 MG tablet Take one tablet by mouth twice a day as needed for pain and spasm (Patient taking differently: Take 350 mg by mouth 2 (two) times  daily as needed for muscle spasms (pain). ) 60 tablet 2 week ago  . colchicine (COLCRYS) 0.6 MG tablet Take 1 tablet (0.6 mg total) by mouth daily. 30 tablet 12 05/29/2014  . erythromycin ethylsuccinate (EES) 400 MG tablet Take 400 mg by mouth 4 (four) times daily. 7 day course filled 05/29/14   05/30/2014 at pm  . glipiZIDE (GLUCOTROL) 5 MG tablet Take 5 mg by mouth 2 (two) times daily.     05/29/2014  . hydrochlorothiazide (HYDRODIURIL) 25 MG tablet Take 25 mg by mouth daily.   05/29/2014  . insulin detemir (LEVEMIR) 100 UNIT/ML injection Inject 60 Units into the skin at bedtime.   05/27/2014  . metFORMIN (GLUCOPHAGE) 1000 MG tablet Take 1,000 mg by mouth 2 (two) times daily with a meal.    05/29/2014  . metoprolol (TOPROL-XL) 200 MG 24 hr tablet Take 200 mg by mouth daily.    05/29/2014 at betw lunch & supper  . oxyCODONE (ROXICODONE) 15 MG immediate release tablet Take 15 mg by mouth every 6 (six) hours as needed for pain.   0 05/30/2014 at Unknown time  . pantoprazole (PROTONIX) 40 MG tablet Take 40 mg by mouth daily.    05/29/2014  . promethazine (PHENERGAN) 25 MG tablet Take 1 tablet (25 mg total) by mouth every 6 (six) hours as needed for nausea or vomiting. 12 tablet 1 month ago  . sulfamethoxazole-trimethoprim (BACTRIM DS,SEPTRA DS) 800-160 MG per tablet Take  1 tablet by mouth 2 (two) times daily. 10 day course filled and started 05/29/14  0 05/30/2014 at Unknown time  . cephALEXin (KEFLEX) 500 MG capsule Take 1 capsule (500 mg total) by mouth 3 (three) times daily. (Patient not taking: Reported on 05/31/2014) 21 capsule 0 Not Taking at Unknown time  . fluconazole (DIFLUCAN) 150 MG tablet TAKE 1 TABLET (150 MG TOTAL) BY MOUTH ONCE. REPEAT IN TWO TO THREE DAYS IF SYMPTOMS PERSIST. (Patient not taking: Reported on 05/31/2014) 2 tablet 0 Not Taking at Unknown time  . fluconazole (DIFLUCAN) 200 MG tablet Take 1 tablet (200 mg total) by mouth daily. Repeat in three days if symptoms persist. (Patient not taking:  Reported on 05/31/2014) 2 tablet 1 Not Taking at Unknown time  . hydrochlorothiazide 25 MG tablet Take 1 tablet (25 mg total) by mouth daily. 30 tablet 11 Taking  . insulin glargine (LANTUS) 100 UNIT/ML injection Inject 60 units under the skin at bedtime (Patient not taking: Reported on 05/31/2014) 10 mL 12 Not Taking at Unknown time  . Insulin Pen Needle (B-D ULTRAFINE III SHORT PEN) 31G X 8 MM MISC Use as directed once daily with Lantus solostar pen 1 each 11 Taking  . metroNIDAZOLE (FLAGYL) 500 MG tablet Take 1 tablet (500 mg total) by mouth 2 (two) times daily. (Patient not taking: Reported on 05/31/2014) 14 tablet 0 Not Taking at Unknown time  . metroNIDAZOLE (FLAGYL) 500 MG tablet Take 1 tablet (500 mg total) by mouth 2 (two) times daily. (Patient not taking: Reported on 05/31/2014) 14 tablet 0 Not Taking at Unknown time  . omeprazole (PRILOSEC) 20 MG capsule Take 1 capsule (20 mg total) by mouth 2 (two) times daily. (Patient not taking: Reported on 05/31/2014) 60 capsule 0 Not Taking at Unknown time  . ondansetron (ZOFRAN ODT) 4 MG disintegrating tablet 4mg  ODT q4 hours prn nausea/vomit (Patient not taking: Reported on 05/31/2014) 10 tablet 0 Not Taking at Unknown time  . oxyCODONE-acetaminophen (PERCOCET/ROXICET) 5-325 MG per tablet Take 1-2 tablets by mouth every 4 (four) hours as needed for moderate pain or severe pain. (Patient not taking: Reported on 05/31/2014) 10 tablet 0 Not Taking at Unknown time   Assessment: 54 yo obese F transferred from [facility].  Pt has a large abdominal abscess with draining head on left lower abdomen x 8 days.  It worsened after outpatient oral abx with keflex/bactrim/erythromycin.  WBC 8.4, creat 1.08, wt 160 kg.  Afebrile.  clinda 600 IV x 1 2/20 vanc 2/20>>  BCx2 2/20>>   Goal of Therapy:  Vancomycin trough level 15-20 mcg/ml  Plan:  -vancomycin 2500 mg IV load, then vancomycin 1250 mg IV q12h - change LMWH to 0.5 mg/kg/dose = 80 mg q24 for VTE px  in obesity -f/u renal function, wbc, temp, culture data -steady-state vancomycin as needed  Eudelia Bunch, Pharm.D. [phone] 05/31/2014 7:05 PM

## 2014-05-31 NOTE — ED Notes (Signed)
[facility] has been notified of room [phone]

## 2014-05-31 NOTE — Progress Notes (Signed)
[facility] transfer:  Maria [name] is a 40yr female with h/o obesity/IDDM2/HTN/nephrolithiasis presented to [facility] due to worsening of abdominal wall cellulitis/abcess? Worsened after outpatient oral abx with keflex/bactrium/zithromax, she does not have fever, according to [facility], she is hemodynamically stable, no leukocytosis. She is given IVf/iv clinda in ED. No imaging on I/D done. Given uncontrolled DM2, likely will need broaden abx once here. Accepted to med /tele bed.

## 2014-06-01 ENCOUNTER — Encounter (HOSPITAL_COMMUNITY): Payer: Self-pay | Admitting: General Surgery

## 2014-06-01 ENCOUNTER — Inpatient Hospital Stay (HOSPITAL_COMMUNITY): Payer: PPO | Admitting: Anesthesiology

## 2014-06-01 ENCOUNTER — Encounter (HOSPITAL_COMMUNITY)
Admission: EM | Disposition: A | Discharge status: Home Health | Payer: Self-pay | Source: Home / Self Care | Attending: Internal Medicine

## 2014-06-01 DIAGNOSIS — E785 Hyperlipidemia, unspecified: Secondary | ICD-10-CM

## 2014-06-01 DIAGNOSIS — K210 Gastro-esophageal reflux disease with esophagitis: Secondary | ICD-10-CM

## 2014-06-01 HISTORY — PX: INCISION AND DRAINAGE ABSCESS: SHX5864

## 2014-06-01 LAB — CBC
HCT: 33.7 % — ABNORMAL LOW (ref 36.0–46.0)
Hemoglobin: 10.6 g/dL — ABNORMAL LOW (ref 12.0–15.0)
MCH: 27.5 pg (ref 26.0–34.0)
MCHC: 31.5 g/dL (ref 30.0–36.0)
MCV: 87.5 fL (ref 78.0–100.0)
Platelets: 413 10*3/uL — ABNORMAL HIGH (ref 150–400)
RBC: 3.85 MIL/uL — ABNORMAL LOW (ref 3.87–5.11)
RDW: 14 % (ref 11.5–15.5)
WBC: 6.6 10*3/uL (ref 4.0–10.5)

## 2014-06-01 LAB — URINALYSIS, ROUTINE W REFLEX MICROSCOPIC
Bilirubin Urine: NEGATIVE
Glucose, UA: 250 mg/dL — AB
Ketones, ur: 15 mg/dL — AB
Leukocytes, UA: NEGATIVE
Nitrite: NEGATIVE
Protein, ur: >300 mg/dL — AB
Specific Gravity, Urine: 1.02 (ref 1.005–1.030)
Urobilinogen, UA: 0.2 mg/dL (ref 0.0–1.0)
pH: 6.5 (ref 5.0–8.0)

## 2014-06-01 LAB — URINE MICROSCOPIC-ADD ON

## 2014-06-01 LAB — COMPREHENSIVE METABOLIC PANEL
ALT: 10 U/L (ref 0–35)
AST: 16 U/L (ref 0–37)
Albumin: 2.5 g/dL — ABNORMAL LOW (ref 3.5–5.2)
Alkaline Phosphatase: 93 U/L (ref 39–117)
Anion gap: 6 (ref 5–15)
BUN: 15 mg/dL (ref 6–23)
CO2: 31 mmol/L (ref 19–32)
Calcium: 8.6 mg/dL (ref 8.4–10.5)
Chloride: 100 mmol/L (ref 96–112)
Creatinine, Ser: 1.4 mg/dL — ABNORMAL HIGH (ref 0.50–1.10)
GFR calc Af Amer: 48 mL/min — ABNORMAL LOW (ref >90)
GFR calc non Af Amer: 41 mL/min — ABNORMAL LOW (ref >90)
Glucose, Bld: 216 mg/dL — ABNORMAL HIGH (ref 70–99)
Potassium: 3.7 mmol/L (ref 3.5–5.1)
Sodium: 137 mmol/L (ref 135–145)
Total Bilirubin: 0.4 mg/dL (ref 0.3–1.2)
Total Protein: 6.7 g/dL (ref 6.0–8.3)

## 2014-06-01 LAB — GLUCOSE, CAPILLARY
Glucose-Capillary: 195 mg/dL — ABNORMAL HIGH (ref 70–99)
Glucose-Capillary: 166 mg/dL — ABNORMAL HIGH (ref 70–99)
Glucose-Capillary: 181 mg/dL — ABNORMAL HIGH (ref 70–99)
Glucose-Capillary: 211 mg/dL — ABNORMAL HIGH (ref 70–99)
Glucose-Capillary: 249 mg/dL — ABNORMAL HIGH (ref 70–99)

## 2014-06-01 LAB — SURGICAL PCR SCREEN
MRSA, PCR: POSITIVE — AB
Staphylococcus aureus: POSITIVE — AB

## 2014-06-01 SURGERY — INCISION AND DRAINAGE, ABSCESS
Anesthesia: General

## 2014-06-01 MED ORDER — LIDOCAINE HCL (CARDIAC) 20 MG/ML IV SOLN
INTRAVENOUS | Status: AC
  Filled 2014-06-01: qty 15

## 2014-06-01 MED ORDER — CHLORHEXIDINE GLUCONATE CLOTH 2 % EX PADS
6.0000 | MEDICATED_PAD | Freq: Every day | CUTANEOUS | Status: DC
  Administered 2014-06-02: 6 via TOPICAL

## 2014-06-01 MED ORDER — INSULIN DETEMIR 100 UNIT/ML ~~LOC~~ SOLN
30.0000 [IU] | Freq: Every day | SUBCUTANEOUS | Status: DC
  Filled 2014-06-01: qty 0.3

## 2014-06-01 MED ORDER — GLYCOPYRROLATE 0.2 MG/ML IJ SOLN
INTRAMUSCULAR | Status: AC
  Filled 2014-06-01: qty 2

## 2014-06-01 MED ORDER — LIDOCAINE HCL (CARDIAC) 20 MG/ML IV SOLN: INTRAVENOUS | Status: DC | PRN

## 2014-06-01 MED ORDER — MENTHOL 3 MG MT LOZG
1.0000 | LOZENGE | OROMUCOSAL | Status: DC | PRN
Start: 2014-06-01 — End: 2014-06-02
  Administered 2014-06-01: 3 mg via ORAL
  Filled 2014-06-01: qty 9

## 2014-06-01 MED ORDER — LIDOCAINE HCL (CARDIAC) 20 MG/ML IV SOLN
INTRAVENOUS | Status: DC | PRN
  Administered 2014-06-01: 100 mg via INTRAVENOUS

## 2014-06-01 MED ORDER — SODIUM CHLORIDE 0.9 % IV SOLN
INTRAVENOUS | Status: DC
  Administered 2014-06-01: 10:00:00 via INTRAVENOUS

## 2014-06-01 MED ORDER — MORPHINE SULFATE 2 MG/ML IJ SOLN
1.0000 mg | INTRAMUSCULAR | Status: DC | PRN
  Administered 2014-06-01: 1 mg via INTRAVENOUS
  Filled 2014-06-01: qty 1

## 2014-06-01 MED ORDER — FENTANYL CITRATE 0.05 MG/ML IJ SOLN
INTRAMUSCULAR | Status: DC | PRN
  Administered 2014-06-01: 50 ug via INTRAVENOUS
  Administered 2014-06-01: 100 ug via INTRAVENOUS

## 2014-06-01 MED ORDER — ONDANSETRON HCL 4 MG/2ML IJ SOLN
INTRAMUSCULAR | Status: AC
  Filled 2014-06-01: qty 2

## 2014-06-01 MED ORDER — PROPOFOL 10 MG/ML IV BOLUS
INTRAVENOUS | Status: AC
  Filled 2014-06-01: qty 20

## 2014-06-01 MED ORDER — 0.9 % SODIUM CHLORIDE (POUR BTL) OPTIME
TOPICAL | Status: DC | PRN
  Administered 2014-06-01: 1000 mL

## 2014-06-01 MED ORDER — SUCCINYLCHOLINE CHLORIDE 20 MG/ML IJ SOLN
INTRAMUSCULAR | Status: DC | PRN
  Administered 2014-06-01: 140 mg via INTRAVENOUS

## 2014-06-01 MED ORDER — ENOXAPARIN SODIUM 80 MG/0.8ML ~~LOC~~ SOLN
0.5000 mg/kg | SUBCUTANEOUS | Status: DC
  Administered 2014-06-02: 80 mg via SUBCUTANEOUS
  Filled 2014-06-01: qty 0.8

## 2014-06-01 MED ORDER — ONDANSETRON HCL 4 MG/2ML IJ SOLN: 4.0000 mg | Freq: Once | INTRAMUSCULAR | Status: DC | PRN

## 2014-06-01 MED ORDER — SENNA 8.6 MG PO TABS
1.0000 | ORAL_TABLET | Freq: Once | ORAL | Status: AC
  Administered 2014-06-01: 8.6 mg via ORAL
  Filled 2014-06-01: qty 1

## 2014-06-01 MED ORDER — DOCUSATE SODIUM 100 MG PO CAPS
100.0000 mg | ORAL_CAPSULE | Freq: Two times a day (BID) | ORAL | Status: DC
Start: 2014-06-01 — End: 2014-06-01

## 2014-06-01 MED ORDER — INSULIN DETEMIR 100 UNIT/ML ~~LOC~~ SOLN
40.0000 [IU] | Freq: Every day | SUBCUTANEOUS | Status: DC
  Administered 2014-06-01: 40 [IU] via SUBCUTANEOUS
  Filled 2014-06-01 (×2): qty 0.4

## 2014-06-01 MED ORDER — HYDROMORPHONE HCL 1 MG/ML IJ SOLN: 0.2500 mg | INTRAMUSCULAR | Status: DC | PRN

## 2014-06-01 MED ORDER — NEOSTIGMINE METHYLSULFATE 10 MG/10ML IV SOLN
INTRAVENOUS | Status: AC
  Filled 2014-06-01: qty 1

## 2014-06-01 MED ORDER — ARTIFICIAL TEARS OP OINT
TOPICAL_OINTMENT | OPHTHALMIC | Status: AC
  Filled 2014-06-01: qty 3.5

## 2014-06-01 MED ORDER — STERILE WATER FOR INJECTION IJ SOLN
INTRAMUSCULAR | Status: AC
  Filled 2014-06-01: qty 10

## 2014-06-01 MED ORDER — ARTIFICIAL TEARS OP OINT
TOPICAL_OINTMENT | OPHTHALMIC | Status: DC | PRN
  Administered 2014-06-01: 1 via OPHTHALMIC

## 2014-06-01 MED ORDER — ROCURONIUM BROMIDE 50 MG/5ML IV SOLN
INTRAVENOUS | Status: AC
  Filled 2014-06-01: qty 1

## 2014-06-01 MED ORDER — HYDROCOD POLST-CHLORPHEN POLST 10-8 MG/5ML PO LQCR: 5.0000 mL | Freq: Every evening | ORAL | Status: DC | PRN

## 2014-06-01 MED ORDER — SUCCINYLCHOLINE CHLORIDE 20 MG/ML IJ SOLN
INTRAMUSCULAR | Status: AC
  Filled 2014-06-01: qty 1

## 2014-06-01 MED ORDER — MIDAZOLAM HCL 2 MG/2ML IJ SOLN
INTRAMUSCULAR | Status: AC
  Filled 2014-06-01: qty 2

## 2014-06-01 MED ORDER — LACTATED RINGERS IV SOLN
INTRAVENOUS | Status: DC | PRN
  Administered 2014-06-01: 11:00:00 via INTRAVENOUS

## 2014-06-01 MED ORDER — MUPIROCIN 2 % EX OINT
1.0000 "application " | TOPICAL_OINTMENT | Freq: Two times a day (BID) | CUTANEOUS | Status: DC
  Administered 2014-06-01 – 2014-06-02 (×2): 1 via NASAL
  Filled 2014-06-01: qty 22

## 2014-06-01 MED ORDER — EPHEDRINE SULFATE 50 MG/ML IJ SOLN
INTRAMUSCULAR | Status: AC
  Filled 2014-06-01: qty 1

## 2014-06-01 MED ORDER — FENTANYL CITRATE 0.05 MG/ML IJ SOLN
INTRAMUSCULAR | Status: AC
  Filled 2014-06-01: qty 5

## 2014-06-01 MED ORDER — MORPHINE SULFATE 2 MG/ML IJ SOLN
2.0000 mg | INTRAMUSCULAR | Status: DC | PRN
Start: 2014-06-01 — End: 2014-06-02
  Administered 2014-06-01 – 2014-06-02 (×4): 2 mg via INTRAVENOUS
  Filled 2014-06-01 (×4): qty 1

## 2014-06-01 MED ORDER — PROPOFOL 10 MG/ML IV BOLUS
INTRAVENOUS | Status: DC | PRN
  Administered 2014-06-01: 200 mg via INTRAVENOUS

## 2014-06-01 MED ORDER — OXYCODONE HCL 5 MG PO TABS
15.0000 mg | ORAL_TABLET | Freq: Four times a day (QID) | ORAL | Status: DC | PRN
  Administered 2014-06-02: 15 mg via ORAL
  Filled 2014-06-01 (×2): qty 3

## 2014-06-01 SURGICAL SUPPLY — 27 items
BNDG GAUZE ELAST 4 BULKY (GAUZE/BANDAGES/DRESSINGS) ×2 IMPLANT
CANISTER SUCTION 2500CC (MISCELLANEOUS) ×2 IMPLANT
COVER SURGICAL LIGHT HANDLE (MISCELLANEOUS) ×2 IMPLANT
DRAPE LAPAROSCOPIC ABDOMINAL (DRAPES) ×1 IMPLANT
DRAPE PED LAPAROTOMY (DRAPES) IMPLANT
DRAPE UTILITY XL STRL (DRAPES) ×4 IMPLANT
DRSG PAD ABDOMINAL 8X10 ST (GAUZE/BANDAGES/DRESSINGS) ×2 IMPLANT
ELECT CAUTERY BLADE 6.4 (BLADE) ×2 IMPLANT
ELECT REM PT RETURN 9FT ADLT (ELECTROSURGICAL) ×2
ELECTRODE REM PT RTRN 9FT ADLT (ELECTROSURGICAL) ×1 IMPLANT
GAUZE SPONGE 4X4 12PLY STRL (GAUZE/BANDAGES/DRESSINGS) IMPLANT
GLOVE BIO SURGEON STRL SZ7 (GLOVE) ×2 IMPLANT
GLOVE BIOGEL PI IND STRL 7.5 (GLOVE) ×1 IMPLANT
GLOVE BIOGEL PI INDICATOR 7.5 (GLOVE) ×1
GOWN STRL REUS W/ TWL LRG LVL3 (GOWN DISPOSABLE) ×2 IMPLANT
GOWN STRL REUS W/TWL LRG LVL3 (GOWN DISPOSABLE) ×4
KIT BASIN OR (CUSTOM PROCEDURE TRAY) ×2 IMPLANT
KIT ROOM TURNOVER OR (KITS) ×2 IMPLANT
NS IRRIG 1000ML POUR BTL (IV SOLUTION) ×2 IMPLANT
PACK GENERAL/GYN (CUSTOM PROCEDURE TRAY) ×2 IMPLANT
PAD ABD 8X10 STRL (GAUZE/BANDAGES/DRESSINGS) ×1 IMPLANT
PAD ARMBOARD 7.5X6 YLW CONV (MISCELLANEOUS) ×2 IMPLANT
SWAB COLLECTION DEVICE MRSA (MISCELLANEOUS) ×1 IMPLANT
TAPE CLOTH SURG 6X10 WHT LF (GAUZE/BANDAGES/DRESSINGS) ×1 IMPLANT
TOWEL OR 17X24 6PK STRL BLUE (TOWEL DISPOSABLE) ×2 IMPLANT
TOWEL OR 17X26 10 PK STRL BLUE (TOWEL DISPOSABLE) ×2 IMPLANT
TUBE ANAEROBIC SPECIMEN COL (MISCELLANEOUS) ×1 IMPLANT

## 2014-06-01 NOTE — Progress Notes (Signed)
PATIENT DETAILS Name: Maria [name] Age: 54 y.o. Sex: female Date of Birth: 09/11/1960 Admit Date: 05/31/2014 Admitting Physician Florencia Reasons, MD ZWC:[id number], Gwynneth Munson, MD  Subjective: No major events overnight  Assessment/Plan: Principal Problem:   Cellulitis and abscess of ant abd wall: ongoing for 2 weeks, failed outpatient therapy with Keflex and Septra.On exam has a indurated area around 5-6 cm in size that is likely an organized abscess that needs I&D-have consulted Gen Surgery. Keep NPO, continue IV Vancomycin  Active Problems:   AKI: continue IVF, recheck lytes in am    Diabetes type 2, uncontrolled:continue Lantus and SSI. Resume glipizide and metformin on discharge    Essential hypertension:continue benazapril, hctz and metoprolol. Follow BP and adjust accordingly    GERD:c/w PPI    Depression/Anxiety:stable,c/w Xanax  Disposition: Remain inpatient  Antibiotics:  See below   Anti-infectives    Start     Dose/Rate Route Frequency Ordered Stop   06/01/14 0800  vancomycin (VANCOCIN) 1,250 mg in sodium chloride 0.9 % 250 mL IVPB     1,250 mg 166.7 mL/hr over 90 Minutes Intravenous Every 12 hours 05/31/14 1857     05/31/14 2000  vancomycin (VANCOCIN) 2,500 mg in sodium chloride 0.9 % 500 mL IVPB     2,500 mg 250 mL/hr over 120 Minutes Intravenous  Once 05/31/14 1854 05/31/14 2215   05/31/14 1900  vancomycin (VANCOCIN) IVPB 1000 mg/200 mL premix  Status:  Discontinued     1,000 mg 200 mL/hr over 60 Minutes Intravenous  Once 05/31/14 1847 05/31/14 1854   05/31/14 1445  clindamycin (CLEOCIN) IVPB 600 mg     600 mg 100 mL/hr over 30 Minutes Intravenous  Once 05/31/14 1442 05/31/14 1535      DVT Prophylaxis: Prophylactic Lovenox   Code Status: Full Code  Family Communication None at bedside  Procedures:  None  CONSULTS:  nephrology  Time spent 40 minutes-which includes 50% of the time with face-to-face with patient/ family and coordinating care  related to the above assessment and plan.  MEDICATIONS: Scheduled Meds: . benazepril  40 mg Oral Daily  . colchicine  0.6 mg Oral Daily  . docusate sodium  100 mg Oral BID  . enoxaparin (LOVENOX) injection  0.5 mg/kg Subcutaneous Q24H  . hydrochlorothiazide  25 mg Oral Daily  . insulin aspart  0-15 Units Subcutaneous TID WC  . insulin aspart  0-5 Units Subcutaneous QHS  . insulin detemir  30 Units Subcutaneous QHS  . metoprolol succinate  200 mg Oral Daily  . pantoprazole  40 mg Oral Daily  . vancomycin  1,250 mg Intravenous Q12H   Continuous Infusions:  PRN Meds:.ALPRAZolam, carisoprodol, morphine injection, ondansetron **OR** ondansetron (ZOFRAN) IV, oxyCODONE, promethazine    PHYSICAL EXAM: Vital signs in last 24 hours: Filed Vitals:   06/01/14 0422 06/01/14 0425 06/01/14 0545 06/01/14 0558  BP: 99/67 102/60 92/58 110/66  Pulse:      Temp:      TempSrc:      Resp:      Height:      Weight:      SpO2:        Weight change:  Filed Weights   05/31/14 1345 05/31/14 1748  Weight: 133.811 kg (295 lb) 160.12 kg (353 lb)   Body mass index is 53.69 kg/(m^2).   Gen Exam: Awake and alert with clear speech.   Neck: Supple, No JVD.   Chest: B/L Clear.   CVS: S1 S2 Regular,  no murmurs.  Abdomen: soft, BS +, non tender, non distended.Indurated area in the lower abd wall-with some drainage from pus point Extremities: no edema, lower extremities warm to touch. Neurologic: Non Focal.  Skin: No Rash.   Wounds: N/A.    Intake/Output from previous day:  Intake/Output Summary (Last 24 hours) at 06/01/14 0852 Last data filed at 06/01/14 0554  Gross per 24 hour  Intake    480 ml  Output    450 ml  Net     30 ml     LAB RESULTS: CBC  Recent Labs Lab 05/31/14 1455 05/31/14 2048 06/01/14 0600  WBC 8.4 7.8 6.6  HGB 12.2 10.9* 10.6*  HCT 38.2 33.7* 33.7*  PLT 443* 397 413*  MCV 85.1 84.9 87.5  MCH 27.2 27.5 27.5  MCHC 31.9 32.3 31.5  RDW 13.7 13.7 14.0  LYMPHSABS  2.0  --   --   MONOABS 0.8  --   --   EOSABS 0.1  --   --   BASOSABS 0.0  --   --     Chemistries   Recent Labs Lab 05/31/14 1455 05/31/14 2048 06/01/14 0600  NA 132*  --  137  K 4.1  --  3.7  CL 98  --  100  CO2 25  --  31  GLUCOSE 343*  --  216*  BUN 14  --  15  CREATININE 1.08 1.02 1.40*  CALCIUM 8.5  --  8.6    CBG:  Recent Labs Lab 05/31/14 1753 05/31/14 2221 06/01/14 0801  GLUCAP 279* 269* 195*    GFR Estimated Creatinine Clearance: 73.4 mL/min (by C-G formula based on Cr of 1.4).  Coagulation profile No results for input(s): INR, PROTIME in the last 168 hours.  Cardiac Enzymes No results for input(s): CKMB, TROPONINI, MYOGLOBIN in the last 168 hours.  Invalid input(s): CK  Invalid input(s): POCBNP No results for input(s): DDIMER in the last 72 hours. No results for input(s): HGBA1C in the last 72 hours. No results for input(s): CHOL, HDL, LDLCALC, TRIG, CHOLHDL, LDLDIRECT in the last 72 hours. No results for input(s): TSH, T4TOTAL, T3FREE, THYROIDAB in the last 72 hours.  Invalid input(s): FREET3 No results for input(s): VITAMINB12, FOLATE, FERRITIN, TIBC, IRON, RETICCTPCT in the last 72 hours.  Recent Labs  05/31/14 1455  LIPASE 24    Urine Studies No results for input(s): UHGB, CRYS in the last 72 hours.  Invalid input(s): UACOL, UAPR, USPG, UPH, UTP, UGL, UKET, UBIL, UNIT, UROB, ULEU, UEPI, UWBC, URBC, UBAC, CAST, UCOM, BILUA  MICROBIOLOGY: No results found for this or any previous visit (from the past 240 hour(s)).  RADIOLOGY STUDIES/RESULTS: Ct Abdomen Pelvis W Contrast  05/31/2014   CLINICAL DATA:  Left abdominal abscess for 8 days, subsequent evaluation  EXAM: CT ABDOMEN AND PELVIS WITH CONTRAST  TECHNIQUE: Multidetector CT imaging of the abdomen and pelvis was performed using the standard protocol following bolus administration of intravenous contrast.  CONTRAST:  111mL OMNIPAQUE IOHEXOL 300 MG/ML  SOLN  COMPARISON:  None.  FINDINGS:  Visualized lung bases are clear.  Heart size is normal.  Mild hepatic steatosis. Gallbladder surgically absent. Mild biliary dilatation likely due to status post cholecystectomy. Pancreas shows mild fatty infiltration but is otherwise normal. Spleen is normal.  Left adrenal glands normal. 14 mm right adrenal apex nodule is stable.  Left kidney is normal. The right kidney itself appears normal, but there is mild prominence of the renal pelvis and proximal ureter with  mild periureteral inflammation and mild urothelial enhancement. Caliber transition occur is at about the level of the mid ureter. Detail somewhat limited due to patient body habitus but no calculi are identified. On image number 56 there is equivocal punctate increased attenuation within the ureter and this is near the point of caliber transition.  Abdominal aorta shows no evidence of dilatation or other significant abnormality. Bladder is normal. Reproductive organs are normal. There is no ascites.  The stomach and large bowel are normal. The appendix is not seen well. Numerous loops of small bowel in the midline the inferior pelvis and into the right lower quadrant show mild wall thickening. These loops also demonstrate air-fluid levels and are minimally dilated relative to the remainder relatively decompressed small-bowel. The mesentery of these loops shows mild increased attenuation.  In the anterior left pannus inferiorly, there is focal skin thickening consistent with cellulitis, with increased attenuation over an area measuring 48 x 60 mm. There is an 18 mm ovoid area along the cranial margin of this process as well as a 23 mm ovoid area along the inferior margin of this process. These areas show relatively low attenuation of about 16 Hounsfield units.  IMPRESSION: 1. Cellulitis inferior left pannus. This extends approximately 5 cm deep to the dermis and there are small ovoid areas that could represent small abscesses. 2. Several loops of ileum  show mild wall thickening with mild surrounding inflammation and enteritis is suspected. 3. 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 No definite stone is seen currently. Postinflammatory mild stricture in the mid ureter is another consideration. Given the enhancement of the urothelium and mild inflammatory change, pyelonephritis is not excluded. 4. Other nonacute findings as described above   Electronically Signed   By: Skipper Cliche M.D.   On: 05/31/2014 17:29    Oren Binet, MD  [facility] Pager:[phone]  If 7PM-7AM, please contact night-coverage [email or web address] Password [email or web address] 06/01/2014, 8:52 AM   LOS: 1 day

## 2014-06-01 NOTE — Anesthesia Postprocedure Evaluation (Signed)
  Anesthesia Post-op Note  Patient: Maria [name]  Procedure(s) Performed: Procedure(s): INCISION AND DRAINAGE ABSCESS (N/A)  Patient Location: PACU  Anesthesia Type:General  Level of Consciousness: awake, alert , oriented and patient cooperative  Airway and Oxygen Therapy: Patient Spontanous Breathing  Post-op Pain: mild  Post-op Assessment: Post-op Vital signs reviewed, Patient's Cardiovascular Status Stable, Respiratory Function Stable, Patent Airway, No signs of Nausea or vomiting and Pain level controlled  Post-op Vital Signs: stable  Last Vitals:  Filed Vitals:   06/01/14 1524  BP: 101/68  Pulse: 81  Temp:   Resp:     Complications: No apparent anesthesia complications

## 2014-06-01 NOTE — Transfer of Care (Signed)
Immediate Anesthesia Transfer of Care Note  Patient: Maria [name]  Procedure(s) Performed: Procedure(s): INCISION AND DRAINAGE ABSCESS (N/A)  Patient Location: PACU  Anesthesia Type:General  Level of Consciousness: awake, alert , oriented and sedated  Airway & Oxygen Therapy: Patient Spontanous Breathing and Patient connected to face mask oxygen  Post-op Assessment: Report given to RN, Post -op Vital signs reviewed and stable and Patient moving all extremities  Post vital signs: Reviewed and stable  Last Vitals:  Filed Vitals:   06/01/14 1142  BP: 112/44  Pulse: 88  Temp:   Resp: 18    Complications: No apparent anesthesia complications

## 2014-06-01 NOTE — Consult Note (Signed)
Maria [name] Jul 27, 1960  [id number].   Primary Care MD: PA in [place] Requesting MD: Dr. Oren Binet Chief Complaint/Reason for Consult: abdominal wall abscess HPI: This is a 54 yo black female who is morbidly obese, HTN, DM, who developed a "boil" on her abdominal wall last Friday.  It has since progressed despite outpatient abx therapy with Keflex, Septra, and Erythromycin.  She has had subjective fevers at home along with some chills.  She finally presented to the [facility] where she was admitted for abx therapy for this large abdominal wall abscess.  We were consulted for further evaluation.  ROS : Please see HPI, otherwise negative  Family History  Problem Relation Age of Onset  . Coronary artery disease    . Hypertension    . Diabetes    . Obesity    . Diabetes Mother   . Hypertension Mother     Past Medical History  Diagnosis Date  . Hypertension   . Diabetes mellitus   . Gout   . Nephrolithiasis     recurrent  . Osteoarthritis, knee   . GERD (gastroesophageal reflux disease)   . Depression   . Rotator cuff syndrome of left shoulder   . Carpal tunnel syndrome, left   . Anxiety   . DUB (dysfunctional uterine bleeding)   . Allergic rhinitis   . External hemorrhoid   . Morbid obesity   . Recurrent UTI   . Obesity     Past Surgical History  Procedure Laterality Date  . Lithotripsy    . Replacement total knee bilateral    . Cholecystectomy    . Tubal ligation      Social History:  reports that she has never smoked. She has never used smokeless tobacco. She reports that she does not drink alcohol or use illicit drugs.  Allergies:  Allergies  Allergen Reactions  . Propoxyphene N-Acetaminophen Palpitations    Medications Prior to Admission  Medication Sig Dispense Refill  . ALPRAZolam (XANAX) 1 MG tablet Take 1 mg by mouth 3 (three) times daily as needed for anxiety.     . benazepril (LOTENSIN) 40 MG tablet Take 40 mg by mouth daily.  2  . carisoprodol  (SOMA) 350 MG tablet Take one tablet by mouth twice a day as needed for pain and spasm (Patient taking differently: Take 350 mg by mouth 2 (two) times daily as needed for muscle spasms (pain). ) 60 tablet 2  . colchicine (COLCRYS) 0.6 MG tablet Take 1 tablet (0.6 mg total) by mouth daily. 30 tablet 12  . erythromycin ethylsuccinate (EES) 400 MG tablet Take 400 mg by mouth 4 (four) times daily. 7 day course filled 05/29/14    . glipiZIDE (GLUCOTROL) 5 MG tablet Take 5 mg by mouth 2 (two) times daily.      . hydrochlorothiazide (HYDRODIURIL) 25 MG tablet Take 25 mg by mouth daily.    . insulin detemir (LEVEMIR) 100 UNIT/ML injection Inject 60 Units into the skin at bedtime.    . metFORMIN (GLUCOPHAGE) 1000 MG tablet Take 1,000 mg by mouth 2 (two) times daily with a meal.     . metoprolol (TOPROL-XL) 200 MG 24 hr tablet Take 200 mg by mouth daily.     Marland Kitchen oxyCODONE (ROXICODONE) 15 MG immediate release tablet Take 15 mg by mouth every 6 (six) hours as needed for pain.   0  . pantoprazole (PROTONIX) 40 MG tablet Take 40 mg by mouth daily.     . promethazine (PHENERGAN) 25  MG tablet Take 1 tablet (25 mg total) by mouth every 6 (six) hours as needed for nausea or vomiting. 12 tablet 1  . sulfamethoxazole-trimethoprim (BACTRIM DS,SEPTRA DS) 800-160 MG per tablet Take 1 tablet by mouth 2 (two) times daily. 10 day course filled and started 05/29/14  0  . cephALEXin (KEFLEX) 500 MG capsule Take 1 capsule (500 mg total) by mouth 3 (three) times daily. (Patient not taking: Reported on 05/31/2014) 21 capsule 0  . fluconazole (DIFLUCAN) 150 MG tablet TAKE 1 TABLET (150 MG TOTAL) BY MOUTH ONCE. REPEAT IN TWO TO THREE DAYS IF SYMPTOMS PERSIST. (Patient not taking: Reported on 05/31/2014) 2 tablet 0  . fluconazole (DIFLUCAN) 200 MG tablet Take 1 tablet (200 mg total) by mouth daily. Repeat in three days if symptoms persist. (Patient not taking: Reported on 05/31/2014) 2 tablet 1  . hydrochlorothiazide 25 MG tablet Take 1  tablet (25 mg total) by mouth daily. 30 tablet 11  . insulin glargine (LANTUS) 100 UNIT/ML injection Inject 60 units under the skin at bedtime (Patient not taking: Reported on 05/31/2014) 10 mL 12  . Insulin Pen Needle (B-D ULTRAFINE III SHORT PEN) 31G X 8 MM MISC Use as directed once daily with Lantus solostar pen 1 each 11  . metroNIDAZOLE (FLAGYL) 500 MG tablet Take 1 tablet (500 mg total) by mouth 2 (two) times daily. (Patient not taking: Reported on 05/31/2014) 14 tablet 0  . metroNIDAZOLE (FLAGYL) 500 MG tablet Take 1 tablet (500 mg total) by mouth 2 (two) times daily. (Patient not taking: Reported on 05/31/2014) 14 tablet 0  . omeprazole (PRILOSEC) 20 MG capsule Take 1 capsule (20 mg total) by mouth 2 (two) times daily. (Patient not taking: Reported on 05/31/2014) 60 capsule 0  . ondansetron (ZOFRAN ODT) 4 MG disintegrating tablet 18m ODT q4 hours prn nausea/vomit (Patient not taking: Reported on 05/31/2014) 10 tablet 0  . oxyCODONE-acetaminophen (PERCOCET/ROXICET) 5-325 MG per tablet Take 1-2 tablets by mouth every 4 (four) hours as needed for moderate pain or severe pain. (Patient not taking: Reported on 05/31/2014) 10 tablet 0    Blood pressure 110/66, pulse 91, temperature 98.5 F (36.9 C), temperature source Oral, resp. rate 18, height '5\' 8"'  (1.727 m), weight 353 lb (160.12 kg), last menstrual period 04/11/2009, SpO2 95 %. Physical Exam: General: pleasant, morbidly obese black female who is laying in bed in NAD HEENT: head is normocephalic, atraumatic.  Sclera are noninjected.  PERRL.  Ears and nose without any masses or lesions.  Mouth is pink and moist Heart: regular, rate, and rhythm.  Normal s1,s2. No obvious murmurs, gallops, or rubs noted.  Palpable radial and pedal pulses bilaterally Lungs: CTAB, no wheezes, rhonchi, or rales noted.  Respiratory effort nonlabored Abd: soft, large LLQ abdominal wall abscess.  This is about 10x15cm in induration with a larger area of erythema.  There is  central necrosis and opening that is about 4x4 cm in size.  This is very tender. no masses, hernias, or organomegaly MS: all 4 extremities are symmetrical with no cyanosis, clubbing, or edema. Skin: warm and dry with no masses, lesions, or rashes Psych: A&Ox3 with an appropriate affect.    Results for orders placed or performed during the hospital encounter of 05/31/14 (from the past 48 hour(s))  CBC with Differential     Status: Abnormal   Collection Time: 05/31/14  2:55 PM  Result Value Ref Range   WBC 8.4 4.0 - 10.5 K/uL   RBC 4.49 3.87 - 5.11  MIL/uL   Hemoglobin 12.2 12.0 - 15.0 g/dL   HCT 38.2 36.0 - 46.0 %   MCV 85.1 78.0 - 100.0 fL   MCH 27.2 26.0 - 34.0 pg   MCHC 31.9 30.0 - 36.0 g/dL   RDW 13.7 11.5 - 15.5 %   Platelets 443 (H) 150 - 400 K/uL   Neutrophils Relative % 67 43 - 77 %   Neutro Abs 5.6 1.7 - 7.7 K/uL   Lymphocytes Relative 23 12 - 46 %   Lymphs Abs 2.0 0.7 - 4.0 K/uL   Monocytes Relative 9 3 - 12 %   Monocytes Absolute 0.8 0.1 - 1.0 K/uL   Eosinophils Relative 1 0 - 5 %   Eosinophils Absolute 0.1 0.0 - 0.7 K/uL   Basophils Relative 0 0 - 1 %   Basophils Absolute 0.0 0.0 - 0.1 K/uL  Comprehensive metabolic panel     Status: Abnormal   Collection Time: 05/31/14  2:55 PM  Result Value Ref Range   Sodium 132 (L) 135 - 145 mmol/L   Potassium 4.1 3.5 - 5.1 mmol/L   Chloride 98 96 - 112 mmol/L   CO2 25 19 - 32 mmol/L   Glucose, Bld 343 (H) 70 - 99 mg/dL   BUN 14 6 - 23 mg/dL   Creatinine, Ser 1.08 0.50 - 1.10 mg/dL   Calcium 8.5 8.4 - 10.5 mg/dL   Total Protein 8.1 6.0 - 8.3 g/dL   Albumin 3.2 (L) 3.5 - 5.2 g/dL   AST 14 0 - 37 U/L   ALT 10 0 - 35 U/L   Alkaline Phosphatase 101 39 - 117 U/L   Total Bilirubin 0.3 0.3 - 1.2 mg/dL   GFR calc non Af Amer 57 (L) >90 mL/min   GFR calc Af Amer 66 (L) >90 mL/min    Comment: (NOTE) The eGFR has been calculated using the CKD EPI equation. This calculation has not been validated in all clinical situations. eGFR's  persistently <90 mL/min signify possible Chronic Kidney Disease.    Anion gap 9 5 - 15  Lipase, blood     Status: None   Collection Time: 05/31/14  2:55 PM  Result Value Ref Range   Lipase 24 11 - 59 U/L  Glucose, capillary     Status: Abnormal   Collection Time: 05/31/14  5:53 PM  Result Value Ref Range   Glucose-Capillary 279 (H) 70 - 99 mg/dL  CBC     Status: Abnormal   Collection Time: 05/31/14  8:48 PM  Result Value Ref Range   WBC 7.8 4.0 - 10.5 K/uL   RBC 3.97 3.87 - 5.11 MIL/uL   Hemoglobin 10.9 (L) 12.0 - 15.0 g/dL   HCT 33.7 (L) 36.0 - 46.0 %   MCV 84.9 78.0 - 100.0 fL   MCH 27.5 26.0 - 34.0 pg   MCHC 32.3 30.0 - 36.0 g/dL   RDW 13.7 11.5 - 15.5 %   Platelets 397 150 - 400 K/uL  Creatinine, serum     Status: Abnormal   Collection Time: 05/31/14  8:48 PM  Result Value Ref Range   Creatinine, Ser 1.02 0.50 - 1.10 mg/dL   GFR calc non Af Amer 61 (L) >90 mL/min   GFR calc Af Amer 70 (L) >90 mL/min    Comment: (NOTE) The eGFR has been calculated using the CKD EPI equation. This calculation has not been validated in all clinical situations. eGFR's persistently <90 mL/min signify possible Chronic Kidney Disease.  Glucose, capillary     Status: Abnormal   Collection Time: 05/31/14 10:21 PM  Result Value Ref Range   Glucose-Capillary 269 (H) 70 - 99 mg/dL   Comment 1 Notify RN    Comment 2 Documented in Char   Urinalysis, Routine w reflex microscopic     Status: Abnormal   Collection Time: 05/31/14 11:50 PM  Result Value Ref Range   Color, Urine YELLOW YELLOW   APPearance TURBID (A) CLEAR   Specific Gravity, Urine 1.020 1.005 - 1.030   pH 6.5 5.0 - 8.0   Glucose, UA 250 (A) NEGATIVE mg/dL   Hgb urine dipstick TRACE (A) NEGATIVE   Bilirubin Urine NEGATIVE NEGATIVE   Ketones, ur 15 (A) NEGATIVE mg/dL   Protein, ur >300 (A) NEGATIVE mg/dL   Urobilinogen, UA 0.2 0.0 - 1.0 mg/dL   Nitrite NEGATIVE NEGATIVE   Leukocytes, UA NEGATIVE NEGATIVE  Urine microscopic-add  on     Status: Abnormal   Collection Time: 05/31/14 11:50 PM  Result Value Ref Range   Squamous Epithelial / LPF MANY (A) RARE   WBC, UA 0-2 <3 WBC/hpf   RBC / HPF 3-6 <3 RBC/hpf   Bacteria, UA RARE RARE   Urine-Other YEAST   CBC     Status: Abnormal   Collection Time: 06/01/14  6:00 AM  Result Value Ref Range   WBC 6.6 4.0 - 10.5 K/uL   RBC 3.85 (L) 3.87 - 5.11 MIL/uL   Hemoglobin 10.6 (L) 12.0 - 15.0 g/dL   HCT 33.7 (L) 36.0 - 46.0 %   MCV 87.5 78.0 - 100.0 fL   MCH 27.5 26.0 - 34.0 pg   MCHC 31.5 30.0 - 36.0 g/dL   RDW 14.0 11.5 - 15.5 %   Platelets 413 (H) 150 - 400 K/uL  Comprehensive metabolic panel     Status: Abnormal   Collection Time: 06/01/14  6:00 AM  Result Value Ref Range   Sodium 137 135 - 145 mmol/L   Potassium 3.7 3.5 - 5.1 mmol/L   Chloride 100 96 - 112 mmol/L   CO2 31 19 - 32 mmol/L   Glucose, Bld 216 (H) 70 - 99 mg/dL   BUN 15 6 - 23 mg/dL   Creatinine, Ser 1.40 (H) 0.50 - 1.10 mg/dL   Calcium 8.6 8.4 - 10.5 mg/dL   Total Protein 6.7 6.0 - 8.3 g/dL   Albumin 2.5 (L) 3.5 - 5.2 g/dL   AST 16 0 - 37 U/L   ALT 10 0 - 35 U/L   Alkaline Phosphatase 93 39 - 117 U/L   Total Bilirubin 0.4 0.3 - 1.2 mg/dL   GFR calc non Af Amer 41 (L) >90 mL/min   GFR calc Af Amer 48 (L) >90 mL/min    Comment: (NOTE) The eGFR has been calculated using the CKD EPI equation. This calculation has not been validated in all clinical situations. eGFR's persistently <90 mL/min signify possible Chronic Kidney Disease.    Anion gap 6 5 - 15  Glucose, capillary     Status: Abnormal   Collection Time: 06/01/14  8:01 AM  Result Value Ref Range   Glucose-Capillary 195 (H) 70 - 99 mg/dL   Ct Abdomen Pelvis W Contrast  05/31/2014   CLINICAL DATA:  Left abdominal abscess for 8 days, subsequent evaluation  EXAM: CT ABDOMEN AND PELVIS WITH CONTRAST  TECHNIQUE: Multidetector CT imaging of the abdomen and pelvis was performed using the standard protocol following bolus administration of  intravenous contrast.  CONTRAST:  121m OMNIPAQUE IOHEXOL 300 MG/ML  SOLN  COMPARISON:  None.  FINDINGS: Visualized lung bases are clear.  Heart size is normal.  Mild hepatic steatosis. Gallbladder surgically absent. Mild biliary dilatation likely due to status post cholecystectomy. Pancreas shows mild fatty infiltration but is otherwise normal. Spleen is normal.  Left adrenal glands normal. 14 mm right adrenal apex nodule is stable.  Left kidney is normal. The right kidney itself appears normal, but there is mild prominence of the renal pelvis and proximal ureter with mild periureteral inflammation and mild urothelial enhancement. Caliber transition occur is at about the level of the mid ureter. Detail somewhat limited due to patient body habitus but no calculi are identified. On image number 56 there is equivocal punctate increased attenuation within the ureter and this is near the point of caliber transition.  Abdominal aorta shows no evidence of dilatation or other significant abnormality. Bladder is normal. Reproductive organs are normal. There is no ascites.  The stomach and large bowel are normal. The appendix is not seen well. Numerous loops of small bowel in the midline the inferior pelvis and into the right lower quadrant show mild wall thickening. These loops also demonstrate air-fluid levels and are minimally dilated relative to the remainder relatively decompressed small-bowel. The mesentery of these loops shows mild increased attenuation.  In the anterior left pannus inferiorly, there is focal skin thickening consistent with cellulitis, with increased attenuation over an area measuring 48 x 60 mm. There is an 18 mm ovoid area along the cranial margin of this process as well as a 23 mm ovoid area along the inferior margin of this process. These areas show relatively low attenuation of about 16 Hounsfield units.  IMPRESSION: 1. Cellulitis inferior left pannus. This extends approximately 5 cm deep to the  dermis and there are small ovoid areas that could represent small abscesses. 2. Several loops of ileum show mild wall thickening with mild surrounding inflammation and enteritis is suspected. 3. 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 No definite stone is seen currently. Postinflammatory mild stricture in the mid ureter is another consideration. Given the enhancement of the urothelium and mild inflammatory change, pyelonephritis is not excluded. 4. Other nonacute findings as described above   Electronically Signed   By: RSkipper ClicheM.D.   On: 05/31/2014 17:29       Assessment/Plan 1. Abdominal wall abscess 2. AKI 3. HTN 4. DM 5. Morbid obesity  Plan: 1. NPO 2. To OR today for I&D of this abdominal wall abscess 3. Cont IV abx therapy, Vanco and Cleocin 4. Procedure d/w patient and she understands and is agreeable to proceed.  Ermagene Saidi E 06/01/2014, 9:00 AM Pager: [phone]

## 2014-06-01 NOTE — Progress Notes (Signed)
Utilization review completed.  

## 2014-06-01 NOTE — Anesthesia Preprocedure Evaluation (Signed)
Anesthesia Evaluation  Patient identified by MRN, date of birth, ID band Patient awake    Reviewed: Allergy & Precautions, NPO status , Patient's Chart, lab work & pertinent test results  Airway        Dental   Pulmonary sleep apnea ,          Cardiovascular hypertension,     Neuro/Psych Anxiety Depression  Neuromuscular disease    GI/Hepatic GERD-  ,  Endo/Other  diabetes, Type 2, Insulin Dependent, Oral Hypoglycemic AgentsMorbid obesity  Renal/GU Renal InsufficiencyRenal disease     Musculoskeletal  (+) Arthritis -,   Abdominal   Peds  Hematology   Anesthesia Other Findings   Reproductive/Obstetrics                             Anesthesia Physical Anesthesia Plan  ASA: III  Anesthesia Plan: General   Post-op Pain Management:    Induction: Intravenous  Airway Management Planned: Oral ETT  Additional Equipment:   Intra-op Plan:   Post-operative Plan: Extubation in OR  Informed Consent: I have reviewed the patients History and Physical, chart, labs and discussed the procedure including the risks, benefits and alternatives for the proposed anesthesia with the patient or authorized representative who has indicated his/her understanding and acceptance.     Plan Discussed with: CRNA, Anesthesiologist and Surgeon  Anesthesia Plan Comments:         Anesthesia Quick Evaluation

## 2014-06-01 NOTE — Op Note (Signed)
1.  Progress note or procedure note with a detailed description of the procedure. Preoperative diagnosis: abdominal wall abscess Postoperative diagnosis: same as above Procedure: debridement and drainage abdominal wall abscess, 10x12x10 cm area skin and subcutaneous tissue Surgeon: Dr Serita Grammes EBl: minimal Anes: general Complications none Sponge count correct  This is a 86 yof with multiple cormorbidities who presents with abdominal wall abscess for drainage.  After informed consent obtained she was taken to the or. Abx were already administered. SCDs were in place. She was placed under general anesthesia.  She was prepped and draped in standard sterile fashion. Timeout performed.  I made a 10x12 cm elliptical incision and used cautery and scalpel to debride skin and soft tissue to 10 cm. There was purulence and necrotic tissue. Cultures were taken.  I then irrigated, Hemostasis was observed.  I packed this. She tolerated well.  2.  Tool used for debridement (curette, scapel, etc.)  Cautery, scalpel  3.  Frequency of surgical debridement.   once  4.  Measurement of total devitalized tissue (wound surface) before and after surgical debridement.   Same, 10x10x12 cm  5.  Area and depth of devitalized tissue removed from wound.  120 sq cm 10 cm depth  6.  Blood loss and description of tissue removed.  Minimal, necrotic  7.  Evidence of the progress of the wound's response to treatment.  A.  Current wound volume (current dimensions and depth).  120 sq cm, 10 cm deep  B.  Presence (and extent of) of infection.  none  C.  Presence (and extent of) of non viable tissue.  none  D.  Other material in the wound that is expected to inhibit healing.  none  8.  Was there any viable tissue removed (measurements): no

## 2014-06-02 ENCOUNTER — Other Ambulatory Visit: Payer: Self-pay | Admitting: *Deleted

## 2014-06-02 ENCOUNTER — Encounter (HOSPITAL_COMMUNITY): Payer: Self-pay | Admitting: General Surgery

## 2014-06-02 DIAGNOSIS — I10 Essential (primary) hypertension: Secondary | ICD-10-CM

## 2014-06-02 LAB — BASIC METABOLIC PANEL
Anion gap: 4 — ABNORMAL LOW (ref 5–15)
BUN: 15 mg/dL (ref 6–23)
CO2: 28 mmol/L (ref 19–32)
Calcium: 8.1 mg/dL — ABNORMAL LOW (ref 8.4–10.5)
Chloride: 103 mmol/L (ref 96–112)
Creatinine, Ser: 1.39 mg/dL — ABNORMAL HIGH (ref 0.50–1.10)
GFR calc Af Amer: 48 mL/min — ABNORMAL LOW (ref >90)
GFR calc non Af Amer: 42 mL/min — ABNORMAL LOW (ref >90)
Glucose, Bld: 243 mg/dL — ABNORMAL HIGH (ref 70–99)
Potassium: 3.8 mmol/L (ref 3.5–5.1)
Sodium: 135 mmol/L (ref 135–145)

## 2014-06-02 LAB — GLUCOSE, CAPILLARY
Glucose-Capillary: 229 mg/dL — ABNORMAL HIGH (ref 70–99)
Glucose-Capillary: 197 mg/dL — ABNORMAL HIGH (ref 70–99)

## 2014-06-02 LAB — CBC
HCT: 30.2 % — ABNORMAL LOW (ref 36.0–46.0)
Hemoglobin: 9.7 g/dL — ABNORMAL LOW (ref 12.0–15.0)
MCH: 27.6 pg (ref 26.0–34.0)
MCHC: 32.1 g/dL (ref 30.0–36.0)
MCV: 85.8 fL (ref 78.0–100.0)
Platelets: 372 10*3/uL (ref 150–400)
RBC: 3.52 MIL/uL — ABNORMAL LOW (ref 3.87–5.11)
RDW: 13.9 % (ref 11.5–15.5)
WBC: 7.1 10*3/uL (ref 4.0–10.5)

## 2014-06-02 LAB — HIV ANTIBODY (ROUTINE TESTING W REFLEX): HIV Screen 4th Generation wRfx: NONREACTIVE

## 2014-06-02 LAB — HEMOGLOBIN A1C
Hgb A1c MFr Bld: 11.7 % — ABNORMAL HIGH (ref 4.8–5.6)
Mean Plasma Glucose: 289 mg/dL

## 2014-06-02 MED ORDER — OXYCODONE HCL 10 MG PO TABS: 10.0000 mg | ORAL_TABLET | Freq: Four times a day (QID) | ORAL | Status: DC | PRN

## 2014-06-02 MED ORDER — CARISOPRODOL 350 MG PO TABS: ORAL_TABLET | ORAL | Status: DC

## 2014-06-02 MED ORDER — DOXYCYCLINE HYCLATE 50 MG PO CAPS: 100.0000 mg | ORAL_CAPSULE | Freq: Two times a day (BID) | ORAL | Status: DC

## 2014-06-02 MED ORDER — OXYCODONE HCL 10 MG PO TABS: 15.0000 mg | ORAL_TABLET | Freq: Four times a day (QID) | ORAL | Status: DC | PRN

## 2014-06-02 MED ORDER — OXYCODONE HCL 5 MG PO TABS
10.0000 mg | ORAL_TABLET | Freq: Once | ORAL | Status: AC
  Administered 2014-06-02: 10 mg via ORAL

## 2014-06-02 NOTE — Care Management Note (Signed)
    Page 1 of 1   06/02/2014     2:22:12 PM CARE MANAGEMENT NOTE 06/02/2014  Patient:  Maria [name], Maria [name]   Account Number:  [id number]  Date Initiated:  06/02/2014  Documentation initiated by:  Maria [name]  Subjective/Objective Assessment:   dx abd wall abscess  admit- lives with spouse.     Action/Plan:   Anticipated DC Date:  06/02/2014   Anticipated DC Plan:  Maria [name]  CM consult      [facility] Choice  HOME HEALTH   Choice offered to / List presented to:  C-1 Patient        Maria [name] arranged  HH-1 RN      [place].   Status of service:  Completed, signed off Medicare Important Message given?  NO (If response is "NO", the following Medicare IM given date fields will be blank) Date Medicare IM given:   Medicare IM given by:   Date Additional Medicare IM given:   Additional Medicare IM given by:    Discharge Disposition:  Maria [name]  Per UR Regulation:  Reviewed for med. necessity/level of care/duration of stay  If discussed at Maria [name] of Stay Meetings, dates discussed:    Comments:  06/02/14 Maria [name] BSN [phone] patient lives with spouse, for dc , she chose [facility] for [facility] for help with dressing changes.  Referral made to [facility] , [place] notified.  Soc will begin 24-48 hrs post dc.

## 2014-06-02 NOTE — Progress Notes (Addendum)
Amelia Jo discharged Home with family & advance home care for dressing changes per MD order.  Discharge instructions reviewed and discussed with the patient, all questions and concerns answered. Copy of instructions, prescriptions and care notes given to patient.    Medication List    STOP taking these medications        benazepril 40 MG tablet  Commonly known as:  LOTENSIN     cephALEXin 500 MG capsule  Commonly known as:  KEFLEX     erythromycin ethylsuccinate 400 MG tablet  Commonly known as:  EES     fluconazole 150 MG tablet  Commonly known as:  DIFLUCAN     fluconazole 200 MG tablet  Commonly known as:  DIFLUCAN     insulin glargine 100 UNIT/ML injection  Commonly known as:  LANTUS     oxyCODONE-acetaminophen 5-325 MG per tablet  Commonly known as:  PERCOCET/ROXICET     sulfamethoxazole-trimethoprim 800-160 MG per tablet  Commonly known as:  BACTRIM DS,SEPTRA DS      TAKE these medications        ALPRAZolam 1 MG tablet  Commonly known as:  XANAX  Take 1 mg by mouth 3 (three) times daily as needed for anxiety.     carisoprodol 350 MG tablet  Commonly known as:  SOMA  Take one tablet by mouth twice a day as needed for pain and spasm     colchicine 0.6 MG tablet  Commonly known as:  COLCRYS  Take 1 tablet (0.6 mg total) by mouth daily.     doxycycline 50 MG capsule  Commonly known as:  VIBRAMYCIN  Take 2 capsules (100 mg total) by mouth 2 (two) times daily.     glipiZIDE 5 MG tablet  Commonly known as:  GLUCOTROL  Take 5 mg by mouth 2 (two) times daily.     hydrochlorothiazide 25 MG tablet  Commonly known as:  HYDRODIURIL  Take 25 mg by mouth daily.     insulin detemir 100 UNIT/ML injection  Commonly known as:  LEVEMIR  Inject 60 Units into the skin at bedtime.     Insulin Pen Needle 31G X 8 MM Misc  Commonly known as:  B-D ULTRAFINE III SHORT PEN  Use as directed once daily with Lantus solostar pen     metFORMIN 1000 MG tablet  Commonly known  as:  GLUCOPHAGE  Take 1,000 mg by mouth 2 (two) times daily with a meal.     metoprolol 200 MG 24 hr tablet  Commonly known as:  TOPROL-XL  Take 200 mg by mouth daily.     metroNIDAZOLE 500 MG tablet  Commonly known as:  FLAGYL  Take 1 tablet (500 mg total) by mouth 2 (two) times daily.     omeprazole 20 MG capsule  Commonly known as:  PRILOSEC  Take 1 capsule (20 mg total) by mouth 2 (two) times daily.     ondansetron 4 MG disintegrating tablet  Commonly known as:  ZOFRAN ODT  4mg  ODT q4 hours prn nausea/vomit     Oxycodone HCl 10 MG Tabs  Take 1.5-2 tablets (15-20 mg total) by mouth every 6 (six) hours as needed.     pantoprazole 40 MG tablet  Commonly known as:  PROTONIX  Take 40 mg by mouth daily.     promethazine 25 MG tablet  Commonly known as:  PHENERGAN  Take 1 tablet (25 mg total) by mouth every 6 (six) hours as needed for nausea or vomiting.  Patients skin is clean, dry and intact, no evidence of skin break down, except for surgical wound to left lower abd.  Demonstrated to pt. Boyfriend Delancy how to do dressing changes and instructed both pt. And boyfriend to monitor for signs & symptoms of infection. IV site discontinued and catheter remains intact. Site without signs and symptoms of complications. Dressing and pressure applied.  Patient escorted to car by volunteer in a wheelchair,  no distress noted upon discharge.  Wynetta Emery, Erie Radu C 06/02/2014 12:03 PM

## 2014-06-02 NOTE — Progress Notes (Signed)
Patient's new address is [address] [address], phone [phone].

## 2014-06-02 NOTE — Discharge Instructions (Signed)
Dressing Change, twice a day A dressing is a material placed over wounds. It keeps the wound clean, dry, and protected from further injury. This provides an environment that favors wound healing.  BEFORE YOU BEGIN  Get your supplies together. Things you may need include:  Saline solution.  Flexible gauze dressing.  Medicated cream.  Tape.  Gloves.  Abdominal dressing pads.  Gauze squares.  Plastic bags.  Take pain medicine 30 minutes before the dressing change if you need it.  Take a shower before you do the first dressing change of the day. Use plastic wrap or a plastic bag to prevent the dressing from getting wet. REMOVING YOUR OLD DRESSING   Wash your hands with soap and water. Dry your hands with a clean towel.  Put on your gloves.  Remove any tape.  Carefully remove the old dressing. If the dressing sticks, you may dampen it with warm water to loosen it, or follow your caregiver's specific directions.  Remove any gauze or packing tape that is in your wound.  Take off your gloves.  Put the gloves, tape, gauze, or any packing tape into a plastic bag. CHANGING YOUR DRESSING  Open the supplies.  Take the cap off the saline solution.  Open the gauze package so that the gauze remains on the inside of the package.  Put on your gloves.  Clean your wound as told by your caregiver.  If you have been told to keep your wound dry, follow those instructions.  Your caregiver may tell you to do one or more of the following:  Pick up the gauze. Pour the saline solution over the gauze. Squeeze out the extra saline solution.  Put medicated cream or other medicine on your wound if you have been told to do so.  Put the solution soaked gauze only in your wound, not on the skin around it.  Pack your wound loosely or as told by your caregiver.  Put dry gauze on your wound.  Put abdominal dressing pads over the dry gauze if your wet gauze soaks through.  Tape the  abdominal dressing pads in place so they will not fall off. Do not wrap the tape completely around the affected part (arm, leg, abdomen).  Wrap the dressing pads with a flexible gauze dressing to secure it in place.  Take off your gloves. Put them in the plastic bag with the old dressing. Tie the bag shut and throw it away.  Keep the dressing clean and dry until your next dressing change.  Wash your hands. SEEK MEDICAL CARE IF:  Your skin around the wound looks red.  Your wound feels more tender or sore.  You see pus in the wound.  Your wound smells bad.  You have a fever.  Your skin around the wound has a rash that itches and burns.  You see black or yellow skin in your wound that was not there before.  You feel nauseous, throw up, and feel very tired. Document Released: 05/05/2004 Document Revised: 06/20/2011 Document Reviewed: 02/07/2011 [facility] Patient Information 2015 [place], [place]. This information is not intended to replace advice given to you by your health care provider. Make sure you discuss any questions you have with your health care provider.

## 2014-06-02 NOTE — Progress Notes (Signed)
Patient ID: Maria [name], female   DOB: October 22, 1960, 54 y.o.   MRN: [id number] 1 Day Post-Op  Subjective: Pt feels well this morning.  Pain is much better.  Wants to go home.  Objective: Vital signs in last 24 hours: Temp:  [97.4 F (36.3 C)-98.2 F (36.8 C)] 98 F (36.7 C) (02/21 2117) Pulse Rate:  [75-98] 79 (02/21 2117) Resp:  [18-20] 18 (02/21 2117) BP: (85-127)/(44-71) 127/62 mmHg (02/21 2117) SpO2:  [88 %-98 %] 94 % (02/21 2334) Last BM Date: 06/01/14  Intake/Output from previous day: 02/21 0701 - 02/22 0700 In: 1028.8 [P.O.:240; I.V.:788.8] Out: 700 [Urine:700] Intake/Output this shift:    PE: Abd: abdominal wound is clean and packed.  Decrease in surrounding erythema and pain.  Lab Results:   Recent Labs  05/31/14 2048 06/01/14 0600  WBC 7.8 6.6  HGB 10.9* 10.6*  HCT 33.7* 33.7*  PLT 397 413*   BMET  Recent Labs  05/31/14 1455 05/31/14 2048 06/01/14 0600  NA 132*  --  137  K 4.1  --  3.7  CL 98  --  100  CO2 25  --  31  GLUCOSE 343*  --  216*  BUN 14  --  15  CREATININE 1.08 1.02 1.40*  CALCIUM 8.5  --  8.6   PT/INR No results for input(s): LABPROT, INR in the last 72 hours. CMP     Component Value Date/Time   NA 137 06/01/2014 0600   K 3.7 06/01/2014 0600   CL 100 06/01/2014 0600   CO2 31 06/01/2014 0600   GLUCOSE 216* 06/01/2014 0600   BUN 15 06/01/2014 0600   CREATININE 1.40* 06/01/2014 0600   CALCIUM 8.6 06/01/2014 0600   PROT 6.7 06/01/2014 0600   ALBUMIN 2.5* 06/01/2014 0600   AST 16 06/01/2014 0600   ALT 10 06/01/2014 0600   ALKPHOS 93 06/01/2014 0600   BILITOT 0.4 06/01/2014 0600   GFRNONAA 41* 06/01/2014 0600   GFRAA 48* 06/01/2014 0600   Lipase     Component Value Date/Time   LIPASE 24 05/31/2014 1455       Studies/Results: Ct Abdomen Pelvis W Contrast  05/31/2014   CLINICAL DATA:  Left abdominal abscess for 8 days, subsequent evaluation  EXAM: CT ABDOMEN AND PELVIS WITH CONTRAST  TECHNIQUE: Multidetector CT  imaging of the abdomen and pelvis was performed using the standard protocol following bolus administration of intravenous contrast.  CONTRAST:  131mL OMNIPAQUE IOHEXOL 300 MG/ML  SOLN  COMPARISON:  None.  FINDINGS: Visualized lung bases are clear.  Heart size is normal.  Mild hepatic steatosis. Gallbladder surgically absent. Mild biliary dilatation likely due to status post cholecystectomy. Pancreas shows mild fatty infiltration but is otherwise normal. Spleen is normal.  Left adrenal glands normal. 14 mm right adrenal apex nodule is stable.  Left kidney is normal. The right kidney itself appears normal, but there is mild prominence of the renal pelvis and proximal ureter with mild periureteral inflammation and mild urothelial enhancement. Caliber transition occur is at about the level of the mid ureter. Detail somewhat limited due to patient body habitus but no calculi are identified. On image number 56 there is equivocal punctate increased attenuation within the ureter and this is near the point of caliber transition.  Abdominal aorta shows no evidence of dilatation or other significant abnormality. Bladder is normal. Reproductive organs are normal. There is no ascites.  The stomach and large bowel are normal. The appendix is not seen well. Numerous loops of  small bowel in the midline the inferior pelvis and into the right lower quadrant show mild wall thickening. These loops also demonstrate air-fluid levels and are minimally dilated relative to the remainder relatively decompressed small-bowel. The mesentery of these loops shows mild increased attenuation.  In the anterior left pannus inferiorly, there is focal skin thickening consistent with cellulitis, with increased attenuation over an area measuring 48 x 60 mm. There is an 18 mm ovoid area along the cranial margin of this process as well as a 23 mm ovoid area along the inferior margin of this process. These areas show relatively low attenuation of about 16  Hounsfield units.  IMPRESSION: 1. Cellulitis inferior left pannus. This extends approximately 5 cm deep to the dermis and there are small ovoid areas that could represent small abscesses. 2. Several loops of ileum show mild wall thickening with mild surrounding inflammation and enteritis is suspected. 3. 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 No definite stone is seen currently. Postinflammatory mild stricture in the mid ureter is another consideration. Given the enhancement of the urothelium and mild inflammatory change, pyelonephritis is not excluded. 4. Other nonacute findings as described above   Electronically Signed   By: Skipper Cliche M.D.   On: 05/31/2014 17:29    Anti-infectives: Anti-infectives    Start     Dose/Rate Route Frequency Ordered Stop   06/01/14 0800  vancomycin (VANCOCIN) 1,250 mg in sodium chloride 0.9 % 250 mL IVPB     1,250 mg 166.7 mL/hr over 90 Minutes Intravenous Every 12 hours 05/31/14 1857     05/31/14 2000  vancomycin (VANCOCIN) 2,500 mg in sodium chloride 0.9 % 500 mL IVPB     2,500 mg 250 mL/hr over 120 Minutes Intravenous  Once 05/31/14 1854 05/31/14 2215   05/31/14 1900  vancomycin (VANCOCIN) IVPB 1000 mg/200 mL premix  Status:  Discontinued     1,000 mg 200 mL/hr over 60 Minutes Intravenous  Once 05/31/14 1847 05/31/14 1854   05/31/14 1445  clindamycin (CLEOCIN) IVPB 600 mg     600 mg 100 mL/hr over 30 Minutes Intravenous  Once 05/31/14 1442 05/31/14 1535       Assessment/Plan  1. POD 1, s/p I&D of abdominal wall abscess 2. HTN 3. DM  Plan: 1. Patient doing well.  CXs pending.  Patient is stable for dc home.   2. HH RN has been set up for BID dressing changes at home.   3. D/W Dr. Sloan Leiter, ok to send back home and finish up her dose of Septra DS.  She has 8 remaining days.   LOS: 2 days    Maria [name]  E 06/02/2014, 8:11 AM Pager: [phone]

## 2014-06-02 NOTE — Discharge Summary (Addendum)
PATIENT DETAILS Name: Maria [name] Age: 54 y.o. Sex: female Date of Birth: 1961-01-12 MRN: [id number]. Admitting Physician: Florencia Reasons, MD [id number], Gwynneth Munson, MD  Admit Date: 05/31/2014 Discharge date: 06/02/2014  Recommendations for Outpatient Follow-up:  1. Please check CBC and BMET at next visit with PCP 2. Please follow blood and wound cultures-pending at the time of discharge.  PRIMARY DISCHARGE DIAGNOSIS:  Principal Problem:   Cellulitis and abscess Active Problems:   Diabetes type 2, uncontrolled   Hyperlipidemia   Depression   Obstructive sleep apnea   Essential hypertension   GERD   Recurrent UTI   Osteoarthrosis, unspecified whether generalized or localized, involving lower leg      PAST MEDICAL HISTORY: Past Medical History  Diagnosis Date  . Hypertension   . Diabetes mellitus   . Gout   . Nephrolithiasis     recurrent  . Osteoarthritis, knee   . GERD (gastroesophageal reflux disease)   . Depression   . Rotator cuff syndrome of left shoulder   . Carpal tunnel syndrome, left   . Anxiety   . DUB (dysfunctional uterine bleeding)   . Allergic rhinitis   . External hemorrhoid   . Morbid obesity   . Recurrent UTI   . Obesity     DISCHARGE MEDICATIONS: Current Discharge Medication List    START taking these medications   Details  doxycycline (VIBRAMYCIN) 50 MG capsule Take 2 capsules (100 mg total) by mouth 2 (two) times daily. Qty: 30 capsule, Refills: 0      CONTINUE these medications which have CHANGED   Details  Oxycodone HCl 10 MG TABS Take 1.5-2 tablets (15-20 mg total) by mouth every 6 (six) hours as needed. Qty: 45 tablet, Refills: 0      CONTINUE these medications which have NOT CHANGED   Details  ALPRAZolam (XANAX) 1 MG tablet Take 1 mg by mouth 3 (three) times daily as needed for anxiety.     carisoprodol (SOMA) 350 MG tablet Take one tablet by mouth twice a day as needed for pain and spasm Qty: 60 tablet, Refills: 2      colchicine (COLCRYS) 0.6 MG tablet Take 1 tablet (0.6 mg total) by mouth daily. Qty: 30 tablet, Refills: 12    glipiZIDE (GLUCOTROL) 5 MG tablet Take 5 mg by mouth 2 (two) times daily.      hydrochlorothiazide (HYDRODIURIL) 25 MG tablet Take 25 mg by mouth daily.    insulin detemir (LEVEMIR) 100 UNIT/ML injection Inject 60 Units into the skin at bedtime.    metFORMIN (GLUCOPHAGE) 1000 MG tablet Take 1,000 mg by mouth 2 (two) times daily with a meal.     metoprolol (TOPROL-XL) 200 MG 24 hr tablet Take 200 mg by mouth daily.     pantoprazole (PROTONIX) 40 MG tablet Take 40 mg by mouth daily.     promethazine (PHENERGAN) 25 MG tablet Take 1 tablet (25 mg total) by mouth every 6 (six) hours as needed for nausea or vomiting. Qty: 12 tablet, Refills: 1    Insulin Pen Needle (B-D ULTRAFINE III SHORT PEN) 31G X 8 MM MISC Use as directed once daily with Lantus solostar pen Qty: 1 each, Refills: 11   Associated Diagnoses: Diabetes mellitus    metroNIDAZOLE (FLAGYL) 500 MG tablet Take 1 tablet (500 mg total) by mouth 2 (two) times daily. Qty: 14 tablet, Refills: 0   Associated Diagnoses: BV (bacterial vaginosis)    omeprazole (PRILOSEC) 20 MG capsule Take 1 capsule (20 mg total) by  mouth 2 (two) times daily. Qty: 60 capsule, Refills: 0    ondansetron (ZOFRAN ODT) 4 MG disintegrating tablet 4mg  ODT q4 hours prn nausea/vomit Qty: 10 tablet, Refills: 0      STOP taking these medications     benazepril (LOTENSIN) 40 MG tablet      erythromycin ethylsuccinate (EES) 400 MG tablet      sulfamethoxazole-trimethoprim (BACTRIM DS,SEPTRA DS) 800-160 MG per tablet      cephALEXin (KEFLEX) 500 MG capsule      fluconazole (DIFLUCAN) 150 MG tablet      fluconazole (DIFLUCAN) 200 MG tablet      insulin glargine (LANTUS) 100 UNIT/ML injection      oxyCODONE-acetaminophen (PERCOCET/ROXICET) 5-325 MG per tablet         ALLERGIES:   Allergies  Allergen Reactions  . Propoxyphene  N-Acetaminophen Palpitations    BRIEF HPI:  See H&P, Labs, Consult and Test reports for all details in brief, patient was admitted for evaluation of persistent lower abdominal pain/swelling, she was found to have anterior abdominal abscess and admitted for further treatment.  CONSULTATIONS:   general surgery  PERTINENT RADIOLOGIC STUDIES: Ct Abdomen Pelvis W Contrast  05/31/2014   CLINICAL DATA:  Left abdominal abscess for 8 days, subsequent evaluation  EXAM: CT ABDOMEN AND PELVIS WITH CONTRAST  TECHNIQUE: Multidetector CT imaging of the abdomen and pelvis was performed using the standard protocol following bolus administration of intravenous contrast.  CONTRAST:  180mL OMNIPAQUE IOHEXOL 300 MG/ML  SOLN  COMPARISON:  None.  FINDINGS: Visualized lung bases are clear.  Heart size is normal.  Mild hepatic steatosis. Gallbladder surgically absent. Mild biliary dilatation likely due to status post cholecystectomy. Pancreas shows mild fatty infiltration but is otherwise normal. Spleen is normal.  Left adrenal glands normal. 14 mm right adrenal apex nodule is stable.  Left kidney is normal. The right kidney itself appears normal, but there is mild prominence of the renal pelvis and proximal ureter with mild periureteral inflammation and mild urothelial enhancement. Caliber transition occur is at about the level of the mid ureter. Detail somewhat limited due to patient body habitus but no calculi are identified. On image number 56 there is equivocal punctate increased attenuation within the ureter and this is near the point of caliber transition.  Abdominal aorta shows no evidence of dilatation or other significant abnormality. Bladder is normal. Reproductive organs are normal. There is no ascites.  The stomach and large bowel are normal. The appendix is not seen well. Numerous loops of small bowel in the midline the inferior pelvis and into the right lower quadrant show mild wall thickening. These loops also  demonstrate air-fluid levels and are minimally dilated relative to the remainder relatively decompressed small-bowel. The mesentery of these loops shows mild increased attenuation.  In the anterior left pannus inferiorly, there is focal skin thickening consistent with cellulitis, with increased attenuation over an area measuring 48 x 60 mm. There is an 18 mm ovoid area along the cranial margin of this process as well as a 23 mm ovoid area along the inferior margin of this process. These areas show relatively low attenuation of about 16 Hounsfield units.  IMPRESSION: 1. Cellulitis inferior left pannus. This extends approximately 5 cm deep to the dermis and there are small ovoid areas that could represent small abscesses. 2. Several loops of ileum show mild wall thickening with mild surrounding inflammation and enteritis is suspected. 3. The appearance of the right renal pelvis and proximal half of the  ureter may reflect mild chronic dilatation in this patient with history of obstructive nephropathy due to stone. No definite stone is seen currently. Postinflammatory mild stricture in the mid ureter is another consideration. Given the enhancement of the urothelium and mild inflammatory change, pyelonephritis is not excluded. 4. Other nonacute findings as described above   Electronically Signed   By: Skipper Cliche M.D.   On: 05/31/2014 17:29     PERTINENT LAB RESULTS: CBC:  Recent Labs  06/01/14 0600 06/02/14 0755  WBC 6.6 7.1  HGB 10.6* 9.7*  HCT 33.7* 30.2*  PLT 413* 372   CMET CMP     Component Value Date/Time   NA 135 06/02/2014 0755   K 3.8 06/02/2014 0755   CL 103 06/02/2014 0755   CO2 28 06/02/2014 0755   GLUCOSE 243* 06/02/2014 0755   BUN 15 06/02/2014 0755   CREATININE 1.39* 06/02/2014 0755   CALCIUM 8.1* 06/02/2014 0755   PROT 6.7 06/01/2014 0600   ALBUMIN 2.5* 06/01/2014 0600   AST 16 06/01/2014 0600   ALT 10 06/01/2014 0600   ALKPHOS 93 06/01/2014 0600   BILITOT 0.4  06/01/2014 0600   GFRNONAA 42* 06/02/2014 0755   GFRAA 48* 06/02/2014 0755    GFR Estimated Creatinine Clearance: 73.9 mL/min (by C-G formula based on Cr of 1.39).  Recent Labs  05/31/14 1455  LIPASE 24   No results for input(s): CKTOTAL, CKMB, CKMBINDEX, TROPONINI in the last 72 hours. Invalid input(s): POCBNP No results for input(s): DDIMER in the last 72 hours.  Recent Labs  06/01/14 0600  HGBA1C 11.7*   No results for input(s): CHOL, HDL, LDLCALC, TRIG, CHOLHDL, LDLDIRECT in the last 72 hours. No results for input(s): TSH, T4TOTAL, T3FREE, THYROIDAB in the last 72 hours.  Invalid input(s): FREET3 No results for input(s): VITAMINB12, FOLATE, FERRITIN, TIBC, IRON, RETICCTPCT in the last 72 hours. Coags: No results for input(s): INR in the last 72 hours.  Invalid input(s): PT Microbiology: Recent Results (from the past 240 hour(s))  Culture, blood (routine x 2)     Status: None (Preliminary result)   Collection Time: 05/31/14  8:48 PM  Result Value Ref Range Status   Specimen Description BLOOD LEFT ARM  Final   Special Requests BOTTLES DRAWN AEROBIC AND ANAEROBIC 5CC  Final   Culture   Final           BLOOD CULTURE RECEIVED NO GROWTH TO DATE CULTURE WILL BE HELD FOR 5 DAYS BEFORE ISSUING A FINAL NEGATIVE REPORT Performed at [facility]    Report Status PENDING  Incomplete  Culture, blood (routine x 2)     Status: None (Preliminary result)   Collection Time: 05/31/14  8:55 PM  Result Value Ref Range Status   Specimen Description BLOOD RIGHT HAND  Final   Special Requests BOTTLES DRAWN AEROBIC ONLY 3CC  Final   Culture   Final           BLOOD CULTURE RECEIVED NO GROWTH TO DATE CULTURE WILL BE HELD FOR 5 DAYS BEFORE ISSUING A FINAL NEGATIVE REPORT Performed at [facility]    Report Status PENDING  Incomplete  Anaerobic culture     Status: None (Preliminary result)   Collection Time: 06/01/14 11:11 AM  Result Value Ref Range Status   Specimen  Description ABSCESS  Final   Special Requests NONE ABDOMINAL WALL  Final   Gram Stain PENDING  Incomplete   Culture   Final    NO ANAEROBES ISOLATED;  CULTURE IN PROGRESS FOR 5 DAYS Performed at [facility]    Report Status PENDING  Incomplete  Culture, routine-abscess     Status: None (Preliminary result)   Collection Time: 06/01/14 11:11 AM  Result Value Ref Range Status   Specimen Description ABSCESS  Final   Special Requests NONE ABDOMINAL WALL  Final   Gram Stain PENDING  Incomplete   Culture   Final    Culture reincubated for better growth Performed at [facility]    Report Status PENDING  Incomplete  Surgical pcr screen     Status: Abnormal   Collection Time: 06/01/14 12:34 PM  Result Value Ref Range Status   MRSA, PCR POSITIVE (A) NEGATIVE Final   Staphylococcus aureus POSITIVE (A) NEGATIVE Final    Comment:        The Xpert SA Assay (FDA approved for NASAL specimens in patients over 89 years of age), is one component of a comprehensive surveillance program.  Test performance has been validated by [facility] for patients greater than or equal to 12 year old. It is not intended to diagnose infection nor to guide or monitor treatment.      BRIEF HOSPITAL COURSE:  Cellulitis and abscess of ant abd wall: ongoing for 2 weeks, failed outpatient therapy with Keflex and Septra.On exam was found to have a indurated area around 5-6 cm in size that was thought to be a organized abscess, general surgery was consulted, patient underwent incision and drainage on 2/21. Wound cultures and blood cultures are currently pending, patient was seen by general surgery on the day of discharge, current recommendations are to discharge with oral antibiotics. Home health RN has been arranged, patient claims that she lives with her boyfriend in a sister, who can help with dressing changes as well. She will be transitioned to doxycycline instead of Bactrim given her mild renal  insufficiency. She will follow-up with general surgery as outlined below.   Active Problems:  AKI: Patient presented with very mild acute renal insufficiency, she was treated with IV fluids, creatinine still minimally elevated at 1.39. She appears euvolemic and is tolerating diet. We will cautiously continue with all of her antihypertensive medications except benazepril, but will stop Bactrim that she was taking prior to this admission and instead give her doxycycline. I have asked her to go to her PCP within a week to recheck electrolytes.    Diabetes type 2, uncontrolled:continue Lantus, glipizide and metformin on discharge   Essential hypertension:continue hctz and metoprolol. As noted above, creatinine only minimally elevated, we will cautiously continue withl antihypertensive medications and have patient go to her primary care practitioner in one week to repeat electrolytes.If renal function is better, then benazepril can be resumed. Further adjustment of her antihypertensive medication defer to the outpatient setting to her primary care practitioner.   GERD:c/w PPI   Depression/Anxiety:stable,c/w Xanax  TODAY-DAY OF DISCHARGE:  Subjective:   Maria [name] today has no headache,no chest abdominal pain,no new weakness tingling or numbness, feels much better wants to go home today.   Objective:   Blood pressure 117/70, pulse 89, temperature 98 F (36.7 C), temperature source Oral, resp. rate 18, height 5\' 8"  (1.727 m), weight 160.12 kg (353 lb), last menstrual period 04/11/2009, SpO2 95 %.  Intake/Output Summary (Last 24 hours) at 06/02/14 1158 Last data filed at 06/02/14 1147  Gross per 24 hour  Intake 828.75 ml  Output    900 ml  Net -71.25 ml   [employer]  05/31/14 1345 05/31/14 1748  Weight: 133.811 kg (295 lb) 160.12 kg (353 lb)    Exam Awake Alert, Oriented *3, No new F.N deficits, Normal affect [place].AT,PERRAL Supple Neck,No JVD, No cervical lymphadenopathy  appriciated.  Symmetrical Chest wall movement, Good air movement bilaterally, CTAB RRR,No Gallops,Rubs or new Murmurs, No Parasternal Heave +ve B.Sounds, Abd Soft, Non tender, No organomegaly appriciated, No rebound -guarding or rigidity. No Cyanosis, Clubbing or edema, No new Rash or bruise  DISCHARGE CONDITION: Stable  DISPOSITION: Home with home health services  DISCHARGE INSTRUCTIONS:    Activity:  As tolerated  Diet recommendation: Diabetic Diet Heart Healthy diet  Discharge Instructions    Call MD for:  redness, tenderness, or signs of infection (pain, swelling, redness, odor or green/yellow discharge around incision site)    Complete by:  As directed      Call MD for:  temperature >100.4    Complete by:  As directed      Diet - low sodium heart healthy    Complete by:  As directed      Diet Carb Modified    Complete by:  As directed      Discharge wound care:    Complete by:  As directed   Dressing Change, twice a day A dressing is a material placed over wounds. It keeps the wound clean, dry, and protected from further injury. This provides an environment that favors wound healing.  BEFORE YOU BEGIN Get your supplies together. Things you may need include: Saline solution. Flexible gauze dressing. Medicated cream. Tape. Gloves. Abdominal dressing pads. Gauze squares. Plastic bags. Take pain medicine 30 minutes before the dressing change if you need it. Take a shower before you do the first dressing change of the day. Use plastic wrap or a plastic bag to prevent the dressing from getting wet. REMOVING YOUR OLD DRESSING  Wash your hands with soap and water. Dry your hands with a clean towel. Put on your gloves. Remove any tape. Carefully remove the old dressing. If the dressing sticks, you may dampen it with warm water to loosen it, or follow your caregiver's specific directions. Remove any gauze or packing tape that is in your wound. Take off your gloves. Put  the gloves, tape, gauze, or any packing tape into a plastic bag. CHANGING YOUR DRESSING Open the supplies. Take the cap off the saline solution. Open the gauze package so that the gauze remains on the inside of the package. Put on your gloves. Clean your wound as told by your caregiver. If you have been told to keep your wound dry, follow those instructions. Your caregiver may tell you to do one or more of the following: Pick up the gauze. Pour the saline solution over the gauze. Squeeze out the extra saline solution. Put medicated cream or other medicine on your wound if you have been told to do so. Put the solution soaked gauze only in your wound, not on the skin around it. Pack your wound loosely or as told by your caregiver. Put dry gauze on your wound. Put abdominal dressing pads over the dry gauze if your wet gauze soaks through. Tape the abdominal dressing pads in place so they will not fall off. Do not wrap the tape completely around the affected part (arm, leg, abdomen). Wrap the dressing pads with a flexible gauze dressing to secure it in place. Take off your gloves. Put them in the plastic bag with the old dressing. Tie the bag shut and throw  it away. Keep the dressing clean and dry until your next dressing change. Wash your hands. SEEK MEDICAL CARE IF: Your skin around the wound looks red. Your wound feels more tender or sore. You see pus in the wound. Your wound smells bad. You have a fever. Your skin around the wound has a rash that itches and burns. You see black or yellow skin in your wound that was not there before. You feel nauseous, throw up, and feel very tired.     Increase activity slowly    Complete by:  As directed            Follow-up Information    Follow up with [facility] On 06/24/2014.   Why:  Doc of the Week clinic, 1:45pm, arrive no later than 1:15pm for paperwork, For wound re-check   Contact information:   [place] [address] [phone]       Follow up with Rolm Bookbinder, MD.   Specialty:  General Surgery   Why:  follow up with dr. Donne Hazel after you see the Doc of the Week clinic, we will make that appointment for you when you see [place] on 06-24-14   Contact information:   [address] [place] [address] [phone]       Follow up with Ricke Hey, MD On 06/11/2014.   Specialty:  Family Medicine   Why:  APPT scheduled for Wednesday, March 02nd @10 :00am. If patient needs to change the APPT, patient can call [phone]   Contact information:   [address] [place] [address] [phone]       Follow up with [facility].   Why:  hhrn   Contact information:   [place] [address] [phone]       Total Time spent on discharge equals 45 minutes.  SignedOren Binet 06/02/2014 11:58 AM

## 2014-06-04 LAB — CULTURE, ROUTINE-ABSCESS

## 2014-06-06 LAB — ANAEROBIC CULTURE

## 2014-06-07 LAB — CULTURE, BLOOD (ROUTINE X 2)
Culture: NO GROWTH
Culture: NO GROWTH

## 2014-07-22 ENCOUNTER — Ambulatory Visit (INDEPENDENT_AMBULATORY_CARE_PROVIDER_SITE_OTHER): Payer: PPO | Admitting: Sports Medicine

## 2014-07-22 ENCOUNTER — Encounter: Payer: Self-pay | Admitting: Sports Medicine

## 2014-07-22 VITALS — BP 143/93 | Ht 68.0 in | Wt 253.0 lb

## 2014-07-22 DIAGNOSIS — M549 Dorsalgia, unspecified: Secondary | ICD-10-CM | POA: Diagnosis not present

## 2014-07-22 DIAGNOSIS — G8929 Other chronic pain: Secondary | ICD-10-CM

## 2014-07-22 MED ORDER — METHYLPREDNISOLONE ACETATE 40 MG/ML IJ SUSP
40.0000 mg | Freq: Once | INTRAMUSCULAR | Status: AC
  Administered 2014-07-22: 40 mg via INTRA_ARTICULAR

## 2014-07-22 NOTE — Progress Notes (Signed)
   Subjective:    Patient ID: Maria [name], female    DOB: 05-28-1960, 54 y.o.   MRN: [id number]  HPI chief complaint: Low back pain and left hip pain  Patient comes in today complaining of 6 months of left-sided low back pain and left hip pain. She localizes her pain around the area of the SI joint. Pain is present primarily with activity but also present at rest. She was seen in our office back in December and diagnosed with greater trochanteric bursitis. She received an injection into her greater trochanteric bursa and the lateral hip pain resolved but her posterior hip pain persists. She denies any numbness or tingling down the leg. She denies any weakness. She denies any groin pain. She denies any recent trauma. Past medical history were reviewed Current medications reviewed Allergies reviewed Labs reviewed    Review of Systems     Objective:   Physical Exam Obese. No acute distress  There is tenderness to palpation along the area of the left SI joint. Diffuse tenderness along the piriformis muscle as well. Pain with FABER on the left. No tenderness over the greater trochanteric bursa. Smooth painless hip range of motion. Negative logroll. Negative leg raise. No focal neurological deficits of either lower extremity.       Assessment & Plan:  Posterior left hip pain likely secondary to SI joint dysfunction/lumbar degenerative disc disease  I recommend a simple IM Depo-Medrol injection. We will use only 40 mg given her uncontrolled diabetes. I do not want to inject her with Toradol as she has had laboratory evidence of mild renal insufficiency. If patient's symptoms persist then consider plain x-rays of the lumbar spine and left hip. We could also consider physical therapy. Follow-up for ongoing or recalcitrant issues.

## 2014-07-24 ENCOUNTER — Other Ambulatory Visit: Payer: Self-pay | Admitting: *Deleted

## 2014-07-24 MED ORDER — CARISOPRODOL 350 MG PO TABS: ORAL_TABLET | ORAL | Status: DC

## 2014-08-06 ENCOUNTER — Telehealth: Payer: Self-pay | Admitting: *Deleted

## 2014-08-06 DIAGNOSIS — B379 Candidiasis, unspecified: Secondary | ICD-10-CM

## 2014-08-06 MED ORDER — FLUCONAZOLE 150 MG PO TABS: 150.0000 mg | ORAL_TABLET | Freq: Once | ORAL | Status: DC

## 2014-08-06 NOTE — Telephone Encounter (Signed)
Patient called and needed something called in for a yeast infection.  I have sent in Diflucan for patient.

## 2014-08-07 ENCOUNTER — Telehealth: Payer: Self-pay | Admitting: *Deleted

## 2014-08-07 ENCOUNTER — Encounter: Payer: Self-pay | Admitting: *Deleted

## 2014-08-07 DIAGNOSIS — N760 Acute vaginitis: Principal | ICD-10-CM

## 2014-08-07 DIAGNOSIS — B9689 Other specified bacterial agents as the cause of diseases classified elsewhere: Secondary | ICD-10-CM

## 2014-08-07 MED ORDER — METRONIDAZOLE 500 MG PO TABS: 500.0000 mg | ORAL_TABLET | Freq: Two times a day (BID) | ORAL | Status: DC

## 2014-08-07 NOTE — Telephone Encounter (Signed)
Patient would like rx for flagyl called in as well because she usually gets bv and yeast at the same time and she feels like this is whats going on now.

## 2014-08-08 ENCOUNTER — Ambulatory Visit (INDEPENDENT_AMBULATORY_CARE_PROVIDER_SITE_OTHER): Payer: PPO | Admitting: Family Medicine

## 2014-08-08 ENCOUNTER — Ambulatory Visit
Admission: RE | Admit: 2014-08-08 | Discharge: 2014-08-08 | Disposition: A | Discharge status: Home / Self Care | Payer: PPO | Source: Ambulatory Visit | Attending: Family Medicine | Admitting: Family Medicine

## 2014-08-08 ENCOUNTER — Encounter: Payer: Self-pay | Admitting: Family Medicine

## 2014-08-08 VITALS — BP 154/83 | Ht 68.5 in | Wt 285.0 lb

## 2014-08-08 DIAGNOSIS — M25532 Pain in left wrist: Secondary | ICD-10-CM

## 2014-08-08 NOTE — Progress Notes (Signed)
   Subjective:    Patient ID: Maria [name], female    DOB: 1960/06/26, 54 y.o.   MRN: [id number]  HPI Left wrist pain. April 27 she was supposed off of a barstool, landed with left arm outstretched. Had pretty immediate pain in her left wrist and some swelling the next 2 days. Continues to have pain today. Has had no hand numbness.   Review of Systems Has had some swelling in the wrist but no erythema. No numbness or tingling in the hand.    Objective:   Physical Exam Vital signs are reviewe GEN.: Well-developed female no acute distress in  WRIST: Left. Swollen dorsally and tender to palpation across the distal ulna. She has intact flexion extension at the wrist. She has intact grip strength. VASCULAR: Radial pulses 2+ bilaterally equal. Normal capillary refill bilateral hands.       Assessment & Plan:  Left wrist pain after a fall. She's been using an over-the-counter wrap which gives her some pain relief. I think this is fine to continue to use in out also have her ice and elevate. We'll get some x-rays today to make sure there is no fracture at this site.

## 2014-08-12 ENCOUNTER — Encounter: Payer: Self-pay | Admitting: Family Medicine

## 2014-08-12 ENCOUNTER — Other Ambulatory Visit: Payer: Self-pay | Admitting: *Deleted

## 2014-08-12 MED ORDER — CARISOPRODOL 350 MG PO TABS: 350.0000 mg | ORAL_TABLET | Freq: Three times a day (TID) | ORAL | Status: DC

## 2014-08-12 NOTE — Telephone Encounter (Signed)
[place] Ok to call in TID with 3 refills as below Make sure pharmacy discontinues any PRIOR rx refills she has remaining on previous Rx THANKS! Dorcas Mcmurray

## 2014-08-22 ENCOUNTER — Other Ambulatory Visit: Payer: Self-pay | Admitting: Family Medicine

## 2014-08-22 ENCOUNTER — Encounter: Payer: Self-pay | Admitting: Family Medicine

## 2014-08-22 ENCOUNTER — Ambulatory Visit (INDEPENDENT_AMBULATORY_CARE_PROVIDER_SITE_OTHER): Payer: PPO | Admitting: Family Medicine

## 2014-08-22 DIAGNOSIS — M1612 Unilateral primary osteoarthritis, left hip: Secondary | ICD-10-CM

## 2014-08-22 DIAGNOSIS — M7062 Trochanteric bursitis, left hip: Secondary | ICD-10-CM

## 2014-08-22 NOTE — Progress Notes (Signed)
   Subjective:    Patient ID: Maria [name], female    DOB: Jun 19, 1960, 54 y.o.   MRN: [id number]  HPI Maria [name] is a 54 year old female who presents with left hip pain. Onset was several months ago. She is previously diagnosed with left greater trochanteric bursitis and has undergone 2 cortisone injections. She says that these provided only mild temporary relief. Location of pain is primarily in the deep left gluteal region. She notes an associated clicking in the deep left hip. She denies any acute injury. She denies any fevers or chills. She denies any numbness, tingling, or radicular symptoms. History complicated by morbid obesity, though she has successfully intentionally lost 90 lbs over the past year.  Past medical history, social history, medications, and allergies were reviewed and are up to date in the chart.  Review of Systems 7 point review of systems was performed and was otherwise negative unless noted in the history of present illness.     Objective:   Physical Exam BP 137/76 mmHg  Ht 5' 8.5" (1.74 m)  Wt 285 lb (129.275 kg)  BMI 42.70 kg/m2  LMP 04/11/2009 GEN: The patient is well-developed well-nourished female and in no acute distress.  She is awake alert and oriented x3. SKIN: warm and well-perfused, no rash  EXTR: No lower extremity edema or calf tenderness Neuro: Strength 5/5 globally. Sensation intact throughout. DTRs 2/4 bilaterally. No focal deficits. Vasc: +2 bilateral distal pulses. No edema.  MSK: Examination of the left hip reveals fairly good internal and external rotation with internal rotation to approximately 30. This also produces a palpable click located in the deep groin. She does not have tenderness over the greater trochanter or its bursa. Negative seated straight leg raise. Positive Faber for deep groin pain.  X-rays Left Hip: (04/2011): Bilateral femoral acetabular mild to moderate degenerative changes with osteophyte formation at the acetabular rim.  Otherwise fairly unremarkable.    Assessment & Plan:  Please see problem based assessment and plan in the problem list.

## 2014-08-22 NOTE — Assessment & Plan Note (Signed)
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 -At this point I think her pain may be from an intra-articular source at the left hip. Certainly I think that a greater trochanteric bursitis or snapping hip syndrome could potentially be in the differential, however think that she would've responded better to the 2 cortisone injections at the greater trochanter. Her pain does not seem to be originating from her lumbar spine. Based on the radiographic results, I think that a diagnostic and therapeutic left hip injection may benefit her. -I attempted to visualize the left hip joints under ultrasound in the office today. I was able to see the femoral acetabular space anteriorly, however the depth of penetration somewhat limited the exam and certainly would make an intra-articular hip injection here in the office somewhat of a difficult endeavor with potential for complications. -Based on this, we have referred the patient to [facility] to undergo a fluoroscopic-guided left hip injection. -She will follow-up in 4-6 weeks after the injection or sooner if needed.

## 2014-08-22 NOTE — Patient Instructions (Addendum)
Left Hip Intra-articular Injection  Imaging Monday 08/25/14 at 1015am [address] [address] [phone] OR [phone] If you need to call or cancel for any reason  NO FOOD 4 HOURS PRIOR TO YOUR APPT.....YOU CAN DRINK LIQUIDS

## 2014-08-25 ENCOUNTER — Other Ambulatory Visit: Payer: PPO

## 2014-08-28 ENCOUNTER — Ambulatory Visit
Admission: RE | Admit: 2014-08-28 | Discharge: 2014-08-28 | Disposition: A | Discharge status: Home / Self Care | Payer: PPO | Source: Ambulatory Visit | Attending: Family Medicine | Admitting: Family Medicine

## 2014-08-28 DIAGNOSIS — M1612 Unilateral primary osteoarthritis, left hip: Secondary | ICD-10-CM

## 2014-08-28 DIAGNOSIS — M7062 Trochanteric bursitis, left hip: Secondary | ICD-10-CM

## 2014-08-28 MED ORDER — METHYLPREDNISOLONE ACETATE 40 MG/ML INJ SUSP (RADIOLOG
120.0000 mg | Freq: Once | INTRAMUSCULAR | Status: AC
  Administered 2014-08-28: 120 mg via INTRA_ARTICULAR

## 2014-08-28 MED ORDER — IOHEXOL 180 MG/ML  SOLN
1.0000 mL | Freq: Once | INTRAMUSCULAR | Status: AC | PRN
  Administered 2014-08-28: 1 mL via INTRA_ARTICULAR

## 2014-08-29 ENCOUNTER — Ambulatory Visit: Payer: PPO | Admitting: Family Medicine

## 2014-10-02 ENCOUNTER — Other Ambulatory Visit: Payer: Self-pay | Admitting: Obstetrics & Gynecology

## 2014-11-06 ENCOUNTER — Emergency Department (HOSPITAL_BASED_OUTPATIENT_CLINIC_OR_DEPARTMENT_OTHER)
Admission: EM | Admit: 2014-11-06 | Discharge: 2014-11-06 | Disposition: A | Discharge status: Home / Self Care | Payer: PPO | Attending: Emergency Medicine | Admitting: Emergency Medicine

## 2014-11-06 ENCOUNTER — Encounter (HOSPITAL_BASED_OUTPATIENT_CLINIC_OR_DEPARTMENT_OTHER): Payer: Self-pay

## 2014-11-06 DIAGNOSIS — Z794 Long term (current) use of insulin: Secondary | ICD-10-CM | POA: Insufficient documentation

## 2014-11-06 DIAGNOSIS — Z8669 Personal history of other diseases of the nervous system and sense organs: Secondary | ICD-10-CM | POA: Diagnosis not present

## 2014-11-06 DIAGNOSIS — E119 Type 2 diabetes mellitus without complications: Secondary | ICD-10-CM | POA: Diagnosis not present

## 2014-11-06 DIAGNOSIS — Z8739 Personal history of other diseases of the musculoskeletal system and connective tissue: Secondary | ICD-10-CM | POA: Insufficient documentation

## 2014-11-06 DIAGNOSIS — M1990 Unspecified osteoarthritis, unspecified site: Secondary | ICD-10-CM | POA: Insufficient documentation

## 2014-11-06 DIAGNOSIS — F419 Anxiety disorder, unspecified: Secondary | ICD-10-CM | POA: Insufficient documentation

## 2014-11-06 DIAGNOSIS — Z8742 Personal history of other diseases of the female genital tract: Secondary | ICD-10-CM | POA: Diagnosis not present

## 2014-11-06 DIAGNOSIS — Z79899 Other long term (current) drug therapy: Secondary | ICD-10-CM | POA: Insufficient documentation

## 2014-11-06 DIAGNOSIS — L02214 Cutaneous abscess of groin: Secondary | ICD-10-CM

## 2014-11-06 DIAGNOSIS — K219 Gastro-esophageal reflux disease without esophagitis: Secondary | ICD-10-CM | POA: Diagnosis not present

## 2014-11-06 DIAGNOSIS — Z792 Long term (current) use of antibiotics: Secondary | ICD-10-CM | POA: Insufficient documentation

## 2014-11-06 DIAGNOSIS — Z87442 Personal history of urinary calculi: Secondary | ICD-10-CM | POA: Diagnosis not present

## 2014-11-06 DIAGNOSIS — Z87440 Personal history of urinary (tract) infections: Secondary | ICD-10-CM | POA: Diagnosis not present

## 2014-11-06 DIAGNOSIS — I10 Essential (primary) hypertension: Secondary | ICD-10-CM | POA: Insufficient documentation

## 2014-11-06 DIAGNOSIS — F329 Major depressive disorder, single episode, unspecified: Secondary | ICD-10-CM | POA: Diagnosis not present

## 2014-11-06 MED ORDER — LIDOCAINE HCL (PF) 1 % IJ SOLN
INTRAMUSCULAR | Status: AC
  Administered 2014-11-06: 5 mL
  Filled 2014-11-06: qty 5

## 2014-11-06 MED ORDER — SULFAMETHOXAZOLE-TRIMETHOPRIM 800-160 MG PO TABS: 1.0000 | ORAL_TABLET | Freq: Two times a day (BID) | ORAL | Status: AC

## 2014-11-06 MED ORDER — OXYCODONE-ACETAMINOPHEN 5-325 MG PO TABS
2.0000 | ORAL_TABLET | Freq: Once | ORAL | Status: AC
  Administered 2014-11-06: 2 via ORAL
  Filled 2014-11-06: qty 2

## 2014-11-06 MED ORDER — OXYCODONE-ACETAMINOPHEN 5-325 MG PO TABS: 1.0000 | ORAL_TABLET | Freq: Four times a day (QID) | ORAL | Status: DC | PRN

## 2014-11-06 MED ORDER — CEPHALEXIN 500 MG PO CAPS: 500.0000 mg | ORAL_CAPSULE | Freq: Four times a day (QID) | ORAL | Status: DC

## 2014-11-06 NOTE — ED Provider Notes (Signed)
CSN: [id number]     Arrival date & time 11/06/14  0456 History   First MD Initiated Contact with Patient 11/06/14 0522     Chief Complaint  Patient presents with  . Abscess     (Consider location/radiation/quality/duration/timing/severity/associated sxs/prior Treatment) HPI Comments: Patient is a 54 year old female with history of morbid obesity and diabetes. She presents for evaluation of pain and swelling to the left groin. She has had an abscess in the past on her pannus that required surgery.  Patient is a 54 y.o. female presenting with abscess. The history is provided by the patient.  Abscess Abscess location: Left groin. Abscess quality: fluctuance, induration, painful and warmth   Abscess quality: not draining   Red streaking: no   Duration:  2 days Progression:  Worsening Pain details:    Quality:  Throbbing   Severity:  Moderate   Timing:  Constant   Progression:  Worsening   Past Medical History  Diagnosis Date  . Hypertension   . Diabetes mellitus   . Gout   . Nephrolithiasis     recurrent  . Osteoarthritis, knee   . GERD (gastroesophageal reflux disease)   . Depression   . Rotator cuff syndrome of left shoulder   . Carpal tunnel syndrome, left   . Anxiety   . DUB (dysfunctional uterine bleeding)   . Allergic rhinitis   . External hemorrhoid   . Morbid obesity   . Recurrent UTI   . Obesity    Past Surgical History  Procedure Laterality Date  . Lithotripsy    . Replacement total knee bilateral    . Cholecystectomy    . Tubal ligation    . Incision and drainage abscess N/A 06/01/2014    Procedure: INCISION AND DRAINAGE ABSCESS;  Surgeon: Rolm Bookbinder, MD;  Location: [facility] OR;  Service: General;  Laterality: N/A;   Family History  Problem Relation Age of Onset  . Coronary artery disease    . Hypertension    . Diabetes    . Obesity    . Diabetes Mother   . Hypertension Mother    History  Substance Use Topics  . Smoking status: Never Smoker    . Smokeless tobacco: Never Used  . Alcohol Use: No   OB History    Gravida Para Term Preterm AB TAB SAB Ectopic Multiple Living   4         3      Obstetric Comments   Patient had two twin pregnancies.  One Pregnancy resulted in miscarriage completely and second twin pregnancy resulted in one live birth.  She also had a son that was killed in the [employer]     Review of Systems  All other systems reviewed and are negative.     Allergies  Propoxyphene n-acetaminophen  Home Medications   Prior to Admission medications   Medication Sig Start Date End Date Taking? Authorizing Provider  ALPRAZolam Duanne Moron) 1 MG tablet Take 1 mg by mouth 3 (three) times daily as needed for anxiety.  10/18/11   Historical Provider, MD  benazepril (LOTENSIN) 40 MG tablet Take 40 mg by mouth daily. 04/19/14   Historical Provider, MD  carisoprodol (SOMA) 350 MG tablet Take 1 tablet (350 mg total) by mouth 3 (three) times daily. Take one tablet by mouth twice a day as needed for pain and spasm 08/12/14   Dickie La, MD  cephALEXin (KEFLEX) 500 MG capsule Take 500 mg by mouth 2 (two) times daily. 05/26/14  Historical Provider, MD  cetirizine (ZYRTEC) 10 MG tablet Take 10 mg by mouth daily. 05/23/14   Historical Provider, MD  colchicine (COLCRYS) 0.6 MG tablet Take 1 tablet (0.6 mg total) by mouth daily. 01/06/14   Dickie La, MD  doxycycline (VIBRAMYCIN) 50 MG capsule Take 2 capsules (100 mg total) by mouth 2 (two) times daily. 06/02/14   Shanker Kristeen Mans, MD  erythromycin ethylsuccinate (EES) 400 MG tablet  05/30/14   Historical Provider, MD  fluconazole (DIFLUCAN) 150 MG tablet  06/12/14   Historical Provider, MD  fluconazole (DIFLUCAN) 150 MG tablet Take 1 tablet (150 mg total) by mouth once. Take the second tablet 3 days later. 08/06/14   Emily Filbert, MD  glipiZIDE (GLUCOTROL XL) 5 MG 24 hr tablet  07/24/14   Historical Provider, MD  glipiZIDE (GLUCOTROL) 5 MG tablet Take 5 mg by mouth 2 (two) times daily.       Historical Provider, MD  hydrochlorothiazide (HYDRODIURIL) 25 MG tablet Take 25 mg by mouth daily.    Historical Provider, MD  HYDROcodone-acetaminophen (NORCO/VICODIN) 5-325 MG per tablet Take 1 tablet by mouth every 4 (four) hours as needed. 07/01/14   Historical Provider, MD  insulin detemir (LEVEMIR) 100 UNIT/ML injection Inject 60 Units into the skin at bedtime.    Historical Provider, MD  Insulin Pen Needle (B-D ULTRAFINE III SHORT PEN) 31G X 8 MM MISC Use as directed once daily with Lantus solostar pen 06/02/10   Nneka Isamah  LEVEMIR FLEXTOUCH 100 UNIT/ML Pen  05/08/14   Historical Provider, MD  lidocaine (XYLOCAINE) 5 % ointment  05/29/14   Historical Provider, MD  metFORMIN (GLUCOPHAGE) 1000 MG tablet Take 1,000 mg by mouth 2 (two) times daily with a meal.     Historical Provider, MD  metoprolol (TOPROL-XL) 200 MG 24 hr tablet Take 200 mg by mouth daily.     Historical Provider, MD  metroNIDAZOLE (FLAGYL) 500 MG tablet Take 1 tablet (500 mg total) by mouth 2 (two) times daily. 08/07/14   Emily Filbert, MD  omeprazole (PRILOSEC) 20 MG capsule Take 1 capsule (20 mg total) by mouth 2 (two) times daily. Patient not taking: Reported on 05/31/2014 05/12/13   [name], MD  ondansetron [facility] ODT) 4 MG disintegrating tablet 4mg  ODT q4 hours prn nausea/vomit Patient not taking: Reported on 05/31/2014 04/20/14   Elnora Morrison, MD  oxyCODONE (ROXICODONE) 15 MG immediate release tablet  06/24/14   Historical Provider, MD  Oxycodone HCl 10 MG TABS Take 1.5-2 tablets (15-20 mg total) by mouth every 6 (six) hours as needed. 06/02/14   Shanker Kristeen Mans, MD  oxyCODONE-acetaminophen (PERCOCET) 10-325 MG per tablet Take 1 tablet by mouth every 6 (six) hours as needed. for pain 04/24/14   Historical Provider, MD  oxyCODONE-acetaminophen (PERCOCET/ROXICET) 5-325 MG per tablet Take by mouth every 4 (four) hours as needed. for pain 04/20/14   Historical Provider, MD  pantoprazole (PROTONIX) 40 MG tablet Take 40 mg by mouth  daily.     Historical Provider, MD  promethazine (PHENERGAN) 25 MG tablet Take 1 tablet (25 mg total) by mouth every 6 (six) hours as needed for nausea or vomiting. 10/08/13   Fredia Sorrow, MD  sulfamethoxazole-trimethoprim (BACTRIM DS,SEPTRA DS) 800-160 MG per tablet  05/29/14   Historical Provider, MD  tamsulosin (FLOMAX) 0.4 MG CAPS capsule  04/24/14   Historical Provider, MD   BP 148/73 mmHg  Pulse 88  Temp(Src) 98.4 F (36.9 C) (Oral)  Resp 18  Ht  5\' 8"  (1.727 m)  Wt 273 lb (123.832 kg)  BMI 41.52 kg/m2  SpO2 96%  LMP 04/11/2009 Physical Exam  Constitutional: She is oriented to person, place, and time. She appears well-developed and well-nourished. No distress.  HENT:  Head: Normocephalic and atraumatic.  Neck: Normal range of motion. Neck supple.  Musculoskeletal: Normal range of motion.  Neurological: She is alert and oriented to person, place, and time.  Skin: Skin is warm and dry. She is not diaphoretic.  There is a 3 cm x 5 cm swollen area to the left groin. There is fluctuance and tenderness to palpation.  Nursing note and vitals reviewed.   ED Course  Procedures (including critical care time) Labs Review Labs Reviewed - No data to display  Imaging Review No results found.   EKG Interpretation None     INCISION AND DRAINAGE Performed by: Veryl Speak Consent: Verbal consent obtained. Risks and benefits: risks, benefits and alternatives were discussed Type: abscess  Body area: Left groin  Anesthesia: local infiltration  Incision was made with a scalpel.  Local anesthetic: lidocaine 1 % without epinephrine  Anesthetic total: 2 ml  Complexity: complex Blunt dissection to break up loculations  Drainage: purulent  Drainage amount: Moderate   Packing material: No packing placed   Patient tolerance: Patient tolerated the procedure well with no immediate complications.     MDM   Final diagnoses:  None    We'll treat with Keflex, Bactrim,  warm soaks, pain medication, and when necessary return.    Veryl Speak, MD 11/06/14 [phone]

## 2014-11-06 NOTE — ED Notes (Signed)
Pt c/o abscess to lt upper thigh/groin area x2days, no drainage, redness and tender

## 2014-11-06 NOTE — Discharge Instructions (Signed)
Keflex and Bactrim as prescribed.  Percocet as prescribed as needed for pain.  Perform warm soaks as frequently as possible for the next 3 days.  Return to the emergency department if symptoms significantly worsen or change.   Abscess An abscess is an infected area that contains a collection of pus and debris.It can occur in almost any part of the body. An abscess is also known as a furuncle or boil. CAUSES  An abscess occurs when tissue gets infected. This can occur from blockage of oil or sweat glands, infection of hair follicles, or a minor injury to the skin. As the body tries to fight the infection, pus collects in the area and creates pressure under the skin. This pressure causes pain. People with weakened immune systems have difficulty fighting infections and get certain abscesses more often.  SYMPTOMS Usually an abscess develops on the skin and becomes a painful mass that is red, warm, and tender. If the abscess forms under the skin, you may feel a moveable soft area under the skin. Some abscesses break open (rupture) on their own, but most will continue to get worse without care. The infection can spread deeper into the body and eventually into the bloodstream, causing you to feel ill.  DIAGNOSIS  Your caregiver will take your medical history and perform a physical exam. A sample of fluid may also be taken from the abscess to determine what is causing your infection. TREATMENT  Your caregiver may prescribe antibiotic medicines to fight the infection. However, taking antibiotics alone usually does not cure an abscess. Your caregiver may need to make a small cut (incision) in the abscess to drain the pus. In some cases, gauze is packed into the abscess to reduce pain and to continue draining the area. HOME CARE INSTRUCTIONS   Only take over-the-counter or prescription medicines for pain, discomfort, or fever as directed by your caregiver.  If you were prescribed antibiotics, take them  as directed. Finish them even if you start to feel better.  If gauze is used, follow your caregiver's directions for changing the gauze.  To avoid spreading the infection:  Keep your draining abscess covered with a bandage.  Wash your hands well.  Do not share personal care items, towels, or whirlpools with others.  Avoid skin contact with others.  Keep your skin and clothes clean around the abscess.  Keep all follow-up appointments as directed by your caregiver. SEEK MEDICAL CARE IF:   You have increased pain, swelling, redness, fluid drainage, or bleeding.  You have muscle aches, chills, or a general ill feeling.  You have a fever. MAKE SURE YOU:   Understand these instructions.  Will watch your condition.  Will get help right away if you are not doing well or get worse. Document Released: 01/05/2005 Document Revised: 09/27/2011 Document Reviewed: 06/10/2011 [facility] Patient Information 2015 [place], [place]. This information is not intended to replace advice given to you by your health care provider. Make sure you discuss any questions you have with your health care provider.

## 2014-11-18 ENCOUNTER — Telehealth: Payer: Self-pay | Admitting: *Deleted

## 2014-11-18 DIAGNOSIS — B3731 Acute candidiasis of vulva and vagina: Secondary | ICD-10-CM

## 2014-11-18 DIAGNOSIS — B373 Candidiasis of vulva and vagina: Secondary | ICD-10-CM

## 2014-11-18 MED ORDER — FLUCONAZOLE 200 MG PO TABS: ORAL_TABLET | ORAL | Status: DC

## 2014-11-18 NOTE — Telephone Encounter (Signed)
Pt has diabetes and had a boil that had been drained a few days ago and was placed on oral antibiotic therapy, pt now c/o thick white vaginal discharge associated with yeast infection, requesting Diflucan to be called in.  Pt had Diflucan 200mg  called in to pharmacy in the past and stated that is worked well for her the last time she used it. Spoke to Dr Hulan Fray, ordered Diflucan 200mg  with 2 tabs to be called to pharmacy. Informed pt of rx and instructions.

## 2014-11-28 ENCOUNTER — Telehealth: Payer: Self-pay | Admitting: *Deleted

## 2014-11-28 ENCOUNTER — Encounter: Payer: Self-pay | Admitting: *Deleted

## 2014-11-28 NOTE — Telephone Encounter (Signed)
Letter done and faxed

## 2014-12-03 ENCOUNTER — Other Ambulatory Visit: Payer: Self-pay | Admitting: Family Medicine

## 2014-12-04 ENCOUNTER — Telehealth: Payer: Self-pay | Admitting: *Deleted

## 2014-12-04 NOTE — Telephone Encounter (Signed)
Is this ok to refill?  

## 2014-12-04 NOTE — Telephone Encounter (Signed)
Rhea Ok to fill once [facility]! Dorcas Mcmurray

## 2014-12-04 NOTE — Telephone Encounter (Signed)
-----   Message from Maria [name] sent at 12/04/2014 10:15 AM EDT ----- Regarding: refill request Pt called requesting refill on Soma 350mg  [facility] in chart

## 2014-12-05 ENCOUNTER — Other Ambulatory Visit: Payer: Self-pay | Admitting: *Deleted

## 2014-12-05 MED ORDER — CARISOPRODOL 350 MG PO TABS: 350.0000 mg | ORAL_TABLET | Freq: Three times a day (TID) | ORAL | Status: DC

## 2014-12-18 ENCOUNTER — Telehealth: Payer: Self-pay | Admitting: Obstetrics & Gynecology

## 2014-12-18 DIAGNOSIS — B373 Candidiasis of vulva and vagina: Secondary | ICD-10-CM

## 2014-12-18 DIAGNOSIS — B3731 Acute candidiasis of vulva and vagina: Secondary | ICD-10-CM

## 2014-12-18 MED ORDER — FLUCONAZOLE 200 MG PO TABS: ORAL_TABLET | ORAL | Status: DC

## 2014-12-18 NOTE — Telephone Encounter (Signed)
Pt states that she's been taking antibiotics for a lanced boil and she's developed a yeast infection. Was wondering if something could be called in for her. Pt uses the [facility] in [place]. Please advise.

## 2014-12-18 NOTE — Telephone Encounter (Signed)
Discussed case with Dr Hulan Fray, sent rx for Diflucan 200mg  to pharmacy, notified pt.

## 2014-12-19 ENCOUNTER — Encounter: Payer: Self-pay | Admitting: Family Medicine

## 2014-12-19 ENCOUNTER — Ambulatory Visit (INDEPENDENT_AMBULATORY_CARE_PROVIDER_SITE_OTHER): Payer: PPO | Admitting: Family Medicine

## 2014-12-19 VITALS — BP 157/81 | HR 77 | Ht 68.5 in | Wt 279.0 lb

## 2014-12-19 DIAGNOSIS — M25512 Pain in left shoulder: Secondary | ICD-10-CM | POA: Diagnosis not present

## 2014-12-19 MED ORDER — METHYLPREDNISOLONE ACETATE 40 MG/ML IJ SUSP
40.0000 mg | Freq: Once | INTRAMUSCULAR | Status: AC
  Administered 2014-12-19: 40 mg via INTRA_ARTICULAR

## 2014-12-19 NOTE — Progress Notes (Signed)
   Subjective:    Patient ID: Maria [name], female    DOB: 04/23/1960, 54 y.o.   MRN: [id number]  HPI Left shoulder pain, particularly noticeable at night when she tries to lie on that side. She is right-hand dominant. Has had problems with this shoulder in the past and several years ago had cortical Sterritt injection which was helpful. Pain with lying on that side and with elevation above shoulder height. Aching in nature. No numbness or tingling in her hand.   Review of Systems No unusual weight change, fever, sweats, chills.    Objective:   Physical Exam  Vital signs are reviewed GEN.: Well-developed obese female no acute distress in SHOULDER: Bilaterally symmetrical area left shoulder intact strength in all ranges of the rotator cuff. She has pain above 90 of abduction with positive impingement signs.  INJECTION: Patient was given informed consent, signed copy in the chart. Appropriate time out was taken. Area prepped and draped in usual sterile fashion. 1 cc of methylprednisolone 40 mg/ml plus  4 cc of 1% lidocaine without epinephrine was injected into the left subacromial bursa using a(n) posterior approach. The patient tolerated the procedure well. There were no complications. Post procedure instructions were given.       Assessment & Plan:  Recurrence of left subacromial bursitis. Will try cortical Sterritt injection today. She is reminded to do the exercises I had given her before. She says she knows where her handout for the exercises is at home.

## 2015-01-06 ENCOUNTER — Telehealth: Payer: Self-pay | Admitting: *Deleted

## 2015-01-06 NOTE — Telephone Encounter (Signed)
Will provide pt with those list of meds for her PCP if needed

## 2015-01-07 NOTE — Telephone Encounter (Signed)
Pt called back stating she now has new insurance and will contact [facility] with the new card so they can resubmit Soma. Informed pt if she does that and there are no insurance blocks, we will be glad to refill; any requirements of prior authorization will have to come from her PCP. Pt voiced understanding

## 2015-04-14 ENCOUNTER — Other Ambulatory Visit: Payer: Self-pay | Admitting: Family Medicine

## 2015-04-14 DIAGNOSIS — N200 Calculus of kidney: Secondary | ICD-10-CM | POA: Diagnosis not present

## 2015-04-14 DIAGNOSIS — R109 Unspecified abdominal pain: Secondary | ICD-10-CM | POA: Diagnosis not present

## 2015-04-14 DIAGNOSIS — R1011 Right upper quadrant pain: Secondary | ICD-10-CM | POA: Diagnosis not present

## 2015-04-29 ENCOUNTER — Telehealth: Payer: Self-pay | Admitting: *Deleted

## 2015-04-29 MED ORDER — INDOMETHACIN 50 MG PO CAPS: ORAL_CAPSULE | ORAL | Status: DC

## 2015-04-29 NOTE — Telephone Encounter (Signed)
Refills on gout medicine

## 2015-04-30 ENCOUNTER — Telehealth: Payer: Self-pay | Admitting: *Deleted

## 2015-05-01 ENCOUNTER — Other Ambulatory Visit: Payer: Self-pay | Admitting: Family Medicine

## 2015-05-01 ENCOUNTER — Other Ambulatory Visit: Payer: Self-pay | Admitting: *Deleted

## 2015-05-01 MED ORDER — COLCHICINE 0.6 MG PO TABS: 0.6000 mg | ORAL_TABLET | Freq: Every day | ORAL | Status: DC

## 2015-05-01 NOTE — Telephone Encounter (Signed)
refilll colcrys

## 2015-05-04 NOTE — Telephone Encounter (Signed)
I have refilled her colcrys for her gout

## 2015-05-18 ENCOUNTER — Encounter (HOSPITAL_BASED_OUTPATIENT_CLINIC_OR_DEPARTMENT_OTHER): Payer: Self-pay | Admitting: *Deleted

## 2015-05-18 ENCOUNTER — Emergency Department (HOSPITAL_BASED_OUTPATIENT_CLINIC_OR_DEPARTMENT_OTHER): Payer: PPO

## 2015-05-18 ENCOUNTER — Emergency Department (HOSPITAL_BASED_OUTPATIENT_CLINIC_OR_DEPARTMENT_OTHER)
Admission: EM | Admit: 2015-05-18 | Discharge: 2015-05-18 | Disposition: A | Discharge status: Home / Self Care | Payer: PPO | Attending: Emergency Medicine | Admitting: Emergency Medicine

## 2015-05-18 DIAGNOSIS — F329 Major depressive disorder, single episode, unspecified: Secondary | ICD-10-CM | POA: Insufficient documentation

## 2015-05-18 DIAGNOSIS — K219 Gastro-esophageal reflux disease without esophagitis: Secondary | ICD-10-CM | POA: Insufficient documentation

## 2015-05-18 DIAGNOSIS — Z9851 Tubal ligation status: Secondary | ICD-10-CM | POA: Diagnosis not present

## 2015-05-18 DIAGNOSIS — Z794 Long term (current) use of insulin: Secondary | ICD-10-CM | POA: Insufficient documentation

## 2015-05-18 DIAGNOSIS — E119 Type 2 diabetes mellitus without complications: Secondary | ICD-10-CM | POA: Insufficient documentation

## 2015-05-18 DIAGNOSIS — Z87440 Personal history of urinary (tract) infections: Secondary | ICD-10-CM | POA: Diagnosis not present

## 2015-05-18 DIAGNOSIS — R109 Unspecified abdominal pain: Secondary | ICD-10-CM | POA: Diagnosis not present

## 2015-05-18 DIAGNOSIS — M179 Osteoarthritis of knee, unspecified: Secondary | ICD-10-CM | POA: Insufficient documentation

## 2015-05-18 DIAGNOSIS — N201 Calculus of ureter: Secondary | ICD-10-CM | POA: Diagnosis not present

## 2015-05-18 DIAGNOSIS — Z8669 Personal history of other diseases of the nervous system and sense organs: Secondary | ICD-10-CM | POA: Diagnosis not present

## 2015-05-18 DIAGNOSIS — Z9889 Other specified postprocedural states: Secondary | ICD-10-CM | POA: Diagnosis not present

## 2015-05-18 DIAGNOSIS — M109 Gout, unspecified: Secondary | ICD-10-CM | POA: Insufficient documentation

## 2015-05-18 DIAGNOSIS — I10 Essential (primary) hypertension: Secondary | ICD-10-CM | POA: Diagnosis not present

## 2015-05-18 DIAGNOSIS — Z8742 Personal history of other diseases of the female genital tract: Secondary | ICD-10-CM | POA: Insufficient documentation

## 2015-05-18 DIAGNOSIS — Z9049 Acquired absence of other specified parts of digestive tract: Secondary | ICD-10-CM | POA: Diagnosis not present

## 2015-05-18 DIAGNOSIS — Z79899 Other long term (current) drug therapy: Secondary | ICD-10-CM | POA: Insufficient documentation

## 2015-05-18 DIAGNOSIS — F419 Anxiety disorder, unspecified: Secondary | ICD-10-CM | POA: Insufficient documentation

## 2015-05-18 DIAGNOSIS — Z791 Long term (current) use of non-steroidal anti-inflammatories (NSAID): Secondary | ICD-10-CM | POA: Diagnosis not present

## 2015-05-18 DIAGNOSIS — Z7984 Long term (current) use of oral hypoglycemic drugs: Secondary | ICD-10-CM | POA: Diagnosis not present

## 2015-05-18 DIAGNOSIS — R52 Pain, unspecified: Secondary | ICD-10-CM

## 2015-05-18 DIAGNOSIS — N200 Calculus of kidney: Secondary | ICD-10-CM | POA: Diagnosis not present

## 2015-05-18 DIAGNOSIS — R112 Nausea with vomiting, unspecified: Secondary | ICD-10-CM | POA: Diagnosis not present

## 2015-05-18 DIAGNOSIS — N132 Hydronephrosis with renal and ureteral calculous obstruction: Secondary | ICD-10-CM | POA: Diagnosis not present

## 2015-05-18 LAB — CBC WITH DIFFERENTIAL/PLATELET
Basophils Absolute: 0 10*3/uL (ref 0.0–0.1)
Basophils Relative: 0 %
Eosinophils Absolute: 0.1 10*3/uL (ref 0.0–0.7)
Eosinophils Relative: 1 %
HCT: 38.4 % (ref 36.0–46.0)
Hemoglobin: 11.9 g/dL — ABNORMAL LOW (ref 12.0–15.0)
Lymphocytes Relative: 50 %
Lymphs Abs: 3.7 10*3/uL (ref 0.7–4.0)
MCH: 27.5 pg (ref 26.0–34.0)
MCHC: 31 g/dL (ref 30.0–36.0)
MCV: 88.9 fL (ref 78.0–100.0)
Monocytes Absolute: 0.4 10*3/uL (ref 0.1–1.0)
Monocytes Relative: 6 %
Neutro Abs: 3.1 10*3/uL (ref 1.7–7.7)
Neutrophils Relative %: 43 %
Platelets: 290 10*3/uL (ref 150–400)
RBC: 4.32 MIL/uL (ref 3.87–5.11)
RDW: 13.8 % (ref 11.5–15.5)
WBC: 7.4 10*3/uL (ref 4.0–10.5)

## 2015-05-18 LAB — URINALYSIS, ROUTINE W REFLEX MICROSCOPIC
Bilirubin Urine: NEGATIVE
Glucose, UA: NEGATIVE mg/dL
Ketones, ur: NEGATIVE mg/dL
Leukocytes, UA: NEGATIVE
Nitrite: NEGATIVE
Protein, ur: NEGATIVE mg/dL
Specific Gravity, Urine: 1.026 (ref 1.005–1.030)
pH: 6.5 (ref 5.0–8.0)

## 2015-05-18 LAB — CBG MONITORING, ED
Glucose-Capillary: 375 mg/dL — ABNORMAL HIGH (ref 65–99)
Glucose-Capillary: 346 mg/dL — ABNORMAL HIGH (ref 65–99)

## 2015-05-18 LAB — BASIC METABOLIC PANEL
Anion gap: 9 (ref 5–15)
BUN: 25 mg/dL — ABNORMAL HIGH (ref 6–20)
CO2: 28 mmol/L (ref 22–32)
Calcium: 9.1 mg/dL (ref 8.9–10.3)
Chloride: 102 mmol/L (ref 101–111)
Creatinine, Ser: 1.11 mg/dL — ABNORMAL HIGH (ref 0.44–1.00)
GFR calc Af Amer: >60 mL/min (ref >60)
GFR calc non Af Amer: 55 mL/min — ABNORMAL LOW (ref >60)
Glucose, Bld: 427 mg/dL — ABNORMAL HIGH (ref 65–99)
Potassium: 4.4 mmol/L (ref 3.5–5.1)
Sodium: 139 mmol/L (ref 135–145)

## 2015-05-18 LAB — URINE MICROSCOPIC-ADD ON

## 2015-05-18 MED ORDER — FENTANYL CITRATE (PF) 100 MCG/2ML IJ SOLN
100.0000 ug | Freq: Once | INTRAMUSCULAR | Status: AC
  Administered 2015-05-18: 100 ug via INTRAVENOUS
  Filled 2015-05-18: qty 2

## 2015-05-18 MED ORDER — ONDANSETRON HCL 4 MG/2ML IJ SOLN
4.0000 mg | Freq: Once | INTRAMUSCULAR | Status: AC
  Administered 2015-05-18: 4 mg via INTRAVENOUS
  Filled 2015-05-18: qty 2

## 2015-05-18 MED ORDER — MORPHINE SULFATE (PF) 4 MG/ML IV SOLN
4.0000 mg | Freq: Once | INTRAVENOUS | Status: AC
  Administered 2015-05-18: 4 mg via INTRAVENOUS
  Filled 2015-05-18: qty 1

## 2015-05-18 MED ORDER — INSULIN REGULAR HUMAN 100 UNIT/ML IJ SOLN
3.0000 [IU] | Freq: Once | INTRAMUSCULAR | Status: AC
  Administered 2015-05-18: 3 [IU] via SUBCUTANEOUS
  Filled 2015-05-18: qty 1

## 2015-05-18 MED ORDER — TAMSULOSIN HCL 0.4 MG PO CAPS: 0.4000 mg | ORAL_CAPSULE | Freq: Every day | ORAL | Status: DC

## 2015-05-18 MED ORDER — KETOROLAC TROMETHAMINE 30 MG/ML IJ SOLN
30.0000 mg | Freq: Once | INTRAMUSCULAR | Status: AC
  Administered 2015-05-18: 30 mg via INTRAVENOUS
  Filled 2015-05-18: qty 1

## 2015-05-18 MED ORDER — TAMSULOSIN HCL 0.4 MG PO CAPS
0.4000 mg | ORAL_CAPSULE | Freq: Every day | ORAL | Status: DC
  Administered 2015-05-18: 0.4 mg via ORAL
  Filled 2015-05-18: qty 1

## 2015-05-18 MED ORDER — ONDANSETRON 8 MG PO TBDP: ORAL_TABLET | ORAL | Status: DC

## 2015-05-18 MED ORDER — TAMSULOSIN HCL 0.4 MG PO CAPS: 0.8000 mg | ORAL_CAPSULE | Freq: Every day | ORAL | Status: DC

## 2015-05-18 MED ORDER — NAPROXEN 375 MG PO TABS: 375.0000 mg | ORAL_TABLET | Freq: Two times a day (BID) | ORAL | Status: DC

## 2015-05-18 NOTE — ED Provider Notes (Addendum)
CSN: [id number]     Arrival date & time 05/18/15  0111 History   First MD Initiated Contact with Patient 05/18/15 0134     Chief Complaint  Patient presents with  . Flank Pain     (Consider location/radiation/quality/duration/timing/severity/associated sxs/prior Treatment) Patient is a 55 y.o. female presenting with flank pain. The history is provided by the patient.  Flank Pain This is a new problem. The current episode started more than 2 days ago. The problem occurs constantly. The problem has been gradually worsening. Pertinent negatives include no chest pain, no headaches and no shortness of breath. Nothing aggravates the symptoms. Nothing relieves the symptoms. Treatments tried: oxycodone. The treatment provided no relief.    Past Medical History  Diagnosis Date  . Hypertension   . Diabetes mellitus   . Gout   . Nephrolithiasis     recurrent  . Osteoarthritis, knee   . GERD (gastroesophageal reflux disease)   . Depression   . Rotator cuff syndrome of left shoulder   . Carpal tunnel syndrome, left   . Anxiety   . DUB (dysfunctional uterine bleeding)   . Allergic rhinitis   . External hemorrhoid   . Morbid obesity ([place])   . Recurrent UTI   . Obesity    Past Surgical History  Procedure Laterality Date  . Lithotripsy    . Replacement total knee bilateral    . Cholecystectomy    . Tubal ligation    . Incision and drainage abscess N/A 06/01/2014    Procedure: INCISION AND DRAINAGE ABSCESS;  Surgeon: Rolm Bookbinder, MD;  Location: [facility] OR;  Service: General;  Laterality: N/A;   Family History  Problem Relation Age of Onset  . Coronary artery disease    . Hypertension    . Diabetes    . Obesity    . Diabetes Mother   . Hypertension Mother    Social History  Substance Use Topics  . Smoking status: Never Smoker   . Smokeless tobacco: Never Used  . Alcohol Use: No   OB History    Gravida Para Term Preterm AB TAB SAB Ectopic Multiple Living   4         3       Obstetric Comments   Patient had two twin pregnancies.  One Pregnancy resulted in miscarriage completely and second twin pregnancy resulted in one live birth.  She also had a son that was killed in the [employer].     Review of Systems  Respiratory: Negative for shortness of breath.   Cardiovascular: Negative for chest pain.  Gastrointestinal: Positive for nausea and vomiting.  Genitourinary: Positive for hematuria and flank pain.  Neurological: Negative for headaches.  All other systems reviewed and are negative.     Allergies  Propoxyphene n-acetaminophen  Home Medications   Prior to Admission medications   Medication Sig Start Date End Date Taking? Authorizing Provider  pantoprazole (PROTONIX) 40 MG tablet Take 40 mg by mouth daily.   Yes Historical Provider, MD  ALPRAZolam Duanne Moron) 1 MG tablet Take 1 mg by mouth 3 (three) times daily as needed for anxiety.  10/18/11   Historical Provider, MD  benazepril (LOTENSIN) 20 MG tablet TAKE 1 TABLET BY MOUTH ONCE A DAY AS DIRECTED 11/07/14   Historical Provider, MD  carisoprodol (SOMA) 350 MG tablet Take 1 tablet (350 mg total) by mouth 3 (three) times daily. 12/05/14   Dickie La, MD  cephALEXin (KEFLEX) 500 MG capsule Take 1 capsule (500 mg  total) by mouth 4 (four) times daily. 11/06/14   Veryl Speak, MD  cetirizine (ZYRTEC) 10 MG tablet Take 10 mg by mouth daily. 05/23/14   Historical Provider, MD  colchicine (COLCRYS) 0.6 MG tablet Take 1 tablet (0.6 mg total) by mouth daily. 05/01/15   Dickie La, MD  doxycycline (VIBRA-TABS) 100 MG tablet TAKE 1 TABLET BY MOUTH TWICE A DAY AS DIRECTED 12/04/14   Historical Provider, MD  econazole nitrate 1 % cream APP EXT AA BID 10/03/14   Historical Provider, MD  erythromycin ethylsuccinate (EES) 400 MG tablet  05/30/14   Historical Provider, MD  fluconazole (DIFLUCAN) 150 MG tablet TAKE 1 TABLET BY MOUTH ONCE. TAKE THE SECOND TABLET 3 DAYS LATER 11/24/14   Emily Filbert, MD  glipiZIDE (GLUCOTROL XL) 5 MG 24 hr  tablet  07/24/14   Historical Provider, MD  glipiZIDE (GLUCOTROL) 5 MG tablet Take 5 mg by mouth 2 (two) times daily.      Historical Provider, MD  hydrochlorothiazide (HYDRODIURIL) 25 MG tablet Take 25 mg by mouth daily.    Historical Provider, MD  HYDROcodone-acetaminophen (NORCO/VICODIN) 5-325 MG per tablet Take 1 tablet by mouth every 4 (four) hours as needed. 07/01/14   Historical Provider, MD  insulin detemir (LEVEMIR) 100 UNIT/ML injection Inject 60 Units into the skin at bedtime.    Historical Provider, MD  Insulin Pen Needle (B-D ULTRAFINE III SHORT PEN) 31G X 8 MM MISC Use as directed once daily with Lantus solostar pen 06/02/10   Nneka Isamah  LEVEMIR FLEXTOUCH 100 UNIT/ML Pen  05/08/14   Historical Provider, MD  lidocaine (XYLOCAINE) 5 % ointment  05/29/14   Historical Provider, MD  metFORMIN (GLUCOPHAGE) 1000 MG tablet Take 1,000 mg by mouth 2 (two) times daily with a meal.     Historical Provider, MD  metoprolol (TOPROL-XL) 200 MG 24 hr tablet Take 200 mg by mouth daily.     Historical Provider, MD  metroNIDAZOLE (FLAGYL) 500 MG tablet TAKE 1 TABLET BY MOUTH TWICE DAILY 11/24/14   Emily Filbert, MD  naproxen (NAPROSYN) 375 MG tablet Take 1 tablet (375 mg total) by mouth 2 (two) times daily. 05/18/15   Kjirsten Bloodgood, MD  omeprazole (PRILOSEC) 20 MG capsule Take 1 capsule (20 mg total) by mouth 2 (two) times daily. Patient not taking: Reported on 05/31/2014 05/12/13   [name], MD  ondansetron [facility] ODT) 4 MG disintegrating tablet 4mg  ODT q4 hours prn nausea/vomit Patient not taking: Reported on 05/31/2014 04/20/14   Elnora Morrison, MD  ondansetron [facility] ODT) 8 MG disintegrating tablet 8mg  ODT q8 hours prn nausea 05/18/15   Emerie Vanderkolk, MD  oxyCODONE (ROXICODONE) 15 MG immediate release tablet  06/24/14   Historical Provider, MD  Oxycodone HCl 10 MG TABS Take 1.5-2 tablets (15-20 mg total) by mouth every 6 (six) hours as needed. 06/02/14   Shanker Kristeen Mans, MD  oxyCODONE-acetaminophen (PERCOCET)  5-325 MG per tablet Take 1-2 tablets by mouth every 6 (six) hours as needed. 11/06/14   Veryl Speak, MD  pantoprazole (PROTONIX) 40 MG tablet Take 40 mg by mouth daily.     Historical Provider, MD  phentermine (ADIPEX-P) 37.5 MG tablet TK 1 T PO QD 10/31/14   Historical Provider, MD  promethazine (PHENERGAN) 25 MG tablet Take 1 tablet (25 mg total) by mouth every 6 (six) hours as needed for nausea or vomiting. 10/08/13   Fredia Sorrow, MD  tamsulosin (FLOMAX) 0.4 MG CAPS capsule  04/24/14   Historical Provider,  MD  tamsulosin (FLOMAX) 0.4 MG CAPS capsule Take 1 capsule (0.4 mg total) by mouth daily. 05/18/15   Casi Westerfeld, MD   BP 124/71 mmHg  Pulse 78  Temp(Src) 98.6 F (37 C) (Oral)  Resp 25  Ht 5\' 9"  (1.753 m)  Wt 285 lb (129.275 kg)  BMI 42.07 kg/m2  SpO2 94%  LMP 04/11/2009 Physical Exam  Constitutional: She is oriented to person, place, and time. She appears well-developed and well-nourished. No distress.  HENT:  Head: Normocephalic and atraumatic.  Mouth/Throat: Oropharynx is clear and moist.  Eyes: Conjunctivae are normal. Pupils are equal, round, and reactive to light.  Neck: Normal range of motion. Neck supple.  Cardiovascular: Normal rate, regular rhythm and intact distal pulses.   Pulmonary/Chest: Effort normal and breath sounds normal. No respiratory distress. She has no wheezes. She has no rales.  Abdominal: Soft. Bowel sounds are normal. There is no tenderness. There is no rebound and no guarding.  Musculoskeletal: Normal range of motion.  Neurological: She is alert and oriented to person, place, and time.  Skin: Skin is warm and dry.  Psychiatric: She has a normal mood and affect.    ED Course  Procedures (including critical care time) Labs Review Labs Reviewed  URINALYSIS, ROUTINE W REFLEX MICROSCOPIC (NOT AT [facility]) - Abnormal; Notable for the following:    Hgb urine dipstick TRACE (*)    All other components within normal limits  CBC WITH DIFFERENTIAL/PLATELET  - Abnormal; Notable for the following:    Hemoglobin 11.9 (*)    All other components within normal limits  BASIC METABOLIC PANEL - Abnormal; Notable for the following:    Glucose, Bld 427 (*)    BUN 25 (*)    Creatinine, Ser 1.11 (*)    GFR calc non Af Amer 55 (*)    All other components within normal limits  URINE MICROSCOPIC-ADD ON - Abnormal; Notable for the following:    Squamous Epithelial / LPF 0-5 (*)    Bacteria, UA FEW (*)    All other components within normal limits  CBG MONITORING, ED - Abnormal; Notable for the following:    Glucose-Capillary 375 (*)    All other components within normal limits  CBG MONITORING, ED - Abnormal; Notable for the following:    Glucose-Capillary 346 (*)    All other components within normal limits  URINALYSIS, ROUTINE W REFLEX MICROSCOPIC (NOT AT [facility])    Imaging Review Ct Renal Stone Study  05/18/2015  CLINICAL DATA:  55 year old female with history of kidney stones presenting with right flank pain. EXAM: CT ABDOMEN AND PELVIS WITHOUT CONTRAST TECHNIQUE: Multidetector CT imaging of the abdomen and pelvis was performed following the standard protocol without IV contrast. COMPARISON:  CT dated 05/31/2014 FINDINGS: Evaluation of this exam is limited in the absence of intravenous contrast. Evaluation is also limited due to streak artifact caused by patient's arms. The visualized lung bases are clear. No intra-abdominal free air or free fluid. Cholecystectomy. The liver, pancreas, spleen, and the left adrenal gland appear unremarkable. A 2.2 cm right adrenal probable adenoma. There is a 7 mm right ureteropelvic junction stone. There is mild right hydronephrosis. The left kidney is unremarkable. The urinary bladder and the uterus appear unremarkable. Moderate stool throughout the colon. No evidence of bowel obstruction or inflammation. Normal appendix. The abdominal aorta and IVC appear grossly unremarkable on this noncontrast study. No portal venous gas  identified. There is no adenopathy. There is fatty infiltration of the right rectus  abdominus muscle. Focal area of density in the subcutaneous soft tissues of the left anterior pelvic wall extending into the skin likely represents scarring sequela of prior inflammation. There is degenerative changes of the spine. No acute fracture. IMPRESSION: A 7 mm right UPJ stone with mild right hydronephrosis. Electronically Signed   By: Anner Crete M.D.   On: 05/18/2015 02:26   I have personally reviewed and evaluated these images and lab results as part of my medical decision-making.   EKG Interpretation None      MDM   Final diagnoses:  Pain  Kidney stone    Medications  tamsulosin (FLOMAX) capsule 0.4 mg (0.4 mg Oral Given 05/18/15 0346)  ketorolac (TORADOL) 30 MG/ML injection 30 mg (30 mg Intravenous Given 05/18/15 0214)  ondansetron (ZOFRAN) injection 4 mg (4 mg Intravenous Given 05/18/15 0214)  fentaNYL (SUBLIMAZE) injection 100 mcg (100 mcg Intravenous Given 05/18/15 0214)  morphine 4 MG/ML injection 4 mg (4 mg Intravenous Given 05/18/15 0346)  insulin regular (NOVOLIN R,HUMULIN R) 100 units/mL injection 3 Units (3 Units Subcutaneous Given 05/18/15 0346)   Results for orders placed or performed during the hospital encounter of 05/18/15  Urinalysis, Routine w reflex microscopic (not at [facility])  Result Value Ref Range   Color, Urine YELLOW YELLOW   APPearance CLEAR CLEAR   Specific Gravity, Urine 1.026 1.005 - 1.030   pH 6.5 5.0 - 8.0   Glucose, UA NEGATIVE NEGATIVE mg/dL   Hgb urine dipstick TRACE (A) NEGATIVE   Bilirubin Urine NEGATIVE NEGATIVE   Ketones, ur NEGATIVE NEGATIVE mg/dL   Protein, ur NEGATIVE NEGATIVE mg/dL   Nitrite NEGATIVE NEGATIVE   Leukocytes, UA NEGATIVE NEGATIVE  CBC with Differential/Platelet  Result Value Ref Range   WBC 7.4 4.0 - 10.5 K/uL   RBC 4.32 3.87 - 5.11 MIL/uL   Hemoglobin 11.9 (L) 12.0 - 15.0 g/dL   HCT 38.4 36.0 - 46.0 %   MCV 88.9 78.0 - 100.0 fL    MCH 27.5 26.0 - 34.0 pg   MCHC 31.0 30.0 - 36.0 g/dL   RDW 13.8 11.5 - 15.5 %   Platelets 290 150 - 400 K/uL   Neutrophils Relative % 43 %   Neutro Abs 3.1 1.7 - 7.7 K/uL   Lymphocytes Relative 50 %   Lymphs Abs 3.7 0.7 - 4.0 K/uL   Monocytes Relative 6 %   Monocytes Absolute 0.4 0.1 - 1.0 K/uL   Eosinophils Relative 1 %   Eosinophils Absolute 0.1 0.0 - 0.7 K/uL   Basophils Relative 0 %   Basophils Absolute 0.0 0.0 - 0.1 K/uL  Basic metabolic panel  Result Value Ref Range   Sodium 139 135 - 145 mmol/L   Potassium 4.4 3.5 - 5.1 mmol/L   Chloride 102 101 - 111 mmol/L   CO2 28 22 - 32 mmol/L   Glucose, Bld 427 (H) 65 - 99 mg/dL   BUN 25 (H) 6 - 20 mg/dL   Creatinine, Ser 1.11 (H) 0.44 - 1.00 mg/dL   Calcium 9.1 8.9 - 10.3 mg/dL   GFR calc non Af Amer 55 (L) >60 mL/min   GFR calc Af Amer >60 >60 mL/min   Anion gap 9 5 - 15  Urine microscopic-add on  Result Value Ref Range   Squamous Epithelial / LPF 0-5 (A) NONE SEEN   WBC, UA 0-5 0 - 5 WBC/hpf   RBC / HPF TOO NUMEROUS TO COUNT 0 - 5 RBC/hpf   Bacteria, UA FEW (A) NONE  SEEN  CBG monitoring, ED  Result Value Ref Range   Glucose-Capillary 375 (H) 65 - 99 mg/dL  CBG monitoring, ED  Result Value Ref Range   Glucose-Capillary 346 (H) 65 - 99 mg/dL   Ct Renal Stone Study  05/18/2015  CLINICAL DATA:  55 year old female with history of kidney stones presenting with right flank pain. EXAM: CT ABDOMEN AND PELVIS WITHOUT CONTRAST TECHNIQUE: Multidetector CT imaging of the abdomen and pelvis was performed following the standard protocol without IV contrast. COMPARISON:  CT dated 05/31/2014 FINDINGS: Evaluation of this exam is limited in the absence of intravenous contrast. Evaluation is also limited due to streak artifact caused by patient's arms. The visualized lung bases are clear. No intra-abdominal free air or free fluid. Cholecystectomy. The liver, pancreas, spleen, and the left adrenal gland appear unremarkable. A 2.2 cm right adrenal  probable adenoma. There is a 7 mm right ureteropelvic junction stone. There is mild right hydronephrosis. The left kidney is unremarkable. The urinary bladder and the uterus appear unremarkable. Moderate stool throughout the colon. No evidence of bowel obstruction or inflammation. Normal appendix. The abdominal aorta and IVC appear grossly unremarkable on this noncontrast study. No portal venous gas identified. There is no adenopathy. There is fatty infiltration of the right rectus abdominus muscle. Focal area of density in the subcutaneous soft tissues of the left anterior pelvic wall extending into the skin likely represents scarring sequela of prior inflammation. There is degenerative changes of the spine. No acute fracture. IMPRESSION: A 7 mm right UPJ stone with mild right hydronephrosis. Electronically Signed   By: Anner Crete M.D.   On: 05/18/2015 02:26    Has oxycodone at home.  Will add flomax, zofran, and naproxen.  Strain all urine and follow up with urology.  Strict return precautions    Vearl Aitken, MD 05/18/15 0553  Shelisa Fern, MD 05/18/15 [phone]

## 2015-05-18 NOTE — ED Notes (Signed)
Pt. States hx of kidney stones. C/o right flank pain  with blood in urine that started on Thursday but worse today. C/o nausea and vomiting times once pta. Has taken oxycodone and phenergan which has not helped.

## 2015-05-19 DIAGNOSIS — N3021 Other chronic cystitis with hematuria: Secondary | ICD-10-CM | POA: Diagnosis not present

## 2015-05-19 DIAGNOSIS — N23 Unspecified renal colic: Secondary | ICD-10-CM | POA: Diagnosis not present

## 2015-05-19 DIAGNOSIS — N201 Calculus of ureter: Secondary | ICD-10-CM | POA: Diagnosis not present

## 2015-05-19 DIAGNOSIS — R311 Benign essential microscopic hematuria: Secondary | ICD-10-CM | POA: Diagnosis not present

## 2015-05-19 DIAGNOSIS — N1330 Unspecified hydronephrosis: Secondary | ICD-10-CM | POA: Diagnosis not present

## 2015-05-20 DIAGNOSIS — I10 Essential (primary) hypertension: Secondary | ICD-10-CM | POA: Diagnosis not present

## 2015-05-20 DIAGNOSIS — F419 Anxiety disorder, unspecified: Secondary | ICD-10-CM | POA: Diagnosis not present

## 2015-05-20 DIAGNOSIS — Z9049 Acquired absence of other specified parts of digestive tract: Secondary | ICD-10-CM | POA: Diagnosis not present

## 2015-05-20 DIAGNOSIS — Z6841 Body Mass Index (BMI) 40.0 and over, adult: Secondary | ICD-10-CM | POA: Diagnosis not present

## 2015-05-20 DIAGNOSIS — K219 Gastro-esophageal reflux disease without esophagitis: Secondary | ICD-10-CM | POA: Diagnosis not present

## 2015-05-20 DIAGNOSIS — Z794 Long term (current) use of insulin: Secondary | ICD-10-CM | POA: Diagnosis not present

## 2015-05-20 DIAGNOSIS — E119 Type 2 diabetes mellitus without complications: Secondary | ICD-10-CM | POA: Diagnosis not present

## 2015-05-20 DIAGNOSIS — E669 Obesity, unspecified: Secondary | ICD-10-CM | POA: Diagnosis not present

## 2015-05-20 DIAGNOSIS — N201 Calculus of ureter: Secondary | ICD-10-CM | POA: Diagnosis not present

## 2015-05-20 DIAGNOSIS — Z79899 Other long term (current) drug therapy: Secondary | ICD-10-CM | POA: Diagnosis not present

## 2015-05-21 DIAGNOSIS — N3021 Other chronic cystitis with hematuria: Secondary | ICD-10-CM | POA: Diagnosis not present

## 2015-05-21 DIAGNOSIS — R311 Benign essential microscopic hematuria: Secondary | ICD-10-CM | POA: Diagnosis not present

## 2015-05-21 DIAGNOSIS — N1330 Unspecified hydronephrosis: Secondary | ICD-10-CM | POA: Diagnosis not present

## 2015-05-21 DIAGNOSIS — N201 Calculus of ureter: Secondary | ICD-10-CM | POA: Diagnosis not present

## 2015-05-21 DIAGNOSIS — N23 Unspecified renal colic: Secondary | ICD-10-CM | POA: Diagnosis not present

## 2015-05-28 DIAGNOSIS — N201 Calculus of ureter: Secondary | ICD-10-CM | POA: Diagnosis not present

## 2015-06-15 DIAGNOSIS — R0600 Dyspnea, unspecified: Secondary | ICD-10-CM | POA: Diagnosis not present

## 2015-06-15 DIAGNOSIS — E119 Type 2 diabetes mellitus without complications: Secondary | ICD-10-CM | POA: Diagnosis not present

## 2015-06-15 DIAGNOSIS — J209 Acute bronchitis, unspecified: Secondary | ICD-10-CM | POA: Diagnosis not present

## 2015-06-15 DIAGNOSIS — J9801 Acute bronchospasm: Secondary | ICD-10-CM | POA: Diagnosis not present

## 2015-06-22 DIAGNOSIS — L0292 Furuncle, unspecified: Secondary | ICD-10-CM | POA: Diagnosis not present

## 2015-06-22 DIAGNOSIS — J029 Acute pharyngitis, unspecified: Secondary | ICD-10-CM | POA: Diagnosis not present

## 2015-06-22 DIAGNOSIS — Z112 Encounter for screening for other bacterial diseases: Secondary | ICD-10-CM | POA: Diagnosis not present

## 2015-06-22 DIAGNOSIS — J209 Acute bronchitis, unspecified: Secondary | ICD-10-CM | POA: Diagnosis not present

## 2015-07-13 ENCOUNTER — Telehealth: Payer: Self-pay | Admitting: *Deleted

## 2015-07-13 DIAGNOSIS — B379 Candidiasis, unspecified: Secondary | ICD-10-CM

## 2015-07-13 MED ORDER — FLUCONAZOLE 200 MG PO TABS: 200.0000 mg | ORAL_TABLET | Freq: Once | ORAL | Status: DC

## 2015-07-13 NOTE — Telephone Encounter (Signed)
Pt has hx of yeast infection every few months, in the past has used the Diflucan 200mg  tablets, requesting refill. Sent rx to pharmacy per Dr Hulan Fray.

## 2015-07-13 NOTE — Telephone Encounter (Signed)
-----   Message from Francia Greaves sent at 07/13/2015  1:29 PM EDT ----- Regarding: Rx Request Contact: [phone] Has not been seen here since 2015  Having symptoms of a yeast infection, wants the 200mg  Diflucan called into her pharmacy  Uses [facility] in [place]

## 2015-07-16 ENCOUNTER — Telehealth: Payer: Self-pay | Admitting: *Deleted

## 2015-07-16 DIAGNOSIS — N760 Acute vaginitis: Principal | ICD-10-CM

## 2015-07-16 DIAGNOSIS — B9689 Other specified bacterial agents as the cause of diseases classified elsewhere: Secondary | ICD-10-CM

## 2015-07-16 MED ORDER — METRONIDAZOLE 500 MG PO TABS: 500.0000 mg | ORAL_TABLET | Freq: Two times a day (BID) | ORAL | Status: DC

## 2015-07-16 NOTE — Telephone Encounter (Signed)
Pt c/o vaginal discharge with foul smelling odor consistent with BV, pt had taken a round of antibiotics for bronchitis and had received Diflucan for yeast infection related to antibiotic therapy.  Will send in Flagyl to treat the BV.  Informed pt that after the treatment if she still continues to be symptomatic and have problems with vaginitis that she will need to come in and see Dr Hulan Fray.  Pt acknowledged.

## 2015-07-27 DIAGNOSIS — G4701 Insomnia due to medical condition: Secondary | ICD-10-CM | POA: Diagnosis not present

## 2015-07-27 DIAGNOSIS — F419 Anxiety disorder, unspecified: Secondary | ICD-10-CM | POA: Diagnosis not present

## 2015-07-27 DIAGNOSIS — E119 Type 2 diabetes mellitus without complications: Secondary | ICD-10-CM | POA: Diagnosis not present

## 2015-07-27 DIAGNOSIS — Z139 Encounter for screening, unspecified: Secondary | ICD-10-CM | POA: Diagnosis not present

## 2015-07-27 DIAGNOSIS — I10 Essential (primary) hypertension: Secondary | ICD-10-CM | POA: Diagnosis not present

## 2015-07-27 DIAGNOSIS — Z833 Family history of diabetes mellitus: Secondary | ICD-10-CM | POA: Diagnosis not present

## 2015-08-11 ENCOUNTER — Telehealth: Payer: Self-pay | Admitting: *Deleted

## 2015-08-11 NOTE — Telephone Encounter (Signed)
Received a fax refill request from pharmacy for carisoprodol 350mg , TID. Let me know if this is ok to refill.

## 2015-08-12 ENCOUNTER — Other Ambulatory Visit: Payer: Self-pay | Admitting: *Deleted

## 2015-08-12 MED ORDER — CARISOPRODOL 350 MG PO TABS: 350.0000 mg | ORAL_TABLET | Freq: Three times a day (TID) | ORAL | Status: DC

## 2015-08-12 NOTE — Telephone Encounter (Signed)
Rhea OK one month refill then she needs appt THANKS! Dorcas Mcmurray

## 2015-08-13 DIAGNOSIS — R197 Diarrhea, unspecified: Secondary | ICD-10-CM | POA: Diagnosis not present

## 2015-08-13 DIAGNOSIS — J45909 Unspecified asthma, uncomplicated: Secondary | ICD-10-CM | POA: Diagnosis not present

## 2015-08-13 DIAGNOSIS — N760 Acute vaginitis: Secondary | ICD-10-CM | POA: Diagnosis not present

## 2015-08-17 DIAGNOSIS — Z139 Encounter for screening, unspecified: Secondary | ICD-10-CM | POA: Diagnosis not present

## 2015-08-24 DIAGNOSIS — N390 Urinary tract infection, site not specified: Secondary | ICD-10-CM | POA: Diagnosis not present

## 2015-08-24 DIAGNOSIS — R300 Dysuria: Secondary | ICD-10-CM | POA: Diagnosis not present

## 2015-08-28 ENCOUNTER — Ambulatory Visit: Payer: PPO | Admitting: Family Medicine

## 2015-09-12 ENCOUNTER — Other Ambulatory Visit: Payer: Self-pay | Admitting: Family Medicine

## 2015-09-14 NOTE — Telephone Encounter (Signed)
Please call the patient, she needs an appointment before future refills

## 2015-09-15 ENCOUNTER — Other Ambulatory Visit: Payer: Self-pay | Admitting: Family Medicine

## 2015-09-17 DIAGNOSIS — R52 Pain, unspecified: Secondary | ICD-10-CM | POA: Diagnosis not present

## 2015-09-17 DIAGNOSIS — M0630 Rheumatoid nodule, unspecified site: Secondary | ICD-10-CM | POA: Diagnosis not present

## 2015-09-17 DIAGNOSIS — E119 Type 2 diabetes mellitus without complications: Secondary | ICD-10-CM | POA: Diagnosis not present

## 2015-09-17 DIAGNOSIS — J3489 Other specified disorders of nose and nasal sinuses: Secondary | ICD-10-CM | POA: Diagnosis not present

## 2015-09-25 ENCOUNTER — Ambulatory Visit (INDEPENDENT_AMBULATORY_CARE_PROVIDER_SITE_OTHER): Payer: PPO | Admitting: Family Medicine

## 2015-09-25 ENCOUNTER — Encounter: Payer: Self-pay | Admitting: Family Medicine

## 2015-09-25 VITALS — BP 144/82 | HR 68 | Ht 68.5 in | Wt 343.0 lb

## 2015-09-25 DIAGNOSIS — G5752 Tarsal tunnel syndrome, left lower limb: Secondary | ICD-10-CM

## 2015-09-25 DIAGNOSIS — M545 Low back pain, unspecified: Secondary | ICD-10-CM

## 2015-09-25 DIAGNOSIS — M25572 Pain in left ankle and joints of left foot: Secondary | ICD-10-CM

## 2015-09-25 NOTE — Progress Notes (Signed)
Maria [name] - 55 y.o. female MRN [id number]  Date of birth: [birth year]  CC: Left ankle swelling and chronic low back pain  SUBJECTIVE:   HPI Maria [name] is a very pleasant 55 year old female with multiple medical comorbidities listed below. She presents today with both new onset left ankle swelling and chronic low back pain. - In regards to her back pain she states this is been persistent since she had her total knee replacements in 2014. She denies any new changes. She is prescribed narcotic medications by her PCP for this. She denies any weakness in her legs or radiation down her legs. She denies any bowel or bladder dysfunction. She states it's in the midline. She has not done any physical therapy for this. She does have known arthritis in her back. She's had an epidural injection in the past which is been quite helpful, which she believes is in 2015 and was confirmed in the EMR. - In regards to her left ankle swelling this is been ongoing for 1 month. Usually it worsens through the day. She has no calf swelling. She has no pain. She wears sandals at all times. She denies any traumatic or nontraumatic injury. She does not take a calcium channel blocker. She does take ibuprofen occasionally for her back pain.  ROS:     As above no fevers chills or night sweats. No rashes or joint swelling other than that outlined above.  HISTORY: Past Medical, Surgical, Social, and Family History Reviewed & Updated per EMR.  Pertinent Historical Findings include: Hypertension, sleep apnea, type 2 diabetes, trochanteric bursitis, morbid obesity, depression, status post bilateral total knee replacements  OBJECTIVE: BP 144/82 mmHg  Pulse 68  Ht 5' 8.5" (1.74 m)  Wt 343 lb (155.584 kg)  BMI 51.39 kg/m2  LMP 04/11/2009  Physical Exam  Calm, no acute distress Nonlabored breathing  Tenderness to palpation along the mid lumbar region. Most pain is midline rather than on the paraspinal muscles. Pain with prolonged  sitting during the exam and with standing. Hip motion is smooth and nonlimited. Neurovascularly intact distally.  Generalized swelling around the ankle and forefoot on the left. No tenderness to palpation. No calf swelling. Calf appears to be within 1 cm of circumference compared to the right side. Ankle appears stable with anterior drawer and talar tilt. Swelling most prominent in the sinus tarsi region. Longitudinal arch breakdown present. Subtalar joint does not appear stiff.  MEDICATIONS, LABS & OTHER ORDERS: Previous Medications   ALPRAZOLAM (XANAX) 1 MG TABLET    Take 1 mg by mouth 3 (three) times daily as needed for anxiety.    B-D INS SYR ULTRAFINE 1CC/31G 31G X 5/16" 1 ML MISC       BENAZEPRIL (LOTENSIN) 20 MG TABLET    TAKE 1 TABLET BY MOUTH ONCE A DAY AS DIRECTED   BREO ELLIPTA 100-25 MCG/INH AEPB    INHALE 1 PUFF INTO LUNGS QD   CARISOPRODOL (SOMA) 350 MG TABLET    TAKE 1 TABLET BY MOUTH THREE TIMES DAILY   CEPHALEXIN (KEFLEX) 500 MG CAPSULE    Take 1 capsule (500 mg total) by mouth 4 (four) times daily.   CETIRIZINE (ZYRTEC) 10 MG TABLET    Take 10 mg by mouth daily.   COLCHICINE (COLCRYS) 0.6 MG TABLET    Take 1 tablet (0.6 mg total) by mouth daily.   DOXYCYCLINE (VIBRA-TABS) 100 MG TABLET    TAKE 1 TABLET BY MOUTH TWICE A DAY AS DIRECTED   ECONAZOLE NITRATE  1 % CREAM    APP EXT AA BID   ERYTHROMYCIN ETHYLSUCCINATE (EES) 400 MG TABLET       FLUCONAZOLE (DIFLUCAN) 150 MG TABLET    TAKE 1 TABLET BY MOUTH ONCE. TAKE THE SECOND TABLET 3 DAYS LATER   FLUCONAZOLE (DIFLUCAN) 200 MG TABLET    Take 1 tablet (200 mg total) by mouth once. May repeat in 72 hrs.   GLIPIZIDE (GLUCOTROL XL) 5 MG 24 HR TABLET       GLIPIZIDE (GLUCOTROL) 5 MG TABLET    Take 5 mg by mouth 2 (two) times daily.     HYDROCHLOROTHIAZIDE (HYDRODIURIL) 25 MG TABLET    Take 25 mg by mouth daily.   HYDROCODONE-ACETAMINOPHEN (NORCO/VICODIN) 5-325 MG PER TABLET    Take 1 tablet by mouth every 4 (four) hours as needed.    IBUPROFEN (ADVIL,MOTRIN) 800 MG TABLET       INSULIN DETEMIR (LEVEMIR) 100 UNIT/ML INJECTION    Inject 60 Units into the skin at bedtime.   INSULIN PEN NEEDLE (B-D ULTRAFINE III SHORT PEN) 31G X 8 MM MISC    Use as directed once daily with Lantus solostar pen   LEVEMIR FLEXTOUCH 100 UNIT/ML PEN       LIDOCAINE (XYLOCAINE) 5 % OINTMENT       METFORMIN (GLUCOPHAGE) 1000 MG TABLET    Take 1,000 mg by mouth 2 (two) times daily with a meal.    METOPROLOL (TOPROL-XL) 200 MG 24 HR TABLET    Take 200 mg by mouth daily.    METRONIDAZOLE (FLAGYL) 500 MG TABLET    Take 1 tablet (500 mg total) by mouth 2 (two) times daily.   NAPROXEN (NAPROSYN) 375 MG TABLET    Take 1 tablet (375 mg total) by mouth 2 (two) times daily.   OMEPRAZOLE (PRILOSEC) 20 MG CAPSULE    Take 1 capsule (20 mg total) by mouth 2 (two) times daily.   ONDANSETRON (ZOFRAN ODT) 4 MG DISINTEGRATING TABLET    4mg  ODT q4 hours prn nausea/vomit   ONDANSETRON (ZOFRAN ODT) 8 MG DISINTEGRATING TABLET    8mg  ODT q8 hours prn nausea   OXYCODONE (ROXICODONE) 15 MG IMMEDIATE RELEASE TABLET       OXYCODONE HCL 10 MG TABS    Take 1.5-2 tablets (15-20 mg total) by mouth every 6 (six) hours as needed.   OXYCODONE-ACETAMINOPHEN (PERCOCET) 5-325 MG PER TABLET    Take 1-2 tablets by mouth every 6 (six) hours as needed.   PANTOPRAZOLE (PROTONIX) 40 MG TABLET    Take 40 mg by mouth daily.    PANTOPRAZOLE (PROTONIX) 40 MG TABLET    Take 40 mg by mouth daily.   PHENAZOPYRIDINE (PYRIDIUM) 200 MG TABLET    TK 1 T PO  TID AS DIRECTED   PHENTERMINE (ADIPEX-P) 37.5 MG TABLET    TK 1 T PO QD   PIOGLITAZONE (ACTOS) 30 MG TABLET    Take 30 mg by mouth daily.   PROMETHAZINE (PHENERGAN) 25 MG TABLET    Take 1 tablet (25 mg total) by mouth every 6 (six) hours as needed for nausea or vomiting.   TAMSULOSIN (FLOMAX) 0.4 MG CAPS CAPSULE       TAMSULOSIN (FLOMAX) 0.4 MG CAPS CAPSULE    Take 1 capsule (0.4 mg total) by mouth daily.   VIRTUSSIN A/C 100-10 MG/5ML SYRUP    TAKE 5  MLS BY MOUTH AT BEDTIME   Modified Medications   No medications on file   New Prescriptions  No medications on file   Discontinued Medications   No medications on file   Orders Placed This Encounter  Procedures  . DG Lumbar Spine Complete  . Ambulatory referral to Physical Therapy   ASSESSMENT & PLAN: Left ankle swelling: This appears to be sinus tarsi swelling. This is likely partially secondary to her weight. We discussed avoiding NSAIDs, which is good advice for her anyway considering her diabetes. She does not appear to be on any calcium channel blocker. I think if we help strengthen her ankle, along with weight loss and a good ankle sleeve this will help her quite a bit. No pain at this point. We did not have an ankle sleeve that fit her so she will order one from [employer]. We will send her to PT to work on ankle strengthening. We have also mention the possibility of an orthotic down the line that would help if her ankle some support. Most importantly she needs to stop wearing flip-flops.  Chronic low back pain: This is chronic in nature and unchanged recently. She is on narcotics for her PCP. She has not had x-rays in several years. We will get a 4 view of the lumbar spine including AP, lateral, flexion, extension. Assuming this is unremarkable we will send her to physical therapy for core strengthening. We will also set her up for an epidural which she had in 2015 and helped her greatly.  She is due to be scheduled for bariatric surgery in the near future.

## 2015-10-01 DIAGNOSIS — J3489 Other specified disorders of nose and nasal sinuses: Secondary | ICD-10-CM | POA: Diagnosis not present

## 2015-10-06 DIAGNOSIS — B9681 Helicobacter pylori [H. pylori] as the cause of diseases classified elsewhere: Secondary | ICD-10-CM | POA: Diagnosis not present

## 2015-10-06 DIAGNOSIS — R52 Pain, unspecified: Secondary | ICD-10-CM | POA: Diagnosis not present

## 2015-10-09 DIAGNOSIS — E119 Type 2 diabetes mellitus without complications: Secondary | ICD-10-CM | POA: Diagnosis not present

## 2015-10-09 DIAGNOSIS — Z6841 Body Mass Index (BMI) 40.0 and over, adult: Secondary | ICD-10-CM | POA: Diagnosis not present

## 2015-10-10 DIAGNOSIS — H524 Presbyopia: Secondary | ICD-10-CM | POA: Diagnosis not present

## 2015-10-10 DIAGNOSIS — E11610 Type 2 diabetes mellitus with diabetic neuropathic arthropathy: Secondary | ICD-10-CM | POA: Diagnosis not present

## 2015-10-14 ENCOUNTER — Ambulatory Visit: Payer: PPO | Attending: Family Medicine | Admitting: Physical Therapy

## 2015-10-15 ENCOUNTER — Other Ambulatory Visit (HOSPITAL_COMMUNITY): Payer: Self-pay | Admitting: Surgery

## 2015-10-22 DIAGNOSIS — R52 Pain, unspecified: Secondary | ICD-10-CM | POA: Diagnosis not present

## 2015-10-22 DIAGNOSIS — R110 Nausea: Secondary | ICD-10-CM | POA: Diagnosis not present

## 2015-10-22 DIAGNOSIS — N200 Calculus of kidney: Secondary | ICD-10-CM | POA: Diagnosis not present

## 2015-10-24 ENCOUNTER — Emergency Department (HOSPITAL_BASED_OUTPATIENT_CLINIC_OR_DEPARTMENT_OTHER)
Admission: EM | Admit: 2015-10-24 | Discharge: 2015-10-24 | Disposition: A | Discharge status: Home / Self Care | Payer: PPO | Attending: Emergency Medicine | Admitting: Emergency Medicine

## 2015-10-24 ENCOUNTER — Emergency Department (HOSPITAL_BASED_OUTPATIENT_CLINIC_OR_DEPARTMENT_OTHER): Payer: PPO

## 2015-10-24 ENCOUNTER — Encounter (HOSPITAL_BASED_OUTPATIENT_CLINIC_OR_DEPARTMENT_OTHER): Payer: Self-pay | Admitting: Emergency Medicine

## 2015-10-24 DIAGNOSIS — I10 Essential (primary) hypertension: Secondary | ICD-10-CM | POA: Diagnosis not present

## 2015-10-24 DIAGNOSIS — E119 Type 2 diabetes mellitus without complications: Secondary | ICD-10-CM | POA: Insufficient documentation

## 2015-10-24 DIAGNOSIS — F329 Major depressive disorder, single episode, unspecified: Secondary | ICD-10-CM | POA: Insufficient documentation

## 2015-10-24 DIAGNOSIS — Z79899 Other long term (current) drug therapy: Secondary | ICD-10-CM | POA: Diagnosis not present

## 2015-10-24 DIAGNOSIS — R109 Unspecified abdominal pain: Secondary | ICD-10-CM

## 2015-10-24 DIAGNOSIS — Z7984 Long term (current) use of oral hypoglycemic drugs: Secondary | ICD-10-CM | POA: Diagnosis not present

## 2015-10-24 DIAGNOSIS — R110 Nausea: Secondary | ICD-10-CM | POA: Diagnosis not present

## 2015-10-24 LAB — CBC WITH DIFFERENTIAL/PLATELET
Basophils Absolute: 0 10*3/uL (ref 0.0–0.1)
Basophils Relative: 0 %
Eosinophils Absolute: 0.1 10*3/uL (ref 0.0–0.7)
Eosinophils Relative: 2 %
HCT: 40.9 % (ref 36.0–46.0)
Hemoglobin: 13.2 g/dL (ref 12.0–15.0)
Lymphocytes Relative: 54 %
Lymphs Abs: 3.4 10*3/uL (ref 0.7–4.0)
MCH: 28.8 pg (ref 26.0–34.0)
MCHC: 32.3 g/dL (ref 30.0–36.0)
MCV: 89.1 fL (ref 78.0–100.0)
Monocytes Absolute: 0.3 10*3/uL (ref 0.1–1.0)
Monocytes Relative: 5 %
Neutro Abs: 2.5 10*3/uL (ref 1.7–7.7)
Neutrophils Relative %: 39 %
Platelets: 282 10*3/uL (ref 150–400)
RBC: 4.59 MIL/uL (ref 3.87–5.11)
RDW: 14.6 % (ref 11.5–15.5)
WBC: 6.3 10*3/uL (ref 4.0–10.5)

## 2015-10-24 LAB — URINALYSIS, ROUTINE W REFLEX MICROSCOPIC
Bilirubin Urine: NEGATIVE
Glucose, UA: NEGATIVE mg/dL
Hgb urine dipstick: NEGATIVE
Ketones, ur: NEGATIVE mg/dL
Leukocytes, UA: NEGATIVE
Nitrite: NEGATIVE
Protein, ur: NEGATIVE mg/dL
Specific Gravity, Urine: 1.015 (ref 1.005–1.030)
pH: 5.5 (ref 5.0–8.0)

## 2015-10-24 LAB — BASIC METABOLIC PANEL
Anion gap: 6 (ref 5–15)
BUN: 19 mg/dL (ref 6–20)
CO2: 31 mmol/L (ref 22–32)
Calcium: 9.4 mg/dL (ref 8.9–10.3)
Chloride: 103 mmol/L (ref 101–111)
Creatinine, Ser: 1.14 mg/dL — ABNORMAL HIGH (ref 0.44–1.00)
GFR calc Af Amer: >60 mL/min (ref >60)
GFR calc non Af Amer: 53 mL/min — ABNORMAL LOW (ref >60)
Glucose, Bld: 180 mg/dL — ABNORMAL HIGH (ref 65–99)
Potassium: 4.4 mmol/L (ref 3.5–5.1)
Sodium: 140 mmol/L (ref 135–145)

## 2015-10-24 MED ORDER — ONDANSETRON HCL 4 MG/2ML IJ SOLN
4.0000 mg | Freq: Once | INTRAMUSCULAR | Status: AC
  Administered 2015-10-24: 4 mg via INTRAVENOUS
  Filled 2015-10-24: qty 2

## 2015-10-24 MED ORDER — HYDROMORPHONE HCL 1 MG/ML IJ SOLN
1.0000 mg | Freq: Once | INTRAMUSCULAR | Status: AC
  Administered 2015-10-24: 1 mg via INTRAVENOUS
  Filled 2015-10-24: qty 1

## 2015-10-24 MED ORDER — SODIUM CHLORIDE 0.9 % IV BOLUS (SEPSIS)
1000.0000 mL | Freq: Once | INTRAVENOUS | Status: AC
  Administered 2015-10-24: 1000 mL via INTRAVENOUS

## 2015-10-24 MED ORDER — KETOROLAC TROMETHAMINE 30 MG/ML IJ SOLN
30.0000 mg | Freq: Once | INTRAMUSCULAR | Status: AC
  Administered 2015-10-24: 30 mg via INTRAVENOUS
  Filled 2015-10-24: qty 1

## 2015-10-24 NOTE — ED Notes (Signed)
R flank pain since Thursday with nausea, no vomiting. Pt denies urinary symptoms.

## 2015-10-24 NOTE — Discharge Instructions (Signed)
Flank Pain °Flank pain refers to pain that is located on the side of the body between the upper abdomen and the back. The pain may occur over a short period of time (acute) or may be long-term or reoccurring (chronic). It may be mild or severe. Flank pain can be caused by many things. °CAUSES  °Some of the more common causes of flank pain include: °· Muscle strains.   °· Muscle spasms.   °· A disease of your spine (vertebral disk disease).   °· A lung infection (pneumonia).   °· Fluid around your lungs (pulmonary edema).   °· A kidney infection.   °· Kidney stones.   °· A very painful skin rash caused by the chickenpox virus (shingles).   °· Gallbladder disease.   °HOME CARE INSTRUCTIONS  °Home care will depend on the cause of your pain. In general, °· Rest as directed by your caregiver. °· Drink enough fluids to keep your urine clear or pale yellow. °· Only take over-the-counter or prescription medicines as directed by your caregiver. Some medicines may help relieve the pain. °· Tell your caregiver about any changes in your pain. °· Follow up with your caregiver as directed. °SEEK IMMEDIATE MEDICAL CARE IF:  °· Your pain is not controlled with medicine.   °· You have new or worsening symptoms. °· Your pain increases.   °· You have abdominal pain.   °· You have shortness of breath.   °· You have persistent nausea or vomiting.   °· You have swelling in your abdomen.   °· You feel faint or pass out.   °· You have blood in your urine. °· You have a fever or persistent symptoms for more than 2-3 days. °· You have a fever and your symptoms suddenly get worse. °MAKE SURE YOU:  °· Understand these instructions. °· Will watch your condition. °· Will get help right away if you are not doing well or get worse. °  °This information is not intended to replace advice given to you by your health care provider. Make sure you discuss any questions you have with your health care provider. °  °Document Released: 05/19/2005 Document  Revised: 12/21/2011 Document Reviewed: 11/10/2011 °Elsevier Interactive Patient Education ©2016 [employer] ° °

## 2015-10-24 NOTE — ED Notes (Signed)
Pt given d/c instructions as per chart. Verbalizes understanding. No questions. 

## 2015-10-24 NOTE — ED Provider Notes (Signed)
CSN: [id number]     Arrival date & time 10/24/15  1159 History   First MD Initiated Contact with Patient 10/24/15 1203     Chief Complaint  Patient presents with  . Flank Pain   HPI  Maria [name] is a 55 year old female with PMHx of HTN, DM, recurrent kidney stones, GERD and recurrent UTI presenting with flank pain. Onset of symptoms was 2 days ago. The pain is aching in nature and does not radiate. The pain is intermittent without specific exacerbating factors. Associated symptoms include nausea without vomiting. Patient states this feels similar to kidney stones in the past. She was seen by her PCP two days ago for the same complaint and received a toradol shot. She reports the toradol helped for a short period of time. She has a appointment with urology in 5 days. She reports having stones that needed stenting in the past but she is typically able to pass them by herself. She has taken oxycodone and ibuprofen at home without relief of her pain. Denies fevers, chills, dizziness, syncope, abdominal pain, hematuria or dysuria. Patient has no other complaints today.  Past Medical History  Diagnosis Date  . Hypertension   . Diabetes mellitus   . Gout   . Nephrolithiasis     recurrent  . Osteoarthritis, knee   . GERD (gastroesophageal reflux disease)   . Depression   . Rotator cuff syndrome of left shoulder   . Carpal tunnel syndrome, left   . Anxiety   . DUB (dysfunctional uterine bleeding)   . Allergic rhinitis   . External hemorrhoid   . Morbid obesity ([place])   . Recurrent UTI   . Obesity    Past Surgical History  Procedure Laterality Date  . Lithotripsy    . Replacement total knee bilateral    . Cholecystectomy    . Tubal ligation    . Incision and drainage abscess N/A 06/01/2014    Procedure: INCISION AND DRAINAGE ABSCESS;  Surgeon: Rolm Bookbinder, MD;  [facility];  Service: General;  Laterality: N/A;   Family History  Problem Relation Age of Onset  . Coronary artery  disease    . Hypertension    . Diabetes    . Obesity    . Diabetes Mother   . Hypertension Mother    Social History  Substance Use Topics  . Smoking status: Never Smoker   . Smokeless tobacco: Never Used  . Alcohol Use: No   OB History    Gravida Para Term Preterm AB TAB SAB Ectopic Multiple Living   4         3      Obstetric Comments   Patient had two twin pregnancies.  One Pregnancy resulted in miscarriage completely and second twin pregnancy resulted in one live birth.  She also had a son that was killed in the [employer]     Review of Systems  All other systems reviewed and are negative.     Allergies  Propoxyphene n-acetaminophen  Home Medications   Prior to Admission medications   Medication Sig Start Date End Date Taking? Authorizing Provider  ALPRAZolam Duanne Moron) 1 MG tablet Take 1 mg by mouth 3 (three) times daily as needed for anxiety.  10/18/11   Historical Provider, MD  B-D INS SYR ULTRAFINE 1CC/31G 31G X 5/16" 1 ML MISC  09/23/15   Historical Provider, MD  benazepril (LOTENSIN) 20 MG tablet TAKE 1 TABLET BY MOUTH ONCE A DAY AS DIRECTED 11/07/14  Historical Provider, MD  BREO ELLIPTA 100-25 MCG/INH AEPB INHALE 1 PUFF INTO LUNGS QD 09/11/15   Historical Provider, MD  carisoprodol (SOMA) 350 MG tablet TAKE 1 TABLET BY MOUTH THREE TIMES DAILY 09/15/15   Dickie La, MD  cephALEXin (KEFLEX) 500 MG capsule Take 1 capsule (500 mg total) by mouth 4 (four) times daily. 11/06/14   Veryl Speak, MD  cetirizine (ZYRTEC) 10 MG tablet Take 10 mg by mouth daily. 05/23/14   Historical Provider, MD  colchicine (COLCRYS) 0.6 MG tablet Take 1 tablet (0.6 mg total) by mouth daily. 05/01/15   Dickie La, MD  doxycycline (VIBRA-TABS) 100 MG tablet TAKE 1 TABLET BY MOUTH TWICE A DAY AS DIRECTED 12/04/14   Historical Provider, MD  econazole nitrate 1 % cream APP EXT AA BID 10/03/14   Historical Provider, MD  erythromycin ethylsuccinate (EES) 400 MG tablet  05/30/14   Historical Provider, MD   fluconazole (DIFLUCAN) 150 MG tablet TAKE 1 TABLET BY MOUTH ONCE. TAKE THE SECOND TABLET 3 DAYS LATER 11/24/14   Emily Filbert, MD  fluconazole (DIFLUCAN) 200 MG tablet Take 1 tablet (200 mg total) by mouth once. May repeat in 72 hrs. 07/13/15   Emily Filbert, MD  glipiZIDE (GLUCOTROL XL) 5 MG 24 hr tablet  07/24/14   Historical Provider, MD  glipiZIDE (GLUCOTROL) 5 MG tablet Take 5 mg by mouth 2 (two) times daily.      Historical Provider, MD  hydrochlorothiazide (HYDRODIURIL) 25 MG tablet Take 25 mg by mouth daily.    Historical Provider, MD  HYDROcodone-acetaminophen (NORCO/VICODIN) 5-325 MG per tablet Take 1 tablet by mouth every 4 (four) hours as needed. 07/01/14   Historical Provider, MD  ibuprofen (ADVIL,MOTRIN) 800 MG tablet  09/18/15   Historical Provider, MD  insulin detemir (LEVEMIR) 100 UNIT/ML injection Inject 60 Units into the skin at bedtime.    Historical Provider, MD  Insulin Pen Needle (B-D ULTRAFINE III SHORT PEN) 31G X 8 MM MISC Use as directed once daily with Lantus solostar pen 06/02/10   Nneka Isamah  LEVEMIR FLEXTOUCH 100 UNIT/ML Pen  05/08/14   Historical Provider, MD  lidocaine (XYLOCAINE) 5 % ointment  05/29/14   Historical Provider, MD  metFORMIN (GLUCOPHAGE) 1000 MG tablet Take 1,000 mg by mouth 2 (two) times daily with a meal.     Historical Provider, MD  metoprolol (TOPROL-XL) 200 MG 24 hr tablet Take 200 mg by mouth daily.     Historical Provider, MD  metroNIDAZOLE (FLAGYL) 500 MG tablet Take 1 tablet (500 mg total) by mouth 2 (two) times daily. 07/16/15   Emily Filbert, MD  naproxen (NAPROSYN) 375 MG tablet Take 1 tablet (375 mg total) by mouth 2 (two) times daily. 05/18/15   April Palumbo, MD  omeprazole (PRILOSEC) 20 MG capsule Take 1 capsule (20 mg total) by mouth 2 (two) times daily. Patient not taking: Reported on 05/31/2014 05/12/13   [name], MD  ondansetron [facility] ODT) 4 MG disintegrating tablet 4mg  ODT q4 hours prn nausea/vomit Patient not taking: Reported on 05/31/2014  04/20/14   Elnora Morrison, MD  ondansetron [facility] ODT) 8 MG disintegrating tablet 8mg  ODT q8 hours prn nausea 05/18/15   April Palumbo, MD  oxyCODONE (ROXICODONE) 15 MG immediate release tablet  06/24/14   Historical Provider, MD  Oxycodone HCl 10 MG TABS Take 1.5-2 tablets (15-20 mg total) by mouth every 6 (six) hours as needed. 06/02/14   Shanker Kristeen Mans, MD  oxyCODONE-acetaminophen (PERCOCET) 5-325 MG  per tablet Take 1-2 tablets by mouth every 6 (six) hours as needed. 11/06/14   Veryl Speak, MD  pantoprazole (PROTONIX) 40 MG tablet Take 40 mg by mouth daily.     Historical Provider, MD  pantoprazole (PROTONIX) 40 MG tablet Take 40 mg by mouth daily.    Historical Provider, MD  phenazopyridine (PYRIDIUM) 200 MG tablet TK 1 T PO  TID AS DIRECTED 08/10/15   Historical Provider, MD  phentermine (ADIPEX-P) 37.5 MG tablet TK 1 T PO QD 10/31/14   Historical Provider, MD  pioglitazone (ACTOS) 30 MG tablet Take 30 mg by mouth daily. 09/08/15   Historical Provider, MD  promethazine (PHENERGAN) 25 MG tablet Take 1 tablet (25 mg total) by mouth every 6 (six) hours as needed for nausea or vomiting. 10/08/13   Fredia Sorrow, MD  tamsulosin (FLOMAX) 0.4 MG CAPS capsule  04/24/14   Historical Provider, MD  tamsulosin (FLOMAX) 0.4 MG CAPS capsule Take 1 capsule (0.4 mg total) by mouth daily. 05/18/15   April Palumbo, MD  VIRTUSSIN A/C 100-10 MG/5ML syrup TAKE 5 MLS BY MOUTH AT BEDTIME 06/22/15   Historical Provider, MD   BP 124/81 mmHg  Pulse 62  Temp(Src) 98.4 F (36.9 C) (Axillary)  Resp 20  SpO2 95%  LMP 04/11/2009 Physical Exam  Constitutional: She appears well-developed and well-nourished. No distress.  Morbidly obese. Nontoxic appearing. No acute distress  HENT:  Head: Normocephalic and atraumatic.  Eyes: Conjunctivae are normal. Right eye exhibits no discharge. Left eye exhibits no discharge. No scleral icterus.  Neck: Normal range of motion.  Cardiovascular: Normal rate, regular rhythm and normal heart  sounds.   Pulmonary/Chest: Effort normal and breath sounds normal. No respiratory distress.  Abdominal: Soft. Bowel sounds are normal. She exhibits no distension. There is no tenderness. There is no CVA tenderness.  Abdomen is soft, non-tender. No CVA tenderness of the right flank  Musculoskeletal: Normal range of motion.  Neurological: She is alert. Coordination normal.  Skin: Skin is warm and dry.  No rashes over the right flank  Psychiatric: She has a normal mood and affect. Her behavior is normal.  Nursing note and vitals reviewed.   ED Course  Procedures (including critical care time) Labs Review Labs Reviewed  BASIC METABOLIC PANEL - Abnormal; Notable for the following:    Glucose, Bld 180 (*)    Creatinine, Ser 1.14 (*)    GFR calc non Af Amer 53 (*)    All other components within normal limits  CBC WITH DIFFERENTIAL/PLATELET  URINALYSIS, ROUTINE W REFLEX MICROSCOPIC (NOT AT [facility])    Imaging Review Ct Renal Stone Study  10/24/2015  CLINICAL DATA:  Right flank pain for 3 days EXAM: CT ABDOMEN AND PELVIS WITHOUT CONTRAST TECHNIQUE: Multidetector CT imaging of the abdomen and pelvis was performed following the standard protocol without IV contrast. COMPARISON:  05/18/2015 FINDINGS: Lower chest:  No acute findings. Hepatobiliary: No mass visualized on this un-enhanced exam. Prior cholecystectomy. Pancreas: No mass or inflammatory process identified on this un-enhanced exam. Spleen: Within normal limits in size. Adrenals/Urinary Tract: Stable 19 mm right adrenal mass unchanged compared with 04/23/2012 likely reflecting an adrenal adenoma. Normal left adrenal gland. Normal kidneys. No urolithiasis. No obstructive uropathy. Normal bladder. Stomach/Bowel: No evidence of obstruction, inflammatory process, or abnormal fluid collections. Normal appendix. Vascular/Lymphatic: No pathologically enlarged lymph nodes. No evidence of abdominal aortic aneurysm. Reproductive: No mass or other  significant abnormality. Other: None. Musculoskeletal: No suspicious bone lesions identified. Degenerative disc disease with disc  height loss at L4-5 with bilateral facet arthropathy and bilateral foraminal narrowing. Bilateral facet arthropathy at L5-S1. IMPRESSION: 1. No urolithiasis or obstructive uropathy. Electronically Signed   By: Kathreen Devoid   On: 10/24/2015 14:00   I have personally reviewed and evaluated these images and lab results as part of my medical decision-making.   EKG Interpretation None      MDM   Final diagnoses:  Right flank pain   55 year old female with PMHx of recurrent kidney stones presenting with flank pain and nausea x 2 days. Afebrile and hemodynamically stable. NAD and nontoxic. No abdominal TTP. No CVA tenderness. No rashes. Creatinine appears at pt's baseline. Lab work otherwise reassuring. No infection or Hgb on UA. CT renal stone negative for obstructing stone. Symptoms managed in ED with dilaudid and zofran. Discussed with pt that she possibly already passed the stone or pain is MSK in nature. Pt reports she has not been straining her urine since seeing her PCP two days ago. Encouraged pt to continue her home medication regimen and keep her follow up appt with urology in 5 days. Return precautions given in discharge paperwork and discussed with pt at bedside. Pt stable for discharge     Josephina Gip, PA-C 10/24/15 1504  Davonna Belling, MD 10/24/15 1520

## 2015-10-26 ENCOUNTER — Ambulatory Visit: Payer: Self-pay | Admitting: Urology

## 2015-10-27 DIAGNOSIS — Z01812 Encounter for preprocedural laboratory examination: Secondary | ICD-10-CM | POA: Diagnosis not present

## 2015-10-28 ENCOUNTER — Ambulatory Visit: Payer: Self-pay

## 2015-10-29 ENCOUNTER — Ambulatory Visit: Payer: PPO | Admitting: Skilled Nursing Facility1

## 2015-10-29 DIAGNOSIS — E559 Vitamin D deficiency, unspecified: Secondary | ICD-10-CM | POA: Diagnosis not present

## 2015-10-29 DIAGNOSIS — E782 Mixed hyperlipidemia: Secondary | ICD-10-CM | POA: Diagnosis not present

## 2015-10-29 DIAGNOSIS — D519 Vitamin B12 deficiency anemia, unspecified: Secondary | ICD-10-CM | POA: Diagnosis not present

## 2015-10-29 DIAGNOSIS — E039 Hypothyroidism, unspecified: Secondary | ICD-10-CM | POA: Diagnosis not present

## 2015-11-04 ENCOUNTER — Ambulatory Visit: Payer: PPO | Admitting: Skilled Nursing Facility1

## 2015-11-05 DIAGNOSIS — M059 Rheumatoid arthritis with rheumatoid factor, unspecified: Secondary | ICD-10-CM | POA: Diagnosis not present

## 2015-11-05 DIAGNOSIS — R609 Edema, unspecified: Secondary | ICD-10-CM | POA: Diagnosis not present

## 2015-11-05 DIAGNOSIS — J449 Chronic obstructive pulmonary disease, unspecified: Secondary | ICD-10-CM | POA: Diagnosis not present

## 2015-11-05 DIAGNOSIS — E119 Type 2 diabetes mellitus without complications: Secondary | ICD-10-CM | POA: Diagnosis not present

## 2015-11-06 DIAGNOSIS — R799 Abnormal finding of blood chemistry, unspecified: Secondary | ICD-10-CM | POA: Diagnosis not present

## 2015-11-06 DIAGNOSIS — R609 Edema, unspecified: Secondary | ICD-10-CM | POA: Diagnosis not present

## 2015-11-09 ENCOUNTER — Ambulatory Visit: Payer: PPO | Admitting: Skilled Nursing Facility1

## 2015-11-10 ENCOUNTER — Encounter: Payer: PPO | Attending: Surgery | Admitting: Skilled Nursing Facility1

## 2015-11-10 ENCOUNTER — Encounter: Payer: Self-pay | Admitting: Skilled Nursing Facility1

## 2015-11-10 DIAGNOSIS — E119 Type 2 diabetes mellitus without complications: Secondary | ICD-10-CM | POA: Diagnosis not present

## 2015-11-10 DIAGNOSIS — Z713 Dietary counseling and surveillance: Secondary | ICD-10-CM | POA: Insufficient documentation

## 2015-11-10 NOTE — Progress Notes (Signed)
  Pre-Op Assessment Visit:  Pre-Operative Sleeve Surgery  Medical Nutrition Therapy:  Appt start time: 10:45   End time:  11:45  Patient was seen on 11/10/2015 for Pre-Operative Nutrition Assessment. Assessment and letter of approval faxed to [facility] Bariatric Surgery Program coordinator on December 09, 2015.  Pt states she has a disabled son-torn quad, alcohol addicted sister, the anniversery of her mothers death is 09-Dec-2022, she has had bilateral knee replacements, and also states her back is in constant pain causing her to not be able to walk very long or stand. Pt staets she has completed her SWL with her physician.   Samples given: premeir protein strawberry may 6th, 2018 [id number] unjury chicken soup ex: 08/18 [id number] unjury strawberry sorbet [id number]  Ex: 07/18  Preferred Learning Style:   Auditory  Visual  Hands on  No preference indicated   Learning Readiness:   Ready  Handouts given during visit include:  Pre-Op Goals Bariatric Surgery Protein Shakes  During the appointment today the following Pre-Op Goals were reviewed with the patient: Maintain or lose weight as instructed by your surgeon Make healthy food choices Begin to limit portion sizes Limited concentrated sugars and fried foods Keep fat/sugar in the single digits per serving on   food labels Practice CHEWING your food  (aim for 30 chews per bite or until applesauce consistency) Practice not drinking 15 minutes before, during, and 30 minutes after each meal/snack Avoid all carbonated beverages  Avoid/limit caffeinated beverages  Avoid all sugar-sweetened beverages Consume 3 meals per day; eat every 3-5 hours Make a list of non-food related activities Aim for 64-100 ounces of FLUID daily  Aim for at least 60-80 grams of PROTEIN daily Look for a liquid protein source that contain ?15 g protein and ?5 g carbohydrate  (ex: shakes, drinks, shots)  Patient-Centered Goals: 10/10 specific/non-scale and  confidence/importance scale 1-10  Demonstrated degree of understanding via:  Teach Back  Teaching Method Utilized: Visual Auditory Hands on  Barriers to learning/adherence to lifestyle change: very tough family life  Patient to call the [facility] to enroll in Pre-Op and Post-Op Nutrition Education when surgery date is scheduled.

## 2015-11-11 ENCOUNTER — Other Ambulatory Visit: Payer: Self-pay

## 2015-11-11 ENCOUNTER — Ambulatory Visit (HOSPITAL_COMMUNITY)
Admission: RE | Admit: 2015-11-11 | Discharge: 2015-11-11 | Disposition: A | Discharge status: Home / Self Care | Payer: PPO | Source: Ambulatory Visit | Attending: Surgery | Admitting: Surgery

## 2015-11-11 DIAGNOSIS — K449 Diaphragmatic hernia without obstruction or gangrene: Secondary | ICD-10-CM | POA: Insufficient documentation

## 2015-11-12 ENCOUNTER — Ambulatory Visit (INDEPENDENT_AMBULATORY_CARE_PROVIDER_SITE_OTHER): Payer: PPO | Admitting: Psychiatry

## 2015-11-17 DIAGNOSIS — R609 Edema, unspecified: Secondary | ICD-10-CM | POA: Diagnosis not present

## 2015-11-17 DIAGNOSIS — B379 Candidiasis, unspecified: Secondary | ICD-10-CM | POA: Diagnosis not present

## 2015-11-18 ENCOUNTER — Ambulatory Visit (INDEPENDENT_AMBULATORY_CARE_PROVIDER_SITE_OTHER): Payer: PPO | Admitting: Psychiatry

## 2015-11-23 ENCOUNTER — Encounter (HOSPITAL_BASED_OUTPATIENT_CLINIC_OR_DEPARTMENT_OTHER): Payer: Self-pay | Admitting: *Deleted

## 2015-11-23 ENCOUNTER — Emergency Department (HOSPITAL_BASED_OUTPATIENT_CLINIC_OR_DEPARTMENT_OTHER): Payer: PPO

## 2015-11-23 ENCOUNTER — Emergency Department (HOSPITAL_BASED_OUTPATIENT_CLINIC_OR_DEPARTMENT_OTHER)
Admission: EM | Admit: 2015-11-23 | Discharge: 2015-11-23 | Disposition: A | Discharge status: Home / Self Care | Payer: PPO | Attending: Emergency Medicine | Admitting: Emergency Medicine

## 2015-11-23 ENCOUNTER — Ambulatory Visit: Payer: PPO | Admitting: Obstetrics and Gynecology

## 2015-11-23 DIAGNOSIS — Z79899 Other long term (current) drug therapy: Secondary | ICD-10-CM | POA: Diagnosis not present

## 2015-11-23 DIAGNOSIS — Y929 Unspecified place or not applicable: Secondary | ICD-10-CM | POA: Diagnosis not present

## 2015-11-23 DIAGNOSIS — Z7984 Long term (current) use of oral hypoglycemic drugs: Secondary | ICD-10-CM | POA: Diagnosis not present

## 2015-11-23 DIAGNOSIS — W208XXA Other cause of strike by thrown, projected or falling object, initial encounter: Secondary | ICD-10-CM | POA: Diagnosis not present

## 2015-11-23 DIAGNOSIS — Y939 Activity, unspecified: Secondary | ICD-10-CM | POA: Insufficient documentation

## 2015-11-23 DIAGNOSIS — S91115A Laceration without foreign body of left lesser toe(s) without damage to nail, initial encounter: Secondary | ICD-10-CM | POA: Diagnosis not present

## 2015-11-23 DIAGNOSIS — I10 Essential (primary) hypertension: Secondary | ICD-10-CM | POA: Insufficient documentation

## 2015-11-23 DIAGNOSIS — Y999 Unspecified external cause status: Secondary | ICD-10-CM | POA: Insufficient documentation

## 2015-11-23 DIAGNOSIS — M7989 Other specified soft tissue disorders: Secondary | ICD-10-CM | POA: Diagnosis not present

## 2015-11-23 DIAGNOSIS — E119 Type 2 diabetes mellitus without complications: Secondary | ICD-10-CM | POA: Diagnosis not present

## 2015-11-23 DIAGNOSIS — S99922A Unspecified injury of left foot, initial encounter: Secondary | ICD-10-CM | POA: Diagnosis not present

## 2015-11-23 DIAGNOSIS — S9032XA Contusion of left foot, initial encounter: Secondary | ICD-10-CM | POA: Diagnosis not present

## 2015-11-23 DIAGNOSIS — N760 Acute vaginitis: Secondary | ICD-10-CM

## 2015-11-23 DIAGNOSIS — S91119A Laceration without foreign body of unspecified toe without damage to nail, initial encounter: Secondary | ICD-10-CM

## 2015-11-23 DIAGNOSIS — Z794 Long term (current) use of insulin: Secondary | ICD-10-CM | POA: Insufficient documentation

## 2015-11-23 NOTE — ED Triage Notes (Signed)
Metal crutch fell on her left foot last night. She has a small laceration across her 2nd toe. Bleeding controlled. Swelling noted to her foot.

## 2015-11-23 NOTE — Discharge Instructions (Signed)
Keep your foot clean and dry. Apply bacitracin twice a day to the laceration. Keep it elevated. Ice. Post op shoe as needed. Follow up with your doctor if not improving or worsening.

## 2015-11-23 NOTE — ED Provider Notes (Signed)
[facility] Provider Note   CSN: [id number] Arrival date & time: 11/23/15  1300     History   Chief Complaint Chief Complaint  Patient presents with  . Foot Injury    HPI Maria [name] is a 55 y.o. female.  HPI Maria [name] is a 55 y.o. female who presents to emergency department complaining of left foot injury. Patient states she dropped a pair of large size metal crutches onto her left foot. She sustained a laceration to the third toe. States today unable to put weight on that foot. She did not wash or cleaned out her laceration. She denies any other injury. No numbness or weakness to the foot or toes. Did not take anything prior to coming in.  No other complaints  Past Medical History:  Diagnosis Date  . Allergic rhinitis   . Anxiety   . Carpal tunnel syndrome, left   . Depression   . Diabetes mellitus   . DUB (dysfunctional uterine bleeding)   . External hemorrhoid   . GERD (gastroesophageal reflux disease)   . Gout   . Hypertension   . Morbid obesity ([place])   . Nephrolithiasis    recurrent  . Obesity   . Osteoarthritis, knee   . Recurrent UTI   . Rotator cuff syndrome of left shoulder     Patient Active Problem List   Diagnosis Date Noted  . Osteoarthritis of left hip 08/22/2014  . Cellulitis and abscess 05/31/2014  . Greater trochanteric bursitis of left hip 04/01/2014  . Radicular pain in left arm 09/27/2013  . Left shoulder pain 03/28/2013  . Rotator cuff syndrome 06/29/2011  . S/P TKR (total knee replacement) 01/17/2011  . Lateral epicondylitis 01/17/2011  . Preoperative clearance 07/15/2010  . Vaginal odor 06/01/2010  . Nasal congestion 06/01/2010  . Osteoarthrosis, unspecified whether generalized or localized, involving lower leg 12/11/2009  . GOUT, UNSPECIFIED 10/09/2009  . HEMORRHOIDS, EXTERNAL 06/17/2009  . Depression 04/23/2009  . NEPHROLITHIASIS, RECURRENT 04/23/2009  . CARPAL TUNNEL SYNDROME, LEFT 01/26/2009  . ROTATOR CUFF  SYNDROME, LEFT 09/19/2008  . GERD 02/25/2008  . Recurrent UTI 05/02/2007  . Diabetes type 2, uncontrolled ([place]) 04/26/2006  . Hyperlipidemia 04/26/2006  . ANXIETY STATE NOS 04/26/2006  . Obstructive sleep apnea 04/26/2006  . MORBID OBESITY 01/11/2006  . Essential hypertension 01/11/2006  . ALLERGIC RHINITIS 01/11/2006  . DYSFUNCTIONAL UTERINE BLEEDING 01/11/2006    Past Surgical History:  Procedure Laterality Date  . CHOLECYSTECTOMY    . INCISION AND DRAINAGE ABSCESS N/A 06/01/2014   Procedure: INCISION AND DRAINAGE ABSCESS;  Surgeon: Rolm Bookbinder, MD;  Location: [facility];  Service: General;  Laterality: N/A;  . LITHOTRIPSY    . REPLACEMENT TOTAL KNEE BILATERAL    . TUBAL LIGATION      OB History    Gravida Para Term Preterm AB Living   4         3   SAB TAB Ectopic Multiple Live Births                  Obstetric Comments   Patient had two twin pregnancies.  One Pregnancy resulted in miscarriage completely and second twin pregnancy resulted in one live birth.  She also had a son that was killed in the [employer]       Home Medications    Prior to Admission medications   Medication Sig Start Date End Date Taking? Authorizing Provider  ALPRAZolam Duanne Moron) 1 MG tablet Take 1 mg by mouth 3 (three)  times daily as needed for anxiety.  10/18/11   Historical Provider, MD  B-D INS SYR ULTRAFINE 1CC/31G 31G X 5/16" 1 ML MISC  09/23/15   Historical Provider, MD  benazepril (LOTENSIN) 20 MG tablet TAKE 1 TABLET BY MOUTH ONCE A DAY AS DIRECTED 11/07/14   Historical Provider, MD  BREO ELLIPTA 100-25 MCG/INH AEPB INHALE 1 PUFF INTO LUNGS QD 09/11/15   Historical Provider, MD  carisoprodol (SOMA) 350 MG tablet TAKE 1 TABLET BY MOUTH THREE TIMES DAILY 09/15/15   Dickie La, MD  cephALEXin (KEFLEX) 500 MG capsule Take 1 capsule (500 mg total) by mouth 4 (four) times daily. 11/06/14   Veryl Speak, MD  cetirizine (ZYRTEC) 10 MG tablet Take 10 mg by mouth daily. 05/23/14   Historical Provider, MD    colchicine (COLCRYS) 0.6 MG tablet Take 1 tablet (0.6 mg total) by mouth daily. 05/01/15   Dickie La, MD  doxycycline (VIBRA-TABS) 100 MG tablet TAKE 1 TABLET BY MOUTH TWICE A DAY AS DIRECTED 12/04/14   Historical Provider, MD  econazole nitrate 1 % cream APP EXT AA BID 10/03/14   Historical Provider, MD  erythromycin ethylsuccinate (EES) 400 MG tablet  05/30/14   Historical Provider, MD  fluconazole (DIFLUCAN) 150 MG tablet TAKE 1 TABLET BY MOUTH ONCE. TAKE THE SECOND TABLET 3 DAYS LATER 11/24/14   Emily Filbert, MD  fluconazole (DIFLUCAN) 200 MG tablet Take 1 tablet (200 mg total) by mouth once. May repeat in 72 hrs. 07/13/15   Emily Filbert, MD  glipiZIDE (GLUCOTROL XL) 5 MG 24 hr tablet  07/24/14   Historical Provider, MD  glipiZIDE (GLUCOTROL) 5 MG tablet Take 5 mg by mouth 2 (two) times daily.      Historical Provider, MD  hydrochlorothiazide (HYDRODIURIL) 25 MG tablet Take 25 mg by mouth daily.    Historical Provider, MD  HYDROcodone-acetaminophen (NORCO/VICODIN) 5-325 MG per tablet Take 1 tablet by mouth every 4 (four) hours as needed. 07/01/14   Historical Provider, MD  ibuprofen (ADVIL,MOTRIN) 800 MG tablet  09/18/15   Historical Provider, MD  insulin detemir (LEVEMIR) 100 UNIT/ML injection Inject 60 Units into the skin at bedtime.    Historical Provider, MD  Insulin Pen Needle (B-D ULTRAFINE III SHORT PEN) 31G X 8 MM MISC Use as directed once daily with Lantus solostar pen 06/02/10   Nneka Isamah  LEVEMIR FLEXTOUCH 100 UNIT/ML Pen  05/08/14   Historical Provider, MD  lidocaine (XYLOCAINE) 5 % ointment  05/29/14   Historical Provider, MD  metFORMIN (GLUCOPHAGE) 1000 MG tablet Take 1,000 mg by mouth 2 (two) times daily with a meal.     Historical Provider, MD  metoprolol (TOPROL-XL) 200 MG 24 hr tablet Take 200 mg by mouth daily.     Historical Provider, MD  metroNIDAZOLE (FLAGYL) 500 MG tablet Take 1 tablet (500 mg total) by mouth 2 (two) times daily. 07/16/15   Emily Filbert, MD  naproxen (NAPROSYN) 375  MG tablet Take 1 tablet (375 mg total) by mouth 2 (two) times daily. 05/18/15   April Palumbo, MD  omeprazole (PRILOSEC) 20 MG capsule Take 1 capsule (20 mg total) by mouth 2 (two) times daily. Patient not taking: Reported on 05/31/2014 05/12/13   [name], MD  ondansetron [facility] ODT) 4 MG disintegrating tablet 4mg  ODT q4 hours prn nausea/vomit Patient not taking: Reported on 05/31/2014 04/20/14   Elnora Morrison, MD  ondansetron [facility] ODT) 8 MG disintegrating tablet 8mg  ODT q8 hours prn nausea 05/18/15  April Palumbo, MD  oxyCODONE (ROXICODONE) 15 MG immediate release tablet  06/24/14   Historical Provider, MD  Oxycodone HCl 10 MG TABS Take 1.5-2 tablets (15-20 mg total) by mouth every 6 (six) hours as needed. 06/02/14   Shanker Kristeen Mans, MD  oxyCODONE-acetaminophen (PERCOCET) 5-325 MG per tablet Take 1-2 tablets by mouth every 6 (six) hours as needed. 11/06/14   Veryl Speak, MD  pantoprazole (PROTONIX) 40 MG tablet Take 40 mg by mouth daily.     Historical Provider, MD  pantoprazole (PROTONIX) 40 MG tablet Take 40 mg by mouth daily.    Historical Provider, MD  phenazopyridine (PYRIDIUM) 200 MG tablet TK 1 T PO  TID AS DIRECTED 08/10/15   Historical Provider, MD  phentermine (ADIPEX-P) 37.5 MG tablet TK 1 T PO QD 10/31/14   Historical Provider, MD  pioglitazone (ACTOS) 30 MG tablet Take 30 mg by mouth daily. 09/08/15   Historical Provider, MD  promethazine (PHENERGAN) 25 MG tablet Take 1 tablet (25 mg total) by mouth every 6 (six) hours as needed for nausea or vomiting. 10/08/13   Fredia Sorrow, MD  tamsulosin (FLOMAX) 0.4 MG CAPS capsule  04/24/14   Historical Provider, MD  tamsulosin (FLOMAX) 0.4 MG CAPS capsule Take 1 capsule (0.4 mg total) by mouth daily. 05/18/15   April Palumbo, MD  VIRTUSSIN A/C 100-10 MG/5ML syrup TAKE 5 MLS BY MOUTH AT BEDTIME 06/22/15   Historical Provider, MD    Family History Family History  Problem Relation Age of Onset  . Coronary artery disease    . Hypertension    .  Diabetes    . Obesity    . Diabetes Mother   . Hypertension Mother     Social History Social History  Substance Use Topics  . Smoking status: Never Smoker  . Smokeless tobacco: Never Used  . Alcohol use No     Allergies   Propoxyphene n-acetaminophen   Review of Systems Review of Systems  Constitutional: Negative for chills and fever.  Respiratory: Negative for cough, chest tightness and shortness of breath.   Cardiovascular: Negative for chest pain, palpitations and leg swelling.  Genitourinary: Negative for dysuria, flank pain and pelvic pain.  Musculoskeletal: Positive for arthralgias and joint swelling. Negative for myalgias, neck pain and neck stiffness.  Skin: Positive for wound. Negative for rash.  Neurological: Negative for dizziness, weakness and headaches.  All other systems reviewed and are negative.    Physical Exam Updated Vital Signs BP 129/92 (BP Location: Right Arm)   Pulse 78   Temp 97.6 F (36.4 C) (Oral)   Resp 20   Ht 5' 8.5" (1.74 m)   Wt (!) 184.6 kg   LMP 04/11/2009   SpO2 97%   BMI 60.98 kg/m   Physical Exam  Constitutional: She appears well-developed and well-nourished. No distress.  HENT:  Head: Normocephalic.  Eyes: Conjunctivae are normal.  Neck: Neck supple.  Cardiovascular: Normal rate, regular rhythm and normal heart sounds.   Pulmonary/Chest: Effort normal and breath sounds normal. No respiratory distress. She has no wheezes. She has no rales.  Musculoskeletal: She exhibits no edema.  Small swelling noted to the left 3rd toe with 1cm superficial lac to the dorsal ip joint. Pain with ROM of 3rd and 4th toes. No other abnormalities seen.   Neurological: She is alert.  Skin: Skin is warm and dry. Capillary refill takes less than 2 seconds.  Psychiatric: She has a normal mood and affect. Her behavior is normal.  Nursing  note and vitals reviewed.    ED Treatments / Results  Labs (all labs ordered are listed, but only abnormal  results are displayed) Labs Reviewed - No data to display  EKG  EKG Interpretation None       Radiology Dg Foot Complete Left  Result Date: 11/23/2015 CLINICAL DATA:  Injury. EXAM: LEFT FOOT - COMPLETE 3+ VIEW COMPARISON:  11/09/2010. FINDINGS: No acute bony or joint abnormality identified. No evidence of fracture or dislocation. Diffuse soft tissue swelling. Soft tissue swelling is prominent. IMPRESSION: Diffuse prominent soft tissue swelling.  No acute bony abnormality. Electronically Signed   By: Marcello Moores  Register   On: 11/23/2015 13:46    Procedures Procedures (including critical care time)  Medications Ordered in ED Medications - No data to display   Initial Impression / Assessment and Plan / ED Course  I have reviewed the triage vital signs and the nursing notes.  Pertinent labs & imaging results that were available during my care of the patient were reviewed by me and considered in my medical decision making (see chart for details).  Clinical Course   Patient emergency department with crush injury to the left third toe. Small superficial laceration noted. Incident occurred yesterday. I do not think he needs any repair or sutures. [place] soap and water, soaked in iodine water. X-ray negative. Given postop shoe. Home with close follow-up.   Final Clinical Impressions(s) / ED Diagnoses   Final diagnoses:  Foot contusion, left, initial encounter  Laceration of toe, initial encounter    New Prescriptions Discharge Medication List as of 11/23/2015  3:21 PM       Jeannett Senior, PA-C 11/23/15 [place], MD 11/24/15 [phone]

## 2015-11-30 DIAGNOSIS — J020 Streptococcal pharyngitis: Secondary | ICD-10-CM | POA: Diagnosis not present

## 2015-11-30 DIAGNOSIS — J3489 Other specified disorders of nose and nasal sinuses: Secondary | ICD-10-CM | POA: Diagnosis not present

## 2015-12-08 DIAGNOSIS — R52 Pain, unspecified: Secondary | ICD-10-CM | POA: Diagnosis not present

## 2015-12-08 DIAGNOSIS — R609 Edema, unspecified: Secondary | ICD-10-CM | POA: Diagnosis not present

## 2015-12-13 ENCOUNTER — Encounter (HOSPITAL_COMMUNITY): Payer: Self-pay | Admitting: *Deleted

## 2015-12-13 DIAGNOSIS — N23 Unspecified renal colic: Secondary | ICD-10-CM | POA: Diagnosis not present

## 2015-12-13 DIAGNOSIS — Z7984 Long term (current) use of oral hypoglycemic drugs: Secondary | ICD-10-CM | POA: Diagnosis not present

## 2015-12-13 DIAGNOSIS — E119 Type 2 diabetes mellitus without complications: Secondary | ICD-10-CM | POA: Insufficient documentation

## 2015-12-13 DIAGNOSIS — Z794 Long term (current) use of insulin: Secondary | ICD-10-CM | POA: Insufficient documentation

## 2015-12-13 DIAGNOSIS — R109 Unspecified abdominal pain: Secondary | ICD-10-CM | POA: Diagnosis not present

## 2015-12-13 DIAGNOSIS — Z79899 Other long term (current) drug therapy: Secondary | ICD-10-CM | POA: Diagnosis not present

## 2015-12-13 DIAGNOSIS — Z96653 Presence of artificial knee joint, bilateral: Secondary | ICD-10-CM | POA: Insufficient documentation

## 2015-12-13 DIAGNOSIS — I10 Essential (primary) hypertension: Secondary | ICD-10-CM | POA: Diagnosis not present

## 2015-12-13 DIAGNOSIS — R112 Nausea with vomiting, unspecified: Secondary | ICD-10-CM | POA: Diagnosis not present

## 2015-12-13 LAB — CBC
HCT: 43.4 % (ref 36.0–46.0)
Hemoglobin: 13.7 g/dL (ref 12.0–15.0)
MCH: 28.5 pg (ref 26.0–34.0)
MCHC: 31.6 g/dL (ref 30.0–36.0)
MCV: 90.2 fL (ref 78.0–100.0)
Platelets: 275 10*3/uL (ref 150–400)
RBC: 4.81 MIL/uL (ref 3.87–5.11)
RDW: 14.9 % (ref 11.5–15.5)
WBC: 6.4 10*3/uL (ref 4.0–10.5)

## 2015-12-13 NOTE — ED Triage Notes (Signed)
The pt is c/o rt flank pain since 1500 today  She has a hx of kidney stones.  She has had some  Feeling of not being able to empty her bladder  lmp none

## 2015-12-14 ENCOUNTER — Emergency Department (HOSPITAL_COMMUNITY): Payer: PPO

## 2015-12-14 ENCOUNTER — Emergency Department (HOSPITAL_COMMUNITY)
Admission: EM | Admit: 2015-12-14 | Discharge: 2015-12-14 | Disposition: A | Discharge status: Home / Self Care | Payer: PPO | Attending: Emergency Medicine | Admitting: Emergency Medicine

## 2015-12-14 DIAGNOSIS — N23 Unspecified renal colic: Secondary | ICD-10-CM

## 2015-12-14 DIAGNOSIS — R109 Unspecified abdominal pain: Secondary | ICD-10-CM | POA: Diagnosis not present

## 2015-12-14 LAB — COMPREHENSIVE METABOLIC PANEL
ALT: 21 U/L (ref 14–54)
AST: 14 U/L — ABNORMAL LOW (ref 15–41)
Albumin: 3.5 g/dL (ref 3.5–5.0)
Alkaline Phosphatase: 79 U/L (ref 38–126)
Anion gap: 7 (ref 5–15)
BUN: 22 mg/dL — ABNORMAL HIGH (ref 6–20)
CO2: 27 mmol/L (ref 22–32)
Calcium: 9.2 mg/dL (ref 8.9–10.3)
Chloride: 103 mmol/L (ref 101–111)
Creatinine, Ser: 1.44 mg/dL — ABNORMAL HIGH (ref 0.44–1.00)
GFR calc Af Amer: 46 mL/min — ABNORMAL LOW (ref >60)
GFR calc non Af Amer: 40 mL/min — ABNORMAL LOW (ref >60)
Glucose, Bld: 269 mg/dL — ABNORMAL HIGH (ref 65–99)
Potassium: 4.4 mmol/L (ref 3.5–5.1)
Sodium: 137 mmol/L (ref 135–145)
Total Bilirubin: 0.4 mg/dL (ref 0.3–1.2)
Total Protein: 6.6 g/dL (ref 6.5–8.1)

## 2015-12-14 LAB — URINALYSIS, ROUTINE W REFLEX MICROSCOPIC
Bilirubin Urine: NEGATIVE
Glucose, UA: 500 mg/dL — AB
Hgb urine dipstick: NEGATIVE
Ketones, ur: NEGATIVE mg/dL
Leukocytes, UA: NEGATIVE
Nitrite: NEGATIVE
Protein, ur: NEGATIVE mg/dL
Specific Gravity, Urine: 1.023 (ref 1.005–1.030)
pH: 6 (ref 5.0–8.0)

## 2015-12-14 LAB — LIPASE, BLOOD: Lipase: 30 U/L (ref 11–51)

## 2015-12-14 MED ORDER — OXYCODONE-ACETAMINOPHEN 5-325 MG PO TABS: 1.0000 | ORAL_TABLET | Freq: Four times a day (QID) | ORAL | 0 refills | Status: DC | PRN

## 2015-12-14 MED ORDER — ONDANSETRON HCL 4 MG/2ML IJ SOLN
4.0000 mg | Freq: Once | INTRAMUSCULAR | Status: AC
  Administered 2015-12-14: 4 mg via INTRAVENOUS
  Filled 2015-12-14: qty 2

## 2015-12-14 MED ORDER — KETOROLAC TROMETHAMINE 30 MG/ML IJ SOLN
30.0000 mg | Freq: Once | INTRAMUSCULAR | Status: AC
  Administered 2015-12-14: 30 mg via INTRAVENOUS
  Filled 2015-12-14: qty 1

## 2015-12-14 MED ORDER — HYDROMORPHONE HCL 1 MG/ML IJ SOLN
1.0000 mg | Freq: Once | INTRAMUSCULAR | Status: AC
  Administered 2015-12-14: 1 mg via INTRAVENOUS
  Filled 2015-12-14: qty 1

## 2015-12-14 MED ORDER — HYDROMORPHONE HCL 1 MG/ML IJ SOLN
1.0000 mg | Freq: Once | INTRAMUSCULAR | Status: AC
  Administered 2015-12-14: 1 mg via INTRAMUSCULAR
  Filled 2015-12-14: qty 1

## 2015-12-14 MED ORDER — ONDANSETRON 8 MG PO TBDP: 8.0000 mg | ORAL_TABLET | Freq: Three times a day (TID) | ORAL | 0 refills | Status: DC | PRN

## 2015-12-14 MED ORDER — SODIUM CHLORIDE 0.9 % IV BOLUS (SEPSIS)
1000.0000 mL | Freq: Once | INTRAVENOUS | Status: AC
Start: 2015-12-14 — End: 2015-12-14
  Administered 2015-12-14: 1000 mL via INTRAVENOUS

## 2015-12-14 NOTE — ED Notes (Addendum)
This RN went to the lobby to get the pt. Pt was observed glaring at the front desk. The pt did not respond to her name when called. NT at front desk inform RN which pt it was. This RN   Verified the pt and rolled her into the room in the wheelchair that she was sitting in. This pt made a statement to the RN. This RN requested the pt to repeat herself. The pt responded "i want to speak the supervisor that is over this damn place because this shit is ridiculous. That lady was just sitting up there talking to a man and she didn't do anything to help me. This other lady complained and they took her right back. I'm sitting up here in pain. I haven't came to this place in 6 years and this is why." Pt tearful during interaction. Pt informed that this RN would get her into her room and then inform the Charge RN. Agricultural consultant notified.  This Rn requested that the pt change into the hospital gown and that she could leave her pants on. After the Charge RN was informed this RN returned to the room with the pt daughter, the pt remained in her home clothing. As this RN walked into the room the pt stated to her daughter "get the supervision in this room." The pt was reminded that this RN had just spoke with her as well as the Charge RN about the current situation and that he would be in to talk to her shortly.   This RN attempted to assess the pt. The pt did not respond. The pt daughter began to speak for the pt. This RN read the triage note and the pt made a "psht" sound. This RN then asked the pt if she had any painful or burning when she urinated and the pt did not respond. This RN called the pt by her last name and she responded "I am not talking to you." This RN responded okay and removed herself from the situation. Charge RN updated on situation.

## 2015-12-14 NOTE — ED Provider Notes (Signed)
[facility] DEPT Provider Note   CSN: [id number] Arrival date & time: 12/13/15  2258     History   Chief Complaint Chief Complaint  Patient presents with  . Flank Pain    HPI Maria [name] is a 55 y.o. female.  Patient with a history of kidney stones presents with sudden right flank pain that radiates into RLQ abdomen x 1 day. No fever, hematuria, dysuria. She has experienced nausea with vomiting. No diarrhea. She reports current symptoms feel like previous stones.    The history is provided by the patient and a relative. No language interpreter was used.  Flank Pain  This is a recurrent problem. The problem occurs constantly. The problem has been gradually worsening. Associated symptoms include abdominal pain. Pertinent negatives include no chest pain and no shortness of breath.    Past Medical History:  Diagnosis Date  . Allergic rhinitis   . Anxiety   . Carpal tunnel syndrome, left   . Depression   . Diabetes mellitus   . DUB (dysfunctional uterine bleeding)   . External hemorrhoid   . GERD (gastroesophageal reflux disease)   . Gout   . Hypertension   . Morbid obesity ([place])   . Nephrolithiasis    recurrent  . Obesity   . Osteoarthritis, knee   . Recurrent UTI   . Rotator cuff syndrome of left shoulder     Patient Active Problem List   Diagnosis Date Noted  . Osteoarthritis of left hip 08/22/2014  . Cellulitis and abscess 05/31/2014  . Greater trochanteric bursitis of left hip 04/01/2014  . Radicular pain in left arm 09/27/2013  . Left shoulder pain 03/28/2013  . Rotator cuff syndrome 06/29/2011  . S/P TKR (total knee replacement) 01/17/2011  . Lateral epicondylitis 01/17/2011  . Preoperative clearance 07/15/2010  . Vaginal odor 06/01/2010  . Nasal congestion 06/01/2010  . Osteoarthrosis, unspecified whether generalized or localized, involving lower leg 12/11/2009  . GOUT, UNSPECIFIED 10/09/2009  . HEMORRHOIDS, EXTERNAL 06/17/2009  . Depression  04/23/2009  . NEPHROLITHIASIS, RECURRENT 04/23/2009  . CARPAL TUNNEL SYNDROME, LEFT 01/26/2009  . ROTATOR CUFF SYNDROME, LEFT 09/19/2008  . GERD 02/25/2008  . Recurrent UTI 05/02/2007  . Diabetes type 2, uncontrolled ([place]) 04/26/2006  . Hyperlipidemia 04/26/2006  . ANXIETY STATE NOS 04/26/2006  . Obstructive sleep apnea 04/26/2006  . MORBID OBESITY 01/11/2006  . Essential hypertension 01/11/2006  . ALLERGIC RHINITIS 01/11/2006  . DYSFUNCTIONAL UTERINE BLEEDING 01/11/2006    Past Surgical History:  Procedure Laterality Date  . CHOLECYSTECTOMY    . INCISION AND DRAINAGE ABSCESS N/A 06/01/2014   Procedure: INCISION AND DRAINAGE ABSCESS;  Surgeon: Rolm Bookbinder, MD;  Location: [facility];  Service: General;  Laterality: N/A;  . LITHOTRIPSY    . REPLACEMENT TOTAL KNEE BILATERAL    . TUBAL LIGATION      OB History    Gravida Para Term Preterm AB Living   4         3   SAB TAB Ectopic Multiple Live Births                  Obstetric Comments   Patient had two twin pregnancies.  One Pregnancy resulted in miscarriage completely and second twin pregnancy resulted in one live birth.  She also had a son that was killed in the [employer]       Home Medications    Prior to Admission medications   Medication Sig Start Date End Date Taking? Authorizing Provider  ALPRAZolam Duanne Moron)  1 MG tablet Take 1 mg by mouth 3 (three) times daily as needed for anxiety.  10/18/11   Historical Provider, MD  B-D INS SYR ULTRAFINE 1CC/31G 31G X 5/16" 1 ML MISC  09/23/15   Historical Provider, MD  benazepril (LOTENSIN) 20 MG tablet TAKE 1 TABLET BY MOUTH ONCE A DAY AS DIRECTED 11/07/14   Historical Provider, MD  BREO ELLIPTA 100-25 MCG/INH AEPB INHALE 1 PUFF INTO LUNGS QD 09/11/15   Historical Provider, MD  carisoprodol (SOMA) 350 MG tablet TAKE 1 TABLET BY MOUTH THREE TIMES DAILY 09/15/15   Dickie La, MD  cephALEXin (KEFLEX) 500 MG capsule Take 1 capsule (500 mg total) by mouth 4 (four) times daily. 11/06/14    Veryl Speak, MD  cetirizine (ZYRTEC) 10 MG tablet Take 10 mg by mouth daily. 05/23/14   Historical Provider, MD  colchicine (COLCRYS) 0.6 MG tablet Take 1 tablet (0.6 mg total) by mouth daily. 05/01/15   Dickie La, MD  doxycycline (VIBRA-TABS) 100 MG tablet TAKE 1 TABLET BY MOUTH TWICE A DAY AS DIRECTED 12/04/14   Historical Provider, MD  econazole nitrate 1 % cream APP EXT AA BID 10/03/14   Historical Provider, MD  erythromycin ethylsuccinate (EES) 400 MG tablet  05/30/14   Historical Provider, MD  fluconazole (DIFLUCAN) 150 MG tablet TAKE 1 TABLET BY MOUTH ONCE. TAKE THE SECOND TABLET 3 DAYS LATER 11/24/14   Emily Filbert, MD  fluconazole (DIFLUCAN) 200 MG tablet Take 1 tablet (200 mg total) by mouth once. May repeat in 72 hrs. 07/13/15   Emily Filbert, MD  glipiZIDE (GLUCOTROL XL) 5 MG 24 hr tablet  07/24/14   Historical Provider, MD  glipiZIDE (GLUCOTROL) 5 MG tablet Take 5 mg by mouth 2 (two) times daily.      Historical Provider, MD  hydrochlorothiazide (HYDRODIURIL) 25 MG tablet Take 25 mg by mouth daily.    Historical Provider, MD  HYDROcodone-acetaminophen (NORCO/VICODIN) 5-325 MG per tablet Take 1 tablet by mouth every 4 (four) hours as needed. 07/01/14   Historical Provider, MD  ibuprofen (ADVIL,MOTRIN) 800 MG tablet  09/18/15   Historical Provider, MD  insulin detemir (LEVEMIR) 100 UNIT/ML injection Inject 60 Units into the skin at bedtime.    Historical Provider, MD  Insulin Pen Needle (B-D ULTRAFINE III SHORT PEN) 31G X 8 MM MISC Use as directed once daily with Lantus solostar pen 06/02/10   Nneka Isamah  LEVEMIR FLEXTOUCH 100 UNIT/ML Pen  05/08/14   Historical Provider, MD  lidocaine (XYLOCAINE) 5 % ointment  05/29/14   Historical Provider, MD  metFORMIN (GLUCOPHAGE) 1000 MG tablet Take 1,000 mg by mouth 2 (two) times daily with a meal.     Historical Provider, MD  metoprolol (TOPROL-XL) 200 MG 24 hr tablet Take 200 mg by mouth daily.     Historical Provider, MD  metroNIDAZOLE (FLAGYL) 500 MG  tablet Take 1 tablet (500 mg total) by mouth 2 (two) times daily. 07/16/15   Emily Filbert, MD  naproxen (NAPROSYN) 375 MG tablet Take 1 tablet (375 mg total) by mouth 2 (two) times daily. 05/18/15   April Palumbo, MD  omeprazole (PRILOSEC) 20 MG capsule Take 1 capsule (20 mg total) by mouth 2 (two) times daily. Patient not taking: Reported on 05/31/2014 05/12/13   [name], MD  ondansetron [facility] ODT) 4 MG disintegrating tablet 4mg  ODT q4 hours prn nausea/vomit Patient not taking: Reported on 05/31/2014 04/20/14   Elnora Morrison, MD  ondansetron (ZOFRAN ODT) 8 MG  disintegrating tablet 8mg  ODT q8 hours prn nausea 05/18/15   April Palumbo, MD  oxyCODONE (ROXICODONE) 15 MG immediate release tablet  06/24/14   Historical Provider, MD  Oxycodone HCl 10 MG TABS Take 1.5-2 tablets (15-20 mg total) by mouth every 6 (six) hours as needed. 06/02/14   Shanker Kristeen Mans, MD  oxyCODONE-acetaminophen (PERCOCET) 5-325 MG per tablet Take 1-2 tablets by mouth every 6 (six) hours as needed. 11/06/14   Veryl Speak, MD  pantoprazole (PROTONIX) 40 MG tablet Take 40 mg by mouth daily.     Historical Provider, MD  pantoprazole (PROTONIX) 40 MG tablet Take 40 mg by mouth daily.    Historical Provider, MD  phenazopyridine (PYRIDIUM) 200 MG tablet TK 1 T PO  TID AS DIRECTED 08/10/15   Historical Provider, MD  phentermine (ADIPEX-P) 37.5 MG tablet TK 1 T PO QD 10/31/14   Historical Provider, MD  pioglitazone (ACTOS) 30 MG tablet Take 30 mg by mouth daily. 09/08/15   Historical Provider, MD  promethazine (PHENERGAN) 25 MG tablet Take 1 tablet (25 mg total) by mouth every 6 (six) hours as needed for nausea or vomiting. 10/08/13   Fredia Sorrow, MD  tamsulosin (FLOMAX) 0.4 MG CAPS capsule  04/24/14   Historical Provider, MD  tamsulosin (FLOMAX) 0.4 MG CAPS capsule Take 1 capsule (0.4 mg total) by mouth daily. 05/18/15   April Palumbo, MD  VIRTUSSIN A/C 100-10 MG/5ML syrup TAKE 5 MLS BY MOUTH AT BEDTIME 06/22/15   Historical Provider, MD     Family History Family History  Problem Relation Age of Onset  . Coronary artery disease    . Hypertension    . Diabetes    . Obesity    . Diabetes Mother   . Hypertension Mother     Social History Social History  Substance Use Topics  . Smoking status: Never Smoker  . Smokeless tobacco: Never Used  . Alcohol use No     Allergies   Propoxyphene n-acetaminophen   Review of Systems Review of Systems  Constitutional: Negative for chills and fever.  Respiratory: Negative.  Negative for cough and shortness of breath.   Cardiovascular: Negative.  Negative for chest pain.  Gastrointestinal: Positive for abdominal pain, nausea and vomiting.  Genitourinary: Positive for flank pain. Negative for dysuria and hematuria.  Musculoskeletal: Negative for myalgias.  Skin: Negative.   Neurological: Negative.      Physical Exam Updated Vital Signs BP 170/87   Pulse 88   Temp 97.4 F (36.3 C) (Oral)   Resp 17   Ht 5' 8.5" (1.74 m)   Wt (!) 186 kg   LMP 04/11/2009   SpO2 100%   BMI 61.43 kg/m   Physical Exam  Constitutional: She appears well-developed and well-nourished. She appears distressed.  Uncomfortable appearing.  HENT:  Head: Normocephalic.  Neck: Normal range of motion. Neck supple.  Cardiovascular: Normal rate and regular rhythm.   Pulmonary/Chest: Effort normal and breath sounds normal. She has no wheezes. She has no rales.  Abdominal: Soft. Bowel sounds are normal. There is tenderness (right side to RLQ). There is no rebound and no guarding.  Morbidly obese.  Genitourinary:  Genitourinary Comments: Right CVA tenderness.   Musculoskeletal: Normal range of motion.  Neurological: She is alert. No cranial nerve deficit.  Skin: Skin is warm and dry. No rash noted.  Psychiatric: She has a normal mood and affect.     ED Treatments / Results  Labs (all labs ordered are listed, but only abnormal  results are displayed) Labs Reviewed  COMPREHENSIVE METABOLIC  PANEL - Abnormal; Notable for the following:       Result Value   Glucose, Bld 269 (*)    BUN 22 (*)    Creatinine, Ser 1.44 (*)    AST 14 (*)    GFR calc non Af Amer 40 (*)    GFR calc Af Amer 46 (*)    All other components within normal limits  URINALYSIS, ROUTINE W REFLEX MICROSCOPIC (NOT AT [facility]) - Abnormal; Notable for the following:    Glucose, UA 500 (*)    All other components within normal limits  LIPASE, BLOOD  CBC   Results for orders placed or performed during the hospital encounter of 12/14/15  Lipase, blood  Result Value Ref Range   Lipase 30 11 - 51 U/L  Comprehensive metabolic panel  Result Value Ref Range   Sodium 137 135 - 145 mmol/L   Potassium 4.4 3.5 - 5.1 mmol/L   Chloride 103 101 - 111 mmol/L   CO2 27 22 - 32 mmol/L   Glucose, Bld 269 (H) 65 - 99 mg/dL   BUN 22 (H) 6 - 20 mg/dL   Creatinine, Ser 1.44 (H) 0.44 - 1.00 mg/dL   Calcium 9.2 8.9 - 10.3 mg/dL   Total Protein 6.6 6.5 - 8.1 g/dL   Albumin 3.5 3.5 - 5.0 g/dL   AST 14 (L) 15 - 41 U/L   ALT 21 14 - 54 U/L   Alkaline Phosphatase 79 38 - 126 U/L   Total Bilirubin 0.4 0.3 - 1.2 mg/dL   GFR calc non Af Amer 40 (L) >60 mL/min   GFR calc Af Amer 46 (L) >60 mL/min   Anion gap 7 5 - 15  CBC  Result Value Ref Range   WBC 6.4 4.0 - 10.5 K/uL   RBC 4.81 3.87 - 5.11 MIL/uL   Hemoglobin 13.7 12.0 - 15.0 g/dL   HCT 43.4 36.0 - 46.0 %   MCV 90.2 78.0 - 100.0 fL   MCH 28.5 26.0 - 34.0 pg   MCHC 31.6 30.0 - 36.0 g/dL   RDW 14.9 11.5 - 15.5 %   Platelets 275 150 - 400 K/uL  Urinalysis, Routine w reflex microscopic  Result Value Ref Range   Color, Urine YELLOW YELLOW   APPearance CLEAR CLEAR   Specific Gravity, Urine 1.023 1.005 - 1.030   pH 6.0 5.0 - 8.0   Glucose, UA 500 (A) NEGATIVE mg/dL   Hgb urine dipstick NEGATIVE NEGATIVE   Bilirubin Urine NEGATIVE NEGATIVE   Ketones, ur NEGATIVE NEGATIVE mg/dL   Protein, ur NEGATIVE NEGATIVE mg/dL   Nitrite NEGATIVE NEGATIVE   Leukocytes, UA NEGATIVE  NEGATIVE     EKG  EKG Interpretation None       Radiology US Renal  Result Date: 12/14/2015 CLINICAL DATA:  55 year old female with right flank pain. EXAM: RENAL / URINARY TRACT ULTRASOUND COMPLETE COMPARISON:  CT of the abdomen pelvis dated 10/24/2015 FINDINGS: Evaluation is limited due to patient's body habitus. Right Kidney: Length: 12 cm. Echogenicity within normal limits. No mass or hydronephrosis visualized. Left Kidney: Length: 12 cm. Echogenicity within normal limits. No mass or hydronephrosis visualized. Bladder: Appears normal for degree of bladder distention. IMPRESSION: No hydronephrosis or echogenic stone. Electronically Signed   By: Anner Crete M.D.   On: 12/14/2015 02:52    Procedures Procedures (including critical care time)  Medications Ordered in ED Medications  sodium chloride 0.9 % bolus 1,000  mL (0 mLs Intravenous Stopped 12/14/15 0306)  HYDROmorphone (DILAUDID) injection 1 mg (1 mg Intravenous Given 12/14/15 0147)  ondansetron (ZOFRAN) injection 4 mg (4 mg Intravenous Given 12/14/15 0147)  ketorolac (TORADOL) 30 MG/ML injection 30 mg (30 mg Intravenous Given 12/14/15 0303)     Initial Impression / Assessment and Plan / ED Course  I have reviewed the triage vital signs and the nursing notes.  Pertinent labs & imaging results that were available during my care of the patient were reviewed by me and considered in my medical decision making (see chart for details).  Clinical Course    Patient presents with symptoms of renal colic without evidence of infection. CT avoided due to high number of previous CT studies. [place] does not show hydronephrosis or echogenic stone. She has mild renal dysfunction (BUN 22, Cr 1.44). She reports history of the same. IVF's provided. Pain is currently controlled. VSS. She can be discharged home to follow up with urology.  Final Clinical Impressions(s) / ED Diagnoses   Final diagnoses:  None   1. Left renal colic  New  Prescriptions New Prescriptions   No medications on file     Charlann Lange, PA-C 12/14/15 0325    Everlene Balls, MD 12/14/15 [phone]

## 2015-12-15 ENCOUNTER — Ambulatory Visit: Payer: PPO | Admitting: Psychiatry

## 2015-12-23 ENCOUNTER — Ambulatory Visit: Payer: Self-pay | Admitting: Skilled Nursing Facility1

## 2015-12-28 ENCOUNTER — Other Ambulatory Visit: Payer: Self-pay | Admitting: Family Medicine

## 2015-12-28 ENCOUNTER — Ambulatory Visit: Payer: Self-pay | Admitting: Urology

## 2015-12-28 DIAGNOSIS — E119 Type 2 diabetes mellitus without complications: Secondary | ICD-10-CM | POA: Diagnosis not present

## 2015-12-28 DIAGNOSIS — I10 Essential (primary) hypertension: Secondary | ICD-10-CM | POA: Diagnosis not present

## 2015-12-28 DIAGNOSIS — E7800 Pure hypercholesterolemia, unspecified: Secondary | ICD-10-CM | POA: Diagnosis not present

## 2015-12-28 DIAGNOSIS — Z23 Encounter for immunization: Secondary | ICD-10-CM | POA: Diagnosis not present

## 2015-12-28 DIAGNOSIS — N771 Vaginitis, vulvitis and vulvovaginitis in diseases classified elsewhere: Secondary | ICD-10-CM | POA: Diagnosis not present

## 2015-12-28 DIAGNOSIS — E039 Hypothyroidism, unspecified: Secondary | ICD-10-CM | POA: Diagnosis not present

## 2015-12-28 DIAGNOSIS — Z139 Encounter for screening, unspecified: Secondary | ICD-10-CM | POA: Diagnosis not present

## 2015-12-28 DIAGNOSIS — N390 Urinary tract infection, site not specified: Secondary | ICD-10-CM | POA: Diagnosis not present

## 2015-12-29 ENCOUNTER — Encounter: Payer: Self-pay | Admitting: *Deleted

## 2015-12-29 ENCOUNTER — Telehealth: Payer: Self-pay | Admitting: *Deleted

## 2015-12-29 DIAGNOSIS — Z1231 Encounter for screening mammogram for malignant neoplasm of breast: Secondary | ICD-10-CM

## 2015-12-29 NOTE — Telephone Encounter (Signed)
Is this ok to refill?  

## 2015-12-29 NOTE — Telephone Encounter (Signed)
Pt has breast leakage. Order MM for upcoming annual visit.

## 2015-12-30 ENCOUNTER — Other Ambulatory Visit: Payer: Self-pay | Admitting: *Deleted

## 2015-12-30 MED ORDER — CARISOPRODOL 350 MG PO TABS: 350.0000 mg | ORAL_TABLET | Freq: Two times a day (BID) | ORAL | 0 refills | Status: DC

## 2015-12-30 NOTE — Telephone Encounter (Signed)
Rhea Please let her know I will cal in (or have you call in ) #60 with 2 refills but this medicine is NOW UNDER NEW GUIDELINES and I can only rx 60 w 2 refills for a 6 month period.  If she wantd to switch to cyclobenzaprine (flexeril) I could give her 5 mg tabs, one twice a day as needed for 5 months. Let me know and I will fix med list. THANKS! Dorcas Mcmurray

## 2015-12-30 NOTE — Telephone Encounter (Signed)
She said the flexeril doesn't really help her and will just make the SOMA work for the new guidelines. I put in the refill for 60 tablets.

## 2016-01-05 DIAGNOSIS — J9801 Acute bronchospasm: Secondary | ICD-10-CM | POA: Diagnosis not present

## 2016-01-05 DIAGNOSIS — R5383 Other fatigue: Secondary | ICD-10-CM | POA: Diagnosis not present

## 2016-01-05 DIAGNOSIS — J209 Acute bronchitis, unspecified: Secondary | ICD-10-CM | POA: Diagnosis not present

## 2016-01-07 ENCOUNTER — Ambulatory Visit: Payer: Self-pay | Admitting: Urology

## 2016-01-07 ENCOUNTER — Encounter: Payer: Self-pay | Admitting: Urology

## 2016-01-15 ENCOUNTER — Encounter: Payer: Self-pay | Admitting: Obstetrics & Gynecology

## 2016-01-15 ENCOUNTER — Other Ambulatory Visit (HOSPITAL_COMMUNITY)
Admission: RE | Admit: 2016-01-15 | Discharge: 2016-01-15 | Disposition: A | Discharge status: Home / Self Care | Payer: PPO | Source: Ambulatory Visit | Attending: Obstetrics & Gynecology | Admitting: Obstetrics & Gynecology

## 2016-01-15 ENCOUNTER — Ambulatory Visit (INDEPENDENT_AMBULATORY_CARE_PROVIDER_SITE_OTHER): Payer: PPO | Admitting: Obstetrics & Gynecology

## 2016-01-15 VITALS — BP 144/92 | Ht 68.5 in | Wt >= 6400 oz

## 2016-01-15 DIAGNOSIS — E038 Other specified hypothyroidism: Secondary | ICD-10-CM | POA: Diagnosis not present

## 2016-01-15 DIAGNOSIS — N390 Urinary tract infection, site not specified: Secondary | ICD-10-CM

## 2016-01-15 DIAGNOSIS — Z1151 Encounter for screening for human papillomavirus (HPV): Secondary | ICD-10-CM | POA: Insufficient documentation

## 2016-01-15 DIAGNOSIS — Z01419 Encounter for gynecological examination (general) (routine) without abnormal findings: Secondary | ICD-10-CM | POA: Insufficient documentation

## 2016-01-15 DIAGNOSIS — R319 Hematuria, unspecified: Secondary | ICD-10-CM

## 2016-01-15 DIAGNOSIS — N643 Galactorrhea not associated with childbirth: Secondary | ICD-10-CM

## 2016-01-15 NOTE — Progress Notes (Signed)
Subjective:    Maria [name] is a 55 y.o. 67 AA female who presents for an annual exam. The patient has no complaints today. The patient is sexually active. She has a new partner GYN screening history: last pap: was normal. The patient wears seatbelts: yes. The patient participates in regular exercise: yes. Has the patient ever been transfused or tattooed?: yes. (both) The patient reports that there is not domestic violence in her life.   Menstrual History: OB History    Gravida Para Term Preterm AB Living   4         3   SAB TAB Ectopic Multiple Live Births                  Obstetric Comments   Patient had two twin pregnancies.  One Pregnancy resulted in miscarriage completely and second twin pregnancy resulted in one live birth.  She also had a son that was killed in the [employer].       Patient's last menstrual period was 04/11/2009.    The following portions of the patient's history were reviewed and updated as appropriate: allergies, current medications, past family history, past medical history, past social history, past surgical history and problem list.  Review of Systems Pertinent items are noted in HPI.   She is on disability.   Objective:    BP (!) 144/92   Ht 5' 8.5" (1.74 m)   Wt (!) 406 lb (184.2 kg)   LMP 04/11/2009   BMI 60.83 kg/m   General Appearance:    Alert, cooperative, no distress, appears stated age  Head:    Normocephalic, without obvious abnormality, atraumatic  Eyes:    PERRL, conjunctiva/corneas clear, EOM's intact, fundi    benign, both eyes  Ears:    Normal TM's and external ear canals, both ears  Nose:   Nares normal, septum midline, mucosa normal, no drainage    or sinus tenderness  Throat:   Lips, mucosa, and tongue normal; teeth and gums normal  Neck:   Supple, symmetrical, trachea midline, no adenopathy;    thyroid:  no enlargement/tenderness/nodules; no carotid   bruit or JVD  Back:     Symmetric, no curvature, ROM normal, no CVA  tenderness  Lungs:     Clear to auscultation bilaterally, respirations unlabored  Chest Wall:    No tenderness or deformity   Heart:    Regular rate and rhythm, S1 and S2 normal, no murmur, rub   or gallop  Breast Exam:    No tenderness, masses, or nipple abnormality  Abdomen:     Soft, non-tender, bowel sounds active all four quadrants,    no masses, no organomegaly  Genitalia:    Normal female without lesion, discharge or tenderness     Extremities:   Extremities normal, atraumatic, no cyanosis or edema  Pulses:   2+ and symmetric all extremities  Skin:   Skin color, texture, turgor normal, no rashes or lesions  Lymph nodes:   Cervical, supraclavicular, and axillary nodes normal  Neurologic:   CNII-XII intact, normal strength, sensation and reflexes    throughout   .    Assessment:    Healthy female exam.    Plan:     Thin prep Pap smear. with cotesting

## 2016-01-16 LAB — WET PREP, GENITAL
Trich, Wet Prep: NONE SEEN
Yeast Wet Prep HPF POC: NONE SEEN

## 2016-01-16 LAB — HIV ANTIBODY (ROUTINE TESTING W REFLEX): HIV 1&2 Ab, 4th Generation: NONREACTIVE

## 2016-01-16 LAB — RPR

## 2016-01-16 LAB — HEPATITIS C ANTIBODY: HCV Ab: NEGATIVE

## 2016-01-16 LAB — HEPATITIS B SURFACE ANTIGEN: Hepatitis B Surface Ag: NEGATIVE

## 2016-01-16 LAB — PROLACTIN: Prolactin: 11.6 ng/mL

## 2016-01-18 ENCOUNTER — Encounter: Payer: Self-pay | Admitting: *Deleted

## 2016-01-18 LAB — CYTOLOGY - PAP

## 2016-01-20 ENCOUNTER — Telehealth: Payer: Self-pay | Admitting: *Deleted

## 2016-01-20 DIAGNOSIS — B9689 Other specified bacterial agents as the cause of diseases classified elsewhere: Secondary | ICD-10-CM

## 2016-01-20 DIAGNOSIS — B3731 Acute candidiasis of vulva and vagina: Secondary | ICD-10-CM

## 2016-01-20 DIAGNOSIS — N760 Acute vaginitis: Principal | ICD-10-CM

## 2016-01-20 DIAGNOSIS — B373 Candidiasis of vulva and vagina: Secondary | ICD-10-CM

## 2016-01-20 MED ORDER — FLUCONAZOLE 200 MG PO TABS: 200.0000 mg | ORAL_TABLET | Freq: Once | ORAL | 0 refills | Status: AC

## 2016-01-20 MED ORDER — METRONIDAZOLE 500 MG PO TABS: 500.0000 mg | ORAL_TABLET | Freq: Two times a day (BID) | ORAL | 0 refills | Status: DC

## 2016-01-20 NOTE — Telephone Encounter (Signed)
Called pt, no answer, left message to call the office.  

## 2016-01-20 NOTE — Telephone Encounter (Signed)
Informed pt of wet prep result and BV noted on wet prep.  Pt has had an increase in vaginal itching and recurrent yeast infections due to Diabetes, has been treated in the past with Diflucan 200mg .  Sent Flagyl and Diflucan to pharmacy and instructed pt on medication use.

## 2016-01-20 NOTE — Telephone Encounter (Signed)
-----   Message from Maria [name] sent at 01/20/2016  9:25 AM EDT ----- Regarding: Rx Request Contact: [phone] Patient called about her lab results from her visit on 11/3, I gave her all her lab results but she states she feels like she has a yeast infection, even though the wet prep didn't show anything. Wants to know if she can have a diflucan called into her pharmacy.

## 2016-01-20 NOTE — Telephone Encounter (Signed)
-----   Message from Francia Greaves sent at 01/20/2016  9:25 AM EDT ----- Regarding: Rx Request Contact: [phone] Patient called about her lab results from her visit on 11/3, I gave her all her lab results but she states she feels like she has a yeast infection, even though the wet prep didn't show anything. Wants to know if she can have a diflucan called into her pharmacy.

## 2016-01-25 ENCOUNTER — Telehealth: Payer: Self-pay | Admitting: *Deleted

## 2016-01-25 DIAGNOSIS — N6452 Nipple discharge: Secondary | ICD-10-CM

## 2016-01-25 NOTE — Telephone Encounter (Signed)
Breast center called needing an updated order for a digital mammogram due to breast discharge.  Order placed.

## 2016-01-27 ENCOUNTER — Encounter (HOSPITAL_COMMUNITY): Payer: Self-pay

## 2016-01-27 ENCOUNTER — Emergency Department (HOSPITAL_COMMUNITY): Payer: PPO

## 2016-01-27 ENCOUNTER — Emergency Department (HOSPITAL_COMMUNITY)
Admission: EM | Admit: 2016-01-27 | Discharge: 2016-01-27 | Disposition: A | Discharge status: Home / Self Care | Payer: PPO | Attending: Emergency Medicine | Admitting: Emergency Medicine

## 2016-01-27 DIAGNOSIS — Z96652 Presence of left artificial knee joint: Secondary | ICD-10-CM | POA: Diagnosis not present

## 2016-01-27 DIAGNOSIS — E119 Type 2 diabetes mellitus without complications: Secondary | ICD-10-CM | POA: Diagnosis not present

## 2016-01-27 DIAGNOSIS — Z794 Long term (current) use of insulin: Secondary | ICD-10-CM | POA: Insufficient documentation

## 2016-01-27 DIAGNOSIS — R109 Unspecified abdominal pain: Secondary | ICD-10-CM | POA: Diagnosis not present

## 2016-01-27 DIAGNOSIS — Z96651 Presence of right artificial knee joint: Secondary | ICD-10-CM | POA: Insufficient documentation

## 2016-01-27 DIAGNOSIS — I10 Essential (primary) hypertension: Secondary | ICD-10-CM | POA: Insufficient documentation

## 2016-01-27 DIAGNOSIS — Z79899 Other long term (current) drug therapy: Secondary | ICD-10-CM | POA: Insufficient documentation

## 2016-01-27 DIAGNOSIS — Z7984 Long term (current) use of oral hypoglycemic drugs: Secondary | ICD-10-CM | POA: Insufficient documentation

## 2016-01-27 LAB — CBC WITH DIFFERENTIAL/PLATELET
Basophils Absolute: 0 10*3/uL (ref 0.0–0.1)
Basophils Relative: 0 %
Eosinophils Absolute: 0.1 10*3/uL (ref 0.0–0.7)
Eosinophils Relative: 2 %
HCT: 39.8 % (ref 36.0–46.0)
Hemoglobin: 13 g/dL (ref 12.0–15.0)
Lymphocytes Relative: 50 %
Lymphs Abs: 2.8 10*3/uL (ref 0.7–4.0)
MCH: 28.8 pg (ref 26.0–34.0)
MCHC: 32.7 g/dL (ref 30.0–36.0)
MCV: 88.1 fL (ref 78.0–100.0)
Monocytes Absolute: 0.3 10*3/uL (ref 0.1–1.0)
Monocytes Relative: 6 %
Neutro Abs: 2.4 10*3/uL (ref 1.7–7.7)
Neutrophils Relative %: 42 %
Platelets: 247 10*3/uL (ref 150–400)
RBC: 4.52 MIL/uL (ref 3.87–5.11)
RDW: 14.8 % (ref 11.5–15.5)
WBC: 5.6 10*3/uL (ref 4.0–10.5)

## 2016-01-27 LAB — I-STAT CHEM 8, ED
BUN: 17 mg/dL (ref 6–20)
Calcium, Ion: 1.11 mmol/L — ABNORMAL LOW (ref 1.15–1.40)
Chloride: 100 mmol/L — ABNORMAL LOW (ref 101–111)
Creatinine, Ser: 1.1 mg/dL — ABNORMAL HIGH (ref 0.44–1.00)
Glucose, Bld: 197 mg/dL — ABNORMAL HIGH (ref 65–99)
HCT: 37 % (ref 36.0–46.0)
Hemoglobin: 12.6 g/dL (ref 12.0–15.0)
Potassium: 3.7 mmol/L (ref 3.5–5.1)
Sodium: 142 mmol/L (ref 135–145)
TCO2: 29 mmol/L (ref 0–100)

## 2016-01-27 LAB — URINALYSIS, ROUTINE W REFLEX MICROSCOPIC
Bilirubin Urine: NEGATIVE
Glucose, UA: NEGATIVE mg/dL
Hgb urine dipstick: NEGATIVE
Ketones, ur: NEGATIVE mg/dL
Leukocytes, UA: NEGATIVE
Nitrite: NEGATIVE
Protein, ur: 30 mg/dL — AB
Specific Gravity, Urine: 1.019 (ref 1.005–1.030)
pH: 5.5 (ref 5.0–8.0)

## 2016-01-27 LAB — URINE MICROSCOPIC-ADD ON: RBC / HPF: NONE SEEN RBC/hpf (ref 0–5)

## 2016-01-27 MED ORDER — ONDANSETRON HCL 4 MG/2ML IJ SOLN
4.0000 mg | Freq: Once | INTRAMUSCULAR | Status: AC
  Administered 2016-01-27: 4 mg via INTRAVENOUS
  Filled 2016-01-27: qty 2

## 2016-01-27 MED ORDER — SODIUM CHLORIDE 0.9 % IV SOLN
INTRAVENOUS | Status: DC
  Administered 2016-01-27: 125 mL/h via INTRAVENOUS

## 2016-01-27 MED ORDER — ETODOLAC 300 MG PO CAPS: 300.0000 mg | ORAL_CAPSULE | Freq: Three times a day (TID) | ORAL | 0 refills | Status: DC

## 2016-01-27 MED ORDER — KETOROLAC TROMETHAMINE 15 MG/ML IJ SOLN
15.0000 mg | Freq: Once | INTRAMUSCULAR | Status: AC
  Administered 2016-01-27: 15 mg via INTRAVENOUS
  Filled 2016-01-27: qty 1

## 2016-01-27 MED ORDER — HYDROMORPHONE HCL 2 MG/ML IJ SOLN
1.0000 mg | Freq: Once | INTRAMUSCULAR | Status: AC
  Administered 2016-01-27: 1 mg via INTRAVENOUS
  Filled 2016-01-27: qty 1

## 2016-01-27 NOTE — ED Provider Notes (Signed)
Urine appears contaminated.  Doubt UTI.  Some uric acid crystals.  Will dc home with nsaids.   Dorie Rank, MD 01/27/16 [phone]

## 2016-01-27 NOTE — ED Provider Notes (Signed)
[facility] DEPT Provider Note   CSN: [id number] Arrival date & time: 01/27/16  1134     History   Chief Complaint Chief Complaint  Patient presents with  . Flank Pain    HPI Maria [name] is a 55 y.o. female.  Patient is a 55 year old female with a history of morbid obesity, diabetes, fibroids, recurrent kidney stones presenting today with sharp 10 out of 10 nursing pain starting in her right flank and radiating around to her side. This started yesterday and has persisted. She is now starting to have emesis. She denies any change in her bowel movements and does note the stream of her urine is a little slower but has not noted any hematuria. No fever or rash present. Patient states her last kidney stone was in February which required lithotripsy and stent placement   The history is provided by the patient.  Flank Pain  This is a recurrent problem. The current episode started yesterday. The problem occurs constantly. The problem has been gradually worsening. Associated symptoms include abdominal pain. Associated symptoms comments: Right flank pain. Nothing aggravates the symptoms. Nothing relieves the symptoms. She has tried nothing for the symptoms. The treatment provided no relief.    Past Medical History:  Diagnosis Date  . Allergic rhinitis   . Anxiety   . Carpal tunnel syndrome, left   . Depression   . Diabetes mellitus   . DUB (dysfunctional uterine bleeding)   . External hemorrhoid   . GERD (gastroesophageal reflux disease)   . Gout   . Hypertension   . Morbid obesity ([place])   . Nephrolithiasis    recurrent  . Obesity   . Osteoarthritis, knee   . Recurrent UTI   . Rotator cuff syndrome of left shoulder     Patient Active Problem List   Diagnosis Date Noted  . Osteoarthritis of left hip 08/22/2014  . Cellulitis and abscess 05/31/2014  . Greater trochanteric bursitis of left hip 04/01/2014  . Radicular pain in left arm 09/27/2013  . Left shoulder pain  03/28/2013  . Rotator cuff syndrome 06/29/2011  . S/P TKR (total knee replacement) 01/17/2011  . Lateral epicondylitis 01/17/2011  . Preoperative clearance 07/15/2010  . Vaginal odor 06/01/2010  . Nasal congestion 06/01/2010  . Osteoarthrosis, unspecified whether generalized or localized, involving lower leg 12/11/2009  . GOUT, UNSPECIFIED 10/09/2009  . HEMORRHOIDS, EXTERNAL 06/17/2009  . Depression 04/23/2009  . NEPHROLITHIASIS, RECURRENT 04/23/2009  . CARPAL TUNNEL SYNDROME, LEFT 01/26/2009  . ROTATOR CUFF SYNDROME, LEFT 09/19/2008  . GERD 02/25/2008  . Recurrent UTI 05/02/2007  . Diabetes type 2, uncontrolled ([place]) 04/26/2006  . Hyperlipidemia 04/26/2006  . ANXIETY STATE NOS 04/26/2006  . Obstructive sleep apnea 04/26/2006  . MORBID OBESITY 01/11/2006  . Essential hypertension 01/11/2006  . ALLERGIC RHINITIS 01/11/2006  . DYSFUNCTIONAL UTERINE BLEEDING 01/11/2006    Past Surgical History:  Procedure Laterality Date  . CHOLECYSTECTOMY    . INCISION AND DRAINAGE ABSCESS N/A 06/01/2014   Procedure: INCISION AND DRAINAGE ABSCESS;  Surgeon: Rolm Bookbinder, MD;  Location: [facility];  Service: General;  Laterality: N/A;  . LITHOTRIPSY    . REPLACEMENT TOTAL KNEE BILATERAL    . TUBAL LIGATION      OB History    Gravida Para Term Preterm AB Living   4         3   SAB TAB Ectopic Multiple Live Births  Obstetric Comments   Patient had two twin pregnancies.  One Pregnancy resulted in miscarriage completely and second twin pregnancy resulted in one live birth.  She also had a son that was killed in the [employer]       Home Medications    Prior to Admission medications   Medication Sig Start Date End Date Taking? Authorizing Provider  ALPRAZolam Duanne Moron) 1 MG tablet Take 1 mg by mouth 3 (three) times daily as needed for anxiety.  10/18/11   Historical Provider, MD  B-D INS SYR ULTRAFINE 1CC/31G 31G X 5/16" 1 ML MISC  09/23/15   Historical Provider, MD  benazepril  (LOTENSIN) 20 MG tablet TAKE 1 TABLET BY MOUTH ONCE A DAY AS DIRECTED 11/07/14   Historical Provider, MD  BREO ELLIPTA 100-25 MCG/INH AEPB INHALE 1 PUFF INTO LUNGS QD 09/11/15   Historical Provider, MD  carisoprodol (SOMA) 350 MG tablet Take 1 tablet (350 mg total) by mouth 2 (two) times daily. 12/30/15   Dickie La, MD  colchicine (COLCRYS) 0.6 MG tablet Take 1 tablet (0.6 mg total) by mouth daily. 05/01/15   Dickie La, MD  glipiZIDE (GLUCOTROL) 5 MG tablet Take 5 mg by mouth 2 (two) times daily.      Historical Provider, MD  ibuprofen (ADVIL,MOTRIN) 800 MG tablet  09/18/15   Historical Provider, MD  insulin detemir (LEVEMIR) 100 UNIT/ML injection Inject 60 Units into the skin at bedtime.    Historical Provider, MD  Insulin Pen Needle (B-D ULTRAFINE III SHORT PEN) 31G X 8 MM MISC Use as directed once daily with Lantus solostar pen 06/02/10   Nneka Isamah  LEVEMIR FLEXTOUCH 100 UNIT/ML Pen  05/08/14   Historical Provider, MD  metFORMIN (GLUCOPHAGE) 1000 MG tablet Take 1,000 mg by mouth 2 (two) times daily with a meal.     Historical Provider, MD  metoprolol (TOPROL-XL) 200 MG 24 hr tablet Take 200 mg by mouth daily.     Historical Provider, MD  metroNIDAZOLE (FLAGYL) 500 MG tablet Take 1 tablet (500 mg total) by mouth 2 (two) times daily. 01/20/16   Emily Filbert, MD  omeprazole (PRILOSEC) 20 MG capsule Take 1 capsule (20 mg total) by mouth 2 (two) times daily. 05/12/13   [name], MD  pioglitazone (ACTOS) 30 MG tablet Take 30 mg by mouth daily. 09/08/15   Historical Provider, MD  promethazine (PHENERGAN) 25 MG tablet Take 1 tablet (25 mg total) by mouth every 6 (six) hours as needed for nausea or vomiting. 10/08/13   Fredia Sorrow, MD    Family History Family History  Problem Relation Age of Onset  . Coronary artery disease    . Hypertension    . Diabetes    . Obesity    . Diabetes Mother   . Hypertension Mother     Social History Social History  Substance Use Topics  . Smoking status: Never  Smoker  . Smokeless tobacco: Never Used  . Alcohol use No     Allergies   Propoxyphene n-acetaminophen   Review of Systems Review of Systems  Gastrointestinal: Positive for abdominal pain.  Genitourinary: Positive for flank pain.  All other systems reviewed and are negative.    Physical Exam Updated Vital Signs BP 141/76   Pulse 65   Temp 98.1 F (36.7 C) (Oral)   Resp 18   LMP 04/11/2009   SpO2 100%   Physical Exam  Constitutional: She is oriented to person, place, and time. She appears well-developed and  well-nourished. She appears distressed.  Appears uncomfortable.  Morbidly obese  HENT:  Head: Normocephalic and atraumatic.  Mouth/Throat: Oropharynx is clear and moist.  Eyes: Conjunctivae and EOM are normal. Pupils are equal, round, and reactive to light.  Neck: Normal range of motion. Neck supple.  Cardiovascular: Normal rate, regular rhythm and intact distal pulses.   No murmur heard. Pulmonary/Chest: Effort normal and breath sounds normal. No respiratory distress. She has no wheezes. She has no rales.  Abdominal: Soft. She exhibits no distension. There is tenderness. There is CVA tenderness. There is no rebound and no guarding.    Right CVA and side pain. No lower abdominal pain in the suprapubic or pelvic region  Musculoskeletal: Normal range of motion. She exhibits no edema or tenderness.  Neurological: She is alert and oriented to person, place, and time.  Skin: Skin is warm and dry. No rash noted. No erythema.  No Vesicles or rashes present  Psychiatric: She has a normal mood and affect. Her behavior is normal.  Nursing note and vitals reviewed.    ED Treatments / Results  Labs (all labs ordered are listed, but only abnormal results are displayed) Labs Reviewed  CBC WITH DIFFERENTIAL/PLATELET  URINALYSIS, ROUTINE W REFLEX MICROSCOPIC (NOT AT [facility])  I-STAT CHEM 8, ED    EKG  EKG Interpretation None       Radiology No results  found.  Procedures Procedures (including critical care time)  Medications Ordered in ED Medications  0.9 %  sodium chloride infusion (125 mL/hr Intravenous New Bag/Given 01/27/16 1214)  HYDROmorphone (DILAUDID) injection 1 mg (1 mg Intravenous Given 01/27/16 1216)  ondansetron (ZOFRAN) injection 4 mg (4 mg Intravenous Given 01/27/16 1214)     Initial Impression / Assessment and Plan / ED Course  I have reviewed the triage vital signs and the nursing notes.  Pertinent labs & imaging results that were available during my care of the patient were reviewed by me and considered in my medical decision making (see chart for details).  Clinical Course   Pt with symptoms consistent with kidney stone.  Denies infectious sx, or GI symptoms.  Low concern for diverticulitis and/or history suggestive of AAA.  No hx suggestive of GU source (discharge).  Pt has had fibroids in the past but feels like this is more typical of her [place] pain.  Pt last had lithotripsy and stent placement in feb.  Will hydrate, treat pain and ensure no infection with UA, CBC, CMP and will get renal US to further eval. UA pending but other labs wnl and [place] without hydronephrosis.  Final Clinical Impressions(s) / ED Diagnoses   Final diagnoses:  None    New Prescriptions New Prescriptions   No medications on file     Blanchie Dessert, MD 01/28/16 2217

## 2016-01-27 NOTE — ED Notes (Signed)
MD at bedside. 

## 2016-01-27 NOTE — ED Notes (Signed)
Pt being taken to US.

## 2016-01-27 NOTE — ED Triage Notes (Signed)
Patient complains of right flank pain since yesterday, has hx of stones in past. Alert and oriented, denies dysuria

## 2016-01-29 ENCOUNTER — Ambulatory Visit: Payer: PPO | Admitting: Family Medicine

## 2016-02-01 DIAGNOSIS — E669 Obesity, unspecified: Secondary | ICD-10-CM | POA: Diagnosis not present

## 2016-02-01 DIAGNOSIS — R609 Edema, unspecified: Secondary | ICD-10-CM | POA: Diagnosis not present

## 2016-02-01 DIAGNOSIS — R52 Pain, unspecified: Secondary | ICD-10-CM | POA: Diagnosis not present

## 2016-02-03 ENCOUNTER — Telehealth: Payer: Self-pay | Admitting: *Deleted

## 2016-02-03 DIAGNOSIS — B9689 Other specified bacterial agents as the cause of diseases classified elsewhere: Secondary | ICD-10-CM

## 2016-02-03 DIAGNOSIS — N760 Acute vaginitis: Principal | ICD-10-CM

## 2016-02-03 MED ORDER — METRONIDAZOLE 0.75 % VA GEL: 1.0000 | Freq: Every day | VAGINAL | 1 refills | Status: DC

## 2016-02-03 NOTE — Telephone Encounter (Signed)
-----   Message from Francia Greaves sent at 02/03/2016  8:41 AM EDT ----- Regarding: Rx Request Contact: [phone] Was seen in office and given Flagyl for BV, States that it hasn't dried up and wants to know if she could have something called in to dry it up?

## 2016-02-03 NOTE — Telephone Encounter (Signed)
Pt was treated for BV with Flagyl by mouth, states she is still experiencing a discharge with an odor.  Sent Metrogel to the pharmacy to see if pt would have better results with the gel, instructed pt on medication use.

## 2016-02-05 ENCOUNTER — Ambulatory Visit (INDEPENDENT_AMBULATORY_CARE_PROVIDER_SITE_OTHER): Payer: PPO | Admitting: Family Medicine

## 2016-02-05 ENCOUNTER — Encounter: Payer: Self-pay | Admitting: Family Medicine

## 2016-02-05 VITALS — BP 113/81 | HR 73 | Ht 69.0 in | Wt 398.0 lb

## 2016-02-05 DIAGNOSIS — M5010 Cervical disc disorder with radiculopathy, unspecified cervical region: Secondary | ICD-10-CM | POA: Diagnosis not present

## 2016-02-05 MED ORDER — DIAZEPAM 5 MG PO TABS: ORAL_TABLET | ORAL | 0 refills | Status: DC

## 2016-02-05 NOTE — Assessment & Plan Note (Addendum)
She has known cervical spine disease and has done well in the past with ESI.  Will get an updated MRI. She is interested in epidural steroid injections and will arrange that if this is the culprit and causing her problems.  Follow up as needed after that time.

## 2016-02-05 NOTE — Progress Notes (Signed)
SMC: Attending Note: I have reviewed the chart, discussed wit the Sports Medicine Fellow. I agree with assessment and treatment plan as detailed in the Fellow's note.  

## 2016-02-05 NOTE — Progress Notes (Signed)
  Maria [name] - 55 y.o. female MRN [id number]  Date of birth: August 01, 1960  SUBJECTIVE:  Including CC & ROS.  CC: right hand numbness and tingling  Presents with right hand numbness and tingling that has been ongoing for the past month. She also complains that she has a "crick in my neck."  She denies any radicular pain but states that the numbness and tingling is similar to her symptoms that she had years ago. She had an epidural spinal injection which did relieve her symptoms. She is interested in this again. Denies any history of carpal tunnel. Denies any weakness of her extremities.    ROS: No unexpected weight loss, fever, chills, swelling, instability, muscle pain, numbness/tingling, redness, otherwise see HPI   PMHx - Updated and reviewed.  Contributory factors include: Negative PSHx - Updated and reviewed.  Contributory factors include:  Negative FHx - Updated and reviewed.  Contributory factors include:  Negative Social Hx - Updated and reviewed. Contributory factors include: Negative Medications - reviewed   DATA REVIEWED: MRI performed in 2011 showed disc protrusion likely affecting left C6 nerve root, disc protrusion at C3-4 and small disc protrusion at C4-C5  PHYSICAL EXAM:  VS: BP:113/81  HR:73bpm  TEMP: ( )  RESP:   HT:5\' 9"  (175.3 cm)   WT:(!) 398 lb (180.5 kg)  BMI:58.9 PHYSICAL EXAM: Gen: NAD, alert, cooperative with exam, well-appearing HEENT: clear conjunctiva,  CV:  no edema, capillary refill brisk, normal rate Resp: non-labored Skin: no rashes, normal turgor  Neuro: no gross deficits.  Psych:  alert and oriented  Neck: Inspection unremarkable. No palpable stepoffs. Negative Spurling's maneuver. Globally decreased neck range of motion Grip strength normal in bilateral hands. Sensation decrease in right second through fifth digit Strength good C4 to T1 distribution No sensory change to C4 to T1 Negative Hoffman sign bilaterally +monkey sign Reflexes  normal Tinel's and Phalen's negative  ASSESSMENT & PLAN:   Cervical disc disorder with radiculopathy of cervical region She has known cervical spine disease and has done well in the past with ESI.  Will get an updated MRI. She is interested in epidural steroid injections and will arrange that if this is the culprit and causing her problems.  Follow up as needed after that time.

## 2016-02-08 ENCOUNTER — Ambulatory Visit: Payer: Self-pay | Admitting: Urology

## 2016-02-08 ENCOUNTER — Telehealth: Payer: Self-pay | Admitting: *Deleted

## 2016-02-08 DIAGNOSIS — N760 Acute vaginitis: Principal | ICD-10-CM

## 2016-02-08 DIAGNOSIS — B9689 Other specified bacterial agents as the cause of diseases classified elsewhere: Secondary | ICD-10-CM

## 2016-02-08 MED ORDER — ERYTHROMYCIN BASE 250 MG PO TABS: 250.0000 mg | ORAL_TABLET | Freq: Four times a day (QID) | ORAL | 0 refills | Status: DC

## 2016-02-08 NOTE — Telephone Encounter (Signed)
Pt called the office, is using Metrogel for BV, states she is a large lady and the gel continues to come out.  States she used Erythromycin in the past and it helped "dry her up", wants to know if the doctor will approve the medication.  Spoke to Dr Kennon Rounds about her case, sent rx to the pharmacy.

## 2016-02-12 ENCOUNTER — Other Ambulatory Visit: Payer: Self-pay | Admitting: Family Medicine

## 2016-02-16 DIAGNOSIS — R609 Edema, unspecified: Secondary | ICD-10-CM | POA: Diagnosis not present

## 2016-02-16 DIAGNOSIS — E119 Type 2 diabetes mellitus without complications: Secondary | ICD-10-CM | POA: Diagnosis not present

## 2016-02-16 DIAGNOSIS — Z139 Encounter for screening, unspecified: Secondary | ICD-10-CM | POA: Diagnosis not present

## 2016-02-17 ENCOUNTER — Emergency Department (HOSPITAL_COMMUNITY)
Admission: EM | Admit: 2016-02-17 | Discharge: 2016-02-17 | Disposition: A | Discharge status: Home / Self Care | Payer: PPO | Attending: Emergency Medicine | Admitting: Emergency Medicine

## 2016-02-17 ENCOUNTER — Encounter (HOSPITAL_COMMUNITY): Payer: Self-pay | Admitting: Emergency Medicine

## 2016-02-17 DIAGNOSIS — R799 Abnormal finding of blood chemistry, unspecified: Secondary | ICD-10-CM | POA: Diagnosis not present

## 2016-02-17 DIAGNOSIS — I10 Essential (primary) hypertension: Secondary | ICD-10-CM | POA: Diagnosis not present

## 2016-02-17 DIAGNOSIS — Z79899 Other long term (current) drug therapy: Secondary | ICD-10-CM | POA: Insufficient documentation

## 2016-02-17 DIAGNOSIS — E119 Type 2 diabetes mellitus without complications: Secondary | ICD-10-CM | POA: Diagnosis not present

## 2016-02-17 DIAGNOSIS — Z7984 Long term (current) use of oral hypoglycemic drugs: Secondary | ICD-10-CM | POA: Insufficient documentation

## 2016-02-17 DIAGNOSIS — Z711 Person with feared health complaint in whom no diagnosis is made: Secondary | ICD-10-CM

## 2016-02-17 DIAGNOSIS — R0689 Other abnormalities of breathing: Secondary | ICD-10-CM | POA: Insufficient documentation

## 2016-02-17 DIAGNOSIS — Z96653 Presence of artificial knee joint, bilateral: Secondary | ICD-10-CM | POA: Insufficient documentation

## 2016-02-17 DIAGNOSIS — Z794 Long term (current) use of insulin: Secondary | ICD-10-CM | POA: Diagnosis not present

## 2016-02-17 LAB — CBC
HCT: 39.9 % (ref 36.0–46.0)
Hemoglobin: 12.9 g/dL (ref 12.0–15.0)
MCH: 29 pg (ref 26.0–34.0)
MCHC: 32.3 g/dL (ref 30.0–36.0)
MCV: 89.7 fL (ref 78.0–100.0)
Platelets: 302 10*3/uL (ref 150–400)
RBC: 4.45 MIL/uL (ref 3.87–5.11)
RDW: 15 % (ref 11.5–15.5)
WBC: 5.7 10*3/uL (ref 4.0–10.5)

## 2016-02-17 LAB — COMPREHENSIVE METABOLIC PANEL
ALT: 22 U/L (ref 14–54)
AST: 15 U/L (ref 15–41)
Albumin: 3.3 g/dL — ABNORMAL LOW (ref 3.5–5.0)
Alkaline Phosphatase: 84 U/L (ref 38–126)
Anion gap: 10 (ref 5–15)
BUN: 11 mg/dL (ref 6–20)
CO2: 32 mmol/L (ref 22–32)
Calcium: 9.3 mg/dL (ref 8.9–10.3)
Chloride: 99 mmol/L — ABNORMAL LOW (ref 101–111)
Creatinine, Ser: 1.02 mg/dL — ABNORMAL HIGH (ref 0.44–1.00)
GFR calc Af Amer: >60 mL/min (ref >60)
GFR calc non Af Amer: >60 mL/min (ref >60)
Glucose, Bld: 224 mg/dL — ABNORMAL HIGH (ref 65–99)
Potassium: 3.9 mmol/L (ref 3.5–5.1)
Sodium: 141 mmol/L (ref 135–145)
Total Bilirubin: 0.8 mg/dL (ref 0.3–1.2)
Total Protein: 6.5 g/dL (ref 6.5–8.1)

## 2016-02-17 LAB — I-STAT TROPONIN, ED: Troponin i, poc: 0 ng/mL (ref 0.00–0.08)

## 2016-02-17 LAB — BRAIN NATRIURETIC PEPTIDE: B Natriuretic Peptide: 57.7 pg/mL (ref 0.0–100.0)

## 2016-02-17 NOTE — ED Triage Notes (Signed)
Pt sts told to come here for hyperkalemia; pt sts some HA at present and increased swelling x 3 months

## 2016-02-17 NOTE — Discharge Instructions (Signed)
See your Physician as scheduled.  Your potassium is normal.

## 2016-02-17 NOTE — ED Provider Notes (Signed)
[facility] DEPT Provider Note   CSN: [id number] Arrival date & time: 02/17/16  1014     History   Chief Complaint Chief Complaint  Patient presents with  . Abnormal Lab    HPI Maria [name] is a 55 y.o. female.  The history is provided by the patient.  Abnormal Lab  Time since result:  This am Patient referred by:  PCP Resulting agency:  External Result type: chemistry   Chemistry:    Potassium:  High Pt had a Physical and lab work in preparation for gastric bypass surgery.   Pt reports her Md called her this am and told to get to Emergency Department because of a potassium of 10.  Pt tearful.  Reports she is scared.  Pt has no history of renal failure.    Past Medical History:  Diagnosis Date  . Allergic rhinitis   . Anxiety   . Carpal tunnel syndrome, left   . Depression   . Diabetes mellitus   . DUB (dysfunctional uterine bleeding)   . External hemorrhoid   . GERD (gastroesophageal reflux disease)   . Gout   . Hypertension   . Morbid obesity ([place])   . Nephrolithiasis    recurrent  . Obesity   . Osteoarthritis, knee   . Recurrent UTI   . Rotator cuff syndrome of left shoulder     Patient Active Problem List   Diagnosis Date Noted  . Cervical disc disorder with radiculopathy of cervical region 02/05/2016  . Osteoarthritis of left hip 08/22/2014  . Cellulitis and abscess 05/31/2014  . Greater trochanteric bursitis of left hip 04/01/2014  . Radicular pain in left arm 09/27/2013  . Left shoulder pain 03/28/2013  . Rotator cuff syndrome 06/29/2011  . S/P TKR (total knee replacement) 01/17/2011  . Lateral epicondylitis 01/17/2011  . Preoperative clearance 07/15/2010  . Vaginal odor 06/01/2010  . Nasal congestion 06/01/2010  . Osteoarthrosis, unspecified whether generalized or localized, involving lower leg 12/11/2009  . GOUT, UNSPECIFIED 10/09/2009  . HEMORRHOIDS, EXTERNAL 06/17/2009  . Depression 04/23/2009  . NEPHROLITHIASIS, RECURRENT 04/23/2009    . CARPAL TUNNEL SYNDROME, LEFT 01/26/2009  . ROTATOR CUFF SYNDROME, LEFT 09/19/2008  . GERD 02/25/2008  . Recurrent UTI 05/02/2007  . Diabetes type 2, uncontrolled ([place]) 04/26/2006  . Hyperlipidemia 04/26/2006  . ANXIETY STATE NOS 04/26/2006  . Obstructive sleep apnea 04/26/2006  . MORBID OBESITY 01/11/2006  . Essential hypertension 01/11/2006  . ALLERGIC RHINITIS 01/11/2006  . DYSFUNCTIONAL UTERINE BLEEDING 01/11/2006    Past Surgical History:  Procedure Laterality Date  . CHOLECYSTECTOMY    . INCISION AND DRAINAGE ABSCESS N/A 06/01/2014   Procedure: INCISION AND DRAINAGE ABSCESS;  Surgeon: Rolm Bookbinder, MD;  Location: [facility];  Service: General;  Laterality: N/A;  . LITHOTRIPSY    . REPLACEMENT TOTAL KNEE BILATERAL    . TUBAL LIGATION      OB History    Gravida Para Term Preterm AB Living   4         3   SAB TAB Ectopic Multiple Live Births                  Obstetric Comments   Patient had two twin pregnancies.  One Pregnancy resulted in miscarriage completely and second twin pregnancy resulted in one live birth.  She also had a son that was killed in the [employer].       Home Medications    Prior to Admission medications   Medication Sig Start Date  End Date Taking? Authorizing Provider  ALPRAZolam Duanne Moron) 1 MG tablet Take 1 mg by mouth daily as needed for anxiety. 02/02/16  Yes Historical Provider, MD  BREO ELLIPTA 100-25 MCG/INH AEPB INHALE 1 PUFF INTO LUNGS QD 09/11/15  Yes Historical Provider, MD  colchicine 0.6 MG tablet TAKE 1 TABLET BY MOUTH DAILY 02/15/16  Yes Dickie La, MD  furosemide (LASIX) 20 MG tablet Take 20 mg by mouth daily. 12/28/15  Yes Historical Provider, MD  GLIPIZIDE XL 5 MG 24 hr tablet Take 5 mg by mouth 2 (two) times daily. 02/12/16  Yes Historical Provider, MD  ibuprofen (ADVIL,MOTRIN) 800 MG tablet Take 800 mg by mouth every 8 (eight) hours as needed for pain. 02/02/16  Yes Historical Provider, MD  LEVEMIR 100 UNIT/ML injection Inject 10  Units into the skin 3 (three) times daily. 12/08/15  Yes Historical Provider, MD  metFORMIN (GLUCOPHAGE) 1000 MG tablet Take 1,000 mg by mouth 2 (two) times daily with a meal.    Yes Historical Provider, MD  metoprolol (TOPROL-XL) 200 MG 24 hr tablet Take 200 mg by mouth daily.    Yes Historical Provider, MD  NOVOLOG 100 UNIT/ML injection Inject 0-10 Units into the skin daily as needed. Per sliding scale 01/05/16  Yes Historical Provider, MD  oxyCODONE (ROXICODONE) 15 MG immediate release tablet Take 15 mg by mouth 4 (four) times daily. 01/25/16  Yes Historical Provider, MD  pantoprazole (PROTONIX) 40 MG tablet Take 40 mg by mouth daily. 12/04/15  Yes Historical Provider, MD  pioglitazone (ACTOS) 30 MG tablet Take 30 mg by mouth daily. 09/08/15  Yes Historical Provider, MD  promethazine (PHENERGAN) 25 MG tablet Take 1 tablet (25 mg total) by mouth every 6 (six) hours as needed for nausea or vomiting. 10/08/13  Yes Fredia Sorrow, MD  carisoprodol (SOMA) 350 MG tablet Take 1 tablet (350 mg total) by mouth 2 (two) times daily. Patient not taking: Reported on 02/17/2016 12/30/15   Dickie La, MD  diazepam (VALIUM) 5 MG tablet Take 1 tablet about an hour before the MRI. Patient not taking: Reported on 02/17/2016 02/05/16   Dickie La, MD  erythromycin (E-MYCIN) 250 MG tablet Take 1 tablet (250 mg total) by mouth 4 (four) times daily. Patient not taking: Reported on 02/17/2016 02/08/16   Donnamae Jude, MD  etodolac (LODINE) 300 MG capsule Take 1 capsule (300 mg total) by mouth every 8 (eight) hours. Patient not taking: Reported on 02/17/2016 01/27/16   Dorie Rank, MD  metroNIDAZOLE (METROGEL) 0.75 % vaginal gel Place 1 Applicatorful vaginally at bedtime. Apply one applicatorful to vagina at bedtime for 5 days Patient not taking: Reported on 02/17/2016 02/03/16   Emily Filbert, MD  omeprazole (PRILOSEC) 20 MG capsule Take 1 capsule (20 mg total) by mouth 2 (two) times daily. Patient not taking: Reported on  02/17/2016 05/12/13   [name], MD    Family History Family History  Problem Relation Age of Onset  . Coronary artery disease    . Hypertension    . Diabetes    . Obesity    . Diabetes Mother   . Hypertension Mother     Social History Social History  Substance Use Topics  . Smoking status: Never Smoker  . Smokeless tobacco: Never Used  . Alcohol use No     Allergies   Propoxyphene n-acetaminophen   Review of Systems Review of Systems  All other systems reviewed and are negative.    Physical Exam Updated  Vital Signs BP 147/79   Pulse 83   Temp 97.8 F (36.6 C) (Oral)   Resp 13   LMP 04/11/2009   SpO2 98%   Physical Exam  Constitutional: She is oriented to person, place, and time. She appears well-developed and well-nourished.  HENT:  Head: Normocephalic.  Eyes: EOM are normal.  Neck: Normal range of motion.  Cardiovascular: Normal rate, regular rhythm and normal heart sounds.   Pulmonary/Chest: Effort normal and breath sounds normal.  Abdominal: She exhibits no distension.  Musculoskeletal: Normal range of motion.  Neurological: She is alert and oriented to person, place, and time.  Psychiatric: She has a normal mood and affect.  Nursing note and vitals reviewed.    ED Treatments / Results  Labs (all labs ordered are listed, but only abnormal results are displayed) Labs Reviewed  COMPREHENSIVE METABOLIC PANEL - Abnormal; Notable for the following:       Result Value   Chloride 99 (*)    Glucose, Bld 224 (*)    Creatinine, Ser 1.02 (*)    Albumin 3.3 (*)    All other components within normal limits  CBC  BRAIN NATRIURETIC PEPTIDE  URINALYSIS, ROUTINE W REFLEX MICROSCOPIC (NOT AT [facility])  Randolm Idol, ED    EKG  EKG Interpretation  Date/Time:  Wednesday February 17 2016 10:39:38 EST Ventricular Rate:  85 PR Interval:  138 QRS Duration: 98 QT Interval:  368 QTC Calculation: 437 R Axis:   1 Text Interpretation:  Sinus rhythm with  Premature atrial complexes Cannot rule out Anterior infarct , age undetermined Abnormal ECG No significant change since last tracing Confirmed by Alvino Chapel  MD, Ovid Curd [phone]) on 02/17/2016 10:50:35 AM       Radiology No results found.  Procedures Procedures (including critical care time)  Medications Ordered in ED Medications - No data to display   Initial Impression / Assessment and Plan / ED Course  I have reviewed the triage vital signs and the nursing notes.  Pertinent labs & imaging results that were available during my care of the patient were reviewed by me and considered in my medical decision making (see chart for details).  Clinical Course     k is 3.9.   I suspect hemolysis of blood drawn on pt.  Pt counseled on results.    Final Clinical Impressions(s) / ED Diagnoses   Final diagnoses:  None    New Prescriptions New Prescriptions   No medications on file     Fransico Meadow, PA-C 02/17/16 [place], MD 02/17/16 1535

## 2016-02-19 ENCOUNTER — Other Ambulatory Visit: Payer: Self-pay

## 2016-02-23 ENCOUNTER — Other Ambulatory Visit: Payer: Self-pay

## 2016-02-23 NOTE — Patient Outreach (Signed)
[facility]) Care Management  02/23/2016  Maria [name] 09-Mar-1961 [id number]  Referral date: 02/18/16 Referral source; ED census referral Referral reason:  4 or more ED visits within past 6 months.   Telephone call to patient regarding ED census referral. Unable to reach patient. HIPAA compliant voice message left with call back phone number.   PLAN;   RNCM will attempt 2nd telephone call to patient within 1 week   Quinn Plowman RN,BSN,CCM [facility] Telephonic  [phone]

## 2016-02-24 DIAGNOSIS — R52 Pain, unspecified: Secondary | ICD-10-CM | POA: Diagnosis not present

## 2016-02-24 DIAGNOSIS — L720 Epidermal cyst: Secondary | ICD-10-CM | POA: Diagnosis not present

## 2016-02-24 DIAGNOSIS — Z139 Encounter for screening, unspecified: Secondary | ICD-10-CM | POA: Diagnosis not present

## 2016-02-25 NOTE — Telephone Encounter (Signed)
This encounter was created in error - please disregard.

## 2016-02-27 ENCOUNTER — Telehealth: Payer: Self-pay | Admitting: Internal Medicine

## 2016-02-27 NOTE — Telephone Encounter (Signed)
[facility] After Hours Telephone Line   Regene Eapen called trying to get a hold of the Sports Medicine after hours clinic. She states she is planning on getting MRI this Monday, and needs a prescription for Valium which was suppose to be given to her. Directed patient to call the Sports Medicine clinic for the after hour line. Reviewed note and did not see any instructions for Valium discussed in the note. Therefore, indicated that patient will have to call the Sports Medicine clinic on Monday if not able to get a hold of anyone over the weekend.    Kerrin Mo PGY-2 [place]

## 2016-02-28 ENCOUNTER — Inpatient Hospital Stay: Admission: RE | Admit: 2016-02-28 | Payer: Self-pay | Source: Ambulatory Visit

## 2016-02-29 ENCOUNTER — Other Ambulatory Visit: Payer: Self-pay

## 2016-02-29 NOTE — Patient Outreach (Addendum)
[facility] Care Management  02/29/2016  Jaydee Dachel August 02, 1960 [id number]    Referral date: 02/18/16 Referral source; ED census referral Referral reason:  4 or more ED visits within past 6 months.   SUBJECTIVE: Telephone call to patient regarding ED census referral.  HIPAA verified with patient. Discussed and offered THn care management services to patient. Patient verbally agreed. Patient states she was in the emergency room on 02/21/16.  Patient states she was advised by her doctors office to go to emergency room due to an elevated potassium level of 10.  Patient states once she was seen in the emergency room and her lab work was repeated her potassium level was only 3.9. Patient states she is in the process of having preliminary work up for a gastric sleeve surgery. Patient states her height is 5 ft 8in and her weight is 402 lbs.  Patient states she does have diabetes. Patient reports she does not check her blood sugars.  Patient states her last A1c was 10.0.  Patient states she is unsure what her A1c is now.  Patient states she needs help with education and managing her diabetes.  ASSESSMENT; Patient will benefit from health coach for diabetes education / management.   Per emergency room note from 02/17/16 " patients k is 3.9.   suspect hemolysis of blood drawn on pt."  PLAN; RNCM will refer patient to health coach.   Quinn Plowman RN,BSN,CCM [facility] Telephonic  [phone]

## 2016-03-01 ENCOUNTER — Ambulatory Visit
Admission: RE | Admit: 2016-03-01 | Discharge: 2016-03-01 | Disposition: A | Discharge status: Home / Self Care | Payer: PPO | Source: Ambulatory Visit | Attending: Family Medicine | Admitting: Family Medicine

## 2016-03-01 ENCOUNTER — Other Ambulatory Visit: Payer: Self-pay

## 2016-03-01 ENCOUNTER — Telehealth: Payer: Self-pay | Admitting: *Deleted

## 2016-03-01 DIAGNOSIS — M5010 Cervical disc disorder with radiculopathy, unspecified cervical region: Secondary | ICD-10-CM

## 2016-03-01 DIAGNOSIS — J45901 Unspecified asthma with (acute) exacerbation: Secondary | ICD-10-CM | POA: Diagnosis not present

## 2016-03-01 DIAGNOSIS — M792 Neuralgia and neuritis, unspecified: Secondary | ICD-10-CM

## 2016-03-01 DIAGNOSIS — H10022 Other mucopurulent conjunctivitis, left eye: Secondary | ICD-10-CM | POA: Diagnosis not present

## 2016-03-01 DIAGNOSIS — M50221 Other cervical disc displacement at C4-C5 level: Secondary | ICD-10-CM | POA: Diagnosis not present

## 2016-03-01 DIAGNOSIS — E119 Type 2 diabetes mellitus without complications: Secondary | ICD-10-CM | POA: Diagnosis not present

## 2016-03-01 NOTE — Patient Outreach (Signed)
[place] [facility]) Care Management  03/01/2016  Freda Morse April 07, 1961 [id number]   Unsuccessful attempt to reach patient by phone.  HIPAA appropriate message left with call back information. If no response, RN will make another attempt to contact patient within 1 week.  Candie Mile, RN, MSN [place] [phone] Fax [phone]

## 2016-03-01 NOTE — Telephone Encounter (Signed)
Maria [name] Her MRI of C spine (ordered by Dr. Valentina Lucks reviewed it. No surgery needed but she has had some slight increase in arthritic changes. If she wants to pursue epidural steroid injection (which I think she has had before) please set that up. Please let her know THANKS! Dorcas Mcmurray

## 2016-03-02 NOTE — Telephone Encounter (Signed)
Informed patient of the results and she wants to start ESI's.   I will place order in epic and have patient call and set up the appt

## 2016-03-07 ENCOUNTER — Encounter: Payer: PPO | Attending: Surgery | Admitting: Dietician

## 2016-03-07 DIAGNOSIS — Z7984 Long term (current) use of oral hypoglycemic drugs: Secondary | ICD-10-CM | POA: Insufficient documentation

## 2016-03-07 DIAGNOSIS — Z713 Dietary counseling and surveillance: Secondary | ICD-10-CM | POA: Insufficient documentation

## 2016-03-07 DIAGNOSIS — E119 Type 2 diabetes mellitus without complications: Secondary | ICD-10-CM | POA: Diagnosis not present

## 2016-03-07 DIAGNOSIS — Z79899 Other long term (current) drug therapy: Secondary | ICD-10-CM | POA: Diagnosis not present

## 2016-03-07 NOTE — Patient Instructions (Addendum)
Plan to go to the gym early in the morning. And/or Call [place] to ask about water fitness: [phone]. Try protein shakes. Continue working on rest of pre op goals.

## 2016-03-07 NOTE — Progress Notes (Signed)
  4 Months Supervised Weight Loss Visit:   Pre-Operative Sleeve Gastrectomy Surgery  Medical Nutrition Therapy:  Appt start time: 1600 end time:  1615.  Primary concerns today: Supervised Weight Loss Visit. Has been sick and having a lot of swelling since this summer. Weight got up to 422 lbs and is back down to 412.2 lbs.   Has been trying to walk more (up ramp at house 4 x day). Cut back on salt and cut out sodas. Has started working on chewing well and food is digesting better and reflux is better. Not drinking during.   Was not eating breakfast but now eating breakfast and smaller portions. Has a younger friend who is preparing healthy meals and encouraging her to drink more water.   Has not tried protein shakes yet.   Weight: 412.2 lbs BMI: 62.7  Preferred Learning Style:   No preference indicated   Learning Readiness:   Ready  24-hr recall: B (AM): eggs with tomato and pepper with liver pudding Snk (AM): none  L (PM): tuna or [place] sandwich with tomato and lettuce or roast beef sandwich  Snk (PM): none  D (PM): chicken leg with cabbage with other vegetables and corn bread Snk (PM): no  Beverages: water  Medications: see list   Recent physical activity:  Walking some at home  Progress Towards Goal(s):  In progress.  Handouts given during visit include:  Pre Op Goals  Protein Shakes   Nutritional Diagnosis:  [place]-3.3 Obesity related to past poor dietary habits and physical inactivity as evidenced by patient attending supervised weight loss for insurance approval of bariatric surgery.    Intervention:  Nutrition counseling provided. Plans: Plan to go to the gym early in the morning. And/or Call [place] to ask about water fitness: [phone]. Try protein shakes. Continue working on rest of pre op goals.   Teaching Method Utilized:  Visual Auditory Hands on  Barriers to learning/adherence to lifestyle change: none  Demonstrated degree of understanding  via:  Teach Back   Monitoring/Evaluation:  Dietary intake, exercise, and body weight. Follow up in 1 months for 4 month supervised weight loss visit.

## 2016-03-08 ENCOUNTER — Other Ambulatory Visit: Payer: Self-pay

## 2016-03-08 NOTE — Patient Outreach (Signed)
[place] [facility]) Care Management  03/08/2016  Maria [name] 1960/05/05 [id number]   Telephone call to patient for initial assessment.  Patient answered and confirmed HIPAA identifiers.  However, patient reported she did not have time to talk with me, and requested a call back.  RN will make another attempt to reach patient within one week.  Candie Mile, RN, MSN [place] [phone] Fax [phone]

## 2016-03-10 ENCOUNTER — Other Ambulatory Visit: Payer: Self-pay

## 2016-03-10 VITALS — Ht 68.0 in | Wt 398.0 lb

## 2016-03-10 DIAGNOSIS — E1100 Type 2 diabetes mellitus with hyperosmolarity without nonketotic hyperglycemic-hyperosmolar coma (NKHHC): Secondary | ICD-10-CM

## 2016-03-10 NOTE — Patient Outreach (Signed)
[place] [facility]) Care Management  03/10/2016  Maria [name] 27-Feb-1961 [id number]   Telephone contact with patient for initial assessment.  However, the phone connection failed before the assessment was completed.  Called back to attempt finishing assessment, but had to leave a message.  (HIPAA appropriate message requesting call back.)  RN will make another attempt to contact patient and complete assessment within one week.  Candie Mile, RN, MSN [place] [phone] Fax [phone]

## 2016-03-15 ENCOUNTER — Other Ambulatory Visit: Payer: Self-pay

## 2016-03-15 NOTE — Patient Outreach (Signed)
[place] [facility]) Care Management  03/15/2016  Maria [name] 11-26-1960 [id number]  Unsuccessful attempt to reach patient to complete gathering data for initial assessment.  Contact on 03-10-16 was interrupted when phone connection died.  RN will make another attempt to reach patient within 10 working days.  Candie Mile, RN, MSN [place] [phone] Fax [phone]

## 2016-03-16 ENCOUNTER — Telehealth: Payer: Self-pay | Admitting: *Deleted

## 2016-03-16 DIAGNOSIS — B3731 Acute candidiasis of vulva and vagina: Secondary | ICD-10-CM

## 2016-03-16 DIAGNOSIS — B373 Candidiasis of vulva and vagina: Secondary | ICD-10-CM

## 2016-03-16 MED ORDER — FLUCONAZOLE 150 MG PO TABS: 150.0000 mg | ORAL_TABLET | ORAL | 3 refills | Status: DC

## 2016-03-16 NOTE — Telephone Encounter (Signed)
Pt complains of white vaginal discharge with vaginal itching for 3 days. Will send in Diflucan to take 1 pill every three days for 3 days.

## 2016-03-16 NOTE — Telephone Encounter (Signed)
-----   Message from Francia Greaves sent at 03/16/2016  8:32 AM EST ----- Regarding: Rx Request Contact: [phone] Wants something call in for a yeast infection (pt is requesting the 200mg  diflucan) [facility] [place]

## 2016-03-18 ENCOUNTER — Other Ambulatory Visit: Payer: Self-pay | Admitting: Family Medicine

## 2016-03-21 ENCOUNTER — Other Ambulatory Visit: Payer: Self-pay | Admitting: Family Medicine

## 2016-03-21 ENCOUNTER — Telehealth: Payer: Self-pay | Admitting: *Deleted

## 2016-03-21 ENCOUNTER — Other Ambulatory Visit: Payer: Self-pay | Admitting: Obstetrics & Gynecology

## 2016-03-21 DIAGNOSIS — N6452 Nipple discharge: Secondary | ICD-10-CM

## 2016-03-21 DIAGNOSIS — M5010 Cervical disc disorder with radiculopathy, unspecified cervical region: Secondary | ICD-10-CM

## 2016-03-21 NOTE — Telephone Encounter (Signed)
Per [facility], the patients weight exceeds their table limit of 400lb, so they will not schedule her for the cervical ESI. Let us know what else we can suggest

## 2016-03-21 NOTE — Telephone Encounter (Signed)
[place] I guess you could check with [facility] and see what their table limitis. Other than that I do not know of any additional options. THANKS! Dorcas Mcmurray

## 2016-03-24 ENCOUNTER — Other Ambulatory Visit: Payer: Self-pay | Admitting: Family Medicine

## 2016-03-24 ENCOUNTER — Inpatient Hospital Stay (HOSPITAL_COMMUNITY): Admission: RE | Admit: 2016-03-24 | Payer: Self-pay | Source: Ambulatory Visit

## 2016-03-24 ENCOUNTER — Other Ambulatory Visit: Payer: Self-pay

## 2016-03-24 DIAGNOSIS — M5412 Radiculopathy, cervical region: Secondary | ICD-10-CM

## 2016-03-24 NOTE — Patient Outreach (Signed)
[place] [facility]) Care Management  03/24/2016  Zooey Mckane 1961-01-09 [id number]   Second unsuccessful attempt to reach patient to complete initial assessment. Contact for initial assessment on 03-10-16 was interrupted, and this RN has been unsuccessful in establishing contact since then.  HIPAA appropriate message left requesting call back.  If no response RN will make another attempt within 10 days.  Candie Mile, RN, MSN [place] [phone] Fax [phone]

## 2016-03-25 ENCOUNTER — Inpatient Hospital Stay
Admission: RE | Admit: 2016-03-25 | Discharge: 2016-03-25 | Disposition: A | Discharge status: Home / Self Care | Payer: Self-pay | Source: Ambulatory Visit | Attending: Family Medicine | Admitting: Family Medicine

## 2016-03-25 NOTE — Discharge Instructions (Signed)

## 2016-03-28 ENCOUNTER — Telehealth: Payer: Self-pay | Admitting: *Deleted

## 2016-03-28 ENCOUNTER — Ambulatory Visit: Payer: Self-pay | Admitting: Dietician

## 2016-03-28 DIAGNOSIS — B9689 Other specified bacterial agents as the cause of diseases classified elsewhere: Secondary | ICD-10-CM

## 2016-03-28 DIAGNOSIS — B3731 Acute candidiasis of vulva and vagina: Secondary | ICD-10-CM

## 2016-03-28 DIAGNOSIS — N760 Acute vaginitis: Secondary | ICD-10-CM

## 2016-03-28 DIAGNOSIS — B373 Candidiasis of vulva and vagina: Secondary | ICD-10-CM

## 2016-03-28 MED ORDER — FLUCONAZOLE 200 MG PO TABS: 200.0000 mg | ORAL_TABLET | Freq: Once | ORAL | 0 refills | Status: AC

## 2016-03-28 MED ORDER — METRONIDAZOLE 500 MG PO TABS: 500.0000 mg | ORAL_TABLET | Freq: Two times a day (BID) | ORAL | 0 refills | Status: DC

## 2016-03-28 NOTE — Telephone Encounter (Signed)
-----   Message from Francia Greaves sent at 03/28/2016 10:37 AM EST ----- Regarding: Rx Request Contact: [phone] Wants to know if she can have something called in for vaginitis Uses [facility] [address] in [place]

## 2016-03-28 NOTE — Telephone Encounter (Signed)
Called pt, no answer, left message to call the office.  

## 2016-03-28 NOTE — Telephone Encounter (Signed)
Pt has history of recurrent vaginitis, currently experiencing a discharge and vaginal irritation.  Sent Flagyl and Diflucan to the pharmacy.

## 2016-03-29 ENCOUNTER — Encounter: Payer: PPO | Attending: Surgery | Admitting: Dietician

## 2016-03-29 ENCOUNTER — Other Ambulatory Visit: Payer: Self-pay

## 2016-03-29 DIAGNOSIS — Z713 Dietary counseling and surveillance: Secondary | ICD-10-CM | POA: Insufficient documentation

## 2016-03-29 DIAGNOSIS — Z7984 Long term (current) use of oral hypoglycemic drugs: Secondary | ICD-10-CM | POA: Diagnosis not present

## 2016-03-29 DIAGNOSIS — Z79899 Other long term (current) drug therapy: Secondary | ICD-10-CM | POA: Diagnosis not present

## 2016-03-29 DIAGNOSIS — E119 Type 2 diabetes mellitus without complications: Secondary | ICD-10-CM | POA: Diagnosis not present

## 2016-03-29 NOTE — Progress Notes (Signed)
  4 Months Supervised Weight Loss Visit:   Pre-Operative Sleeve Gastrectomy Surgery  Medical Nutrition Therapy:  Appt start time: 1150 end time:  1205  Primary concerns today: Supervised Weight Loss Visit. Returns with a 7 lb weight loss. Does not have as much of appetite as she used to and getting fuller faster. Chewing well. Planning to go to the gym. Going up the ramp to her house faster.  Eating 2 meals per day and one snack.   Has been trying to walk more (up ramp at house 4 x day). Cut back on salt and cut out sodas. Not drinking during meals.   Has a younger friend who is preparing healthy meals and encouraging her to drink more water.   Has not tried protein shakes yet.   Weight: 412.2 lbs 405.3 lbs  BMI: 61.6  Preferred Learning Style:   No preference indicated   Learning Readiness:   Ready  24-hr recall: B (AM): eggs with tomato and pepper  Snk (AM): none  L (PM): fruit Snk (PM): none  D (PM): meat loaf with potatoes and green peas  Snk (PM): fruit   Beverages: water  Medications: see list   Recent physical activity:  Walking some at home  Progress Towards Goal(s):  In progress.  Handouts given during visit include:  None  Supplements given during visit include:  Strawberry Premier Protein, lot [id number], lot # 01/25/[id number]   Nutritional Diagnosis:  [place]-3.3 Obesity related to past poor dietary habits and physical inactivity as evidenced by patient attending supervised weight loss for insurance approval of bariatric surgery.    Intervention:  Nutrition counseling provided. Plans: Plan to go to the gym early in the morning. And/or Call [place] to ask about water fitness: [phone]. Try protein shakes. Continue working on rest of pre op goals.   Teaching Method Utilized:  Visual Auditory Hands on  Barriers to learning/adherence to lifestyle change: none  Demonstrated degree of understanding via:  Teach Back   Monitoring/Evaluation:   Dietary intake, exercise, and body weight. Follow up in 1 months for 4 month supervised weight loss visit.

## 2016-03-29 NOTE — Patient Outreach (Signed)
[place] [facility]) Care Management  03/29/2016  Lynora Twombly 1961-03-02 [id number]  Third unsuccessful attempt to reach patient by phone.  HIPAA appropriate message left requesting call back. RN will send letter to patient. If no call back within 10 days RN will do case closure and notify PCP.   Candie Mile, RN, MSN [place] [phone] Fax [phone]

## 2016-03-29 NOTE — Patient Instructions (Addendum)
Plan to go to the gym early in the morning. And/or Call [place] to ask about water fitness: [phone]. Try protein shakes. Continue working on rest of pre op goals.

## 2016-03-31 ENCOUNTER — Ambulatory Visit: Payer: Self-pay

## 2016-04-01 DIAGNOSIS — R100 Acute abdomen: Secondary | ICD-10-CM | POA: Diagnosis not present

## 2016-04-01 DIAGNOSIS — N3000 Acute cystitis without hematuria: Secondary | ICD-10-CM | POA: Diagnosis not present

## 2016-04-13 ENCOUNTER — Other Ambulatory Visit: Payer: Self-pay

## 2016-04-13 NOTE — Patient Outreach (Signed)
[place] [facility]) Care Management  04/13/2016  Maria [name] [birth year] [id number]  Case closure due to inability to maintain contact.  Plan:  Notify PCP and [facility] CMA of case closure.  Candie Mile, RN, MSN [place] [phone] Fax [phone]

## 2016-04-19 ENCOUNTER — Ambulatory Visit: Payer: Self-pay | Admitting: Dietician

## 2016-04-19 ENCOUNTER — Ambulatory Visit: Payer: Self-pay | Admitting: Skilled Nursing Facility1

## 2016-04-25 DIAGNOSIS — E119 Type 2 diabetes mellitus without complications: Secondary | ICD-10-CM | POA: Diagnosis not present

## 2016-04-25 DIAGNOSIS — E7800 Pure hypercholesterolemia, unspecified: Secondary | ICD-10-CM | POA: Diagnosis not present

## 2016-04-25 DIAGNOSIS — H00014 Hordeolum externum left upper eyelid: Secondary | ICD-10-CM | POA: Diagnosis not present

## 2016-04-27 ENCOUNTER — Other Ambulatory Visit: Payer: Self-pay

## 2016-04-28 ENCOUNTER — Ambulatory Visit: Payer: Self-pay | Admitting: Skilled Nursing Facility1

## 2016-05-02 ENCOUNTER — Ambulatory Visit: Payer: Self-pay | Admitting: Dietician

## 2016-05-04 ENCOUNTER — Encounter: Payer: PPO | Attending: Surgery | Admitting: Skilled Nursing Facility1

## 2016-05-04 ENCOUNTER — Encounter: Payer: Self-pay | Admitting: Skilled Nursing Facility1

## 2016-05-04 DIAGNOSIS — E119 Type 2 diabetes mellitus without complications: Secondary | ICD-10-CM | POA: Diagnosis not present

## 2016-05-04 DIAGNOSIS — Z713 Dietary counseling and surveillance: Secondary | ICD-10-CM | POA: Diagnosis not present

## 2016-05-04 DIAGNOSIS — Z7984 Long term (current) use of oral hypoglycemic drugs: Secondary | ICD-10-CM | POA: Insufficient documentation

## 2016-05-04 DIAGNOSIS — Z79899 Other long term (current) drug therapy: Secondary | ICD-10-CM | POA: Diagnosis not present

## 2016-05-04 DIAGNOSIS — E6609 Other obesity due to excess calories: Secondary | ICD-10-CM

## 2016-05-04 NOTE — Progress Notes (Signed)
  4 Months Supervised Weight Loss Visit:   Pre-Operative Sleeve Gastrectomy Surgery  Medical Nutrition Therapy:  Appt start time: 4:37 end time:  5:06  Primary concerns today: Supervised Weight Loss Visit. Returns having lost about 4 pounds. Pt states her A1C is 10. 6 and now has an appointment with an endocrinologist. Pt states she cannot walk long distances due to back pain. Pt checked her blood sugar which was 257 post prandial.   Weight: 409.6 lbs BMI: 62.28  Preferred Learning Style:   No preference indicated   Learning Readiness:   Ready  24-hr recall: B (AM): grapefruit Snk (AM): none  L (PM): grapefruit Snk (PM): none  D (PM): chicken breast in olive oil and broccoli   Snk (PM): fruit   Beverages: water  Medications: see list   Recent physical activity:  Walking some at home  Progress Towards Goal(s):  In progress.  Handouts given during visit include:  None   Nutritional Diagnosis:  [place]-3.3 Obesity related to past poor dietary habits and physical inactivity as evidenced by patient attending supervised weight loss for insurance approval of bariatric surgery.    Intervention:  Nutrition counseling provided. Plans: Try some protein shakes  -Drink the protein shake with your grapefruit   -Always bring your meter with you everywhere you go -Always Properly dispose of your needles:  -Discard in a hard plastic/metal container with a lid (something the needle can't puncture)  -Write Do Not Recycle on the outside of the container  -Example: A laundry detergent bottle -Never use the same needle more than once -Try to be more active -Always pay attention to your body keeping watchful of possible low blood sugar (below 70) or high blood sugar (above 200)  -Check your feet every day looking for anything that was not there the day before  -Start water aerobics   -Check your sugar before you eat anything in the morning (80-130) and then 2 hours after you have a  meal (80-180)  -If your sugar is over 200 drink water if it is under 70 that is low, have some fast acting sugar then rest for 15 minutes and check again then have a snack with protein   Teaching Method Utilized:  Visual Auditory Hands on  Barriers to learning/adherence to lifestyle change: none  Demonstrated degree of understanding via:  Teach Back   Monitoring/Evaluation:  Dietary intake, exercise, and body weight. Follow up in 1 months for 4 month supervised weight loss visit.

## 2016-05-04 NOTE — Patient Instructions (Addendum)
-  Try some protein shakes  -Drink the protein shake with your grapefruit   -Always bring your meter with you everywhere you go -Always Properly dispose of your needles:  -Discard in a hard plastic/metal container with a lid (something the needle can't puncture)  -Write Do Not Recycle on the outside of the container  -Example: A laundry detergent bottle -Never use the same needle more than once -Try to be more active -Always pay attention to your body keeping watchful of possible low blood sugar (below 70) or high blood sugar (above 200)  -Check your feet every day looking for anything that was not there the day before  -Start water aerobics   -Check your sugar before you eat anything in the morning (80-130) and then 2 hours after you have a meal (80-180)  -If your sugar is over 200 drink water if it is under 70 that is low, have some fast acting sugar then rest for 15 minutes and check again then have a snack with protein

## 2016-05-06 ENCOUNTER — Ambulatory Visit
Admission: RE | Admit: 2016-05-06 | Discharge: 2016-05-06 | Disposition: A | Discharge status: Home / Self Care | Payer: PPO | Source: Ambulatory Visit | Attending: Family Medicine | Admitting: Family Medicine

## 2016-05-06 DIAGNOSIS — M5412 Radiculopathy, cervical region: Secondary | ICD-10-CM

## 2016-05-06 NOTE — Discharge Instructions (Signed)

## 2016-05-10 ENCOUNTER — Other Ambulatory Visit (HOSPITAL_COMMUNITY): Payer: Self-pay | Admitting: Diagnostic Radiology

## 2016-05-10 ENCOUNTER — Ambulatory Visit: Payer: Self-pay | Admitting: Dietician

## 2016-05-12 ENCOUNTER — Other Ambulatory Visit: Payer: Self-pay | Admitting: Family Medicine

## 2016-05-12 DIAGNOSIS — E119 Type 2 diabetes mellitus without complications: Secondary | ICD-10-CM | POA: Diagnosis not present

## 2016-05-12 DIAGNOSIS — F4321 Adjustment disorder with depressed mood: Secondary | ICD-10-CM | POA: Diagnosis not present

## 2016-05-12 DIAGNOSIS — M1990 Unspecified osteoarthritis, unspecified site: Secondary | ICD-10-CM

## 2016-05-12 DIAGNOSIS — J209 Acute bronchitis, unspecified: Secondary | ICD-10-CM | POA: Diagnosis not present

## 2016-05-12 DIAGNOSIS — J9801 Acute bronchospasm: Secondary | ICD-10-CM | POA: Diagnosis not present

## 2016-05-13 ENCOUNTER — Other Ambulatory Visit: Payer: Self-pay | Admitting: *Deleted

## 2016-05-13 MED ORDER — DIAZEPAM 5 MG PO TABS: ORAL_TABLET | ORAL | 0 refills | Status: DC

## 2016-05-16 ENCOUNTER — Ambulatory Visit (HOSPITAL_COMMUNITY): Admission: RE | Admit: 2016-05-16 | Payer: PPO | Source: Ambulatory Visit

## 2016-05-18 ENCOUNTER — Ambulatory Visit (HOSPITAL_COMMUNITY)
Admission: RE | Admit: 2016-05-18 | Discharge: 2016-05-18 | Disposition: A | Discharge status: Home / Self Care | Payer: PPO | Source: Ambulatory Visit | Attending: Family Medicine | Admitting: Family Medicine

## 2016-05-18 ENCOUNTER — Encounter (HOSPITAL_COMMUNITY): Payer: Self-pay | Admitting: Radiology

## 2016-05-18 DIAGNOSIS — M5412 Radiculopathy, cervical region: Secondary | ICD-10-CM | POA: Diagnosis not present

## 2016-05-18 DIAGNOSIS — M50221 Other cervical disc displacement at C4-C5 level: Secondary | ICD-10-CM | POA: Diagnosis not present

## 2016-05-18 DIAGNOSIS — M1990 Unspecified osteoarthritis, unspecified site: Secondary | ICD-10-CM

## 2016-05-18 HISTORY — PX: IR GENERIC HISTORICAL: IMG1180011

## 2016-05-18 MED ORDER — SODIUM CHLORIDE 0.9 % IJ SOLN
INTRAMUSCULAR | Status: AC
  Filled 2016-05-18: qty 10

## 2016-05-18 MED ORDER — TRIAMCINOLONE ACETONIDE 40 MG/ML IJ SUSP (RADIOLOGY)
60.0000 mg | Freq: Once | INTRAMUSCULAR | Status: AC
  Administered 2016-05-18: 60 mg via EPIDURAL
  Filled 2016-05-18: qty 2

## 2016-05-18 MED ORDER — LIDOCAINE HCL (PF) 1 % IJ SOLN
INTRAMUSCULAR | Status: AC
  Filled 2016-05-18: qty 15

## 2016-05-18 MED ORDER — IOPAMIDOL (ISOVUE-300) INJECTION 61%
INTRAVENOUS | Status: AC
  Administered 2016-05-18: 2 mL
  Filled 2016-05-18: qty 50

## 2016-05-18 MED ORDER — LIDOCAINE HCL (PF) 1 % IJ SOLN
INTRAMUSCULAR | Status: DC | PRN
Start: 2016-05-18 — End: 2016-05-19
  Administered 2016-05-18: 2 mL

## 2016-05-18 MED ORDER — STERILE WATER FOR INJECTION IJ SOLN
INTRAMUSCULAR | Status: AC
  Filled 2016-05-18: qty 10

## 2016-05-21 ENCOUNTER — Other Ambulatory Visit: Payer: Self-pay | Admitting: Family Medicine

## 2016-05-30 ENCOUNTER — Encounter: Payer: PPO | Attending: Surgery | Admitting: Skilled Nursing Facility1

## 2016-05-30 ENCOUNTER — Telehealth: Payer: Self-pay | Admitting: *Deleted

## 2016-05-30 ENCOUNTER — Encounter: Payer: Self-pay | Admitting: Skilled Nursing Facility1

## 2016-05-30 DIAGNOSIS — E119 Type 2 diabetes mellitus without complications: Secondary | ICD-10-CM | POA: Diagnosis not present

## 2016-05-30 DIAGNOSIS — Z7984 Long term (current) use of oral hypoglycemic drugs: Secondary | ICD-10-CM | POA: Insufficient documentation

## 2016-05-30 DIAGNOSIS — Z79899 Other long term (current) drug therapy: Secondary | ICD-10-CM | POA: Diagnosis not present

## 2016-05-30 DIAGNOSIS — Z713 Dietary counseling and surveillance: Secondary | ICD-10-CM | POA: Diagnosis not present

## 2016-05-30 NOTE — Progress Notes (Signed)
  4 Months Supervised Weight Loss Visit:   Pre-Operative Sleeve Gastrectomy Surgery  Medical Nutrition Therapy:  Appt start time: 4:37 end time:  5:06  Primary concerns today: Supervised Weight Loss Visit. Returns having gained about 8.8 pounds. Pt states she is retaining a lot of fluid in her lower limbs. Pt states her PCP does not want her to have surgery at this time due to her current A1C (10.6)but pt states her surgeon disagrees with that notion and will continue with the surgery. Pt states this is her last SWL appointment.  Pt states she has an appointment with her surgeon the 13th. Pt states she is taking her insulin, checking 2-3 times a day(140,150, 257, 145) and keeping a record. Pt states she has been drinking water if her blood sugar is over 200. Pt states she takes her fluid pill every day. Pt states her back pain has been a Little better since getting her epidural. Pt states her car has broken down. Pt state she has Stopped cool aid and soda but Still drinking juice  Weight: 417.9 lbs BMI: 63.54  Preferred Learning Style:   No preference indicated   Learning Readiness:   Ready  24-hr recall: B (AM): grapefruit Snk (AM): none  L (PM): grapefruit Snk (PM): none  D (PM): chicken breast in olive oil and broccoli   Snk (PM): fruit   Beverages: water  Medications: see list   Recent physical activity:  Walking some at home  Progress Towards Goal(s):  In progress.  Handouts given during visit include:  None   Nutritional Diagnosis:  [place]-3.3 Obesity related to past poor dietary habits and physical inactivity as evidenced by patient attending supervised weight loss for insurance approval of bariatric surgery.    Intervention:  Nutrition counseling provided. Plans: Try some protein shakes  -Drink the protein shake with your grapefruit   -Always bring your meter with you everywhere you go -Always Properly dispose of your needles:  -Discard in a hard plastic/metal  container with a lid (something the needle can't puncture)  -Write Do Not Recycle on the outside of the container  -Example: A laundry detergent bottle -Never use the same needle more than once -Try to be more active -Always pay attention to your body keeping watchful of possible low blood sugar (below 70) or high blood sugar (above 200)  -Check your feet every day looking for anything that was not there the day before  -Start water aerobics   -Check your sugar before you eat anything in the morning (80-130) and then 2 hours after you have a meal (80-180)  -If your sugar is over 200 drink water if it is under 70 that is low, have some fast acting sugar then rest for 15 minutes and check again then have a snack with protein   -work on arm chair exercises   Teaching Method Utilized:  Visual Auditory Hands on  Barriers to learning/adherence to lifestyle change: none  Demonstrated degree of understanding via:  Teach Back   Monitoring/Evaluation:  Dietary intake, exercise, and body weight. Follow up in 1 months for 4 month supervised weight loss visit.

## 2016-05-31 NOTE — Telephone Encounter (Signed)
She needs to talk with her PCP about that. THANKS! Maria [name]

## 2016-06-01 ENCOUNTER — Ambulatory Visit: Payer: Self-pay | Admitting: Skilled Nursing Facility1

## 2016-06-06 DIAGNOSIS — Z6841 Body Mass Index (BMI) 40.0 and over, adult: Secondary | ICD-10-CM | POA: Diagnosis not present

## 2016-06-06 DIAGNOSIS — Z794 Long term (current) use of insulin: Secondary | ICD-10-CM | POA: Diagnosis not present

## 2016-06-06 DIAGNOSIS — E1165 Type 2 diabetes mellitus with hyperglycemia: Secondary | ICD-10-CM | POA: Diagnosis not present

## 2016-06-21 DIAGNOSIS — E119 Type 2 diabetes mellitus without complications: Secondary | ICD-10-CM | POA: Diagnosis not present

## 2016-06-23 DIAGNOSIS — E119 Type 2 diabetes mellitus without complications: Secondary | ICD-10-CM | POA: Diagnosis not present

## 2016-06-23 DIAGNOSIS — Z833 Family history of diabetes mellitus: Secondary | ICD-10-CM | POA: Diagnosis not present

## 2016-06-23 DIAGNOSIS — R609 Edema, unspecified: Secondary | ICD-10-CM | POA: Diagnosis not present

## 2016-06-23 DIAGNOSIS — K219 Gastro-esophageal reflux disease without esophagitis: Secondary | ICD-10-CM | POA: Diagnosis not present

## 2016-06-23 DIAGNOSIS — E7800 Pure hypercholesterolemia, unspecified: Secondary | ICD-10-CM | POA: Diagnosis not present

## 2016-06-30 ENCOUNTER — Ambulatory Visit: Payer: PPO | Admitting: Sports Medicine

## 2016-07-04 ENCOUNTER — Ambulatory Visit: Payer: Self-pay

## 2016-07-07 NOTE — Progress Notes (Signed)
Please place orders in EPIC as patient is being scheduled for a Pre-op appointment! Thank you! 

## 2016-07-11 ENCOUNTER — Encounter: Payer: Self-pay | Admitting: Skilled Nursing Facility1

## 2016-07-11 ENCOUNTER — Encounter: Payer: PPO | Attending: Surgery | Admitting: Skilled Nursing Facility1

## 2016-07-11 DIAGNOSIS — E119 Type 2 diabetes mellitus without complications: Secondary | ICD-10-CM | POA: Insufficient documentation

## 2016-07-11 DIAGNOSIS — E118 Type 2 diabetes mellitus with unspecified complications: Secondary | ICD-10-CM | POA: Diagnosis not present

## 2016-07-11 DIAGNOSIS — Z7984 Long term (current) use of oral hypoglycemic drugs: Secondary | ICD-10-CM | POA: Insufficient documentation

## 2016-07-11 DIAGNOSIS — J9801 Acute bronchospasm: Secondary | ICD-10-CM | POA: Diagnosis not present

## 2016-07-11 DIAGNOSIS — Z79899 Other long term (current) drug therapy: Secondary | ICD-10-CM | POA: Insufficient documentation

## 2016-07-11 DIAGNOSIS — E1100 Type 2 diabetes mellitus with hyperosmolarity without nonketotic hyperglycemic-hyperosmolar coma (NKHHC): Secondary | ICD-10-CM

## 2016-07-11 DIAGNOSIS — Z713 Dietary counseling and surveillance: Secondary | ICD-10-CM | POA: Insufficient documentation

## 2016-07-11 DIAGNOSIS — J209 Acute bronchitis, unspecified: Secondary | ICD-10-CM | POA: Diagnosis not present

## 2016-07-11 DIAGNOSIS — M109 Gout, unspecified: Secondary | ICD-10-CM | POA: Diagnosis not present

## 2016-07-11 NOTE — Progress Notes (Signed)
  Pre-Operative Nutrition Class:  Appt start time: 6381   End time:  1830.  Patient was seen on 07/11/2016 for Pre-Operative [facility]ric [facility]   Surgery date: 07/26/16 Surgery type: Sleeve Gastrectomy  Start weight at [facility]: 417 Weight today: 413.7  [place]AN[place]  BODY COMP RESULTS     BMI (kg/m^2)    Fat Mass (lbs)    Fat Free Mass (lbs)    Total Body Water (lbs)    Samples given per MNT protocol. Patient educated on appropriate usage: Bariatric Advantage Calcium Citrate Lot #[id number] Exp: 08/03/2016  Premier Shake Lot # [id number] Exp: 02/11/2017  Opurity Multivitamin: Lot#: [id number]: 06/2017  The following the learning objectives were met by the patient during this course:  Identify Pre-Op Dietary Goals and will begin 2 weeks pre-operatively  Identify appropriate sources of fluids and proteins   State protein recommendations and appropriate sources pre and post-operatively  Identify Post-Operative Dietary Goals and will follow for 2 weeks post-operatively  Identify appropriate multivitamin and calcium sources  Describe the need for physical activity post-operatively and will follow MD recommendations  State when to call healthcare provider regarding medication questions or post-operative complications  Handouts given during class include:  Pre-Op Bariatric Surgery Diet Handout  Protein Shake Handout  Post-Op Bariatric Surgery Nutrition Handout  BELT Program Information Flyer  Support Group Information Flyer  [facility] Bariatric Supplements Price List  [facility]: [facility] per MD.

## 2016-07-15 ENCOUNTER — Ambulatory Visit: Payer: Self-pay | Admitting: Family Medicine

## 2016-07-19 ENCOUNTER — Telehealth: Payer: Self-pay | Admitting: *Deleted

## 2016-07-19 DIAGNOSIS — B3731 Acute candidiasis of vulva and vagina: Secondary | ICD-10-CM

## 2016-07-19 DIAGNOSIS — N760 Acute vaginitis: Secondary | ICD-10-CM

## 2016-07-19 DIAGNOSIS — B373 Candidiasis of vulva and vagina: Secondary | ICD-10-CM

## 2016-07-19 MED ORDER — FLUCONAZOLE 200 MG PO TABS: 200.0000 mg | ORAL_TABLET | Freq: Once | ORAL | 0 refills | Status: AC

## 2016-07-19 MED ORDER — METRONIDAZOLE 500 MG PO TABS: 500.0000 mg | ORAL_TABLET | Freq: Two times a day (BID) | ORAL | 0 refills | Status: DC

## 2016-07-19 NOTE — Telephone Encounter (Signed)
-----   Message from Blanchie Dessert, [place] sent at 07/19/2016  8:40 AM EDT ----- Regarding: Rx for yeast infection Contact: [phone] Pt [facility] [place]

## 2016-07-19 NOTE — Telephone Encounter (Signed)
Pt called back in regards to vaginal discharge that she had been experiencing.  States she gets vaginitis frequently and is scheduled for surgery next week and was requesting Flagyl to be sent in as well with the Diflucan.  Rx sent to pharmacy.

## 2016-07-19 NOTE — Telephone Encounter (Signed)
Pt has history of recurrent yeast infections, used Diflucan 200mg  in the past, rx sent to pharmacy per Dr Hulan Fray order.

## 2016-07-21 ENCOUNTER — Ambulatory Visit: Payer: Self-pay | Admitting: Surgery

## 2016-07-21 ENCOUNTER — Encounter (HOSPITAL_COMMUNITY): Payer: Self-pay

## 2016-07-21 NOTE — Patient Instructions (Addendum)
Maria [name]  07/21/2016   Your procedure is scheduled on: 07/26/16  Report to [facility] Main  Entrance Take [place]  elevators to 3rd floor to  [place] at     [place] AM.    Call this number if you have problems the morning of surgery [phone]   Remember: ONLY 1 PERSON MAY GO WITH YOU TO SHORT STAY TO GET  READY MORNING OF YOUR SURGERY.  Do not eat food or drink liquids :After Midnight.     Take these medicines the morning of surgery with A SIP OF WATER:  Protonix, oxycodone if needed, Xanax if needed, Spiriva if needed, metoprolol, Levemir 50% of  Your am dose for type 2 diabetics.  Bring Proair to hospital with you.                    How to Manage Your Diabetes Before and After Surgery  Why is it important to control my blood sugar before and after surgery? . Improving blood sugar levels before and after surgery helps healing and can limit problems. . A way of improving blood sugar control is eating a healthy diet by: o  Eating less sugar and carbohydrates o  Increasing activity/exercise o  Talking with your doctor about reaching your blood sugar goals . High blood sugars (greater than 180 mg/dL) can raise your risk of infections and slow your recovery, so you will need to focus on controlling your diabetes during the weeks before surgery. . Make sure that the doctor who takes care of your diabetes knows about your planned surgery including the date and location.  How do I manage my blood sugar before surgery? . Check your blood sugar at least 4 times a day, starting 2 days before surgery, to make sure that the level is not too high or low. o Check your blood sugar the morning of your surgery when you wake up and every 2 hours until you get to the Short Stay unit. . If your blood sugar is less than 70 mg/dL, you will need to treat for low blood sugar: o Do not take insulin. o Treat a low blood sugar (less than 70 mg/dL) with  cup of clear  juice (cranberry or apple), 4 glucose tablets, OR glucose gel. o Recheck blood sugar in 15 minutes after treatment (to make sure it is greater than 70 mg/dL). If your blood sugar is not greater than 70 mg/dL on recheck, call [phone] for further instructions. . Report your blood sugar to the short stay nurse when you get to Short Stay.  . If you are admitted to the hospital after surgery: o Your blood sugar will be checked by the staff and you will probably be given insulin after surgery (instead of oral diabetes medicines) to make sure you have good blood sugar levels. o The goal for blood sugar control after surgery is 80-180 mg/dL.   WHAT DO I DO ABOUT MY DIABETES MEDICATION?  Marland Kitchen Do not take oral diabetes medicines (pills) the morning of surgery.  . THE NIGHT BEFORE SURGERY, take    50%  of Levemir  Insulin dose.      . THE MORNING OF SURGERY, take  50%  of Levamir Insulin dose.  . The day of surgery, do not take other diabetes injectables, including Byetta (exenatide), Bydureon (exenatide ER), Victoza (liraglutide), or Trulicity (dulaglutide).  Patient Signature:  Date:   Nurse Signature:  Date:   Reviewed and Endorsed by [facility] Patient Education Committee, August 2015             You may not have any metal on your body including hair pins and              piercings  Do not wear jewelry, make-up, lotions, powders or perfumes, deodorant             Do not wear nail polish.  Do not shave  48 hours prior to surgery.                Do not bring valuables to the hospital. [place].  Contacts, dentures or bridgework may not be worn into surgery.  Leave suitcase in the car. After surgery it may be brought to your room.               Please read over the following fact sheets you were given: _____________________________________________________________________             [facility] - Preparing for Surgery Before  surgery, you can play an important role.  Because skin is not sterile, your skin needs to be as free of germs as possible.  You can reduce the number of germs on your skin by washing with CHG (chlorahexidine gluconate) soap before surgery.  CHG is an antiseptic cleaner which kills germs and bonds with the skin to continue killing germs even after washing. Please DO NOT use if you have an allergy to CHG or antibacterial soaps.  If your skin becomes reddened/irritated stop using the CHG and inform your nurse when you arrive at Short Stay. Do not shave (including legs and underarms) for at least 48 hours prior to the first CHG shower.  You may shave your face/neck. Please follow these instructions carefully:  1.  Shower with CHG Soap the night before surgery and the  morning of Surgery.  2.  If you choose to wash your hair, wash your hair first as usual with your  normal  shampoo.  3.  After you shampoo, rinse your hair and body thoroughly to remove the  shampoo.                           4.  Use CHG as you would any other liquid soap.  You can apply chg directly  to the skin and wash                       Gently with a scrungie or clean washcloth.  5.  Apply the CHG Soap to your body ONLY FROM THE NECK DOWN.   Do not use on face/ open                           Wound or open sores. Avoid contact with eyes, ears mouth and genitals (private parts).                       Wash face,  Genitals (private parts) with your normal soap.             6.  Wash thoroughly, paying special attention to the area where your surgery  will be performed.  7.  Thoroughly rinse your body with warm water from the neck down.  8.  DO NOT shower/wash with your normal soap after using and rinsing off  the CHG Soap.                9.  Pat yourself dry with a clean towel.            10.  Wear clean pajamas.            11.  Place clean sheets on your bed the night of your first shower and do not  sleep with pets. Day of Surgery  : Do not apply any lotions/deodorants the morning of surgery.  Please wear clean clothes to the hospital/surgery center.  FAILURE TO FOLLOW THESE INSTRUCTIONS MAY RESULT IN THE CANCELLATION OF YOUR SURGERY PATIENT SIGNATURE_________________________________  NURSE SIGNATURE__________________________________  ________________________________________________________________________

## 2016-07-22 ENCOUNTER — Ambulatory Visit (INDEPENDENT_AMBULATORY_CARE_PROVIDER_SITE_OTHER): Payer: PPO | Admitting: Family Medicine

## 2016-07-22 ENCOUNTER — Encounter (HOSPITAL_COMMUNITY)
Admission: RE | Admit: 2016-07-22 | Discharge: 2016-07-22 | Disposition: A | Discharge status: Home / Self Care | Payer: PPO | Source: Ambulatory Visit | Attending: Surgery | Admitting: Surgery

## 2016-07-22 ENCOUNTER — Encounter (INDEPENDENT_AMBULATORY_CARE_PROVIDER_SITE_OTHER): Payer: Self-pay

## 2016-07-22 ENCOUNTER — Encounter: Payer: Self-pay | Admitting: Family Medicine

## 2016-07-22 ENCOUNTER — Encounter (HOSPITAL_COMMUNITY): Payer: Self-pay

## 2016-07-22 ENCOUNTER — Other Ambulatory Visit (HOSPITAL_COMMUNITY): Payer: Self-pay

## 2016-07-22 DIAGNOSIS — M25572 Pain in left ankle and joints of left foot: Secondary | ICD-10-CM

## 2016-07-22 DIAGNOSIS — Z01812 Encounter for preprocedural laboratory examination: Secondary | ICD-10-CM | POA: Diagnosis not present

## 2016-07-22 DIAGNOSIS — G8929 Other chronic pain: Secondary | ICD-10-CM | POA: Diagnosis not present

## 2016-07-22 HISTORY — DX: Pneumonia, unspecified organism: J18.9

## 2016-07-22 HISTORY — DX: Personal history of other diseases of the digestive system: Z87.19

## 2016-07-22 HISTORY — DX: Cardiac arrhythmia, unspecified: I49.9

## 2016-07-22 HISTORY — DX: Personal history of urinary calculi: Z87.442

## 2016-07-22 LAB — CBC WITH DIFFERENTIAL/PLATELET
Basophils Absolute: 0 10*3/uL (ref 0.0–0.1)
Basophils Relative: 0 %
Eosinophils Absolute: 0.1 10*3/uL (ref 0.0–0.7)
Eosinophils Relative: 1 %
HCT: 39.2 % (ref 36.0–46.0)
Hemoglobin: 12.5 g/dL (ref 12.0–15.0)
Lymphocytes Relative: 36 %
Lymphs Abs: 2.2 10*3/uL (ref 0.7–4.0)
MCH: 28.5 pg (ref 26.0–34.0)
MCHC: 31.9 g/dL (ref 30.0–36.0)
MCV: 89.5 fL (ref 78.0–100.0)
Monocytes Absolute: 0.3 10*3/uL (ref 0.1–1.0)
Monocytes Relative: 5 %
Neutro Abs: 3.5 10*3/uL (ref 1.7–7.7)
Neutrophils Relative %: 58 %
Platelets: 319 10*3/uL (ref 150–400)
RBC: 4.38 MIL/uL (ref 3.87–5.11)
RDW: 15.7 % — ABNORMAL HIGH (ref 11.5–15.5)
WBC: 6 10*3/uL (ref 4.0–10.5)

## 2016-07-22 LAB — COMPREHENSIVE METABOLIC PANEL
ALT: 25 U/L (ref 14–54)
AST: 19 U/L (ref 15–41)
Albumin: 3.5 g/dL (ref 3.5–5.0)
Alkaline Phosphatase: 76 U/L (ref 38–126)
Anion gap: 8 (ref 5–15)
BUN: 18 mg/dL (ref 6–20)
CO2: 29 mmol/L (ref 22–32)
Calcium: 9 mg/dL (ref 8.9–10.3)
Chloride: 105 mmol/L (ref 101–111)
Creatinine, Ser: 1.05 mg/dL — ABNORMAL HIGH (ref 0.44–1.00)
GFR calc Af Amer: >60 mL/min (ref >60)
GFR calc non Af Amer: 58 mL/min — ABNORMAL LOW (ref >60)
Glucose, Bld: 234 mg/dL — ABNORMAL HIGH (ref 65–99)
Potassium: 3.8 mmol/L (ref 3.5–5.1)
Sodium: 142 mmol/L (ref 135–145)
Total Bilirubin: 0.8 mg/dL (ref 0.3–1.2)
Total Protein: 7.4 g/dL (ref 6.5–8.1)

## 2016-07-22 LAB — SURGICAL PCR SCREEN
MRSA, PCR: POSITIVE — AB
Staphylococcus aureus: POSITIVE — AB

## 2016-07-22 LAB — GLUCOSE, CAPILLARY: Glucose-Capillary: 234 mg/dL — ABNORMAL HIGH (ref 65–99)

## 2016-07-22 NOTE — Pre-Procedure Instructions (Addendum)
02-17-16 EKG (epic) 11-11-15 CXR (epic)  Requested bariatric bed with trapeze

## 2016-07-22 NOTE — Progress Notes (Signed)
  Maria [name] - 56 y.o. female MRN [id number]  Date of birth: 26-Jan-1961  SUBJECTIVE:  Including CC & ROS.  CC: left ankle pain/morbid obesity  Presents for left ankle pain and also for obesity. She is having a gastric sleeve procedure done on Tuesday. She is wondering if she can get a bariatric shower chair. Reports that her PCP stated that Dr. Nori Riis had done this before and should do it again. Regards her left ankle pain she believes this will get better with the surgery being done and decreased weight. She is wearing flip-flops today. She has been told in the past not wear these. She will wear sneakers in the future.   ROS: No unexpected weight loss, fever, chills, swelling, instability, muscle pain, numbness/tingling, redness, otherwise see HPI   PMHx - Updated and reviewed.  Contributory factors include: Morbid obesity, diabetes mellitus, hypertension, GERD, hyperlipidemia PSHx - Updated and reviewed.  Contributory factors include:   bilateral knee replacements FHx - Updated and reviewed.  Contributory factors include:  Negative Social Hx - Updated and reviewed. Contributory factors include: Negative Medications - reviewed   DATA REVIEWED: Previous office visits  PHYSICAL EXAM:  VS: BP:(!) 158/93  HR: bpm  TEMP: ( )  RESP:   HT:5\' 9"  (175.3 cm)   WT:(!) 398 lb (180.5 kg)  BMI:58.9 PHYSICAL EXAM: Gen: NAD, alert, cooperative with exam, well-appearing HEENT: clear conjunctiva,  CV:  no edema, capillary refill brisk, normal rate Resp: non-labored Skin: no rashes, normal turgor  Neuro: no gross deficits.  Psych:  alert and oriented  Ankle & Foot: Positive swelling diffusely throughout the left ankle. She has bilateral pes planus No visible ecchymosis, erythema, ulcers, calluses, blister Achilles tendon with tenderness to palpation Positive swelling of retrocalcaneal bursa Limited in plantarflexion, dorsiflexion, inversion, and eversion of the foot secondary to  swelling Strength: 5/5 in all directions. Sensation: intact Vascular: intact w/ dorsalis pedis & posterior tibialis pulses 2+    ASSESSMENT & PLAN:   MORBID OBESITY Bariatric shower chair  prescription written.  Gastric sleeve procedure next week.   Left ankle pain Recommended not wearing flip-flops. Can consider inserts in the future. She is not wearing the appropriate shoes for this right now. Stated that cortisone injection at the Achilles tendon not indicated as it can increase chances for rupture.

## 2016-07-22 NOTE — Assessment & Plan Note (Signed)
Recommended not wearing flip-flops. Can consider inserts in the future. She is not wearing the appropriate shoes for this right now. Stated that cortisone injection at the Achilles tendon not indicated as it can increase chances for rupture.

## 2016-07-22 NOTE — Assessment & Plan Note (Signed)
Bariatric shower chair  prescription written.  Gastric sleeve procedure next week.

## 2016-07-23 LAB — HEMOGLOBIN A1C
Hgb A1c MFr Bld: 10.1 % — ABNORMAL HIGH (ref 4.8–5.6)
Mean Plasma Glucose: 243 mg/dL

## 2016-07-25 NOTE — Progress Notes (Signed)
Hgb a1c done 07/22/16 routed to Dr. Hassell Done via epic

## 2016-07-26 ENCOUNTER — Inpatient Hospital Stay (HOSPITAL_COMMUNITY): Payer: PPO | Admitting: Registered Nurse

## 2016-07-26 ENCOUNTER — Encounter (HOSPITAL_COMMUNITY)
Admission: RE | Disposition: A | Discharge status: Home / Self Care | Payer: Self-pay | Source: Ambulatory Visit | Attending: Surgery

## 2016-07-26 ENCOUNTER — Encounter (HOSPITAL_COMMUNITY): Payer: Self-pay

## 2016-07-26 ENCOUNTER — Inpatient Hospital Stay (HOSPITAL_COMMUNITY)
Admission: RE | Admit: 2016-07-26 | Discharge: 2016-07-27 | DRG: 621 | Disposition: A | Discharge status: Home / Self Care | Payer: PPO | Source: Ambulatory Visit | Attending: Surgery | Admitting: Surgery

## 2016-07-26 DIAGNOSIS — Z6841 Body Mass Index (BMI) 40.0 and over, adult: Secondary | ICD-10-CM | POA: Diagnosis not present

## 2016-07-26 DIAGNOSIS — K219 Gastro-esophageal reflux disease without esophagitis: Secondary | ICD-10-CM | POA: Diagnosis not present

## 2016-07-26 DIAGNOSIS — Z96653 Presence of artificial knee joint, bilateral: Secondary | ICD-10-CM | POA: Diagnosis not present

## 2016-07-26 DIAGNOSIS — Z23 Encounter for immunization: Secondary | ICD-10-CM | POA: Diagnosis not present

## 2016-07-26 DIAGNOSIS — I10 Essential (primary) hypertension: Secondary | ICD-10-CM | POA: Diagnosis not present

## 2016-07-26 DIAGNOSIS — K317 Polyp of stomach and duodenum: Secondary | ICD-10-CM | POA: Diagnosis not present

## 2016-07-26 DIAGNOSIS — K449 Diaphragmatic hernia without obstruction or gangrene: Secondary | ICD-10-CM | POA: Diagnosis not present

## 2016-07-26 DIAGNOSIS — Z9884 Bariatric surgery status: Secondary | ICD-10-CM

## 2016-07-26 DIAGNOSIS — E119 Type 2 diabetes mellitus without complications: Secondary | ICD-10-CM | POA: Diagnosis present

## 2016-07-26 DIAGNOSIS — G4733 Obstructive sleep apnea (adult) (pediatric): Secondary | ICD-10-CM | POA: Diagnosis not present

## 2016-07-26 HISTORY — PX: LAPAROSCOPIC GASTRIC SLEEVE RESECTION WITH HIATAL HERNIA REPAIR: SHX6512

## 2016-07-26 LAB — HEMOGLOBIN AND HEMATOCRIT, BLOOD
HCT: 39.3 % (ref 36.0–46.0)
Hemoglobin: 12.6 g/dL (ref 12.0–15.0)

## 2016-07-26 LAB — CBC
HCT: 37.8 % (ref 36.0–46.0)
Hemoglobin: 12 g/dL (ref 12.0–15.0)
MCH: 28 pg (ref 26.0–34.0)
MCHC: 31.7 g/dL (ref 30.0–36.0)
MCV: 88.1 fL (ref 78.0–100.0)
Platelets: 297 10*3/uL (ref 150–400)
RBC: 4.29 MIL/uL (ref 3.87–5.11)
RDW: 15.6 % — ABNORMAL HIGH (ref 11.5–15.5)
WBC: 9 10*3/uL (ref 4.0–10.5)

## 2016-07-26 LAB — GLUCOSE, CAPILLARY
Glucose-Capillary: 191 mg/dL — ABNORMAL HIGH (ref 65–99)
Glucose-Capillary: 303 mg/dL — ABNORMAL HIGH (ref 65–99)
Glucose-Capillary: 284 mg/dL — ABNORMAL HIGH (ref 65–99)
Glucose-Capillary: 245 mg/dL — ABNORMAL HIGH (ref 65–99)
Glucose-Capillary: 276 mg/dL — ABNORMAL HIGH (ref 65–99)
Glucose-Capillary: 275 mg/dL — ABNORMAL HIGH (ref 65–99)

## 2016-07-26 LAB — CREATININE, SERUM
Creatinine, Ser: 1.27 mg/dL — ABNORMAL HIGH (ref 0.44–1.00)
GFR calc Af Amer: 53 mL/min — ABNORMAL LOW (ref >60)
GFR calc non Af Amer: 46 mL/min — ABNORMAL LOW (ref >60)

## 2016-07-26 SURGERY — GASTRECTOMY, SLEEVE, LAPAROSCOPIC, WITH HIATAL HERNIA REPAIR
Anesthesia: General | Site: Abdomen

## 2016-07-26 MED ORDER — MIDAZOLAM HCL 5 MG/5ML IJ SOLN
INTRAMUSCULAR | Status: DC | PRN
  Administered 2016-07-26: 2 mg via INTRAVENOUS

## 2016-07-26 MED ORDER — PROPOFOL 10 MG/ML IV BOLUS
INTRAVENOUS | Status: DC | PRN
  Administered 2016-07-26: 250 mg via INTRAVENOUS

## 2016-07-26 MED ORDER — OXYCODONE HCL 5 MG/5ML PO SOLN
5.0000 mg | ORAL | Status: DC | PRN
  Administered 2016-07-26 – 2016-07-27 (×2): 5 mg via ORAL
  Administered 2016-07-27: 10 mg via ORAL
  Administered 2016-07-27: 5 mg via ORAL
  Administered 2016-07-27: 10 mg via ORAL
  Administered 2016-07-27: 5 mg via ORAL
  Filled 2016-07-26: qty 5
  Filled 2016-07-26: qty 10
  Filled 2016-07-26 (×4): qty 5
  Filled 2016-07-26: qty 10

## 2016-07-26 MED ORDER — ONDANSETRON HCL 4 MG/2ML IJ SOLN: 4.0000 mg | Freq: Four times a day (QID) | INTRAMUSCULAR | Status: DC | PRN

## 2016-07-26 MED ORDER — HYDROMORPHONE HCL 2 MG/ML IJ SOLN
0.2500 mg | INTRAMUSCULAR | Status: DC | PRN
  Administered 2016-07-26 (×2): 0.5 mg via INTRAVENOUS

## 2016-07-26 MED ORDER — ROCURONIUM BROMIDE 50 MG/5ML IV SOSY
PREFILLED_SYRINGE | INTRAVENOUS | Status: AC
  Filled 2016-07-26: qty 5

## 2016-07-26 MED ORDER — INSULIN ASPART 100 UNIT/ML ~~LOC~~ SOLN
0.0000 [IU] | SUBCUTANEOUS | Status: DC
  Administered 2016-07-26: 15 [IU] via SUBCUTANEOUS
  Administered 2016-07-26: 11 [IU] via SUBCUTANEOUS
  Administered 2016-07-26: 7 [IU] via SUBCUTANEOUS
  Administered 2016-07-27: 4 [IU] via SUBCUTANEOUS
  Administered 2016-07-27: 7 [IU] via SUBCUTANEOUS
  Administered 2016-07-27: 11 [IU] via SUBCUTANEOUS
  Administered 2016-07-27: 7 [IU] via SUBCUTANEOUS

## 2016-07-26 MED ORDER — PROPOFOL 10 MG/ML IV BOLUS
INTRAVENOUS | Status: AC
  Filled 2016-07-26: qty 20

## 2016-07-26 MED ORDER — SUCCINYLCHOLINE CHLORIDE 200 MG/10ML IV SOSY
PREFILLED_SYRINGE | INTRAVENOUS | Status: AC
  Filled 2016-07-26: qty 10

## 2016-07-26 MED ORDER — HEPARIN SODIUM (PORCINE) 5000 UNIT/ML IJ SOLN
5000.0000 [IU] | Freq: Three times a day (TID) | INTRAMUSCULAR | Status: DC
  Administered 2016-07-26 – 2016-07-27 (×3): 5000 [IU] via SUBCUTANEOUS
  Filled 2016-07-26 (×3): qty 1

## 2016-07-26 MED ORDER — CELECOXIB 200 MG PO CAPS
400.0000 mg | ORAL_CAPSULE | ORAL | Status: AC
  Administered 2016-07-26: 400 mg via ORAL
  Filled 2016-07-26: qty 2

## 2016-07-26 MED ORDER — APREPITANT 40 MG PO CAPS
40.0000 mg | ORAL_CAPSULE | ORAL | Status: AC
  Administered 2016-07-26: 40 mg via ORAL
  Filled 2016-07-26: qty 1

## 2016-07-26 MED ORDER — LIDOCAINE 2% (20 MG/ML) 5 ML SYRINGE
INTRAMUSCULAR | Status: AC
  Filled 2016-07-26: qty 5

## 2016-07-26 MED ORDER — LACTATED RINGERS IV SOLN
INTRAVENOUS | Status: DC
  Administered 2016-07-26: 11:00:00 via INTRAVENOUS

## 2016-07-26 MED ORDER — ACETAMINOPHEN 500 MG PO TABS
1000.0000 mg | ORAL_TABLET | ORAL | Status: AC
  Administered 2016-07-26: 1000 mg via ORAL
  Filled 2016-07-26: qty 2

## 2016-07-26 MED ORDER — ONDANSETRON HCL 4 MG/2ML IJ SOLN
INTRAMUSCULAR | Status: AC
  Filled 2016-07-26: qty 2

## 2016-07-26 MED ORDER — PANTOPRAZOLE SODIUM 40 MG IV SOLR
40.0000 mg | Freq: Every day | INTRAVENOUS | Status: DC
  Administered 2016-07-26: 40 mg via INTRAVENOUS
  Filled 2016-07-26: qty 40

## 2016-07-26 MED ORDER — ACETAMINOPHEN 325 MG PO TABS: 650.0000 mg | ORAL_TABLET | ORAL | Status: DC | PRN

## 2016-07-26 MED ORDER — FENTANYL CITRATE (PF) 100 MCG/2ML IJ SOLN
INTRAMUSCULAR | Status: AC
  Filled 2016-07-26: qty 2

## 2016-07-26 MED ORDER — BUPIVACAINE LIPOSOME 1.3 % IJ SUSP
20.0000 mL | Freq: Once | INTRAMUSCULAR | Status: AC
  Administered 2016-07-26: 20 mL
  Filled 2016-07-26: qty 20

## 2016-07-26 MED ORDER — LIDOCAINE 2% (20 MG/ML) 5 ML SYRINGE
INTRAMUSCULAR | Status: DC | PRN
  Administered 2016-07-26: 100 mg via INTRAVENOUS

## 2016-07-26 MED ORDER — PNEUMOCOCCAL VAC POLYVALENT 25 MCG/0.5ML IJ INJ
0.5000 mL | INJECTION | INTRAMUSCULAR | Status: AC
  Administered 2016-07-27: 0.5 mL via INTRAMUSCULAR
  Filled 2016-07-26: qty 0.5

## 2016-07-26 MED ORDER — SODIUM CHLORIDE 0.9 % IJ SOLN
INTRAMUSCULAR | Status: DC | PRN
  Administered 2016-07-26: 10 mL

## 2016-07-26 MED ORDER — ONDANSETRON HCL 4 MG/2ML IJ SOLN: 4.0000 mg | INTRAMUSCULAR | Status: DC | PRN

## 2016-07-26 MED ORDER — OXYCODONE HCL 5 MG PO TABS: 5.0000 mg | ORAL_TABLET | Freq: Once | ORAL | Status: DC | PRN

## 2016-07-26 MED ORDER — PREMIER PROTEIN SHAKE
2.0000 [oz_av] | ORAL | Status: DC
  Administered 2016-07-27 (×3): 2 [oz_av] via ORAL

## 2016-07-26 MED ORDER — MIDAZOLAM HCL 2 MG/2ML IJ SOLN
INTRAMUSCULAR | Status: AC
  Filled 2016-07-26: qty 2

## 2016-07-26 MED ORDER — ROCURONIUM BROMIDE 10 MG/ML (PF) SYRINGE
PREFILLED_SYRINGE | INTRAVENOUS | Status: DC | PRN
  Administered 2016-07-26: 20 mg via INTRAVENOUS
  Administered 2016-07-26: 50 mg via INTRAVENOUS
  Administered 2016-07-26: 5 mg via INTRAVENOUS
  Administered 2016-07-26: 20 mg via INTRAVENOUS
  Administered 2016-07-26: 10 mg via INTRAVENOUS

## 2016-07-26 MED ORDER — DEXAMETHASONE SODIUM PHOSPHATE 10 MG/ML IJ SOLN
INTRAMUSCULAR | Status: AC
  Filled 2016-07-26: qty 1

## 2016-07-26 MED ORDER — INSULIN ASPART 100 UNIT/ML ~~LOC~~ SOLN
SUBCUTANEOUS | Status: DC
Start: 2016-07-26 — End: 2016-07-26
  Filled 2016-07-26: qty 1

## 2016-07-26 MED ORDER — SODIUM CHLORIDE 0.9 % IJ SOLN
INTRAMUSCULAR | Status: AC
  Filled 2016-07-26: qty 10

## 2016-07-26 MED ORDER — 0.9 % SODIUM CHLORIDE (POUR BTL) OPTIME
TOPICAL | Status: DC | PRN
  Administered 2016-07-26: 1000 mL

## 2016-07-26 MED ORDER — MORPHINE SULFATE (PF) 10 MG/ML IV SOLN
1.0000 mg | INTRAVENOUS | Status: DC | PRN
  Administered 2016-07-26: 1 mg via INTRAVENOUS
  Filled 2016-07-26: qty 1

## 2016-07-26 MED ORDER — HYDROMORPHONE HCL 2 MG/ML IJ SOLN
INTRAMUSCULAR | Status: AC
  Filled 2016-07-26: qty 1

## 2016-07-26 MED ORDER — LACTATED RINGERS IR SOLN
Status: DC | PRN
  Administered 2016-07-26: 1000 mL

## 2016-07-26 MED ORDER — SCOPOLAMINE 1 MG/3DAYS TD PT72
1.0000 | MEDICATED_PATCH | TRANSDERMAL | Status: DC
  Administered 2016-07-26: 1.5 mg via TRANSDERMAL
  Filled 2016-07-26: qty 1

## 2016-07-26 MED ORDER — DEXAMETHASONE SODIUM PHOSPHATE 4 MG/ML IJ SOLN
4.0000 mg | INTRAMUSCULAR | Status: DC
  Filled 2016-07-26: qty 1

## 2016-07-26 MED ORDER — MUPIROCIN 2 % EX OINT
1.0000 "application " | TOPICAL_OINTMENT | Freq: Two times a day (BID) | CUTANEOUS | Status: DC
  Administered 2016-07-26 – 2016-07-27 (×2): 1 via NASAL
  Filled 2016-07-26: qty 22

## 2016-07-26 MED ORDER — SUGAMMADEX SODIUM 500 MG/5ML IV SOLN
INTRAVENOUS | Status: AC
  Filled 2016-07-26: qty 5

## 2016-07-26 MED ORDER — FENTANYL CITRATE (PF) 100 MCG/2ML IJ SOLN
INTRAMUSCULAR | Status: DC | PRN
  Administered 2016-07-26 (×3): 50 ug via INTRAVENOUS

## 2016-07-26 MED ORDER — GABAPENTIN 300 MG PO CAPS
300.0000 mg | ORAL_CAPSULE | ORAL | Status: AC
  Administered 2016-07-26: 300 mg via ORAL
  Filled 2016-07-26: qty 1

## 2016-07-26 MED ORDER — HEPARIN SODIUM (PORCINE) 5000 UNIT/ML IJ SOLN
5000.0000 [IU] | INTRAMUSCULAR | Status: AC
  Administered 2016-07-26: 5000 [IU] via SUBCUTANEOUS
  Filled 2016-07-26: qty 1

## 2016-07-26 MED ORDER — ONDANSETRON HCL 4 MG/2ML IJ SOLN
INTRAMUSCULAR | Status: DC | PRN
  Administered 2016-07-26: 4 mg via INTRAVENOUS

## 2016-07-26 MED ORDER — CHLORHEXIDINE GLUCONATE CLOTH 2 % EX PADS: 6.0000 | MEDICATED_PAD | Freq: Once | CUTANEOUS | Status: DC

## 2016-07-26 MED ORDER — KCL IN DEXTROSE-NACL 20-5-0.45 MEQ/L-%-% IV SOLN
INTRAVENOUS | Status: DC
  Administered 2016-07-26: 100 mL/h via INTRAVENOUS
  Administered 2016-07-27: 12:00:00 via INTRAVENOUS
  Administered 2016-07-27: 100 mL/h via INTRAVENOUS
  Filled 2016-07-26 (×3): qty 1000

## 2016-07-26 MED ORDER — SUGAMMADEX SODIUM 500 MG/5ML IV SOLN
INTRAVENOUS | Status: DC | PRN
  Administered 2016-07-26: 400 mg via INTRAVENOUS

## 2016-07-26 MED ORDER — CEFOTETAN DISODIUM-DEXTROSE 2-2.08 GM-% IV SOLR
2.0000 g | INTRAVENOUS | Status: AC
  Administered 2016-07-26: 2 g via INTRAVENOUS
  Filled 2016-07-26: qty 50

## 2016-07-26 MED ORDER — OXYCODONE HCL 5 MG/5ML PO SOLN: 5.0000 mg | Freq: Once | ORAL | Status: DC | PRN

## 2016-07-26 MED ORDER — ACETAMINOPHEN 160 MG/5ML PO SOLN: 325.0000 mg | ORAL | Status: DC | PRN

## 2016-07-26 MED ORDER — CHLORHEXIDINE GLUCONATE CLOTH 2 % EX PADS: 6.0000 | MEDICATED_PAD | Freq: Every day | CUTANEOUS | Status: DC

## 2016-07-26 MED ORDER — DEXAMETHASONE SODIUM PHOSPHATE 10 MG/ML IJ SOLN
INTRAMUSCULAR | Status: DC | PRN
  Administered 2016-07-26: 10 mg via INTRAVENOUS

## 2016-07-26 MED ORDER — SUCCINYLCHOLINE CHLORIDE 200 MG/10ML IV SOSY
PREFILLED_SYRINGE | INTRAVENOUS | Status: DC | PRN
  Administered 2016-07-26: 140 mg via INTRAVENOUS

## 2016-07-26 SURGICAL SUPPLY — 67 items
ADH SKN CLS APL DERMABOND .7 (GAUZE/BANDAGES/DRESSINGS) ×1
APL SRG 32X5 SNPLK LF DISP (MISCELLANEOUS)
APPLICATOR COTTON TIP 6IN STRL (MISCELLANEOUS) IMPLANT
APPLIER CLIP ROT 10 11.4 M/L (STAPLE)
APPLIER CLIP ROT 13.4 12 LRG (CLIP)
APR CLP LRG 13.4X12 ROT 20 MLT (CLIP)
APR CLP MED LRG 11.4X10 (STAPLE)
BAG SPEC RTRVL LRG 6X4 10 (ENDOMECHANICALS)
BLADE SURG 15 STRL LF DISP TIS (BLADE) ×1 IMPLANT
BLADE SURG 15 STRL SS (BLADE) ×2
CABLE HIGH FREQUENCY MONO STRZ (ELECTRODE) IMPLANT
CLIP APPLIE ROT 10 11.4 M/L (STAPLE) IMPLANT
CLIP APPLIE ROT 13.4 12 LRG (CLIP) IMPLANT
DERMABOND ADVANCED (GAUZE/BANDAGES/DRESSINGS) ×1
DERMABOND ADVANCED .7 DNX12 (GAUZE/BANDAGES/DRESSINGS) IMPLANT
DEVICE SUT QUICK LOAD TK 5 (STAPLE) ×2 IMPLANT
DEVICE SUT TI-KNOT TK 5X26 (MISCELLANEOUS) ×1 IMPLANT
DEVICE SUTURE ENDOST 10MM (ENDOMECHANICALS) ×1 IMPLANT
DEVICE TROCAR PUNCTURE CLOSURE (ENDOMECHANICALS) ×2 IMPLANT
DISSECTOR BLUNT TIP ENDO 5MM (MISCELLANEOUS) ×2 IMPLANT
ELECT REM PT RETURN 15FT ADLT (MISCELLANEOUS) ×2 IMPLANT
GAUZE SPONGE 4X4 12PLY STRL (GAUZE/BANDAGES/DRESSINGS) IMPLANT
GLOVE BIOGEL M 8.0 STRL (GLOVE) ×2 IMPLANT
GOWN STRL REUS W/TWL XL LVL3 (GOWN DISPOSABLE) ×6 IMPLANT
HANDLE STAPLE EGIA 4 XL (STAPLE) ×2 IMPLANT
HOVERMATT SINGLE USE (MISCELLANEOUS) ×2 IMPLANT
KIT BASIN OR (CUSTOM PROCEDURE TRAY) ×2 IMPLANT
MARKER SKIN DUAL TIP RULER LAB (MISCELLANEOUS) ×2 IMPLANT
NDL SPNL 22GX3.5 QUINCKE BK (NEEDLE) ×1 IMPLANT
NEEDLE SPNL 22GX3.5 QUINCKE BK (NEEDLE) ×2 IMPLANT
PACK UNIVERSAL I (CUSTOM PROCEDURE TRAY) ×2 IMPLANT
POUCH SPECIMEN RETRIEVAL 10MM (ENDOMECHANICALS) IMPLANT
RELOAD ENDO STITCH (ENDOMECHANICALS) IMPLANT
RELOAD STAPLE 45 PURP MED/THCK (STAPLE) IMPLANT
RELOAD SUT TRIPLE-STITCH 2-0 (ENDOMECHANICALS) IMPLANT
RELOAD TRI 45 ART MED THCK BLK (STAPLE) ×2 IMPLANT
RELOAD TRI 45 ART MED THCK PUR (STAPLE) IMPLANT
RELOAD TRI 60 ART MED THCK BLK (STAPLE) ×2 IMPLANT
RELOAD TRI 60 ART MED THCK PUR (STAPLE) ×4 IMPLANT
SCISSORS LAP 5X45 EPIX DISP (ENDOMECHANICALS) IMPLANT
SEALANT SURGICAL APPL DUAL CAN (MISCELLANEOUS) IMPLANT
SET IRRIG TUBING LAPAROSCOPIC (IRRIGATION / IRRIGATOR) ×1 IMPLANT
SHEARS HARMONIC ACE PLUS 45CM (MISCELLANEOUS) ×2 IMPLANT
SLEEVE ADV FIXATION 5X100MM (TROCAR) ×4 IMPLANT
SLEEVE GASTRECTOMY 36FR VISIGI (MISCELLANEOUS) ×2 IMPLANT
SOLUTION ANTI FOG 6CC (MISCELLANEOUS) ×2 IMPLANT
SPONGE LAP 18X18 X RAY DECT (DISPOSABLE) ×2 IMPLANT
STAPLER VISISTAT 35W (STAPLE) IMPLANT
SUT MNCRL AB 4-0 PS2 18 (SUTURE) ×2 IMPLANT
SUT SURGIDAC NAB ES-9 0 48 120 (SUTURE) ×2 IMPLANT
SUT VIC AB 4-0 SH 18 (SUTURE) ×2 IMPLANT
SUT VICRYL 0 TIES 12 18 (SUTURE) ×2 IMPLANT
SYR 10ML ECCENTRIC (SYRINGE) ×2 IMPLANT
SYR 20CC LL (SYRINGE) ×2 IMPLANT
SYR 50ML LL SCALE MARK (SYRINGE) ×2 IMPLANT
SYSTEM WECK SHIELD CLOSURE (TROCAR) IMPLANT
TOWEL OR 17X26 10 PK STRL BLUE (TOWEL DISPOSABLE) ×2 IMPLANT
TOWEL OR NON WOVEN STRL DISP B (DISPOSABLE) ×2 IMPLANT
TRAY FOLEY W/METER SILVER 16FR (SET/KITS/TRAYS/PACK) IMPLANT
TROCAR ADV FIXATION 12X100MM (TROCAR) ×2 IMPLANT
TROCAR ADV FIXATION 5X100MM (TROCAR) ×2 IMPLANT
TROCAR BLADELESS 15MM (ENDOMECHANICALS) ×2 IMPLANT
TROCAR BLADELESS OPT 5 100 (ENDOMECHANICALS) ×2 IMPLANT
TUBE CALIBRATION LAPBAND (TUBING) ×1 IMPLANT
TUBING CONNECTING 10 (TUBING) ×2 IMPLANT
TUBING ENDO SMARTCAP (MISCELLANEOUS) ×2 IMPLANT
TUBING INSUF HEATED (TUBING) ×2 IMPLANT

## 2016-07-26 NOTE — Interval H&P Note (Signed)
History and Physical Interval Note:  07/26/2016 10:53 AM  Maria [name]  has presented today for surgery, with the diagnosis of Morbid Obesity, Diabetes, OSA, HTN  The various methods of treatment have been discussed with the patient and family. After consideration of risks, benefits and other options for treatment, the patient has consented to  Procedure(s): LAPAROSCOPIC GASTRIC SLEEVE RESECTION WITH HIATAL HERNIA REPAIR, UPPER ENDO (N/A) as a surgical intervention .  The patient's history has been reviewed, patient examined, no change in status, stable for surgery.  I have reviewed the patient's chart and labs.  Questions were answered to the patient's satisfaction.     [name] B

## 2016-07-26 NOTE — Op Note (Addendum)
Surgeon: Kaylyn Lim, MD, FACS  Asst:  Romana Juniper, MD  Anes:  General endotracheal  Procedure: Lap posterior hiatal hernia repair. Laparoscopic sleeve gastrectomy and upper endoscopy  Diagnosis: Morbid obesity BMI 58  Complications: None noted  EBL:   minimal cc  Description of Procedure:  The patient was take to OR 1 and given general anesthesia.  The abdomen was prepped with Technicare and draped sterilely.  A timeout was performed.  The patient was MRSA positive.  Access to the abdomen was achieved with a 5 mm Optiview through the left upper quadrant.  Following insufflation, the state of the abdomen was found to be with some adhesions from her prior open chole.  She had an anterior hiatal dimple with a herniated fat pad.  This was taken down anteriorly and the right and left crura were completely dissected.  Two sutures were placed posteriorly and secured with Ty Knots  The ViSiGi 36Fr tube was inserted to deflate the stomach and was pulled back into the esophagus.    The pylorus was identified and we measured 5 cm back and marked the antrum.  At that point we began dissection to take down the greater curvature of the stomach using the Harmonic scalpel.  This dissection was taken all the way up to the left crus.  Posterior attachments of the stomach were also taken down.    The ViSiGi tube was then passed into the antrum and suction applied so that it was snug along the lessor curvature.  The "crow's foot" or incisura was identified.  The sleeve gastrectomy was begun using the [employer] stapler beginning with a 4.5 cm black load followed by a 6 cm black load and then 6 cm purple loads.  All had TRS.  When the sleeve was complete the tube was taken off suction and insufflated briefly.  The tube was withdrawn.  Upper endoscopy was then performed by Dr. Kae Heller and no leaks were noted.     The specimen was extracted through the 15 trocar site.  Wounds were infiltrated with  Exparel and closed with 4-0 monocryl.  The 15 mm port was closed with an Endoclose and 0 vicryl.  Catalina Antigua B. Hassell Done, [place], [facility], [place]

## 2016-07-26 NOTE — Anesthesia Postprocedure Evaluation (Signed)
Anesthesia Post Note  Patient: Maria [name]  Procedure(s) Performed: Procedure(s) (LRB): LAPAROSCOPIC GASTRIC SLEEVE RESECTION WITH HIATAL HERNIA REPAIR, UPPER ENDOSCOPY (N/A)  Patient location during evaluation: PACU Anesthesia Type: General Level of consciousness: awake and alert and oriented Pain management: pain level controlled Vital Signs Assessment: post-procedure vital signs reviewed and stable Respiratory status: spontaneous breathing, nonlabored ventilation, respiratory function stable and patient connected to nasal cannula oxygen Cardiovascular status: blood pressure returned to baseline and stable Postop Assessment: no signs of nausea or vomiting Anesthetic complications: no       Last Vitals:  Vitals:   07/26/16 1400 07/26/16 1415  BP: (!) 148/71   Pulse: 61 60  Resp: (!) 21   Temp:      Last Pain:  Vitals:   07/26/16 1415  TempSrc:   PainSc: 8                  Rodman Recupero A.

## 2016-07-26 NOTE — Discharge Instructions (Signed)

## 2016-07-26 NOTE — Progress Notes (Signed)
patient states she has been on nasal bactraban since 07/22/16 and will continue with home supply so I did not reorder this med. Placed on isolation

## 2016-07-26 NOTE — Anesthesia Preprocedure Evaluation (Addendum)
Anesthesia Evaluation  Patient identified by MRN, date of birth, ID band Patient awake    Reviewed: Allergy & Precautions, H&P , NPO status , Patient's Chart, lab work & pertinent test results  Airway Mallampati: II   Neck ROM: full    Dental   Pulmonary sleep apnea ,    breath sounds clear to auscultation       Cardiovascular hypertension, + dysrhythmias  Rhythm:regular Rate:Normal     Neuro/Psych PSYCHIATRIC DISORDERS Anxiety Depression  Neuromuscular disease    GI/Hepatic hiatal hernia, GERD  ,  Endo/Other  diabetes, Type 2Morbid obesity  Renal/GU stones     Musculoskeletal  (+) Arthritis ,   Abdominal   Peds  Hematology   Anesthesia Other Findings   Reproductive/Obstetrics                             Anesthesia Physical Anesthesia Plan  ASA: III  Anesthesia Plan: General   Post-op Pain Management:    Induction: Intravenous  Airway Management Planned: Oral ETT  Additional Equipment:   Intra-op Plan:   Post-operative Plan: Extubation in OR  Informed Consent: I have reviewed the patients History and Physical, chart, labs and discussed the procedure including the risks, benefits and alternatives for the proposed anesthesia with the patient or authorized representative who has indicated his/her understanding and acceptance.     Plan Discussed with: CRNA, Anesthesiologist and Surgeon  Anesthesia Plan Comments:         Anesthesia Quick Evaluation

## 2016-07-26 NOTE — Anesthesia Procedure Notes (Signed)
Procedure Name: Intubation Date/Time: 07/26/2016 11:12 AM Performed by: Carleene Cooper A Pre-anesthesia Checklist: Patient identified, Emergency Drugs available, Suction available, Patient being monitored and Timeout performed Patient Re-evaluated:Patient Re-evaluated prior to inductionOxygen Delivery Method: Circle system utilized Preoxygenation: Pre-oxygenation with 100% oxygen Intubation Type: IV induction Ventilation: Mask ventilation without difficulty Laryngoscope Size: Glidescope and 4 Grade View: Grade II Tube type: Oral Tube size: 7.5 mm Number of attempts: 3 Airway Equipment and Method: Video-laryngoscopy and Stylet Placement Confirmation: ETT inserted through vocal cords under direct vision,  positive ETCO2 and breath sounds checked- equal and bilateral Secured at: 22 cm Tube secured with: Tape Dental Injury: Teeth and Oropharynx as per pre-operative assessment  Comments: DL x 2 by CRNA with a MAC 4. Easy mask. Head repositioned between attempts. Grade 2 view. - ETCO2. Easy mask. VSS. DL X 1 by Dr. Marcie Bal with a Glidescope #4 blade. Grade 2 view. ATOI. + ETCO2. BBS=. ATOI

## 2016-07-26 NOTE — Op Note (Signed)
Preoperative diagnosis: laparoscopic sleeve gastrectomy  Postoperative diagnosis: Same   Procedure: Upper endoscopy   Surgeon: Clovis Riley, M.D.  Anesthesia: Gen.   Indications for procedure: This patient was undergoing a laparoscopic sleeve gastrectomy.   Description of procedure: The endoscopy was placed in the mouth and into the oropharynx and under endoscopic vision it was advanced to the esophagogastric junction. The pouch was insufflated and no bleeding or bubbles were seen. The GEJ was identified at 45cm from the teeth. No bleeding or leaks were detected. The scope was withdrawn without difficulty.   Clovis Riley, M.D. General, Bariatric, & Minimally Invasive Surgery [facility]

## 2016-07-26 NOTE — Transfer of Care (Signed)
Immediate Anesthesia Transfer of Care Note  Patient: Maria [name]  Procedure(s) Performed: Procedure(s): LAPAROSCOPIC GASTRIC SLEEVE RESECTION WITH HIATAL HERNIA REPAIR, UPPER ENDOSCOPY (N/A)  Patient Location: PACU  Anesthesia Type:General  Level of Consciousness: awake, alert , oriented and patient cooperative  Airway & Oxygen Therapy: Patient Spontanous Breathing and Patient connected to face mask oxygen  Post-op Assessment: Report given to RN, Post -op Vital signs reviewed and stable and Patient moving all extremities  Post vital signs: Reviewed and stable  Last Vitals:  Vitals:   07/26/16 0830  BP: (!) 158/87  Pulse: 69  Resp: 20  Temp: 36.4 C    Last Pain:  Vitals:   07/26/16 0925  TempSrc:   PainSc: 6       Patients Stated Pain Goal: 4 (12/45/80 9983)  Complications: No apparent anesthesia complications

## 2016-07-26 NOTE — Progress Notes (Signed)
Inpatient Diabetes Program Recommendations  AACE/ADA: New Consensus Statement on Inpatient Glycemic Control (2015)  Target Ranges:  Prepandial:   less than 140 mg/dL      Peak postprandial:   less than 180 mg/dL (1-2 hours)      Critically ill patients:  140 - 180 mg/dL   Lab Results  Component Value Date   GLUCAP 284 (H) 07/26/2016   HGBA1C 10.1 (H) 07/22/2016    Review of Glycemic Control  Diabetes history: DM2 Outpatient Diabetes medications: Levemir 10 units bid, metformin 1000 mg bid, glipizide 5 mg bid Current orders for Inpatient glycemic control: Novolog 0-20 units Q4H.  Novolog 15 units given in PACU at 1420. Novolog 0-20 units Q4H to start at 1800.  Will follow.  Thank you. Lorenda Peck, RD, LDN, CDE Inpatient Diabetes Coordinator [phone]

## 2016-07-26 NOTE — H&P (Signed)
Maria [name] Location: [facility] Patient #: [id number] DOB: 1961-04-06 Divorced / Language: English / Race: Black or African American Female   History of Present Illness  The patient is a 56 year old female who presents for a bariatric surgery evaluation. MBSQIP recognized comorbidities: Patient has hypertension, diabetes mellitus and gastroesophageal reflux disease. She has had a lifelong problem with obesity. She has tried numerous diets including phentermine and Adipex with limited results. She's had bilateral knee replacements done concomitantly noted [place]. Prior to that time she had cardiac clearance which was fine. She's never had any problems with her heart. She does have underlying diabetes which has been difficult to control with her obesity. She is really motivated once to get this done. Her hips and knees are good she does have some issues with back pain.  She denies any history of DVT. She does not snore and didn't have any symptoms of obstructive sleep apnea. Her BMI is 63 with a weight of 406. 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 Lucia Gaskins and I invited her to see him but she wanted me to see her regarding sleeve gastrectomy.  With the aid of the poster I discussed sleeve gastrectomy in some detail including risks of leaks and bleeds. She is aware and wants to proceed. She has the endorsement of her endocrinologist.  UGI suggested very small hiatal hernia but no reflux was seen.  Questions answered and will proceed with sleeve gastrectomy and look at hiatus for possible repair.      Allergies  Propoxyphene N-APAP *ANALGESICS - OPIOID*   Medication History  Breo Ellipta (100-25MCG/INH Aero Pow Br Act, Inhalation) Active. Pantoprazole Sodium (40MG  Tablet DR, Oral) Active. Pioglitazone HCl (30MG  Tablet, Oral) Active. MetFORMIN HCl (1000MG  Tablet, Oral) Active. Metoprolol Succinate (200MG  Tablet ER, Oral) Active. Glucotrol (5MG   Tablet, Oral) Active. Benazepril HCl (20MG  Tablet, Oral) Active. OxyCODONE HCl (15MG  Tablet, Oral) Active. Xanax (1MG  Tablet, Oral) Active. Soma (350MG  Tablet, Oral) Active. Colcrys (0.6MG  Tablet, Oral) Active. HydroDiuril (25MG  Tablet, Oral) Active. Levemir (100UNIT/ML Solution, Subcutaneous) Active. Metoprolol Succinate ER (200MG  Tablet ER 24HR, Oral) Active. Zofran (4MG  Tablet, Oral) Active. Medications Reconciled  Vitals Weight: 406.2 lb Height: 67.25in Body Surface Area: 2.74 m Body Mass Index: 63.15 kg/m  Temp.: 98.58F(Oral)  Pulse: 87 (Regular)  BP: 144/98 (Sitting, Left Arm, Standard)   Physical Exam General Note: WDAAFNAD-BMI 63 HEENT unremarkable Neck supple Chest clear Heart SR without murmurs Abdomen obese with prior scars from abscesses Ext s/p bilateral knee replacements Neuro alert and oriented x 3     Assessment & Plan  DIABETES (E11.9) MORBID OBESITY WITH BMI OF 60.0-69.9, ADULT (E66.01)  Plan :  Sleeve gastrectomy  Rodman Key B. Hassell Done, MD, FACS

## 2016-07-27 ENCOUNTER — Encounter (HOSPITAL_COMMUNITY): Payer: Self-pay | Admitting: Surgery

## 2016-07-27 LAB — GLUCOSE, CAPILLARY
Glucose-Capillary: 263 mg/dL — ABNORMAL HIGH (ref 65–99)
Glucose-Capillary: 232 mg/dL — ABNORMAL HIGH (ref 65–99)
Glucose-Capillary: 193 mg/dL — ABNORMAL HIGH (ref 65–99)
Glucose-Capillary: 201 mg/dL — ABNORMAL HIGH (ref 65–99)

## 2016-07-27 LAB — CBC WITH DIFFERENTIAL/PLATELET
Basophils Absolute: 0 10*3/uL (ref 0.0–0.1)
Basophils Relative: 0 %
Eosinophils Absolute: 0 10*3/uL (ref 0.0–0.7)
Eosinophils Relative: 0 %
HCT: 35.8 % — ABNORMAL LOW (ref 36.0–46.0)
Hemoglobin: 11.4 g/dL — ABNORMAL LOW (ref 12.0–15.0)
Lymphocytes Relative: 18 %
Lymphs Abs: 1.4 10*3/uL (ref 0.7–4.0)
MCH: 28.4 pg (ref 26.0–34.0)
MCHC: 31.8 g/dL (ref 30.0–36.0)
MCV: 89.3 fL (ref 78.0–100.0)
Monocytes Absolute: 0.5 10*3/uL (ref 0.1–1.0)
Monocytes Relative: 6 %
Neutro Abs: 6.3 10*3/uL (ref 1.7–7.7)
Neutrophils Relative %: 76 %
Platelets: 280 10*3/uL (ref 150–400)
RBC: 4.01 MIL/uL (ref 3.87–5.11)
RDW: 15.6 % — ABNORMAL HIGH (ref 11.5–15.5)
WBC: 8.2 10*3/uL (ref 4.0–10.5)

## 2016-07-27 MED ORDER — ONDANSETRON 4 MG PO TBDP: 4.0000 mg | ORAL_TABLET | Freq: Three times a day (TID) | ORAL | Status: DC | PRN

## 2016-07-27 MED ORDER — OXYCODONE HCL 5 MG/5ML PO SOLN: 5.0000 mg | ORAL | 0 refills | Status: DC | PRN

## 2016-07-27 MED ORDER — PANTOPRAZOLE SODIUM 20 MG PO TBEC
20.0000 mg | DELAYED_RELEASE_TABLET | Freq: Every day | ORAL | Status: DC
  Administered 2016-07-27: 20 mg via ORAL
  Filled 2016-07-27: qty 1

## 2016-07-27 NOTE — Progress Notes (Signed)
Nutrition Education Note  Received consult for diet education per DROP protocol.   Pt is POD #1 sleeve gastrectomy with upper endo done yesterday at ~1600. Discussed 2 week post op diet with pt. Emphasized that liquids must be non carbonated, non caffeinated, and sugar free. Fluid goals discussed. Pt to follow up with outpatient bariatric RD for further diet progression after 2 weeks. Multivitamins and minerals also reviewed. Teach back method used, pt expressed understanding, expect good compliance. Pt likes Premier Protein and has consumed 2 ounces of this without issue today. She reports nausea with drinking yesterday but was informed to take small, slow sips; after this change pt has had no further nausea. 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 She had not purchased multivitamin, mineral, or vitamin supplements as she thought that insurance would cover these items. She was informed this would not be the case and she is now working on obtaining money in order to get these items once she leaves the hospital today.    Diet: First 2 Weeks  You will see the nutritionist about two (2) weeks after your surgery. The nutritionist will increase the types of foods you can eat if you are handling liquids well:  If you have severe vomiting or nausea and cannot handle clear liquids lasting longer than 1 day, call your surgeon  Protein Shake  Drink at least 2 ounces of shake 5-6 times per day  Each serving of protein shakes (usually 8 - 12 ounces) should have a minimum of:  15 grams of protein  And no more than 5 grams of carbohydrate  Goal for protein each day:  Men = 80 grams per day  Women = 60 grams per day  Protein powder may be added to fluids such as non-fat milk or Lactaid milk or Soy milk (limit to 35 grams added protein powder per serving)   Hydration  Slowly increase the amount of water and other clear liquids as tolerated (See Acceptable Fluids)  Slowly increase the amount of protein  shake as tolerated  Sip fluids slowly and throughout the day  May use sugar substitutes in small amounts (no more than 6 - 8 packets per day; i.e. Splenda)   Fluid Goal  The first goal is to drink at least 8 ounces of protein shake/drink per day (or as directed by the nutritionist); some examples of protein shakes are Premier Protein, [employer], [employer]R [employer], EAS Edge HP, and Unjury. See handout from pre-op Bariatric Education Class:  Slowly increase the amount of protein shake you drink as tolerated  You may find it easier to slowly sip shakes throughout the day  It is important to get your proteins in first  Your fluid goal is to drink 64 - 100 ounces of fluid daily  It may take a few weeks to build up to this  32 oz (or more) should be clear liquids  And  32 oz (or more) should be full liquids (see below for examples)  Liquids should not contain sugar, caffeine, or carbonation   Clear Liquids:  Water or Sugar-free flavored water (i.e. Fruit H2O, Propel)  Decaffeinated coffee or tea (sugar-free)  Crystal Lite, Wyler's Lite, Minute Maid Lite  Sugar-free Jell-O  Bouillon or broth  Sugar-free Popsicle: *Less than 20 calories each; Limit 1 per day   Full Liquids:  Protein Shakes/Drinks + 2 choices per day of other full liquids  Full liquids must be:  No More Than 12 grams of Carbs per  serving  No More Than 3 grams of Fat per serving  Strained low-fat cream soup  Non-Fat milk  Fat-free Lactaid Milk  Sugar-free yogurt (Dannon Lite & Fit, [place] yogurt, Oikos Zero)     Jarome Matin, MS, RD, LDN, [facility] Inpatient Clinical Dietitian Pager # [phone] After hours/weekend pager # [phone]

## 2016-07-27 NOTE — Progress Notes (Signed)
I gave her a Rx for 200 ml of the 5mg /ml oxycodone.  She has Rx from earlier this month for 07/14/16 for oxycodone tablets.    I have personally reviewed the patients medication history on the [place] controlled substance database.

## 2016-07-27 NOTE — Progress Notes (Signed)
Patient ID: Maria [name], female   DOB: Nov 20, 1960, 56 y.o.   MRN: [id number] [facility] Progress Note:   1 Day Post-Op  Subjective: Mental status is clear.  No complaints. Objective: Vital signs in last 24 hours: Temp:  [97.5 F (36.4 C)-98.9 F (37.2 C)] 98 F (36.7 C) (04/18 0510) Pulse Rate:  [60-71] 62 (04/18 0510) Resp:  [11-21] 16 (04/18 0510) BP: (141-158)/(67-87) 142/68 (04/18 0510) SpO2:  [92 %-100 %] 100 % (04/18 0510) Weight:  [185 kg (407 lb 14.4 oz)] 185 kg (407 lb 14.4 oz) (04/17 1800)  Intake/Output from previous day: 04/17 0701 - 04/18 0700 In: 3680 [P.O.:560; I.V.:3120] Out: 925 [Urine:900; Blood:25] Intake/Output this shift: No intake/output data recorded.  Physical Exam: Work of breathing is normal.  Abdominal discomfort is appropriate  Lab Results:  Results for orders placed or performed during the hospital encounter of 07/26/16 (from the past 48 hour(s))  Glucose, capillary     Status: Abnormal   Collection Time: 07/26/16  8:21 AM  Result Value Ref Range   Glucose-Capillary 191 (H) 65 - 99 mg/dL  Glucose, capillary     Status: Abnormal   Collection Time: 07/26/16  1:43 PM  Result Value Ref Range   Glucose-Capillary 303 (H) 65 - 99 mg/dL   Comment 1 Call MD NNP PA CNM   CBC     Status: Abnormal   Collection Time: 07/26/16  3:43 PM  Result Value Ref Range   WBC 9.0 4.0 - 10.5 K/uL   RBC 4.29 3.87 - 5.11 MIL/uL   Hemoglobin 12.0 12.0 - 15.0 g/dL   HCT 37.8 36.0 - 46.0 %   MCV 88.1 78.0 - 100.0 fL   MCH 28.0 26.0 - 34.0 pg   MCHC 31.7 30.0 - 36.0 g/dL   RDW 15.6 (H) 11.5 - 15.5 %   Platelets 297 150 - 400 K/uL  Creatinine, serum     Status: Abnormal   Collection Time: 07/26/16  3:43 PM  Result Value Ref Range   Creatinine, Ser 1.27 (H) 0.44 - 1.00 mg/dL   GFR calc non Af Amer 46 (L) >60 mL/min   GFR calc Af Amer 53 (L) >60 mL/min    Comment: (NOTE) The eGFR has been calculated using the CKD EPI equation. This calculation has not  been validated in all clinical situations. eGFR's persistently <60 mL/min signify possible Chronic Kidney Disease.   Glucose, capillary     Status: Abnormal   Collection Time: 07/26/16  4:02 PM  Result Value Ref Range   Glucose-Capillary 284 (H) 65 - 99 mg/dL  Hemoglobin and hematocrit, blood     Status: None   Collection Time: 07/26/16  5:44 PM  Result Value Ref Range   Hemoglobin 12.6 12.0 - 15.0 g/dL   HCT 39.3 36.0 - 46.0 %  Glucose, capillary     Status: Abnormal   Collection Time: 07/26/16  6:20 PM  Result Value Ref Range   Glucose-Capillary 245 (H) 65 - 99 mg/dL  Glucose, capillary     Status: Abnormal   Collection Time: 07/26/16  7:44 PM  Result Value Ref Range   Glucose-Capillary 276 (H) 65 - 99 mg/dL  Glucose, capillary     Status: Abnormal   Collection Time: 07/26/16 10:11 PM  Result Value Ref Range   Glucose-Capillary 275 (H) 65 - 99 mg/dL  Glucose, capillary     Status: Abnormal   Collection Time: 07/27/16  1:10 AM  Result Value Ref Range  Glucose-Capillary 263 (H) 65 - 99 mg/dL  CBC WITH DIFFERENTIAL     Status: Abnormal   Collection Time: 07/27/16  5:11 AM  Result Value Ref Range   WBC 8.2 4.0 - 10.5 K/uL   RBC 4.01 3.87 - 5.11 MIL/uL   Hemoglobin 11.4 (L) 12.0 - 15.0 g/dL   HCT 35.8 (L) 36.0 - 46.0 %   MCV 89.3 78.0 - 100.0 fL   MCH 28.4 26.0 - 34.0 pg   MCHC 31.8 30.0 - 36.0 g/dL   RDW 15.6 (H) 11.5 - 15.5 %   Platelets 280 150 - 400 K/uL   Neutrophils Relative % 76 %   Neutro Abs 6.3 1.7 - 7.7 K/uL   Lymphocytes Relative 18 %   Lymphs Abs 1.4 0.7 - 4.0 K/uL   Monocytes Relative 6 %   Monocytes Absolute 0.5 0.1 - 1.0 K/uL   Eosinophils Relative 0 %   Eosinophils Absolute 0.0 0.0 - 0.7 K/uL   Basophils Relative 0 %   Basophils Absolute 0.0 0.0 - 0.1 K/uL  Glucose, capillary     Status: Abnormal   Collection Time: 07/27/16  5:53 AM  Result Value Ref Range   Glucose-Capillary 232 (H) 65 - 99 mg/dL    Radiology/Results: No results  found.  Anti-infectives: Anti-infectives    Start     Dose/Rate Route Frequency Ordered Stop   07/26/16 0856  cefoTEtan in Dextrose 5% (CEFOTAN) IVPB 2 g     2 g Intravenous On call to O.R. 07/26/16 0856 07/26/16 1113      Assessment/Plan: Problem List: Patient Active Problem List   Diagnosis Date Noted  . 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 Left ankle pain 07/22/2016  . Cervical disc disorder with radiculopathy of cervical region 02/05/2016  . Osteoarthritis of left hip 08/22/2014  . Cellulitis and abscess 05/31/2014  . Greater trochanteric bursitis of left hip 04/01/2014  . Radicular pain in left arm 09/27/2013  . Left shoulder pain 03/28/2013  . Rotator cuff syndrome 06/29/2011  . S/P TKR (total knee replacement) 01/17/2011  . Lateral epicondylitis 01/17/2011  . Preoperative clearance 07/15/2010  . Vaginal odor 06/01/2010  . Nasal congestion 06/01/2010  . Osteoarthrosis, unspecified whether generalized or localized, involving lower leg 12/11/2009  . GOUT, UNSPECIFIED 10/09/2009  . HEMORRHOIDS, EXTERNAL 06/17/2009  . Depression 04/23/2009  . NEPHROLITHIASIS, RECURRENT 04/23/2009  . CARPAL TUNNEL SYNDROME, LEFT 01/26/2009  . ROTATOR CUFF SYNDROME, LEFT 09/19/2008  . GERD 02/25/2008  . Recurrent UTI 05/02/2007  . Diabetes type 2, uncontrolled ([place]) 04/26/2006  . Hyperlipidemia 04/26/2006  . ANXIETY STATE NOS 04/26/2006  . Obstructive sleep apnea 04/26/2006  . MORBID OBESITY 01/11/2006  . Essential hypertension 01/11/2006  . ALLERGIC RHINITIS 01/11/2006  . DYSFUNCTIONAL UTERINE BLEEDING 01/11/2006    Doing well thus far.  Will need Rx at time of discharge.   1 Day Post-Op    LOS: 1 day   Matt B. Hassell Done, MD, [facility] [phone] beeper [phone]  07/27/2016 8:03 AM

## 2016-07-27 NOTE — Discharge Summary (Signed)
Physician Discharge Summary  Patient ID: Maria [name] MRN: [id number] DOB/AGE: [birth year] 56 y.o.  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 rate 16, height 5\' 9"  (1.753 m), weight (!) 185 kg (407 lb 14.4 oz), last menstrual period 04/11/2009, SpO2 96 %. Incisions OK  Disposition: 01-Home or Self Care  Discharge Instructions    Ambulate hourly while awake    Complete by:  As directed    Call MD for:  difficulty breathing, headache or visual disturbances    Complete by:  As directed    Call MD for:  persistant dizziness or light-headedness    Complete by:  As directed    Call MD for:  persistant nausea and vomiting    Complete by:  As directed    Call MD for:  redness, tenderness, or signs of infection (pain, swelling, redness, odor or green/yellow discharge around incision site)    Complete by:  As directed    Call MD for:  severe uncontrolled pain    Complete by:  As directed    Call MD for:  temperature >101 F    Complete by:  As directed    Diet bariatric full liquid    Complete by:  As directed    Incentive spirometry    Complete by:  As directed    Perform hourly while awake     Allergies as of 07/27/2016      Reactions   Propoxyphene N-acetaminophen Palpitations      Medication List    TAKE these medications   ALPRAZolam 1 MG tablet Commonly known as:  XANAX Take 1 mg by mouth 3 (three) times daily as needed for anxiety.   benazepril 20 MG  tablet Commonly known as:  LOTENSIN Take 20 mg by mouth daily at 2 PM. Notes to patient:  Monitor Blood Pressure Daily and keep a log for primary care physician.  You may need to make changes to your medications with rapid weight loss.     colchicine 0.6 MG tablet TAKE 1 TABLET BY MOUTH DAILY   diazepam 5 MG tablet Commonly known as:  VALIUM Take 1 tablet an hour before your procedure   furosemide 20 MG tablet Commonly known as:  LASIX Take 20 mg by mouth daily at 2 PM. Notes to patient:  Monitor Blood Pressure Daily and keep a log for primary care physician.  Monitor for symptoms of dehydration.  You may need to make changes to your medications with rapid weight loss.     glipiZIDE 5 MG tablet Commonly known as:  GLUCOTROL Take 5 mg by mouth 2 (two) times daily before a meal. 1400 & bedtime Notes to patient:  Monitor Blood Sugar Frequently and keep a log for primary care physician, you may need to adjust medication dosage with rapid weight loss.     ibuprofen 800 MG tablet Commonly known as:  ADVIL,MOTRIN Take 800 mg by mouth every 8 (eight) hours as needed for pain. Notes to patient:  Avoid NSAIDs for 6-8 weeks after surgery  LEVEMIR 100 UNIT/ML injection Generic drug:  insulin detemir Inject 10 Units into the skin 2 (two) times daily. Notes to patient:  Monitor Blood Sugar Frequently and keep a log for primary care physician, you may need to adjust medication dosage with rapid weight loss.     metFORMIN 1000 MG tablet Commonly known as:  GLUCOPHAGE Take 1,000 mg by mouth 2 (two) times daily with a meal. Notes to patient:  Monitor Blood Sugar Frequently and keep a log for primary care physician, you may need to adjust medication dosage with rapid weight loss.     metoprolol 200 MG 24 hr tablet Commonly known as:  TOPROL-XL Take 200 mg by mouth daily at 2 PM. Notes to patient:  Monitor Blood Pressure Daily and keep a log for primary care physician.  You may need to make  changes to your medications with rapid weight loss.     metroNIDAZOLE 0.75 % vaginal gel Commonly known as:  METROGEL Place 1 Applicatorful vaginally at bedtime. Apply one applicatorful to vagina at bedtime for 5 days   metroNIDAZOLE 500 MG tablet Commonly known as:  FLAGYL Take 1 tablet (500 mg total) by mouth 2 (two) times daily.   oxyCODONE 15 MG immediate release tablet Commonly known as:  ROXICODONE Take 15 mg by mouth every 6 (six) hours as needed for pain. What changed:  Another medication with the same name was added. Make sure you understand how and when to take each.   oxyCODONE 5 MG/5ML solution Commonly known as:  ROXICODONE Take 5-10 mLs (5-10 mg total) by mouth every 4 (four) hours as needed for moderate pain or severe pain. What changed:  You were already taking a medication with the same name, and this prescription was added. Make sure you understand how and when to take each.   pantoprazole 40 MG tablet Commonly known as:  PROTONIX Take 40 mg by mouth daily at 2 PM.   pioglitazone 30 MG tablet Commonly known as:  ACTOS Take 30 mg by mouth daily at 2 PM. Notes to patient:  Monitor Blood Sugar Frequently and keep a log for primary care physician, you may need to adjust medication dosage with rapid weight loss.     PROAIR HFA 108 (90 Base) MCG/ACT inhaler Generic drug:  albuterol Inhale 1-2 puffs into the lungs every 6 (six) hours as needed for shortness of breath or wheezing.   promethazine 25 MG tablet Commonly known as:  PHENERGAN Take 25 mg by mouth every 6 (six) hours as needed for nausea or vomiting.   SPIRIVA RESPIMAT 1.25 MCG/ACT Aers Generic drug:  Tiotropium Bromide Monohydrate Inhale 2 puffs into the lungs daily.      Follow-up Information    [name], MD. Go on 08/18/2016.   Specialty:  General Surgery Why:  at [address] information: [address] [place] [address] [phone]        Shalene Gallen B, MD Follow  up.   Specialty:  General Surgery Contact information: [address] [place] [address] [phone]           Signed: [name] 07/27/2016, 4:22 PM

## 2016-07-28 ENCOUNTER — Telehealth: Payer: Self-pay | Admitting: Skilled Nursing Facility1

## 2016-07-28 NOTE — Telephone Encounter (Signed)
Pt called inquiring about the appropriateness of a liquid multivitamin and a liquid calcium with vitamin d.  Dietitian explained to the pt the liquid vitamin d/calcium is acceptable but the liquid multivitamin would not meet her needs.

## 2016-07-29 ENCOUNTER — Telehealth: Payer: Self-pay | Admitting: Registered"

## 2016-07-29 NOTE — Telephone Encounter (Signed)
Patient called with concerns about not having access to multivitamins and supplements. Pt states she had surgery on Tues 4/17, doing well but unable to purchase vitamins and supplements until possibly Wed 4/25. Left supply of vitamins & supplements with instructions in front office for pt to pick up. Enough supply to last until Mon 4/23.

## 2016-08-01 ENCOUNTER — Telehealth: Payer: Self-pay | Admitting: *Deleted

## 2016-08-01 MED ORDER — COLCHICINE 0.6 MG PO TABS: 0.6000 mg | ORAL_TABLET | Freq: Every day | ORAL | 3 refills | Status: DC

## 2016-08-01 NOTE — Telephone Encounter (Signed)
Cr 1.27

## 2016-08-02 ENCOUNTER — Telehealth: Payer: Self-pay | Admitting: Skilled Nursing Facility1

## 2016-08-02 ENCOUNTER — Telehealth: Payer: Self-pay | Admitting: *Deleted

## 2016-08-02 NOTE — Telephone Encounter (Signed)
Pt called stating she cannot afford to buy the multivitamins. Pt states she will have money tomorrow to buy her bariatric specific multivitamin.   Pt called needing instruction on how to use her meter and strips to check her blood sugar. Dietitian offered direction over the phone. Pt was able to check her blood sugar after this instruction and got 147 for her number.

## 2016-08-03 ENCOUNTER — Telehealth: Payer: Self-pay | Admitting: Skilled Nursing Facility1

## 2016-08-03 NOTE — Telephone Encounter (Signed)
Pt called stating she got the Celebrate Multivitamin and asked how many to take. Dietitian advised she read the bottle for the appropriate dosage.

## 2016-08-03 NOTE — Telephone Encounter (Signed)
[place] That kind opf thing needs to come from her PCP [facility]! Maria [name]

## 2016-08-04 ENCOUNTER — Telehealth (HOSPITAL_COMMUNITY): Payer: Self-pay

## 2016-08-04 NOTE — Telephone Encounter (Signed)
Made discharge phone call to patient.  Asking the following questions.    1. Do you have someone to care for you now that you are home?  independent 2. Are you having pain now that is not relieved by your pain medication?  No pain meds  3. Are you able to drink the recommended daily amount of fluids (48 ounces minimum/day) and protein (60-80 grams/day) as prescribed by the dietitian or nutritional counselor?  30 grams of protein 40 ounces of water 4. Are you taking the vitamins and minerals as prescribed?  Just started taking them yesterday 5. Do you have the "on call" number to contact your surgeon if you have a problem or question?  yes 6. Are your incisions free of redness, swelling or drainage? (If steri strips, address that these can fall off, shower as tolerated) look good 7. Have your bowels moved since your surgery?  If not, are you passing gas?  yes 8. Are you up and walking 3-4 times per day?  yes 9. Were you provided your discharge medications before your surgery or before you were discharged from the hospital and are you taking them without problem?  Had meds no problems with meds

## 2016-08-09 ENCOUNTER — Ambulatory Visit: Payer: Self-pay | Admitting: Skilled Nursing Facility1

## 2016-08-11 ENCOUNTER — Encounter: Payer: Self-pay | Admitting: Skilled Nursing Facility1

## 2016-08-11 ENCOUNTER — Encounter: Payer: PPO | Attending: Surgery | Admitting: Skilled Nursing Facility1

## 2016-08-11 DIAGNOSIS — Z713 Dietary counseling and surveillance: Secondary | ICD-10-CM | POA: Diagnosis not present

## 2016-08-11 DIAGNOSIS — Z7984 Long term (current) use of oral hypoglycemic drugs: Secondary | ICD-10-CM | POA: Diagnosis not present

## 2016-08-11 DIAGNOSIS — Z79899 Other long term (current) drug therapy: Secondary | ICD-10-CM | POA: Insufficient documentation

## 2016-08-11 DIAGNOSIS — E119 Type 2 diabetes mellitus without complications: Secondary | ICD-10-CM | POA: Insufficient documentation

## 2016-08-11 NOTE — Progress Notes (Signed)
Bariatric Class:  Appt start time: 1530 end time:  1630.  2 Week Post-Operative Nutrition Class  Patient was seen on 08/11/2016 for Post-[facility]ative [facility]    Pt arrived dehydrated only getting in 30 ounces a day citing Bad headaches and dry mouth. Pt states her glucoses have been 121,143,151,179-off diabetes medicine and only taking one blood pressure medicine  Pt states she has not had an appetite and forcing herself to drink protein shakes.  Pt states All she had today was 1 bottle of water and 1 egg. Pt asked about ice cream and alcohol and was educated on these. Pt states the liquid calcium burnt her throat. Pt states she has been Walking down steps and up ramp. Goals: Aim for at least one refill of your water bottle  Surgery date: 07/26/16 Surgery type: Sleeve Gastrectomy  [facility] weight at [facility]: 417 Weight today: 379.2 Wt change: 34.5  TANITA  BODY COMP RESULTS  08/11/2016   BMI (kg/m^2) 56   Fat Mass (lbs) 207   Fat Free Mass (lbs) 172.2   Total Body Water (lbs) 129.8   The following the learning objectives were met by the patient during this course:  Identifies Phase 3A (Soft, High Proteins) Dietary Goals and will begin from 2 weeks post-operatively to 2 months post-operatively  Identifies appropriate sources of fluids and proteins   States protein recommendations and appropriate sources post-operatively  Identifies the need for appropriate texture modifications, mastication, and bite sizes when consuming solids  Identifies appropriate multivitamin and calcium sources post-operatively  Describes the need for physical activity post-operatively and will follow MD recommendations  States when to call healthcare provider regarding medication questions or post-operative complications  Handouts given during class include:  Phase 3A: Soft, High Protein Diet Handout  Follow-Up Plan: Patient will follow-up at [facility]  in 6 weeks for 2 month post-op nutrition visit for diet advancement per MD.

## 2016-08-12 ENCOUNTER — Encounter (HOSPITAL_COMMUNITY): Payer: Self-pay | Admitting: Emergency Medicine

## 2016-08-12 ENCOUNTER — Emergency Department (HOSPITAL_COMMUNITY)
Admission: EM | Admit: 2016-08-12 | Discharge: 2016-08-13 | Disposition: A | Discharge status: Home / Self Care | Payer: PPO | Attending: Emergency Medicine | Admitting: Emergency Medicine

## 2016-08-12 DIAGNOSIS — E119 Type 2 diabetes mellitus without complications: Secondary | ICD-10-CM | POA: Insufficient documentation

## 2016-08-12 DIAGNOSIS — M79661 Pain in right lower leg: Secondary | ICD-10-CM | POA: Diagnosis not present

## 2016-08-12 DIAGNOSIS — I10 Essential (primary) hypertension: Secondary | ICD-10-CM | POA: Diagnosis not present

## 2016-08-12 DIAGNOSIS — Z79899 Other long term (current) drug therapy: Secondary | ICD-10-CM | POA: Insufficient documentation

## 2016-08-12 DIAGNOSIS — M79604 Pain in right leg: Secondary | ICD-10-CM

## 2016-08-12 DIAGNOSIS — Z7984 Long term (current) use of oral hypoglycemic drugs: Secondary | ICD-10-CM | POA: Diagnosis not present

## 2016-08-12 DIAGNOSIS — R109 Unspecified abdominal pain: Secondary | ICD-10-CM | POA: Diagnosis not present

## 2016-08-12 DIAGNOSIS — Z96653 Presence of artificial knee joint, bilateral: Secondary | ICD-10-CM | POA: Insufficient documentation

## 2016-08-12 NOTE — ED Provider Notes (Signed)
[facility] DEPT Provider Note   CSN: [id number] Arrival date & time: 08/12/16  1932  By signing my name below, I, Maria [name], attest that this documentation has been prepared under the direction and in the presence of Montine Circle, PA-C.  Electronically Signed: Lise Auer, ED Scribe. 08/13/16. 12:57 AM.  History   Chief Complaint Chief Complaint  Patient presents with  . Leg Pain  . Flank Pain    The history is provided by the patient. No language interpreter was used.  Flank Pain     HPI Comments: Maria [name] is a 56 y.o. female with a PMHx of DM, GERD, HTN, and nephrolithiasis, who presents to the Emergency Department complaining of gradually worsening, right lower leg pain. She also reports right sided flank pain, decrease in her intake, decrease urine output, and a headache which started three days ago.  She states that she has not been able to drink as much because of recent gastric sleeve. She notes having swelling and pain to her right calf area. Pt states she had gastric sleeve surgery two weeks ago, her follow up is scheduled for 08/18/16. She states she had this "nagging" right flank prior to her surgery and has had multiple prior kidney stones. No treatment tried PTA.  PSHx of cholecystectomy. Denies hematuria or any other acute associated symptoms.   Past Medical History:  Diagnosis Date  . Allergic rhinitis   . Anxiety   . Carpal tunnel syndrome, left    pt denies  . Depression   . Diabetes mellitus   . DUB (dysfunctional uterine bleeding)   . Dysrhythmia    tachycardic  . External hemorrhoid   . GERD (gastroesophageal reflux disease)   . Gout   . History of hiatal hernia   . History of kidney stones   . Hypertension   . Morbid obesity ([place])   . Nephrolithiasis    recurrent  . Obesity   . Osteoarthritis, knee   . Pneumonia   . Recurrent UTI   . Rotator cuff syndrome of left shoulder     Patient Active Problem List   Diagnosis Date Noted  .  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 Left ankle pain 07/22/2016  . Cervical disc disorder with radiculopathy of cervical region 02/05/2016  . Osteoarthritis of left hip 08/22/2014  . Cellulitis and abscess 05/31/2014  . Greater trochanteric bursitis of left hip 04/01/2014  . Radicular pain in left arm 09/27/2013  . Left shoulder pain 03/28/2013  . Rotator cuff syndrome 06/29/2011  . S/P TKR (total knee replacement) 01/17/2011  . Lateral epicondylitis 01/17/2011  . Preoperative clearance 07/15/2010  . Vaginal odor 06/01/2010  . Nasal congestion 06/01/2010  . Osteoarthrosis, unspecified whether generalized or localized, involving lower leg 12/11/2009  . GOUT, UNSPECIFIED 10/09/2009  . HEMORRHOIDS, EXTERNAL 06/17/2009  . Depression 04/23/2009  . NEPHROLITHIASIS, RECURRENT 04/23/2009  . CARPAL TUNNEL SYNDROME, LEFT 01/26/2009  . ROTATOR CUFF SYNDROME, LEFT 09/19/2008  . GERD 02/25/2008  . Recurrent UTI 05/02/2007  . Diabetes type 2, uncontrolled ([place]) 04/26/2006  . Hyperlipidemia 04/26/2006  . ANXIETY STATE NOS 04/26/2006  . Obstructive sleep apnea 04/26/2006  . MORBID OBESITY 01/11/2006  . Essential hypertension 01/11/2006  . ALLERGIC RHINITIS 01/11/2006  . DYSFUNCTIONAL UTERINE BLEEDING 01/11/2006    Past Surgical History:  Procedure Laterality Date  . CHOLECYSTECTOMY    . INCISION AND DRAINAGE ABSCESS N/A 06/01/2014   Procedure: INCISION AND DRAINAGE ABSCESS;  Surgeon: Rolm Bookbinder, MD;  Location: [facility]  OR;  Service: General;  Laterality: N/A;  . IR GENERIC HISTORICAL  05/18/2016   IR EPIDUROGRAPHY 05/18/2016 [place], MD [facility]-INTERV RAD  . JOINT REPLACEMENT    . LAPAROSCOPIC GASTRIC SLEEVE RESECTION WITH HIATAL HERNIA REPAIR N/A 07/26/2016   Procedure: LAPAROSCOPIC GASTRIC SLEEVE RESECTION WITH HIATAL HERNIA REPAIR, UPPER ENDOSCOPY;  Surgeon: Johnathan Hausen, MD;  Location: [facility] ORS;  Service: General;  Laterality: N/A;  . LITHOTRIPSY    .  REPLACEMENT TOTAL KNEE BILATERAL    . TUBAL LIGATION      OB History    Gravida Para Term Preterm AB Living   4         3   SAB TAB Ectopic Multiple Live Births                  Obstetric Comments   Patient had two twin pregnancies.  One Pregnancy resulted in miscarriage completely and second twin pregnancy resulted in one live birth.  She also had a son that was killed in the [employer].       Home Medications    Prior to Admission medications   Medication Sig Start Date End Date Taking? Authorizing Provider  ALPRAZolam Duanne Moron) 1 MG tablet Take 1 mg by mouth 3 (three) times daily as needed for anxiety.  02/02/16   [provider]  benazepril (LOTENSIN) 20 MG tablet Take 20 mg by mouth daily at 2 PM. 07/06/16   [provider]  colchicine 0.6 MG tablet Take 1 tablet (0.6 mg total) by mouth daily. 08/01/16   Dickie La, MD  diazepam (VALIUM) 5 MG tablet Take 1 tablet an hour before your procedure Patient not taking: Reported on 07/19/2016 05/13/16   Dickie La, MD  furosemide (LASIX) 20 MG tablet Take 20 mg by mouth daily at 2 PM.  12/28/15   [provider]  glipiZIDE (GLUCOTROL) 5 MG tablet Take 5 mg by mouth 2 (two) times daily before a meal. 1400 & bedtime    [provider]  ibuprofen (ADVIL,MOTRIN) 800 MG tablet Take 800 mg by mouth every 8 (eight) hours as needed for pain. 02/02/16   [provider]  LEVEMIR 100 UNIT/ML injection Inject 10 Units into the skin 2 (two) times daily.  12/08/15   [provider]  metFORMIN (GLUCOPHAGE) 1000 MG tablet Take 1,000 mg by mouth 2 (two) times daily with a meal.     [provider]  metoprolol (TOPROL-XL) 200 MG 24 hr tablet Take 200 mg by mouth daily at 2 PM.     [provider]  metroNIDAZOLE (FLAGYL) 500 MG tablet Take 1 tablet (500 mg total) by mouth 2 (two) times daily. 07/19/16   Emily Filbert, MD  metroNIDAZOLE (METROGEL) 0.75 % vaginal gel Place 1 Applicatorful  vaginally at bedtime. Apply one applicatorful to vagina at bedtime for 5 days Patient not taking: Reported on 02/17/2016 02/03/16   Emily Filbert, MD  oxyCODONE (ROXICODONE) 5 MG/5ML solution Take 5-10 mLs (5-10 mg total) by mouth every 4 (four) hours as needed for moderate pain or severe pain. 07/27/16   Earnstine Regal, PA-C  pantoprazole (PROTONIX) 40 MG tablet Take 40 mg by mouth daily at 2 PM.  12/04/15   [provider]  pioglitazone (ACTOS) 30 MG tablet Take 30 mg by mouth daily at 2 PM.  09/08/15   [provider]  PROAIR HFA 108 (90 Base) MCG/ACT inhaler Inhale 1-2 puffs into the lungs every 6 (six) hours  as needed for shortness of breath or wheezing. 07/11/16   [provider]  promethazine (PHENERGAN) 25 MG tablet Take 25 mg by mouth every 6 (six) hours as needed for nausea or vomiting.    [provider]  SPIRIVA RESPIMAT 1.25 MCG/ACT AERS Inhale 2 puffs into the lungs daily. 07/06/16   [provider]    Family History Family History  Problem Relation Age of Onset  . Coronary artery disease    . Hypertension    . Diabetes    . Obesity    . Diabetes Mother   . Hypertension Mother     Social History Social History  Substance Use Topics  . Smoking status: Never Smoker  . Smokeless tobacco: Never Used  . Alcohol use No     Allergies   Propoxyphene n-acetaminophen   Review of Systems Review of Systems  Genitourinary: Positive for decreased urine volume and flank pain.    Physical Exam Updated Vital Signs BP (!) 131/97 (BP Location: Left Wrist)   Pulse 92   Temp 98.1 F (36.7 C) (Oral)   Resp 20   LMP 04/11/2009   SpO2 100%   Physical Exam  Constitutional: She is oriented to person, place, and time. She appears well-developed and well-nourished.  HENT:  Head: Normocephalic and atraumatic.  Cardiovascular: Normal rate, regular rhythm and intact distal pulses.   No murmur heard. Pulmonary/Chest: Effort normal and  breath sounds normal. No respiratory distress.  Abdominal: Soft. There is no tenderness. There is no rebound and no guarding.  No focal abdominal tenderness.   Musculoskeletal: She exhibits no edema or tenderness.  Right lower extremitiy remarkable for mild right calf tenderness withoit errthyema or evidence of abscess.   Neurological: She is alert and oriented to person, place, and time.  Skin: Skin is warm and dry.  Psychiatric: She has a normal mood and affect. Her behavior is normal.  Nursing note and vitals reviewed.  ED Treatments / Results   DIAGNOSTIC STUDIES: Oxygen Saturation is 100% on RA, normal by my interpretation.   COORDINATION OF CARE: 12:01 AM-Discussed next steps with pt. Pt verbalized understanding and is agreeable with the plan.   Labs (all labs ordered are listed, but only abnormal results are displayed) Labs Reviewed  URINALYSIS, ROUTINE W REFLEX MICROSCOPIC   EKG  EKG Interpretation None      Radiology No results found.  Procedures Procedures (including critical care time)  Medications Ordered in ED Medications - No data to display   Initial Impression / Assessment and Plan / ED Course  I have reviewed the triage vital signs and the nursing notes.  Pertinent labs & imaging results that were available during my care of the patient were reviewed by me and considered in my medical decision making (see chart for details).     Patient with right lower extremity pain. She is concerned about DVT given her recent surgery. Ultrasound is not available at this time, but in a few hours she will be able to get an ultrasound over at [facility]. I've discussed this with the patient, and she agrees and will follow up there at 8 AM. Laboratory workup is otherwise reassuring. Her potassium is somewhat low, but she denies any chest pain, shortness breath, or dizziness. I gave her some potassium in the emergency department, and will also give her some potassium to  go home with. Return cautions discussed. Patient understands agrees the plan. Her abdomen is soft and benign.  Final Clinical  Impressions(s) / ED Diagnoses   Final diagnoses:  Right leg pain  Right flank pain   New Prescriptions New Prescriptions   ONDANSETRON (ZOFRAN) 4 MG TABLET    Take 1 tablet (4 mg total) by mouth every 6 (six) hours.   POTASSIUM CHLORIDE (K-DUR) 10 MEQ TABLET    Take 1 tablet (10 mEq total) by mouth daily.   I personally performed the services described in this documentation, which was scribed in my presence. The recorded information has been reviewed and is accurate.       Montine Circle, PA-C 08/13/16 [place], MD 08/13/16 [phone]

## 2016-08-12 NOTE — ED Notes (Signed)
Pt unable to give urine sample, but aware that we need one.  

## 2016-08-12 NOTE — ED Triage Notes (Signed)
Patient reports surgery April 17 gastric sleeve. Onset of today pt having left calf tenderness and swelling. Pt also c/o right sided flank pain. Hx of kidney stones. Able to void.

## 2016-08-13 ENCOUNTER — Ambulatory Visit (HOSPITAL_COMMUNITY): Payer: PPO

## 2016-08-13 LAB — COMPREHENSIVE METABOLIC PANEL
ALT: 37 U/L (ref 14–54)
AST: 41 U/L (ref 15–41)
Albumin: 4.1 g/dL (ref 3.5–5.0)
Alkaline Phosphatase: 97 U/L (ref 38–126)
Anion gap: 13 (ref 5–15)
BUN: 12 mg/dL (ref 6–20)
CO2: 29 mmol/L (ref 22–32)
Calcium: 9.2 mg/dL (ref 8.9–10.3)
Chloride: 101 mmol/L (ref 101–111)
Creatinine, Ser: 1.21 mg/dL — ABNORMAL HIGH (ref 0.44–1.00)
GFR calc Af Amer: 56 mL/min — ABNORMAL LOW (ref >60)
GFR calc non Af Amer: 49 mL/min — ABNORMAL LOW (ref >60)
Glucose, Bld: 289 mg/dL — ABNORMAL HIGH (ref 65–99)
Potassium: 3 mmol/L — ABNORMAL LOW (ref 3.5–5.1)
Sodium: 143 mmol/L (ref 135–145)
Total Bilirubin: 0.9 mg/dL (ref 0.3–1.2)
Total Protein: 7.8 g/dL (ref 6.5–8.1)

## 2016-08-13 LAB — URINALYSIS, ROUTINE W REFLEX MICROSCOPIC
Glucose, UA: 50 mg/dL — AB
Hgb urine dipstick: NEGATIVE
Ketones, ur: 20 mg/dL — AB
Leukocytes, UA: NEGATIVE
Nitrite: NEGATIVE
Protein, ur: 100 mg/dL — AB
Specific Gravity, Urine: 1.02 (ref 1.005–1.030)
pH: 6 (ref 5.0–8.0)

## 2016-08-13 LAB — CBC
HCT: 45.1 % (ref 36.0–46.0)
Hemoglobin: 14.3 g/dL (ref 12.0–15.0)
MCH: 28.8 pg (ref 26.0–34.0)
MCHC: 31.7 g/dL (ref 30.0–36.0)
MCV: 90.7 fL (ref 78.0–100.0)
Platelets: 332 10*3/uL (ref 150–400)
RBC: 4.97 MIL/uL (ref 3.87–5.11)
RDW: 14.9 % (ref 11.5–15.5)
WBC: 6.6 10*3/uL (ref 4.0–10.5)

## 2016-08-13 LAB — LIPASE, BLOOD: Lipase: 35 U/L (ref 11–51)

## 2016-08-13 MED ORDER — POTASSIUM CHLORIDE ER 10 MEQ PO TBCR: 10.0000 meq | EXTENDED_RELEASE_TABLET | Freq: Every day | ORAL | 0 refills | Status: DC

## 2016-08-13 MED ORDER — MORPHINE SULFATE (PF) 4 MG/ML IV SOLN
4.0000 mg | Freq: Once | INTRAVENOUS | Status: AC
  Administered 2016-08-13: 4 mg via INTRAVENOUS
  Filled 2016-08-13: qty 1

## 2016-08-13 MED ORDER — SODIUM CHLORIDE 0.9 % IV BOLUS (SEPSIS)
1000.0000 mL | Freq: Once | INTRAVENOUS | Status: AC
  Administered 2016-08-13: 1000 mL via INTRAVENOUS

## 2016-08-13 MED ORDER — ONDANSETRON HCL 4 MG/2ML IJ SOLN
4.0000 mg | Freq: Once | INTRAMUSCULAR | Status: AC
  Administered 2016-08-13: 4 mg via INTRAVENOUS
  Filled 2016-08-13: qty 2

## 2016-08-13 MED ORDER — ONDANSETRON HCL 4 MG PO TABS: 4.0000 mg | ORAL_TABLET | Freq: Four times a day (QID) | ORAL | 0 refills | Status: DC

## 2016-08-13 MED ORDER — POTASSIUM CHLORIDE CRYS ER 20 MEQ PO TBCR
40.0000 meq | EXTENDED_RELEASE_TABLET | Freq: Once | ORAL | Status: AC
  Administered 2016-08-13: 40 meq via ORAL
  Filled 2016-08-13: qty 2

## 2016-08-13 NOTE — Discharge Instructions (Signed)
Please follow-up as directed for an ultrasound of your right leg.  If you have fever, increasing pain, or worsening symptoms please return to the emergency department.

## 2016-08-15 ENCOUNTER — Ambulatory Visit (HOSPITAL_COMMUNITY): Admission: RE | Admit: 2016-08-15 | Payer: PPO | Source: Ambulatory Visit

## 2016-08-16 ENCOUNTER — Ambulatory Visit: Payer: Self-pay

## 2016-09-02 ENCOUNTER — Ambulatory Visit: Payer: PPO | Admitting: Family Medicine

## 2016-09-09 ENCOUNTER — Ambulatory Visit (INDEPENDENT_AMBULATORY_CARE_PROVIDER_SITE_OTHER): Payer: PPO | Admitting: Family Medicine

## 2016-09-09 ENCOUNTER — Encounter: Payer: Self-pay | Admitting: Family Medicine

## 2016-09-09 DIAGNOSIS — M719 Bursopathy, unspecified: Secondary | ICD-10-CM | POA: Diagnosis not present

## 2016-09-09 DIAGNOSIS — M67919 Unspecified disorder of synovium and tendon, unspecified shoulder: Secondary | ICD-10-CM

## 2016-09-09 MED ORDER — METHYLPREDNISOLONE ACETATE 40 MG/ML IJ SUSP
40.0000 mg | Freq: Once | INTRAMUSCULAR | Status: AC
  Administered 2016-09-09: 40 mg via INTRA_ARTICULAR

## 2016-09-09 NOTE — Progress Notes (Signed)
    CHIEF COMPLAINT / HPI:   Left shoulder pain for the last 4 weeks. Has had similar problems in the remote past. She's been doing more exercise both intentional and unintentional. She recently had gastric sleeve surgery and has been using her shoulders and arms to help get out of bed during her recovery. She's quite excited about the results having lost 54 pounds in the last 4-6 weeks. She's had no problems post surgical is of the shoulder pain.  Pain is diffuse in the left shoulder sometimes radiating to the posterior part. Aching in nature 4-6 out of 10. Worse with activity particular working her reaching overhead  REVIEW OF SYSTEMS:  She's noted no unusual warmth of the joint. Noted no swelling. She's had no fever. No numbness or tingling in her hands  OBJECTIVE:  Vital signs are reviewed.   GEN.: Well-developed obese female no acute distress SHOULDERS: Bilaterally symmetrical. The shoulder has full range of motion all planes the rotator cuff with intact strength. She has significant pain with supraspinatus testing. The acromioclavicular joint is nontender to palpation. The bicep tendon is nontender to palpation. She has intact strength bicep and tricep. SKIN: There is no lesion, no unusual warmth, no rash noted in the area of the left shoulder. Neck sock Stressor: Radial pulses 2+ bilaterally symmetrical  INJECTION: Patient was given informed consent, signed copy in the chart. Appropriate time out was taken. Area prepped and draped in usual sterile fashion. 1 cc of methylprednisolone 40 mg/ml plus  4 cc of 1% lidocaine without epinephrine was injected into the left subacromial bursa using a(n) posterior approach. The patient tolerated the procedure well. There were no complications. Post procedure instructions were given.   ASSESSMENT / PLAN: Left subacromial bursitis probably related to increased work status post recent surgical procedure. Corticosteroid injection today. Home exercise  program given in handout form explained. I have given her both a blue and a green therapy and in have given her instructions how to advance. She'll return when necessary or if not improving in the next 4 weeks. #2. Morbid obesity status post recent gastric sleeve or seizure. 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

## 2016-09-09 NOTE — Assessment & Plan Note (Signed)
Recurrence of symptoms. Corticosteroid injection today. Home exercise program.

## 2016-09-12 ENCOUNTER — Other Ambulatory Visit: Payer: Self-pay | Admitting: *Deleted

## 2016-09-12 DIAGNOSIS — B3731 Acute candidiasis of vulva and vagina: Secondary | ICD-10-CM

## 2016-09-12 DIAGNOSIS — B373 Candidiasis of vulva and vagina: Secondary | ICD-10-CM

## 2016-09-12 MED ORDER — FLUCONAZOLE 150 MG PO TABS: 150.0000 mg | ORAL_TABLET | Freq: Once | ORAL | 0 refills | Status: AC

## 2016-09-13 ENCOUNTER — Telehealth: Payer: Self-pay | Admitting: *Deleted

## 2016-09-15 NOTE — Telephone Encounter (Signed)
Pt informed and will contact the MD that performed the gastric surgery

## 2016-09-15 NOTE — Telephone Encounter (Signed)
[place] She would have to clear that with her medical doctor as she just had gastric sleeve surgery THANKS! Dorcas Mcmurray

## 2016-09-19 ENCOUNTER — Telehealth: Payer: Self-pay | Admitting: *Deleted

## 2016-09-19 ENCOUNTER — Ambulatory Visit: Payer: Self-pay | Admitting: Skilled Nursing Facility1

## 2016-09-19 DIAGNOSIS — B3731 Acute candidiasis of vulva and vagina: Secondary | ICD-10-CM

## 2016-09-19 DIAGNOSIS — B373 Candidiasis of vulva and vagina: Secondary | ICD-10-CM

## 2016-09-19 MED ORDER — FLUCONAZOLE 200 MG PO TABS: 200.0000 mg | ORAL_TABLET | Freq: Once | ORAL | 0 refills | Status: AC

## 2016-09-19 NOTE — Telephone Encounter (Signed)
-----   Message from Blanchie Dessert, [place] sent at 09/19/2016  1:35 PM EDT ----- Regarding: resend a differnet amount of Diflucan  Contact: [phone] Pt call in another Diflucan x 2 200mg  to [facility] in [place]. Only 1-150mg  was called in

## 2016-11-16 ENCOUNTER — Telehealth: Payer: Self-pay

## 2016-11-16 MED ORDER — METRONIDAZOLE 500 MG PO TABS: 500.0000 mg | ORAL_TABLET | Freq: Two times a day (BID) | ORAL | 0 refills | Status: DC

## 2016-11-16 NOTE — Telephone Encounter (Signed)
Patient called complaining of vaginal discharge and odor.  Per protocol flagyl sent to pharmacy.

## 2016-12-09 ENCOUNTER — Ambulatory Visit: Payer: PPO | Admitting: Family Medicine

## 2016-12-20 ENCOUNTER — Telehealth: Payer: Self-pay | Admitting: *Deleted

## 2016-12-20 ENCOUNTER — Ambulatory Visit: Payer: PPO | Admitting: Family Medicine

## 2016-12-20 DIAGNOSIS — B3731 Acute candidiasis of vulva and vagina: Secondary | ICD-10-CM

## 2016-12-20 DIAGNOSIS — B373 Candidiasis of vulva and vagina: Secondary | ICD-10-CM

## 2016-12-20 MED ORDER — FLUCONAZOLE 150 MG PO TABS: 150.0000 mg | ORAL_TABLET | ORAL | 3 refills | Status: DC

## 2016-12-20 NOTE — Telephone Encounter (Signed)
-----   Message from Blanchie Dessert, [place] sent at 12/20/2016 10:58 AM EDT ----- Regarding: 200 mg Diflucan  Contact: [phone] Please call in the 200 mg D[facility]lucan to [facility] [place], the other doseage was not enough. Still itching

## 2016-12-28 ENCOUNTER — Ambulatory Visit: Payer: Self-pay | Admitting: Obstetrics & Gynecology

## 2016-12-29 ENCOUNTER — Other Ambulatory Visit: Payer: Self-pay | Admitting: Family Medicine

## 2017-01-05 ENCOUNTER — Other Ambulatory Visit: Payer: Self-pay | Admitting: *Deleted

## 2017-01-05 MED ORDER — COLCHICINE 0.6 MG PO TABS: 0.6000 mg | ORAL_TABLET | Freq: Every day | ORAL | 0 refills | Status: DC

## 2017-01-17 ENCOUNTER — Ambulatory Visit (INDEPENDENT_AMBULATORY_CARE_PROVIDER_SITE_OTHER): Payer: PPO | Admitting: Family Medicine

## 2017-01-17 VITALS — BP 160/85 | Ht 68.0 in | Wt 260.0 lb

## 2017-01-17 DIAGNOSIS — M25522 Pain in left elbow: Secondary | ICD-10-CM

## 2017-01-17 MED ORDER — IBUPROFEN 800 MG PO TABS: 800.0000 mg | ORAL_TABLET | Freq: Three times a day (TID) | ORAL | 2 refills | Status: DC | PRN

## 2017-01-18 NOTE — Progress Notes (Signed)
.  nsm

## 2017-01-18 NOTE — Progress Notes (Signed)
  Maria [name] - 56 y.o. female MRN [id number]  Date of birth: 06-29-1960    SUBJECTIVE:      Chief Complaint:/ HPI:   Elbow pain on the left. Right-hand dominant. Has been bothering her for or 5 weeks.Marland Kitchen His most painful in the morning. Pain is sharp, located right in the posterior part of the elbow. Worse with extension of the arm. Note she does sleep with both arms flexed and frequently underneath her. She has not had any specific injury that she's aware of. Has not had this problem previously. #2. Left shoulder pain is somewhat better after the injection at last office visit.   ROS:     No unusual weight change. No fever. No numbness or tingling in her left hand. No swelling of the left elbow.  PERTINENT  PMH / PSH FH / / SH:  Past Medical, Surgical, Social, and Family History Reviewed & Updated in the EMR.  Pertinent findings include:  Gastric bypass surgery Bilateral total knee replacement Obesity History of cervical radiculopathy in the left arm.  OBJECTIVE: BP (!) 160/85   Ht 5\' 8"  (1.727 m)   Wt 260 lb (117.9 kg)   LMP 04/11/2009   BMI 39.53 kg/m   Physical Exam:  Vital signs are reviewed. GEN.: Well-developed overweight female no acute distress ELBOWS: Symmetrical. Left elbow is without any unusual swelling, no defect. Full range of motion flexion extension. Extension is associated with some mild pain as she moves through the last 10-15. Supination and pronation are painless and full range of motion. Palpation over the ulnar groove in the left elbow reproduces her pain. There is no nodularity noted over the area of the nerve. WRISTS: Bilaterally symmetrical. Flexion and extension strength and range of motion is normal. SKIN: No unusual erythema or warmth, no skin lesions left upper extremity VASCULAR: Radial pulses 2+ bilaterally symmetrical. Neuro: Intact sensation soft touch bilateral hands. She has normal grip strength bilaterally. No deficit in the ulnar distribution of  the left hand for motor or sensation. ASSESSMENT & PLAN:  #1. Elbow pain: I think this is related to stretch injury of her ulnar nerve at the elbow. I would recommend splinting the arm with a pillow or magazine at night so that she cannot sleep with it fully flexed. I suspect this position is causing some chronic stretch. If this does not improve in the next 4-8 weeks she will return for further evaluation. Should his symptoms worsen she'll return before that date.

## 2017-01-19 ENCOUNTER — Ambulatory Visit: Payer: Self-pay | Admitting: Obstetrics & Gynecology

## 2017-02-13 ENCOUNTER — Ambulatory Visit: Payer: Self-pay | Admitting: Obstetrics & Gynecology

## 2017-02-15 ENCOUNTER — Other Ambulatory Visit: Payer: Self-pay | Admitting: Family Medicine

## 2017-02-27 ENCOUNTER — Other Ambulatory Visit (HOSPITAL_COMMUNITY)
Admission: RE | Admit: 2017-02-27 | Discharge: 2017-02-27 | Disposition: A | Discharge status: Home / Self Care | Payer: PPO | Source: Ambulatory Visit | Attending: Obstetrics & Gynecology | Admitting: Obstetrics & Gynecology

## 2017-02-27 ENCOUNTER — Ambulatory Visit (INDEPENDENT_AMBULATORY_CARE_PROVIDER_SITE_OTHER): Payer: PPO | Admitting: Obstetrics & Gynecology

## 2017-02-27 ENCOUNTER — Encounter: Payer: Self-pay | Admitting: Obstetrics & Gynecology

## 2017-02-27 VITALS — BP 138/83 | HR 88 | Ht 68.0 in | Wt 343.0 lb

## 2017-02-27 DIAGNOSIS — Z01419 Encounter for gynecological examination (general) (routine) without abnormal findings: Secondary | ICD-10-CM | POA: Diagnosis not present

## 2017-02-27 MED ORDER — METRONIDAZOLE 500 MG PO TABS: 500.0000 mg | ORAL_TABLET | Freq: Two times a day (BID) | ORAL | 0 refills | Status: DC

## 2017-02-27 NOTE — Progress Notes (Signed)
Subjective:    Maria [name] is a 56 y.o. D AA P4 (57 yo twins and only 1 lived, 20 yo son, and also a deceased son)  who presents for an annual exam. The patient has no complaints today. She would like STI testing.The patient is sexually active. GYN screening history: last pap: was normal. The patient wears seatbelts: yes. The patient participates in regular exercise: yes. Has the patient ever been transfused or tattooed?: yes. The patient reports that there is not domestic violence in her life.   Menstrual History: OB History    Gravida Para Term Preterm AB Living   4         3   SAB TAB Ectopic Multiple Live Births                  Obstetric Comments   Patient had two twin pregnancies.  One Pregnancy resulted in miscarriage completely and second twin pregnancy resulted in one live birth.  She also had a son that was killed in the [employer].      Menarche age: 15 Patient's last menstrual period was 04/11/2009.    The following portions of the patient's history were reviewed and updated as appropriate: allergies, current medications, past family history, past medical history, past social history, past surgical history and problem list.  Review of Systems Pertinent items are noted in HPI.   Mammogram due FH- mat first cousin with breast cancer, no gyn or colon cancer On disabiltiy Monogamous for 2 years. Had her flu vaccine this season. No periods for 3+ years    Objective:    BP 138/83   Pulse 88   Ht 5\' 8"  (1.727 m)   Wt (!) 343 lb (155.6 kg)   LMP 04/11/2009   BMI 52.15 kg/m   General Appearance:    Alert, cooperative, no distress, appears stated age  Head:    Normocephalic, without obvious abnormality, atraumatic  Eyes:    PERRL, conjunctiva/corneas clear, EOM's intact, fundi    benign, both eyes  Ears:    Normal TM's and external ear canals, both ears  Nose:   Nares normal, septum midline, mucosa normal, no drainage    or sinus tenderness  Throat:   Lips, mucosa, and  tongue normal; teeth and gums normal  Neck:   Supple, symmetrical, trachea midline, no adenopathy;    thyroid:  no enlargement/tenderness/nodules; no carotid   bruit or JVD  Back:     Symmetric, no curvature, ROM normal, no CVA tenderness  Lungs:     Clear to auscultation bilaterally, respirations unlabored  Chest Wall:    No tenderness or deformity   Heart:    Regular rate and rhythm, S1 and S2 normal, no murmur, rub   or gallop  Breast Exam:    No tenderness, masses, or nipple abnormality  Abdomen:     Soft, non-tender, bowel sounds active all four quadrants,    no masses, no organomegaly  Genitalia:    Normal female without lesion, discharge or tenderness, discharge c/w BV, no palpable masses     Extremities:   Extremities normal, atraumatic, no cyanosis or edema  Pulses:   2+ and symmetric all extremities  Skin:   Skin color, texture, turgor normal, no rashes or lesions  Lymph nodes:   Cervical, supraclavicular, and axillary nodes normal  Neurologic:   CNII-XII intact, normal strength, sensation and reflexes    throughout  .    Assessment:    Healthy female exam.  Plan:     Thin prep Pap smear. with cotesting STI testing Flagyl prescribed

## 2017-02-28 LAB — HEPATITIS C ANTIBODY: Hep C Virus Ab: <0.1 s/co ratio (ref 0.0–0.9)

## 2017-02-28 LAB — HEPATITIS B SURFACE ANTIGEN: Hepatitis B Surface Ag: NEGATIVE

## 2017-02-28 LAB — HIV ANTIBODY (ROUTINE TESTING W REFLEX): HIV Screen 4th Generation wRfx: NONREACTIVE

## 2017-02-28 LAB — RPR: RPR Ser Ql: NONREACTIVE

## 2017-03-01 LAB — CYTOLOGY - PAP
Bacterial vaginitis: NEGATIVE
Candida vaginitis: NEGATIVE
Chlamydia: NEGATIVE
Diagnosis: NEGATIVE
HPV: NOT DETECTED
Neisseria Gonorrhea: NEGATIVE
Trichomonas: NEGATIVE

## 2017-03-07 ENCOUNTER — Telehealth: Payer: Self-pay

## 2017-03-07 NOTE — Telephone Encounter (Signed)
Called patient and informed of her negative test results.

## 2017-03-07 NOTE — Telephone Encounter (Signed)
-----   Message from Blanchie Dessert, [place] sent at 03/07/2017  1:47 PM EST ----- Regarding: test results  Please call patient with test results

## 2017-03-10 ENCOUNTER — Other Ambulatory Visit: Payer: Self-pay | Admitting: Family Medicine

## 2017-03-15 DIAGNOSIS — B309 Viral conjunctivitis, unspecified: Secondary | ICD-10-CM | POA: Diagnosis not present

## 2017-03-15 DIAGNOSIS — H1089 Other conjunctivitis: Secondary | ICD-10-CM | POA: Diagnosis not present

## 2017-03-15 DIAGNOSIS — J40 Bronchitis, not specified as acute or chronic: Secondary | ICD-10-CM | POA: Diagnosis not present

## 2017-03-15 DIAGNOSIS — R51 Headache: Secondary | ICD-10-CM | POA: Diagnosis not present

## 2017-03-15 DIAGNOSIS — I10 Essential (primary) hypertension: Secondary | ICD-10-CM | POA: Diagnosis not present

## 2017-03-15 DIAGNOSIS — R0602 Shortness of breath: Secondary | ICD-10-CM | POA: Diagnosis not present

## 2017-03-15 DIAGNOSIS — R05 Cough: Secondary | ICD-10-CM | POA: Diagnosis not present

## 2017-03-15 DIAGNOSIS — E119 Type 2 diabetes mellitus without complications: Secondary | ICD-10-CM | POA: Diagnosis not present

## 2017-03-29 DIAGNOSIS — Z9884 Bariatric surgery status: Secondary | ICD-10-CM | POA: Diagnosis not present

## 2017-03-29 DIAGNOSIS — R05 Cough: Secondary | ICD-10-CM | POA: Diagnosis not present

## 2017-03-31 ENCOUNTER — Ambulatory Visit: Payer: PPO | Admitting: Family Medicine

## 2017-04-12 ENCOUNTER — Telehealth: Payer: Self-pay

## 2017-04-12 ENCOUNTER — Other Ambulatory Visit: Payer: Self-pay

## 2017-04-12 MED ORDER — METRONIDAZOLE 500 MG PO TABS: 500.0000 mg | ORAL_TABLET | Freq: Two times a day (BID) | ORAL | 0 refills | Status: DC

## 2017-04-12 NOTE — Telephone Encounter (Signed)
Call patient in regards to her breast leaking. Advised patient she would need a appointment so that we can look at her breast. Patient also wanted a refill to help with BV since the first medication did not clear it up. I have advised patient we will call her in another refill but if it does not clear up she will need to be seen.

## 2017-04-12 NOTE — Telephone Encounter (Signed)
-----   Message from Blanchie Dessert, [place] sent at 04/12/2017 11:50 AM EST ----- Regarding: left breast is leaking Patient called stating that her breast is leaking, and would like a call back from someone. Explained that patient would need to be seen

## 2017-04-18 ENCOUNTER — Other Ambulatory Visit: Payer: Self-pay | Admitting: Obstetrics & Gynecology

## 2017-04-18 ENCOUNTER — Ambulatory Visit: Payer: PPO | Admitting: Family Medicine

## 2017-04-18 ENCOUNTER — Ambulatory Visit (INDEPENDENT_AMBULATORY_CARE_PROVIDER_SITE_OTHER): Payer: PPO | Admitting: Obstetrics & Gynecology

## 2017-04-18 ENCOUNTER — Encounter: Payer: Self-pay | Admitting: Obstetrics & Gynecology

## 2017-04-18 VITALS — BP 143/86 | HR 69 | Wt 345.4 lb

## 2017-04-18 DIAGNOSIS — N6452 Nipple discharge: Secondary | ICD-10-CM

## 2017-04-18 DIAGNOSIS — N643 Galactorrhea not associated with childbirth: Secondary | ICD-10-CM | POA: Diagnosis not present

## 2017-04-18 DIAGNOSIS — Z0000 Encounter for general adult medical examination without abnormal findings: Secondary | ICD-10-CM

## 2017-04-18 NOTE — Progress Notes (Signed)
Pt complains of having drainage from left nipple. States that the drainage is clear with slight tinge ob blood.

## 2017-04-18 NOTE — Progress Notes (Signed)
Patient ID: Maria [name], female   DOB: 07-27-1960, 57 y.o.   MRN: [id number]  Chief Complaint  Patient presents with  . Gynecologic Exam    HPI Maria [name] is a 57 y.o. female.  She is here with a left nipple discharge for about a week. It was clear until yesterday when it became bloody. She was seen here for an annual exam and a screening mammogram was ordered. This has not been done yet.   HPI  Past Medical History:  Diagnosis Date  . Allergic rhinitis   . Anxiety   . Carpal tunnel syndrome, left    pt denies  . Depression   . Diabetes mellitus   . DUB (dysfunctional uterine bleeding)   . Dysrhythmia    tachycardic  . External hemorrhoid   . GERD (gastroesophageal reflux disease)   . Gout   . History of hiatal hernia   . History of kidney stones   . Hypertension   . Morbid obesity ([place])   . Nephrolithiasis    recurrent  . Obesity   . Osteoarthritis, knee   . Pneumonia   . Recurrent UTI   . Rotator cuff syndrome of left shoulder     Past Surgical History:  Procedure Laterality Date  . CHOLECYSTECTOMY    . INCISION AND DRAINAGE ABSCESS N/A 06/01/2014   Procedure: INCISION AND DRAINAGE ABSCESS;  Surgeon: Rolm Bookbinder, MD;  Location: [facility];  Service: General;  Laterality: N/A;  . IR GENERIC HISTORICAL  05/18/2016   IR EPIDUROGRAPHY 05/18/2016 [place], MD [facility]  . JOINT REPLACEMENT    . LAPAROSCOPIC GASTRIC SLEEVE RESECTION WITH HIATAL HERNIA REPAIR N/A 07/26/2016   Procedure: LAPAROSCOPIC GASTRIC SLEEVE RESECTION WITH HIATAL HERNIA REPAIR, UPPER ENDOSCOPY;  Surgeon: Johnathan Hausen, MD;  Location: [facility];  Service: General;  Laterality: N/A;  . LITHOTRIPSY    . REPLACEMENT TOTAL KNEE BILATERAL    . TUBAL LIGATION      Family History  Problem Relation Age of Onset  . Coronary artery disease Unknown   . Hypertension Unknown   . Diabetes Unknown   . Obesity Unknown   . Diabetes Mother   . Hypertension Mother     Social History Social  History   Tobacco Use  . Smoking status: Never Smoker  . Smokeless tobacco: Never Used  Substance Use Topics  . Alcohol use: No  . Drug use: No    Allergies  Allergen Reactions  . Propoxyphene N-Acetaminophen Palpitations    Current Outpatient Medications  Medication Sig Dispense Refill  . ALPRAZolam (XANAX) 1 MG tablet Take 1 mg by mouth 3 (three) times daily as needed for anxiety.     . benazepril (LOTENSIN) 20 MG tablet Take 20 mg by mouth daily at 2 PM.    . colchicine 0.6 MG tablet Take 1 tablet (0.6 mg total) by mouth daily. 30 tablet 0  . furosemide (LASIX) 20 MG tablet Take 20 mg by mouth daily at 2 PM.     . ibuprofen (ADVIL,MOTRIN) 800 MG tablet Take 1 tablet (800 mg total) by mouth every 8 (eight) hours as needed. 90 tablet 2  . metoprolol (TOPROL-XL) 200 MG 24 hr tablet Take 200 mg by mouth daily at 2 PM.     . oxyCODONE (ROXICODONE) 5 MG/5ML solution Take 5-10 mLs (5-10 mg total) by mouth every 4 (four) hours as needed for moderate pain or severe pain. 200 mL 0  . pantoprazole (PROTONIX) 40 MG tablet Take 40 mg  by mouth daily at 2 PM.      No current facility-administered medications for this visit.     Review of Systems Review of Systems  Blood pressure (!) 143/86, pulse 69, weight (!) 345 lb 6.4 oz (156.7 kg), last menstrual period 04/11/2009.  Physical Exam Physical Exam Pleasant Well nourished, well hydrated Black female, no apparent distress Breathing, conversing, and ambulating normally Large breasts with slightly inverted nipple bilaterally (She reports that this is her normal state) I cannot feel any masses  Data Reviewed Cytology - PAP  Order: [id number]  Status:  Edited Result - FINAL Visible to patient:  No (Not Released) Next appt:  Today at 03:00 PM in Sports Medicine Maria Mcmurray, MD) Dx:  Well woman exam with routine gynecolo...  Component 79mo ago  Adequacy Satisfactory for evaluation endocervical/transformation zone component PRESENT.    Diagnosis NEGATIVE FOR INTRAEPITHELIAL LESIONS OR MALIGNANCY.   Bacterial vaginitis Negative for Bacterial Vaginitis Microorganisms   Comment: Normal Reference Range - Negative  Candida vaginitis Negative for Candida species   Comment: Normal Reference Range - Negative  Chlamydia Negative   Comment: Normal Reference Range - Negative  Neisseria gonorrhea Negative   Comment: Normal Reference Range - Negative  Trichomonas Negative   Comment: Normal Reference Range - Negative  HPV NOT DETECTED            Assessment   left nipple discharge     Plan    Check diagnostic mammogram, tsh, and prolactin       Maria [name] Maria [name] 04/18/2017, 11:51 AM

## 2017-04-19 LAB — TSH: TSH: 1.44 u[IU]/mL (ref 0.450–4.500)

## 2017-04-19 LAB — PROLACTIN: Prolactin: 11.1 ng/mL (ref 4.8–23.3)

## 2017-04-21 ENCOUNTER — Other Ambulatory Visit: Payer: Self-pay | Admitting: Obstetrics & Gynecology

## 2017-04-21 ENCOUNTER — Ambulatory Visit
Admission: RE | Admit: 2017-04-21 | Discharge: 2017-04-21 | Disposition: A | Discharge status: Home / Self Care | Payer: PPO | Source: Ambulatory Visit | Attending: Obstetrics & Gynecology | Admitting: Obstetrics & Gynecology

## 2017-04-21 DIAGNOSIS — N6452 Nipple discharge: Secondary | ICD-10-CM

## 2017-04-21 DIAGNOSIS — R928 Other abnormal and inconclusive findings on diagnostic imaging of breast: Secondary | ICD-10-CM | POA: Diagnosis not present

## 2017-04-21 DIAGNOSIS — N6489 Other specified disorders of breast: Secondary | ICD-10-CM | POA: Diagnosis not present

## 2017-04-24 ENCOUNTER — Ambulatory Visit: Payer: Self-pay | Admitting: Obstetrics & Gynecology

## 2017-04-26 ENCOUNTER — Other Ambulatory Visit: Payer: Self-pay | Admitting: Obstetrics & Gynecology

## 2017-04-26 DIAGNOSIS — N6452 Nipple discharge: Secondary | ICD-10-CM

## 2017-04-27 ENCOUNTER — Ambulatory Visit
Admission: RE | Admit: 2017-04-27 | Discharge: 2017-04-27 | Disposition: A | Discharge status: Home / Self Care | Payer: PPO | Source: Ambulatory Visit | Attending: Obstetrics & Gynecology | Admitting: Obstetrics & Gynecology

## 2017-04-27 DIAGNOSIS — N6489 Other specified disorders of breast: Secondary | ICD-10-CM | POA: Diagnosis not present

## 2017-04-27 DIAGNOSIS — N6452 Nipple discharge: Secondary | ICD-10-CM

## 2017-04-27 DIAGNOSIS — R921 Mammographic calcification found on diagnostic imaging of breast: Secondary | ICD-10-CM | POA: Diagnosis not present

## 2017-04-27 DIAGNOSIS — N6321 Unspecified lump in the left breast, upper outer quadrant: Secondary | ICD-10-CM | POA: Diagnosis not present

## 2017-04-27 DIAGNOSIS — D242 Benign neoplasm of left breast: Secondary | ICD-10-CM | POA: Diagnosis not present

## 2017-05-10 DIAGNOSIS — D369 Benign neoplasm, unspecified site: Secondary | ICD-10-CM | POA: Diagnosis not present

## 2017-05-29 ENCOUNTER — Ambulatory Visit: Payer: Self-pay | Admitting: Surgery

## 2017-06-07 DIAGNOSIS — M542 Cervicalgia: Secondary | ICD-10-CM | POA: Diagnosis not present

## 2017-06-07 DIAGNOSIS — D485 Neoplasm of uncertain behavior of skin: Secondary | ICD-10-CM | POA: Diagnosis not present

## 2017-06-07 DIAGNOSIS — N62 Hypertrophy of breast: Secondary | ICD-10-CM | POA: Diagnosis not present

## 2017-06-07 DIAGNOSIS — M546 Pain in thoracic spine: Secondary | ICD-10-CM | POA: Diagnosis not present

## 2017-06-15 ENCOUNTER — Encounter (HOSPITAL_COMMUNITY): Payer: Self-pay

## 2017-06-15 NOTE — Pre-Procedure Instructions (Signed)
Maria [name]  06/15/2017      [id number] [name], [place] [address] Phone: [phone] Fax: [phone]  [facility] [place] - [place], [place] - [address] AT [address] [place] [address] Phone: [phone] Fax: [phone]    Your procedure is scheduled on March 15  Report to [facility] at 5:30 A.M.  Call this number if you have problems the morning of surgery:  [phone]   Remember:  Do not eat food or drink liquids after midnight.  Take these medicines the morning of surgery with A SIP OF WATER : alprazolam (xanax), colchicine, metoprolol (toprol-XL), oxycodone if needed              7 days prior to surgery STOP taking any Aspirin(unless otherwise instructed by your surgeon), Aleve, Naproxen, Ibuprofen, Motrin, Advil, Goody's, BC's, all herbal medications, fish oil, and all vitamins   Do not wear jewelry, make-up or nail polish.  Do not wear lotions, powders, or perfumes, or deodorant.  Do not shave 48 hours prior to surgery.  Men may shave face and neck.  Do not bring valuables to the hospital.  [facility] is not responsible for any belongings or valuables.  Contacts, dentures or bridgework may not be worn into surgery.  Leave your suitcase in the car.  After surgery it may be brought to your room.  For patients admitted to the hospital, discharge time will be determined by your treatment team.  Patients discharged the day of surgery will not be allowed to drive home.   Name and phone number of your driver:   Special instructions:  [name]- Preparing For Surgery  Before surgery, you can play an important role. Because skin is not sterile, your skin needs to be as free of germs as possible. You can reduce the number of germs on your skin by washing with CHG (chlorahexidine gluconate) Soap before surgery.  CHG is  an antiseptic cleaner which kills germs and bonds with the skin to continue killing germs even after washing.  Please do not use if you have an allergy to CHG or antibacterial soaps. If your skin becomes reddened/irritated stop using the CHG.  Do not shave (including legs and underarms) for at least 48 hours prior to first CHG shower. It is OK to shave your face.  Please follow these instructions carefully.   1. Shower the NIGHT BEFORE SURGERY and the MORNING OF SURGERY with CHG.   2. If you chose to wash your hair, wash your hair first as usual with your normal shampoo.  3. After you shampoo, rinse your hair and body thoroughly to remove the shampoo.  4. Use CHG as you would any other liquid soap. You can apply CHG directly to the skin and wash gently with a scrungie or a clean washcloth.   5. Apply the CHG Soap to your body ONLY FROM THE NECK DOWN.  Do not use on open wounds or open sores. Avoid contact with your eyes, ears, mouth and genitals (private parts). Wash Face and genitals (private parts)  with your normal soap.  6. Wash thoroughly, paying special attention to the area where your surgery will be performed.  7. Thoroughly rinse your body with warm water from the neck down.  8. DO NOT shower/wash with your normal soap after using and rinsing off the CHG  Soap.  9. Pat yourself dry with a CLEAN TOWEL.  10. Wear CLEAN PAJAMAS to bed the night before surgery, wear comfortable clothes the morning of surgery  11. Place CLEAN SHEETS on your bed the night of your first shower and DO NOT SLEEP WITH PETS.    Day of Surgery: Do not apply any deodorants/lotions. Please wear clean clothes to the hospital/surgery center.      Please read over the following fact sheets that you were given. Coughing and Deep Breathing and Surgical Site Infection Prevention

## 2017-06-16 ENCOUNTER — Inpatient Hospital Stay (HOSPITAL_COMMUNITY)
Admission: RE | Admit: 2017-06-16 | Discharge: 2017-06-16 | Disposition: A | Discharge status: Home / Self Care | Payer: PPO | Source: Ambulatory Visit

## 2017-06-16 ENCOUNTER — Ambulatory Visit: Payer: Self-pay | Admitting: Surgery

## 2017-06-16 DIAGNOSIS — D242 Benign neoplasm of left breast: Secondary | ICD-10-CM

## 2017-06-16 NOTE — H&P (Signed)
Maria [name] Documented: 05/10/2017 2:04 PM Location: [facility] Office Patient #: [id number] DOB: 1960-09-14 Divorced / Language: Cleophus Molt / Race: Black or African American Female   History of Present Illness Rodman Key B. Hassell Done MD; 05/10/2017 2:38 PM) The patient is a 57 year old female presenting status-post bariatric surgery. She underwent mammography and was found to have 2 areas in her left breast that were suspicious. There were biopsied in the left breast in the upper outer quadrant posteriorly showed fibroadenomatoid change with calcifications and the left periareolar area at 12:30 showed a ductal papilloma. It was thought that these could be excised with a seed localization. I spoke with [facility] see if with her BMI of 53 height of 5 feet 7 and a weight of 342 if she would be a candidate for cone day surgery. She will let me know tomorrow. I would suspect she would need to see localizations to remove both of these areas. She would in addition like to have breast reduction surgery at some point.   Allergies Malachi Bonds, CMA; 05/10/2017 2:05 PM) Propoxyphene N-APAP *ANALGESICS - OPIOID*   Medication History Malachi Bonds, CMA; 05/10/2017 2:05 PM) Multivitamin Adult (Oral) Active. Calcium Citrate (500MG  Capsule, Oral) Active. Pantoprazole Sodium (40MG  Tablet DR, Oral) Active. Metoprolol Succinate (200MG  Tablet ER, Oral) Active. Benazepril HCl (20MG  Tablet, Oral) Active. Xanax (1MG  Tablet, Oral) Active. Soma (350MG  Tablet, Oral) Active. Colcrys (0.6MG  Tablet, Oral) Active. HydroDiuril (25MG  Tablet, Oral) Active. Medications Reconciled  Vitals (Chemira Jones CMA; 05/10/2017 2:04 PM) 05/10/2017 2:04 PM Weight: 342.2 lb Height: 67.25in Body Surface Area: 2.55 m Body Mass Index: 53.2 kg/m  Pulse: 105 (Regular)  BP: 140/80 (Sitting, Left Arm, Standard)       Physical Exam (Frazier Balfour B. Hassell Done MD; 05/10/2017 2:39 PM) HEENT  unremarkable Neck supple Chest clear Heart SR Breast:  Two areas in her left breast     Assessment & Plan Rodman Key B. 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 Plan seed localization and excision hopefully CDS surgery.

## 2017-06-16 NOTE — Pre-Procedure Instructions (Addendum)
Maria [name]  06/16/2017      [id number] [name], [name] [address] Phone: [phone] Fax: [phone]  [facility] [place] - [place], [place] - [address] AT [address] [place] [address] Phone: [phone] Fax: [phone]    Your procedure is scheduled on March 15  Report to [facility] at 5:30 A.M.  Call this number if you have problems the morning of surgery:  [phone]   Remember:  Do not eat food or drink liquids after midnight.  Please complete your PRE-SURGERY ENSURE that was given to before you leave your house the morning of surgery.  Please, if able, drink it in one sitting. DO NOT SIP.   Take these medicines the morning of surgery with A SIP OF WATER :  alprazolam (xanax) Colchicine metoprolol (toprol-XL) oxycodone if needed              7 days prior to surgery STOP taking any Aspirin (unless otherwise instructed by your surgeon), Aleve, Naproxen, Ibuprofen, Motrin, Advil, Goody's, BC's, all herbal medications, fish oil, and all vitamins   Do not wear jewelry, make-up or nail polish.  Do not wear lotions, powders, or perfumes, or deodorant.  Do not shave 48 hours prior to surgery.  Men may shave face and neck.  Do not bring valuables to the hospital.  [facility] is not responsible for any belongings or valuables.  Hearing aids, eyeglasses, contacts, dentures or bridgework may not be worn into surgery.  Leave your suitcase in the car.  After surgery it may be brought to your room.  For patients admitted to the hospital, discharge time will be determined by your treatment team.  Patients discharged the day of surgery will not be allowed to drive home.   Name and phone number of your driver:   Special instructions:  [facility] [facility]- Preparing For Surgery  Before surgery, you can play an important role.  Because skin is not sterile, your skin needs to be as free of germs as possible. You can reduce the number of germs on your skin by washing with CHG (chlorahexidine gluconate) Soap before surgery.  CHG is an antiseptic cleaner which kills germs and bonds with the skin to continue killing germs even after washing.  Please do not use if you have an allergy to CHG or antibacterial soaps. If your skin becomes reddened/irritated stop using the CHG.  Do not shave (including legs and underarms) for at least 48 hours prior to first CHG shower. It is OK to shave your face.  Please follow these instructions carefully.   1. Shower the NIGHT BEFORE SURGERY and the MORNING OF SURGERY with CHG.   2. If you chose to wash your hair, wash your hair first as usual with your normal shampoo.  3. After you shampoo, rinse your hair and body thoroughly to remove the shampoo.  4. Use CHG as you would any other liquid soap. You can apply CHG directly to the skin and wash gently with a scrungie or a clean washcloth.   5. Apply the CHG Soap to your body ONLY FROM THE NECK DOWN.  Do not use on open wounds or open sores. Avoid contact with your eyes, ears, mouth and genitals (private parts). Wash Face and genitals (private parts)  with your normal soap.  6. Wash thoroughly, paying special attention to  the area where your surgery will be performed.  7. Thoroughly rinse your body with warm water from the neck down.  8. DO NOT shower/wash with your normal soap after using and rinsing off the CHG Soap.  9. Pat yourself dry with a CLEAN TOWEL.  10. Wear CLEAN PAJAMAS to bed the night before surgery, wear comfortable clothes the morning of surgery  11. Place CLEAN SHEETS on your bed the night of your first shower and DO NOT SLEEP WITH PETS.    Day of Surgery: Shower as stated above. Do not apply any deodorants/lotions.  Please wear clean clothes to the hospital/surgery center.      Please read over the following  fact sheets that you were given.

## 2017-06-19 ENCOUNTER — Other Ambulatory Visit: Payer: Self-pay | Admitting: Surgery

## 2017-06-19 DIAGNOSIS — D242 Benign neoplasm of left breast: Secondary | ICD-10-CM

## 2017-06-21 ENCOUNTER — Other Ambulatory Visit: Payer: Self-pay

## 2017-06-21 ENCOUNTER — Encounter (HOSPITAL_COMMUNITY)
Admission: RE | Admit: 2017-06-21 | Discharge: 2017-06-21 | Disposition: A | Discharge status: Home / Self Care | Payer: PPO | Source: Ambulatory Visit | Attending: Surgery | Admitting: Surgery

## 2017-06-21 ENCOUNTER — Encounter (HOSPITAL_COMMUNITY): Payer: Self-pay

## 2017-06-21 ENCOUNTER — Encounter (HOSPITAL_COMMUNITY): Payer: Self-pay | Admitting: Emergency Medicine

## 2017-06-21 DIAGNOSIS — Z01812 Encounter for preprocedural laboratory examination: Secondary | ICD-10-CM | POA: Diagnosis not present

## 2017-06-21 DIAGNOSIS — I10 Essential (primary) hypertension: Secondary | ICD-10-CM | POA: Diagnosis not present

## 2017-06-21 DIAGNOSIS — Z01810 Encounter for preprocedural cardiovascular examination: Secondary | ICD-10-CM | POA: Diagnosis not present

## 2017-06-21 HISTORY — DX: Anemia, unspecified: D64.9

## 2017-06-21 LAB — SURGICAL PCR SCREEN
MRSA, PCR: NEGATIVE
Staphylococcus aureus: NEGATIVE

## 2017-06-21 LAB — BASIC METABOLIC PANEL
Anion gap: 12 (ref 5–15)
BUN: 10 mg/dL (ref 6–20)
CO2: 24 mmol/L (ref 22–32)
Calcium: 8.9 mg/dL (ref 8.9–10.3)
Chloride: 101 mmol/L (ref 101–111)
Creatinine, Ser: 0.97 mg/dL (ref 0.44–1.00)
GFR calc Af Amer: >60 mL/min (ref >60)
GFR calc non Af Amer: >60 mL/min (ref >60)
Glucose, Bld: 333 mg/dL — ABNORMAL HIGH (ref 65–99)
Potassium: 4.1 mmol/L (ref 3.5–5.1)
Sodium: 137 mmol/L (ref 135–145)

## 2017-06-21 LAB — CBC
HCT: 44 % (ref 36.0–46.0)
Hemoglobin: 14 g/dL (ref 12.0–15.0)
MCH: 28.2 pg (ref 26.0–34.0)
MCHC: 31.8 g/dL (ref 30.0–36.0)
MCV: 88.7 fL (ref 78.0–100.0)
Platelets: 254 10*3/uL (ref 150–400)
RBC: 4.96 MIL/uL (ref 3.87–5.11)
RDW: 13.2 % (ref 11.5–15.5)
WBC: 5.9 10*3/uL (ref 4.0–10.5)

## 2017-06-21 LAB — HEMOGLOBIN A1C
Hgb A1c MFr Bld: 11.7 % — ABNORMAL HIGH (ref 4.8–5.6)
Mean Plasma Glucose: 289.09 mg/dL

## 2017-06-21 NOTE — Pre-Procedure Instructions (Addendum)
Maria [name]  06/21/2017    Your procedure is scheduled on Friday, June 23, 2017 at 7:30 AM.   Report to [facility] Admitting at 5:30 AM.   Call this number if you have problems the morning of surgery: [phone]   Questions prior to day of surgery, please call [phone] between 8 & 4 PM.   Remember:  Do not eat food or drink liquids after midnight Thursday, 06/22/17  Take these medicines the morning of surgery with A SIP OF WATER : Alprazolam (Xanax) - if needed, Colchicine, Metoprolol (Toprol-XL), Oxycodone - if needed  Stop NSAIDS (Ibuprofen, Aleve, etc) and Multivitamins as of today. Do not use Aspirin products prior to surgery.  Drink Ensure the morning of surgery as you are leaving for the hospital.   Do not wear jewelry, make-up or nail polish.  Do not wear lotions, powders, perfumes or deodorant.  Do not shave 48 hours prior to surgery.    Do not bring valuables to the hospital.  [facility] is not responsible for any belongings or valuables.  Contacts, dentures or bridgework may not be worn into surgery.  Leave your suitcase in the car.  After surgery it may be brought to your room.  Patients discharged the day of surgery will not be allowed to drive home.   [facility]- Preparing For Surgery  Before surgery, you can play an important role. Because skin is not sterile, your skin needs to be as free of germs as possible. You can reduce the number of germs on your skin by washing with CHG (chlorahexidine gluconate) Soap before surgery.  CHG is an antiseptic cleaner which kills germs and bonds with the skin to continue killing germs even after washing.  Please do not use if you have an allergy to CHG or antibacterial soaps. If your skin becomes reddened/irritated stop using the CHG.  Do not shave (including legs and underarms) for at least 48 hours prior to first CHG shower. It is OK to shave your face.  Please follow these instructions  carefully.   1. Shower the NIGHT BEFORE SURGERY and the MORNING OF SURGERY with CHG.   2. If you chose to wash your hair, wash your hair first as usual with your normal shampoo.  3. After you shampoo, rinse your hair and body thoroughly to remove the shampoo.  4. Use CHG as you would any other liquid soap. You can apply CHG directly to the skin and wash gently with a scrungie or a clean washcloth.   5. Apply the CHG Soap to your body ONLY FROM THE NECK DOWN.  Do not use on open wounds or open sores. Avoid contact with your eyes, ears, mouth and genitals (private parts). Wash Face and genitals (private parts)  with your normal soap.  6. Wash thoroughly, paying special attention to the area where your surgery will be performed.  7. Thoroughly rinse your body with warm water from the neck down.  8. DO NOT shower/wash with your normal soap after using and rinsing off the CHG Soap.  9. Pat yourself dry with a CLEAN TOWEL.  10. Wear CLEAN PAJAMAS to bed the night before surgery.  11. Place CLEAN SHEETS on your bed the night of your first shower and DO NOT SLEEP WITH PETS.  Day of Surgery: Shower as above. Do not apply any deodorants/lotions. Please wear clean clothes to the hospital.    Please read over the fact sheets that you were given.

## 2017-06-21 NOTE — Progress Notes (Signed)
Pt denies cardiac history, chest pain or sob. Pt states she "used to be" diabetic, but is not since she had gastric bypass. Pt does not check her blood sugar nor has she had an A1C done since before her surgery. Pt also states she had a stress test done prior to her gastric bypass surgery in April, 2018. She said it was done by an "[place]" doctor across the street. I have called Dr. Irven Shelling office, Dr. Merrilee Jansky office and Dr. Zenia Resides office and none of them have her as a pt. I called Dr. Earlie Server office to see if they have a copy of it and spoke with Sunday Spillers, Therapist, sports. She states the only thing that they have is an EKG and she does not see where a Stress test was ordered.

## 2017-06-22 ENCOUNTER — Inpatient Hospital Stay: Admission: RE | Admit: 2017-06-22 | Payer: PPO | Source: Ambulatory Visit

## 2017-06-23 ENCOUNTER — Encounter (HOSPITAL_COMMUNITY): Admission: RE | Payer: Self-pay | Source: Ambulatory Visit

## 2017-06-23 ENCOUNTER — Ambulatory Visit (HOSPITAL_COMMUNITY): Admission: RE | Admit: 2017-06-23 | Payer: PPO | Source: Ambulatory Visit | Admitting: Surgery

## 2017-06-23 SURGERY — BREAST LUMPECTOMY WITH RADIOACTIVE SEED LOCALIZATION
Anesthesia: General | Site: Breast | Laterality: Left

## 2017-06-26 ENCOUNTER — Other Ambulatory Visit: Payer: Self-pay

## 2017-06-26 MED ORDER — FLUCONAZOLE 150 MG PO TABS: 150.0000 mg | ORAL_TABLET | Freq: Once | ORAL | 0 refills | Status: AC

## 2017-06-26 NOTE — Telephone Encounter (Signed)
[facility] [place]

## 2017-06-27 ENCOUNTER — Other Ambulatory Visit: Payer: Self-pay

## 2017-06-27 MED ORDER — FLUCONAZOLE 150 MG PO TABS: 150.0000 mg | ORAL_TABLET | Freq: Once | ORAL | 0 refills | Status: DC

## 2017-06-27 NOTE — Telephone Encounter (Signed)
Refill on diflucan. Patient thinks she has yeast.

## 2017-08-04 ENCOUNTER — Other Ambulatory Visit: Payer: Self-pay | Admitting: Family Medicine

## 2017-08-07 ENCOUNTER — Other Ambulatory Visit: Payer: Self-pay

## 2017-08-07 MED ORDER — METRONIDAZOLE 500 MG PO TABS: 500.0000 mg | ORAL_TABLET | Freq: Two times a day (BID) | ORAL | 0 refills | Status: DC

## 2017-08-07 NOTE — Telephone Encounter (Signed)
Patient thinks she has BV again and would like [place] to call flagyl before coming in for appointment. Per protcol [facility] [place]

## 2017-08-24 IMAGING — MR MR CERVICAL SPINE W/O CM
4 of 5 series · 28 of 48 positions shown · non-contrast
Comparison: 07/16/2009 cervical spine MR.

CLINICAL DATA: 55-year-old female with neck pain and tingling with
numbness right hand for 3 months. Initial encounter.

EXAM:
MRI CERVICAL SPINE WITHOUT CONTRAST
TECHNIQUE: Multiplanar, multisequence MR imaging of the cervical spine was
performed. No intravenous contrast was administered.

[Series 3: tir sag · sagittal · 3.0mm · 0.41mm/px · 6 of 13 slices shown]
[im 1/13]
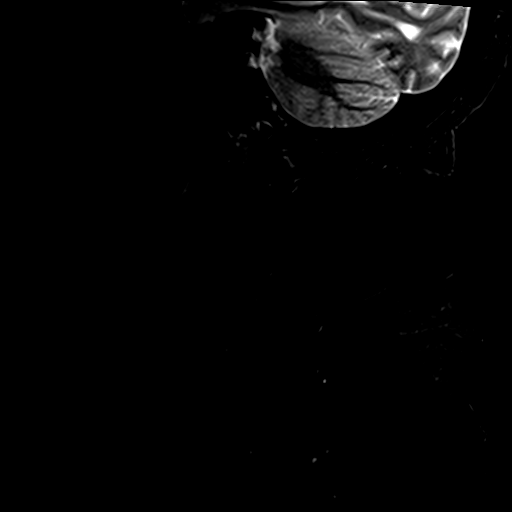
[im 3/13]
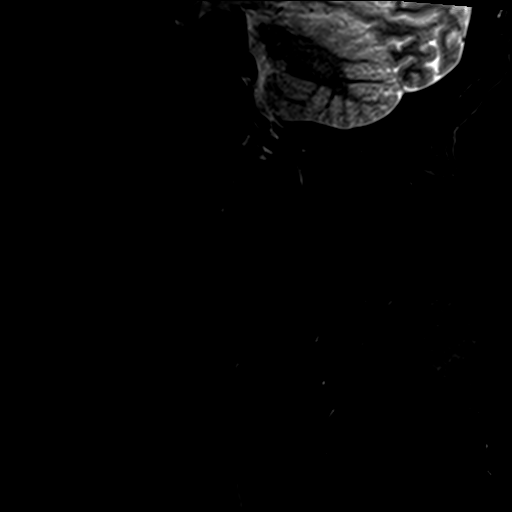
[im 5/13]
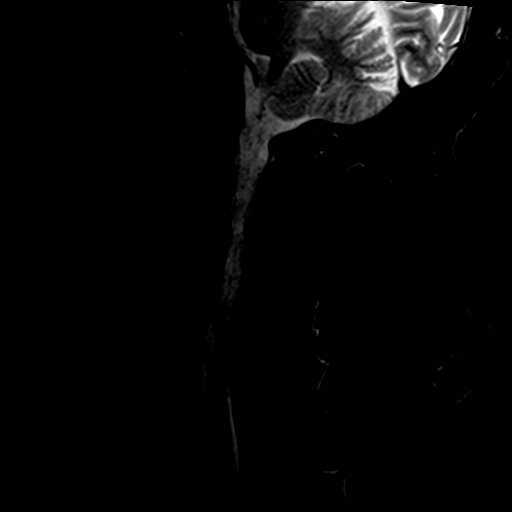
[im 8/13]
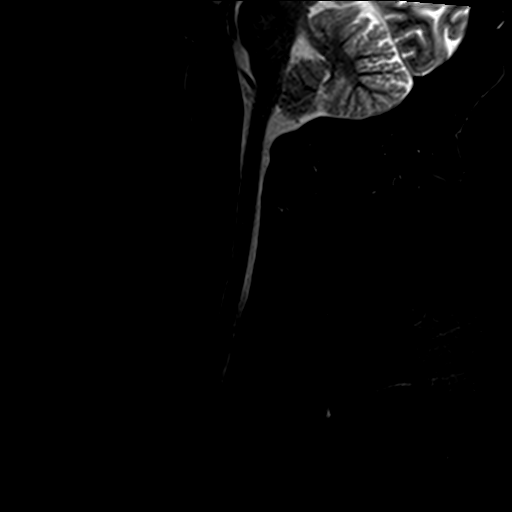
[im 10/13]
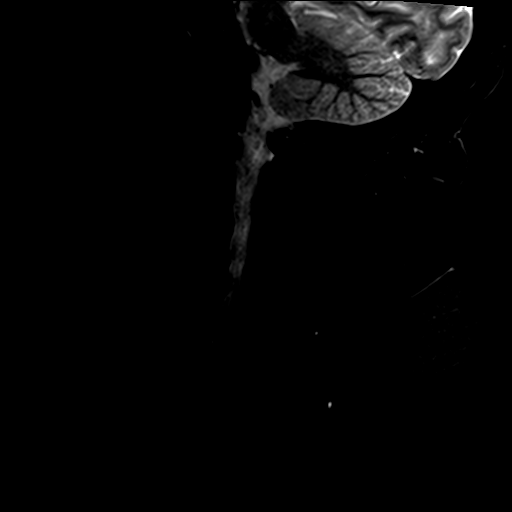
[im 13/13]
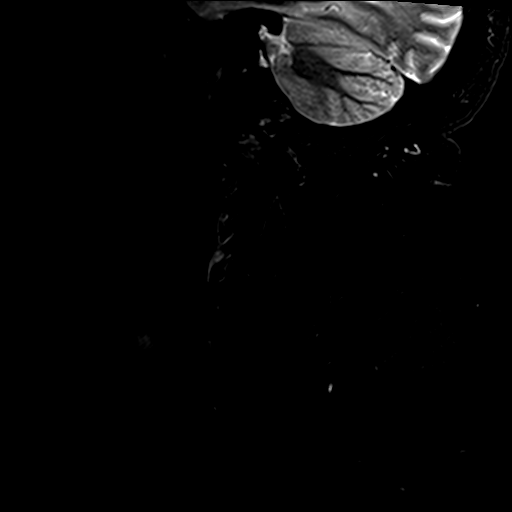

[Series 4: T2 · sagittal · 3.0mm · 0.66mm/px · 7 of 13 slices shown (1 of 2)]
[im 1/13]
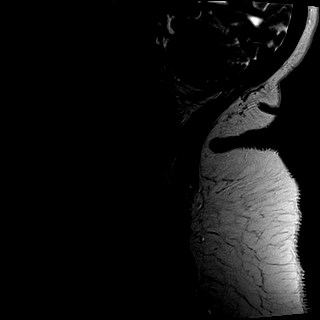
[im 3/13]
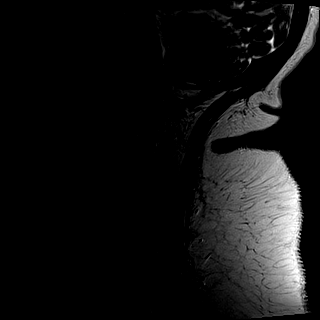
[im 5/13]
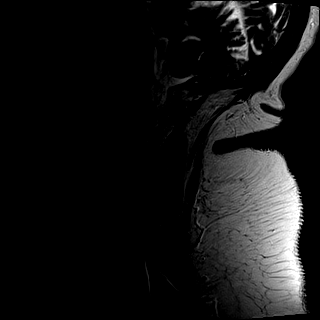
[im 7/13]
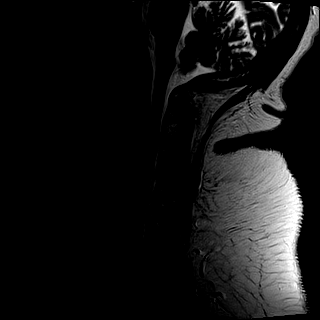
[im 9/13]
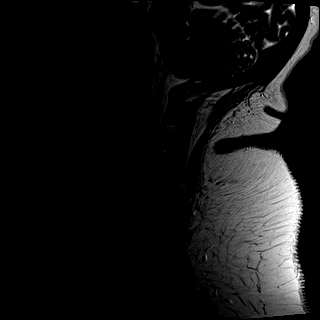
[im 11/13]
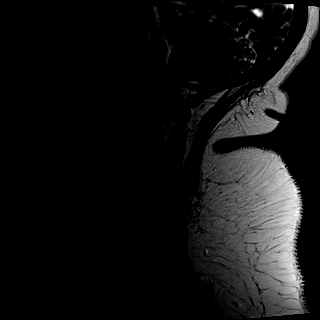
[im 13/13]
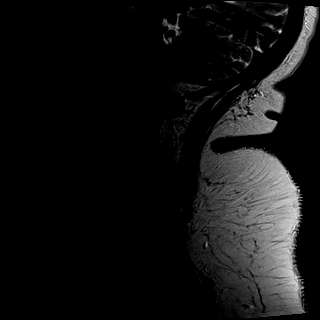

[Series 6: T2 · axial · 3.0mm · 0.70mm/px · z∈[-89,+12]mm · 8 of 28 slices shown (2 of 2)]
[im 1/28]
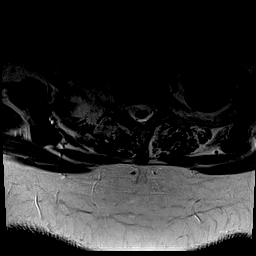
[im 5/28]
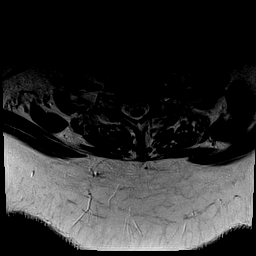
[im 9/28]
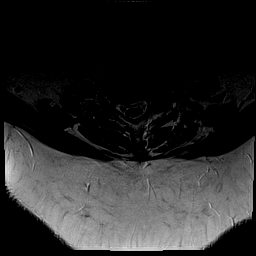
[im 13/28]
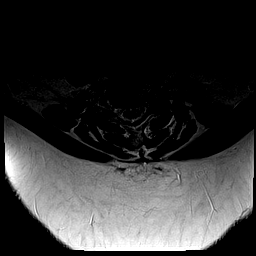
[im 15/28]
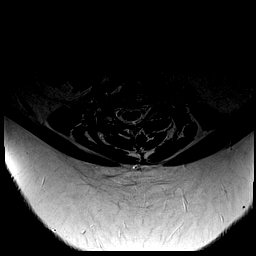
[im 19/28]
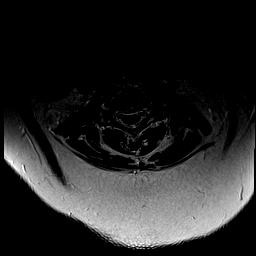
[im 23/28]
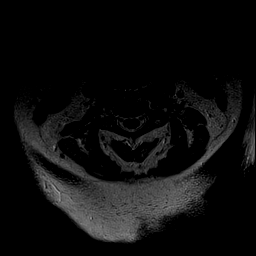
[im 28/28]
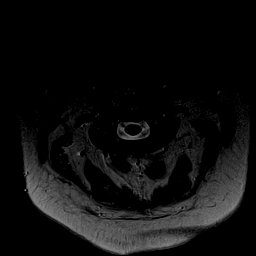

[Series 7: T1 · sagittal · 3.0mm · 0.41mm/px · 7 of 13 slices shown]
[im 1/13]
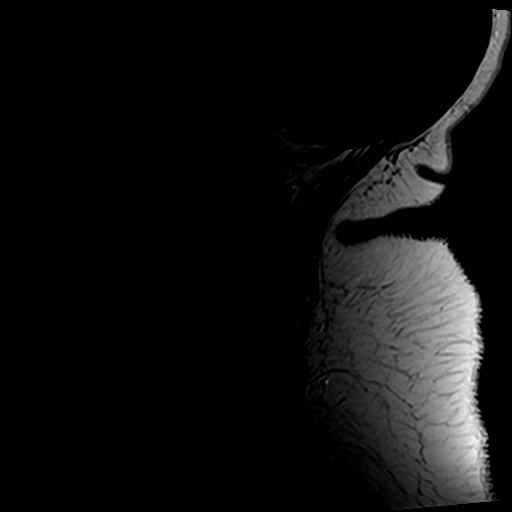
[im 3/13]
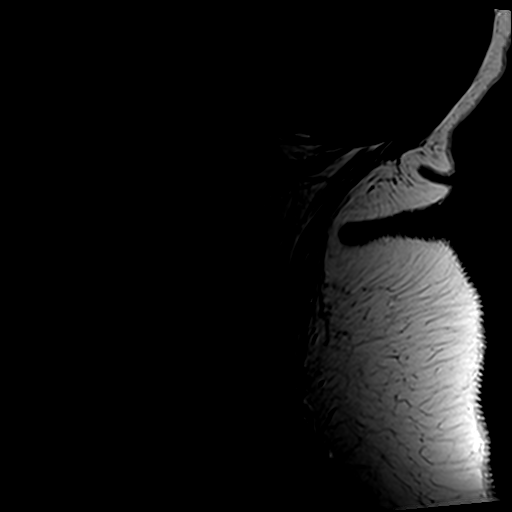
[im 5/13]
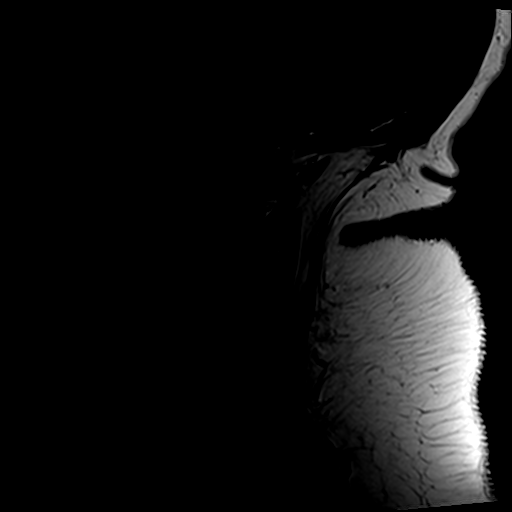
[im 7/13]
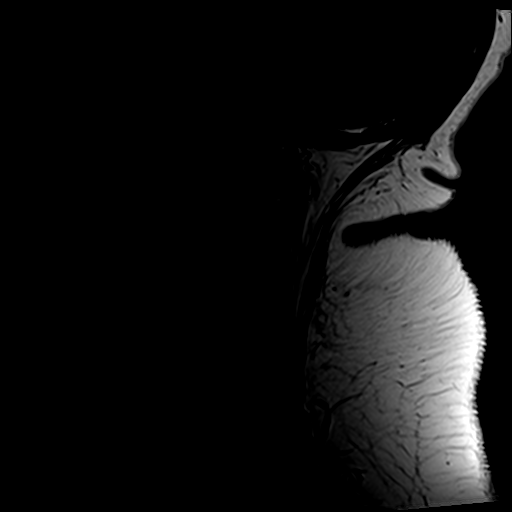
[im 9/13]
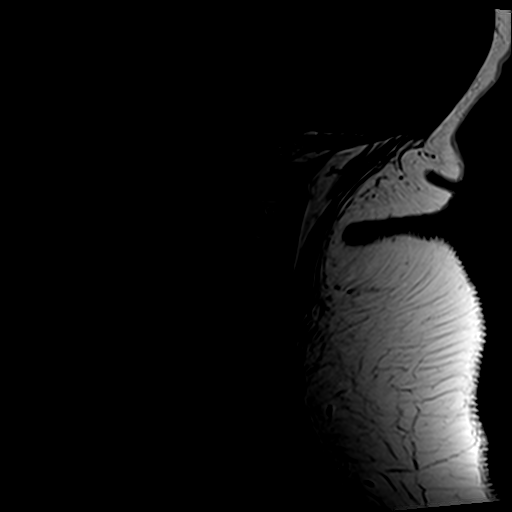
[im 11/13]
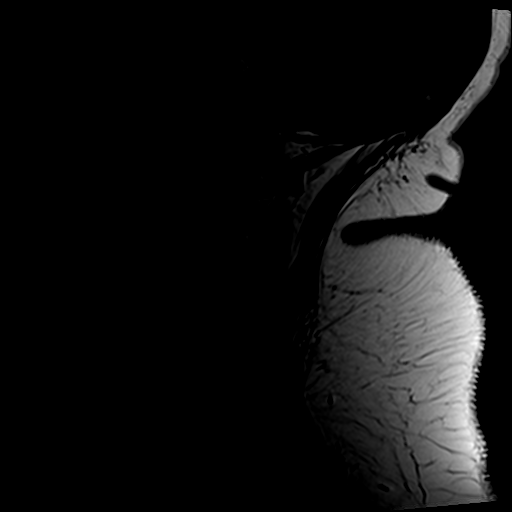
[im 13/13]
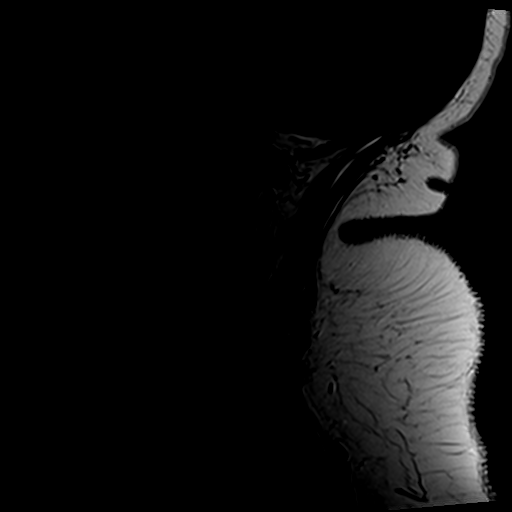

[28 of 48 positions shown; findings below may reference images not displayed]

FINDINGS: Alignment: Scoliosis and mild kyphosis centered at the C4-5 level.

Vertebrae: No worrisome focal osseous abnormality. Scattered small
focal fatty deposits.

Cord: No focal cord signal abnormality.

Posterior Fossa, vertebral arteries, paraspinal tissues: 1 cm right
lobe of thyroid lesion.

Disc levels:

C2-3:  Minimal bulge.  Minimal narrowing ventral thecal sac.

C3-4:  Minimal bulge.  Minimal narrowing ventral thecal sac.

C4-5: Shallow right paracentral protrusion with slight flattening
right ventral cord.

C5-6: Minimal bulge/ osteophyte. Minimal narrowing ventral thecal
sac. Left uncinate hypertrophy with mild to moderate left foraminal
narrowing.

C6-7: Shallow right paracentral protrusion with slight narrowing
right ventral thecal sac. Minimal right foraminal narrowing.

C7-T1: Minimal bulge. Minimal facet degenerative changes. No
significant spinal stenosis or foraminal narrowing.
IMPRESSION: Slight progression of degenerative changes including:

C4-5 shallow right paracentral protrusion with minimal flattening
right ventral cord.

C5-6 minimal bulge/ osteophyte. Minimal narrowing ventral thecal
sac. Left uncinate hypertrophy with mild to moderate left foraminal
narrowing.

C6-7 shallow right paracentral protrusion with slight narrowing
right ventral thecal sac. Minimal right foraminal narrowing.

## 2017-08-29 ENCOUNTER — Other Ambulatory Visit: Payer: Self-pay | Admitting: Family Medicine

## 2017-09-25 ENCOUNTER — Ambulatory Visit: Payer: PPO | Admitting: Obstetrics and Gynecology

## 2017-09-25 ENCOUNTER — Encounter: Payer: Self-pay | Admitting: Obstetrics and Gynecology

## 2017-09-25 ENCOUNTER — Ambulatory Visit (INDEPENDENT_AMBULATORY_CARE_PROVIDER_SITE_OTHER): Payer: PPO | Admitting: Obstetrics and Gynecology

## 2017-09-25 VITALS — BP 131/83 | HR 98 | Ht 68.0 in | Wt 332.0 lb

## 2017-09-25 DIAGNOSIS — N764 Abscess of vulva: Secondary | ICD-10-CM | POA: Diagnosis not present

## 2017-09-25 MED ORDER — SULFAMETHOXAZOLE-TRIMETHOPRIM 800-160 MG PO TABS: 1.0000 | ORAL_TABLET | Freq: Two times a day (BID) | ORAL | 1 refills | Status: DC

## 2017-09-26 NOTE — Progress Notes (Signed)
GYNECOLOGY OFFICE VISIT NOTE  History:  57 y.o. [id number] here today for boil on labia.  She has had these before and has had several drained. This one started Thursday and has gotten progressively worse, has not tried anything for it. States HA1C was very high last check.   Past Medical History:  Diagnosis Date  . Allergic rhinitis   . Anemia    1980's  . Anxiety   . Carpal tunnel syndrome, left    pt denies  . Depression   . Diabetes mellitus    has not taken medications since 07/2016 after bariatric surgery  . DUB (dysfunctional uterine bleeding)   . Dysrhythmia    tachycardic  . External hemorrhoid   . GERD (gastroesophageal reflux disease)   . Gout   . History of hiatal hernia   . History of kidney stones   . Hypertension   . Morbid obesity ([place])   . Obesity   . Osteoarthritis, knee   . Pneumonia   . Recurrent UTI   . Rotator cuff syndrome of left shoulder     Past Surgical History:  Procedure Laterality Date  . CHOLECYSTECTOMY    . INCISION AND DRAINAGE ABSCESS N/A 06/01/2014   Procedure: INCISION AND DRAINAGE ABSCESS;  Surgeon: Rolm Bookbinder, MD;  Location: [place];  Service: General;  Laterality: N/A;  . IR GENERIC HISTORICAL  05/18/2016   IR EPIDUROGRAPHY 05/18/2016 [place], MD [facility]  . JOINT REPLACEMENT    . LAPAROSCOPIC GASTRIC SLEEVE RESECTION WITH HIATAL HERNIA REPAIR N/A 07/26/2016   Procedure: LAPAROSCOPIC GASTRIC SLEEVE RESECTION WITH HIATAL HERNIA REPAIR, UPPER ENDOSCOPY;  Surgeon: Johnathan Hausen, MD;  Location: [facility];  Service: General;  Laterality: N/A;  . LITHOTRIPSY    . REPLACEMENT TOTAL KNEE BILATERAL    . TUBAL LIGATION       Current Outpatient Medications:  .  B-D UF III MINI PEN NEEDLES 31G X 5 MM MISC, USE TO INJECT LANTUS SOLOSTAR EVERY DAY AS DIRECTED, Disp: , Rfl: 0 .  benazepril (LOTENSIN) 40 MG tablet, Take 40 mg by mouth daily. , Disp: , Rfl:  .  Calcium-Vitamin D-Vitamin K (CALCIUM + D) [phone] MG-UNT-MCG  CHEW, Chew 2 each by mouth daily., Disp: , Rfl:  .  colchicine 0.6 MG tablet, Take 1 tablet (0.6 mg total) by mouth daily., Disp: 30 tablet, Rfl: 0 .  fluconazole (DIFLUCAN) 150 MG tablet, TAKE 1 TABLET BY MOUTH AS ONE DOSE, Disp: 1 tablet, Rfl: 0 .  furosemide (LASIX) 20 MG tablet, Take 20 mg by mouth daily. , Disp: , Rfl:  .  glipiZIDE (GLUCOTROL) 5 MG tablet, TK 1 T PO QAM, Disp: , Rfl: 5 .  LANTUS SOLOSTAR 100 UNIT/ML Solostar Pen, INJECT 10 UNITS SUBCUTONEOUSLY AT BEDTIME, Disp: , Rfl: 3 .  metFORMIN (GLUCOPHAGE) 850 MG tablet, TK 1 T PO BID, Disp: , Rfl: 5 .  metoprolol (TOPROL-XL) 200 MG 24 hr tablet, Take 200 mg by mouth daily. , Disp: , Rfl:  .  metroNIDAZOLE (FLAGYL) 500 MG tablet, Take 1 tablet (500 mg total) by mouth 2 (two) times daily., Disp: 14 tablet, Rfl: 0 .  Multiple Vitamins-Minerals (MULTIVITAMIN WITH MINERALS) tablet, Take 1 tablet by mouth daily., Disp: , Rfl:  .  oxyCODONE (ROXICODONE) 15 MG immediate release tablet, Take 15 mg by mouth every 6 (six) hours as needed for pain., Disp: , Rfl:  .  pantoprazole (PROTONIX) 40 MG tablet, Take 40 mg by mouth daily at 2 PM. ,  Disp: , Rfl:  .  sulfamethoxazole-trimethoprim (BACTRIM DS,SEPTRA DS) 800-160 MG tablet, Take 1 tablet by mouth 2 (two) times daily., Disp: 14 tablet, Rfl: 1  The following portions of the patient's history were reviewed and updated as appropriate: allergies, current medications, past family history, past medical history, past social history, past surgical history and problem list.    Review of Systems:  Pertinent items noted in HPI and remainder of comprehensive ROS otherwise negative.   Objective:  Physical Exam BP 131/83   Pulse 98   Ht 5\' 8"  (1.727 m)   Wt (!) 332 lb (150.6 kg)   LMP 04/11/2009   BMI 50.48 kg/m  CONSTITUTIONAL: Well-developed, well-nourished female in no acute distress. Obese HENT:  Normocephalic, atraumatic. External right and left ear normal. Oropharynx is clear and  moist NEUROLOGIC: Alert and oriented to person, place, and time. Normal reflexes, muscle tone coordination. No cranial nerve deficit noted. PSYCHIATRIC: Normal mood and affect. Normal behavior. Normal judgment and thought content. ABDOMEN: Soft, no distention noted.   PELVIC: normal appearing external female genitalia, with 4x2x2 cm fluctuant abscess extremely tender to touch on left labia majora close to groin fold at level of urethra MUSCULOSKELETAL: Normal range of motion. No edema noted.  Labs and Imaging No results found.  Assessment & Plan:  1. Labial abscess Recommended I&D, patient agreeable, please see procedure note. - Wound culture Patient to return 3-4 days for follow up as she has uncontrolled diabetes and is at high risk of worsening infection, needing further management.   Routine preventative health maintenance measures emphasized. Please refer to After Visit Summary for other counseling recommendations.   Return in about 3 days (around 09/28/2017).    Feliz Beam, M.D. Attending [place], [facility] for [employer], [place]

## 2017-09-26 NOTE — Progress Notes (Signed)
    GYNECOLOGY PROCEDURE NOTE  Maria [name] is a 56 y.o. [phone] here for incision and drainage of abscess on left labia.   Reviewed risks of incision and drainage including worsening infection, bleeding, damage to surrounding tissue and organ, inability to drain abscess, pain, possible need for further intervention. She verbalizes understanding and affirms desire to proceed. Consent signed.   Incision and drainage Procedure Patient identified, informed consent performed, consent signed. An adequate timeout was performed. The left labia was exposed and prepped with alcohol swab and then injected with 3 mL 1% lidocaine. The abscess was then prepped with betadine x3. A small incision was made in a vertical manner to good effect and exudate started to drain. A sample of this was taken and sent for culture. The abscess was then further explored and loculations were broken up with a sterile hemostat. Hemostasis obtained with small amount of pressure. She tolerated the procedure well.   Post incision instructions were given to patient. Bactrim was sent to the pharmacy with instructions. She will follow up in 4 days given her history of multiple abscesses and uncontrolled diabetes.    Feliz Beam, M.D. Attending [place], [facility] for [employer], [place]

## 2017-09-28 LAB — WOUND CULTURE

## 2017-09-29 ENCOUNTER — Ambulatory Visit: Payer: PPO | Admitting: Obstetrics & Gynecology

## 2017-10-04 ENCOUNTER — Ambulatory Visit: Payer: PPO | Admitting: Family Medicine

## 2017-10-13 DIAGNOSIS — R109 Unspecified abdominal pain: Secondary | ICD-10-CM | POA: Diagnosis not present

## 2017-11-09 DIAGNOSIS — E118 Type 2 diabetes mellitus with unspecified complications: Secondary | ICD-10-CM | POA: Diagnosis not present

## 2017-11-09 DIAGNOSIS — M546 Pain in thoracic spine: Secondary | ICD-10-CM | POA: Diagnosis not present

## 2017-11-09 DIAGNOSIS — Z79899 Other long term (current) drug therapy: Secondary | ICD-10-CM | POA: Diagnosis not present

## 2017-11-09 DIAGNOSIS — M129 Arthropathy, unspecified: Secondary | ICD-10-CM | POA: Diagnosis not present

## 2017-11-09 DIAGNOSIS — M545 Low back pain: Secondary | ICD-10-CM | POA: Diagnosis not present

## 2017-11-28 ENCOUNTER — Other Ambulatory Visit: Payer: Self-pay

## 2017-11-28 MED ORDER — FLUCONAZOLE 150 MG PO TABS: 150.0000 mg | ORAL_TABLET | Freq: Once | ORAL | 1 refills | Status: AC

## 2017-11-28 NOTE — Telephone Encounter (Signed)
Refill on Diflucan 150 mg

## 2017-12-07 DIAGNOSIS — E118 Type 2 diabetes mellitus with unspecified complications: Secondary | ICD-10-CM | POA: Diagnosis not present

## 2017-12-07 DIAGNOSIS — M1A0710 Idiopathic chronic gout, right ankle and foot, without tophus (tophi): Secondary | ICD-10-CM | POA: Diagnosis not present

## 2017-12-07 DIAGNOSIS — M109 Gout, unspecified: Secondary | ICD-10-CM | POA: Diagnosis not present

## 2017-12-07 DIAGNOSIS — J018 Other acute sinusitis: Secondary | ICD-10-CM | POA: Diagnosis not present

## 2017-12-08 ENCOUNTER — Encounter

## 2017-12-08 ENCOUNTER — Ambulatory Visit (INDEPENDENT_AMBULATORY_CARE_PROVIDER_SITE_OTHER): Payer: PPO | Admitting: Sports Medicine

## 2017-12-08 VITALS — BP 155/82 | Ht 68.0 in | Wt 308.0 lb

## 2017-12-08 DIAGNOSIS — M545 Low back pain: Secondary | ICD-10-CM | POA: Diagnosis not present

## 2017-12-08 DIAGNOSIS — M542 Cervicalgia: Secondary | ICD-10-CM | POA: Diagnosis not present

## 2017-12-08 DIAGNOSIS — M546 Pain in thoracic spine: Secondary | ICD-10-CM | POA: Diagnosis not present

## 2017-12-08 DIAGNOSIS — M7661 Achilles tendinitis, right leg: Secondary | ICD-10-CM | POA: Diagnosis not present

## 2017-12-08 DIAGNOSIS — G8929 Other chronic pain: Secondary | ICD-10-CM | POA: Diagnosis not present

## 2017-12-08 DIAGNOSIS — Z79899 Other long term (current) drug therapy: Secondary | ICD-10-CM | POA: Diagnosis not present

## 2017-12-08 MED ORDER — NITROGLYCERIN 0.2 MG/HR TD PT24: MEDICATED_PATCH | TRANSDERMAL | 1 refills | Status: DC

## 2017-12-08 NOTE — Progress Notes (Signed)
   HPI  CC: Right heel pain  Maria [name] is a 57 year old female who presents for right heel pain.  She states the pain started around 2 weeks ago.  She does not member any specific inciting event.  She denies any trauma to the area.  She states she does remember she had a similar issue many years ago.  She states the pain is made worse when she is driving.  She states the pain is made worse when she is walking.  She states she has been taking oxycodone 4 times daily, which she takes for low back pain chronically.  She also took [facility] powder which helped somewhat.  She denies any numbness tingling to foot.  She denies any weakness to the foot.  She states that is caused her to walk with a limp.  See HPI and/or previous note for associated ROS.  Objective: BP (!) 155/82   Ht 5\' 8"  (1.727 m)   Wt (!) 308 lb (139.7 kg)   LMP 04/11/2009   BMI 46.83 kg/m  Gen: Right-Hand Dominant. NAD, well groomed, a/o x3, normal affect.  CV: Well-perfused. Warm.  Resp: Non-labored.  Neuro: Sensation intact throughout. No gross coordination deficits.  Gait: Nonpathologic posture, mild right-sided limp on gait.  Right foot exam: No swelling, warmth, erythema noted.  Tenderness palpation at the insertion site of the Achilles at the calcaneus.  Full range of motion throughout testing.  Pain with dorsiflexion of the foot.  Pain with inversion of the foot.  5 out of 5 strength throughout testing.  Negative anterior drawer.  Negative Thompson test.  Assessment and plan:  Right Achilles tendinitis  ULTRASOUND: Achilles tendon, right Diagnostic ultrasound obtained of patient's right Achilles tendon.  - Achilles tendon diameter: 0.877 cm - Calcaneal insertion point: Visualized in long and short axis.  Osteophyte noted along the insertion site, with fluid noted beneath the insertion point.  Area of hypoechogenicity noted around the insertion point. - Achilles tendon midportion: Visualized in long and short axis.  There  is some evidence of thickening, without any evidence of tears or calcifications. - Bursa: There is evidence of retrocalcaneal bursal fluid.  IMPRESSION: findings consistent with Achilles tendinitis.  Abnormalities found, note neovessels:  "  Doppler showed no evidence of neovascularization  "   We discussed treatment options with Ms. Stephens at today's visit.  We will start her on a nitroglycerin patch protocol today.  We discussed this will take multiple months of treatment to see some results.  We also gave her some eccentric exercises today.  We discussed proper form during these exercises today.  We discussed heel lifts with the patient today.  She states she will think about starting some of these at her next visit.  We will follow-up in 6 weeks.   Lewanda Rife, MD [facility] 12/08/2017 10:21 AM

## 2017-12-08 NOTE — Patient Instructions (Addendum)
Nitroglycerin Protocol   Apply 1/4 nitroglycerin patch to affected area daily.  Change position of patch within the affected area every 24 hours.  You may experience a headache during the first 1-2 weeks of using the patch, these should subside.  If you experience headaches after beginning nitroglycerin patch treatment, you may take your preferred over the counter pain reliever.  Another side effect of the nitroglycerin patch is skin irritation or rash related to patch adhesive.  Please notify our office if you develop more severe headaches or rash, and stop the patch.  Tendon healing with nitroglycerin patch may require 12 to 24 weeks depending on the extent of injury.  Men should not use if taking Viagra, Cialis, or Levitra.   Do not use if you have migraines or rosacea.   Thank you for coming to see Maria [name] in clinic today.  You are diagnosed with a tendinitis of your Achilles tendon.  We will start her on the Nitropatch with instruction as noted above.  We provided you some exercises to perform at home before your next visit.  We will see back in 6 weeks for follow-up.

## 2017-12-14 ENCOUNTER — Other Ambulatory Visit: Payer: Self-pay

## 2017-12-14 MED ORDER — METRONIDAZOLE 500 MG PO TABS: 500.0000 mg | ORAL_TABLET | Freq: Two times a day (BID) | ORAL | 0 refills | Status: DC

## 2017-12-14 NOTE — Telephone Encounter (Signed)
Patient thinks she has BV and would like to have flagyl called into the pharmacy. I have advised her we will send in a refill but will need to be seen going forward.

## 2017-12-22 ENCOUNTER — Ambulatory Visit (INDEPENDENT_AMBULATORY_CARE_PROVIDER_SITE_OTHER): Payer: PPO | Admitting: Family Medicine

## 2017-12-22 VITALS — BP 137/90 | Ht 68.5 in | Wt 323.0 lb

## 2017-12-22 DIAGNOSIS — M7661 Achilles tendinitis, right leg: Secondary | ICD-10-CM

## 2017-12-22 DIAGNOSIS — Z96653 Presence of artificial knee joint, bilateral: Secondary | ICD-10-CM | POA: Diagnosis not present

## 2017-12-23 ENCOUNTER — Encounter: Payer: Self-pay | Admitting: Family Medicine

## 2017-12-23 DIAGNOSIS — M7661 Achilles tendinitis, right leg: Secondary | ICD-10-CM | POA: Insufficient documentation

## 2017-12-23 NOTE — Assessment & Plan Note (Signed)
Continue NTG patch Long discussion re injections and explanation of why that is nto option for achilles tendonitis Greater than 50% of our 35 minute office visit was spent in counseling and education regarding these issues. Added heel lift starting w 5/8 and transition in 2 weeks to 3/8. F/u 3-4 weeks. I would recommend nos tretching or strengthening exercises at this time; will start rehab once acute phase has passed.

## 2017-12-23 NOTE — Progress Notes (Signed)
    CHIEF COMPLAINT / HPI: 1. Right ankle / achilles tendon pain.No specific know trauma. Was seen on 8/30 and started on TG patch therapy which she says she has been doing. No significant improvement. She is frustrated and wants to know if she can "just get an injection" for it like she used to get for her knees.   2.She is s/p B TKR and doing well from tat perspective.  3.She has had some recent unwanted weight gain that she thinks is related to inactivity from her achilles pain and is frustrated by that.  REVIEW OF SYSTEMS: See HPI. Additional pertinent ROS is negative for fever, no (muscle belly) calf pain although the achilles on the right is still painful, no numbness on RLE. No abdominal pain.  PERTINENT  PMH / PSH: I have reviewed the patient's medications, allergies, past medical and surgical history, smoking status and updated in the EMR as appropriate.   OBJECTIVE:  vital signs reviewed.  GEN: WD WN overweight female in NAD KNEES: B full flexion and extension, nontender to palpation. Well healed surgical scars. ANKLE/ ACHILLES on right: mildly tender to palpation at insertion of achilles, no defect is noted on muscle or tendon. Calf is soft. FROM in ankle dorsiflexion/ plantarflexion. Some pain with resisted dorsiflexion.  SKIN: no unusual redness or warmth of right ankle, calf or achilles area VASC: dorsalis pedis and posterior tibialis pulses 2+ b symmetrical.  ASSESSMENT / PLAN:  S/P TKR (total knee replacement) Seems to be doing extremely well from this standpoint No knee pain  MORBID OBESITY She has gained some weight back from her popst surgical low but she remains approximately 100 pounds lighter than pre-gastric sleeve amd she was able to successfully get B TKR done.

## 2017-12-23 NOTE — Assessment & Plan Note (Signed)
She has gained some weight back from her popst surgical low but she remains approximately 100 pounds lighter than pre-gastric sleeve amd she was able to successfully get B TKR done.

## 2017-12-23 NOTE — Assessment & Plan Note (Signed)
Seems to be doing extremely well from this standpoint No knee pain

## 2018-01-02 ENCOUNTER — Other Ambulatory Visit: Payer: Self-pay | Admitting: Family Medicine

## 2018-01-08 ENCOUNTER — Encounter: Payer: Self-pay | Admitting: Radiology

## 2018-01-08 DIAGNOSIS — M546 Pain in thoracic spine: Secondary | ICD-10-CM | POA: Diagnosis not present

## 2018-01-08 DIAGNOSIS — Z79899 Other long term (current) drug therapy: Secondary | ICD-10-CM | POA: Diagnosis not present

## 2018-01-08 DIAGNOSIS — G8929 Other chronic pain: Secondary | ICD-10-CM | POA: Diagnosis not present

## 2018-01-08 DIAGNOSIS — M545 Low back pain: Secondary | ICD-10-CM | POA: Diagnosis not present

## 2018-01-08 DIAGNOSIS — M542 Cervicalgia: Secondary | ICD-10-CM | POA: Diagnosis not present

## 2018-01-12 DIAGNOSIS — M79671 Pain in right foot: Secondary | ICD-10-CM | POA: Diagnosis not present

## 2018-01-12 DIAGNOSIS — M2142 Flat foot [pes planus] (acquired), left foot: Secondary | ICD-10-CM | POA: Diagnosis not present

## 2018-01-12 DIAGNOSIS — M7661 Achilles tendinitis, right leg: Secondary | ICD-10-CM | POA: Diagnosis not present

## 2018-01-12 DIAGNOSIS — M25571 Pain in right ankle and joints of right foot: Secondary | ICD-10-CM | POA: Diagnosis not present

## 2018-01-12 DIAGNOSIS — M7731 Calcaneal spur, right foot: Secondary | ICD-10-CM | POA: Diagnosis not present

## 2018-01-12 DIAGNOSIS — E119 Type 2 diabetes mellitus without complications: Secondary | ICD-10-CM | POA: Diagnosis not present

## 2018-01-22 ENCOUNTER — Telehealth: Payer: Self-pay | Admitting: Radiology

## 2018-01-22 ENCOUNTER — Other Ambulatory Visit: Payer: Self-pay

## 2018-01-22 MED ORDER — METRONIDAZOLE 500 MG PO TABS: 500.0000 mg | ORAL_TABLET | Freq: Two times a day (BID) | ORAL | 0 refills | Status: DC

## 2018-01-22 NOTE — Telephone Encounter (Signed)
Patient is requesting a refill on Flagyl. Advised patient will called in one refill but will need to be seen for any additional refill.

## 2018-02-02 ENCOUNTER — Other Ambulatory Visit: Payer: Self-pay | Admitting: *Deleted

## 2018-02-02 MED ORDER — NITROGLYCERIN 0.2 MG/HR TD PT24: MEDICATED_PATCH | TRANSDERMAL | 1 refills | Status: DC

## 2018-02-05 DIAGNOSIS — Z79899 Other long term (current) drug therapy: Secondary | ICD-10-CM | POA: Diagnosis not present

## 2018-02-05 DIAGNOSIS — M546 Pain in thoracic spine: Secondary | ICD-10-CM | POA: Diagnosis not present

## 2018-02-05 DIAGNOSIS — M542 Cervicalgia: Secondary | ICD-10-CM | POA: Diagnosis not present

## 2018-02-05 DIAGNOSIS — Z23 Encounter for immunization: Secondary | ICD-10-CM | POA: Diagnosis not present

## 2018-02-05 DIAGNOSIS — M79671 Pain in right foot: Secondary | ICD-10-CM | POA: Diagnosis not present

## 2018-02-14 DIAGNOSIS — R42 Dizziness and giddiness: Secondary | ICD-10-CM | POA: Diagnosis not present

## 2018-02-14 DIAGNOSIS — I10 Essential (primary) hypertension: Secondary | ICD-10-CM | POA: Diagnosis not present

## 2018-02-14 DIAGNOSIS — R51 Headache: Secondary | ICD-10-CM | POA: Diagnosis not present

## 2018-02-14 DIAGNOSIS — R0602 Shortness of breath: Secondary | ICD-10-CM | POA: Diagnosis not present

## 2018-02-14 DIAGNOSIS — E119 Type 2 diabetes mellitus without complications: Secondary | ICD-10-CM | POA: Diagnosis not present

## 2018-02-14 DIAGNOSIS — D539 Nutritional anemia, unspecified: Secondary | ICD-10-CM | POA: Diagnosis not present

## 2018-02-14 DIAGNOSIS — J018 Other acute sinusitis: Secondary | ICD-10-CM | POA: Diagnosis not present

## 2018-03-05 DIAGNOSIS — Z79899 Other long term (current) drug therapy: Secondary | ICD-10-CM | POA: Diagnosis not present

## 2018-03-05 DIAGNOSIS — Z131 Encounter for screening for diabetes mellitus: Secondary | ICD-10-CM | POA: Diagnosis not present

## 2018-03-05 DIAGNOSIS — R0602 Shortness of breath: Secondary | ICD-10-CM | POA: Diagnosis not present

## 2018-03-05 DIAGNOSIS — E559 Vitamin D deficiency, unspecified: Secondary | ICD-10-CM | POA: Diagnosis not present

## 2018-03-05 DIAGNOSIS — M542 Cervicalgia: Secondary | ICD-10-CM | POA: Diagnosis not present

## 2018-03-05 DIAGNOSIS — Z13220 Encounter for screening for lipoid disorders: Secondary | ICD-10-CM | POA: Diagnosis not present

## 2018-03-05 DIAGNOSIS — Z114 Encounter for screening for human immunodeficiency virus [HIV]: Secondary | ICD-10-CM | POA: Diagnosis not present

## 2018-03-05 DIAGNOSIS — Z0000 Encounter for general adult medical examination without abnormal findings: Secondary | ICD-10-CM | POA: Diagnosis not present

## 2018-03-05 DIAGNOSIS — M546 Pain in thoracic spine: Secondary | ICD-10-CM | POA: Diagnosis not present

## 2018-03-05 DIAGNOSIS — M79671 Pain in right foot: Secondary | ICD-10-CM | POA: Diagnosis not present

## 2018-03-19 ENCOUNTER — Emergency Department: Payer: PPO

## 2018-03-19 ENCOUNTER — Other Ambulatory Visit: Payer: Self-pay

## 2018-03-19 ENCOUNTER — Encounter
Admission: EM | Disposition: A | Discharge status: Home / Self Care | Payer: Self-pay | Source: Home / Self Care | Attending: Internal Medicine

## 2018-03-19 ENCOUNTER — Inpatient Hospital Stay
Admission: EM | Admit: 2018-03-19 | Discharge: 2018-03-22 | DRG: 163 | Disposition: A | Discharge status: Home / Self Care | Payer: PPO | Attending: Internal Medicine | Admitting: Internal Medicine

## 2018-03-19 DIAGNOSIS — Z86711 Personal history of pulmonary embolism: Secondary | ICD-10-CM

## 2018-03-19 DIAGNOSIS — I10 Essential (primary) hypertension: Secondary | ICD-10-CM | POA: Diagnosis present

## 2018-03-19 DIAGNOSIS — E119 Type 2 diabetes mellitus without complications: Secondary | ICD-10-CM | POA: Diagnosis not present

## 2018-03-19 DIAGNOSIS — Z79899 Other long term (current) drug therapy: Secondary | ICD-10-CM | POA: Diagnosis not present

## 2018-03-19 DIAGNOSIS — I517 Cardiomegaly: Secondary | ICD-10-CM | POA: Diagnosis not present

## 2018-03-19 DIAGNOSIS — Z6841 Body Mass Index (BMI) 40.0 and over, adult: Secondary | ICD-10-CM | POA: Diagnosis not present

## 2018-03-19 DIAGNOSIS — K219 Gastro-esophageal reflux disease without esophagitis: Secondary | ICD-10-CM | POA: Diagnosis not present

## 2018-03-19 DIAGNOSIS — R0789 Other chest pain: Secondary | ICD-10-CM | POA: Diagnosis not present

## 2018-03-19 DIAGNOSIS — E785 Hyperlipidemia, unspecified: Secondary | ICD-10-CM | POA: Diagnosis present

## 2018-03-19 DIAGNOSIS — Z9884 Bariatric surgery status: Secondary | ICD-10-CM | POA: Diagnosis not present

## 2018-03-19 DIAGNOSIS — I2609 Other pulmonary embolism with acute cor pulmonale: Secondary | ICD-10-CM | POA: Diagnosis not present

## 2018-03-19 DIAGNOSIS — Z96653 Presence of artificial knee joint, bilateral: Secondary | ICD-10-CM | POA: Diagnosis not present

## 2018-03-19 DIAGNOSIS — J9601 Acute respiratory failure with hypoxia: Secondary | ICD-10-CM | POA: Diagnosis not present

## 2018-03-19 DIAGNOSIS — R0602 Shortness of breath: Secondary | ICD-10-CM | POA: Diagnosis not present

## 2018-03-19 DIAGNOSIS — R570 Cardiogenic shock: Secondary | ICD-10-CM | POA: Diagnosis not present

## 2018-03-19 DIAGNOSIS — E1165 Type 2 diabetes mellitus with hyperglycemia: Secondary | ICD-10-CM | POA: Diagnosis present

## 2018-03-19 DIAGNOSIS — Z7984 Long term (current) use of oral hypoglycemic drugs: Secondary | ICD-10-CM

## 2018-03-19 DIAGNOSIS — M542 Cervicalgia: Secondary | ICD-10-CM | POA: Diagnosis not present

## 2018-03-19 DIAGNOSIS — I248 Other forms of acute ischemic heart disease: Secondary | ICD-10-CM | POA: Diagnosis present

## 2018-03-19 DIAGNOSIS — R0902 Hypoxemia: Secondary | ICD-10-CM | POA: Diagnosis not present

## 2018-03-19 DIAGNOSIS — R0689 Other abnormalities of breathing: Secondary | ICD-10-CM | POA: Diagnosis not present

## 2018-03-19 DIAGNOSIS — Z885 Allergy status to narcotic agent status: Secondary | ICD-10-CM

## 2018-03-19 DIAGNOSIS — R079 Chest pain, unspecified: Secondary | ICD-10-CM | POA: Diagnosis not present

## 2018-03-19 DIAGNOSIS — Z09 Encounter for follow-up examination after completed treatment for conditions other than malignant neoplasm: Secondary | ICD-10-CM | POA: Diagnosis not present

## 2018-03-19 DIAGNOSIS — R7989 Other specified abnormal findings of blood chemistry: Secondary | ICD-10-CM | POA: Diagnosis not present

## 2018-03-19 DIAGNOSIS — R42 Dizziness and giddiness: Secondary | ICD-10-CM | POA: Diagnosis present

## 2018-03-19 DIAGNOSIS — I2699 Other pulmonary embolism without acute cor pulmonale: Secondary | ICD-10-CM | POA: Diagnosis not present

## 2018-03-19 DIAGNOSIS — R069 Unspecified abnormalities of breathing: Secondary | ICD-10-CM | POA: Diagnosis not present

## 2018-03-19 DIAGNOSIS — Z9889 Other specified postprocedural states: Secondary | ICD-10-CM | POA: Diagnosis not present

## 2018-03-19 HISTORY — PX: PULMONARY THROMBECTOMY: CATH118295

## 2018-03-19 HISTORY — DX: Personal history of pulmonary embolism: Z86.711

## 2018-03-19 LAB — GLUCOSE, CAPILLARY
Glucose-Capillary: 407 mg/dL — ABNORMAL HIGH (ref 70–99)
Glucose-Capillary: 440 mg/dL — ABNORMAL HIGH (ref 70–99)
Glucose-Capillary: 405 mg/dL — ABNORMAL HIGH (ref 70–99)
Glucose-Capillary: 335 mg/dL — ABNORMAL HIGH (ref 70–99)

## 2018-03-19 LAB — BASIC METABOLIC PANEL
Anion gap: 14 (ref 5–15)
BUN: 20 mg/dL (ref 6–20)
CO2: 22 mmol/L (ref 22–32)
Calcium: 8.6 mg/dL — ABNORMAL LOW (ref 8.9–10.3)
Chloride: 101 mmol/L (ref 98–111)
Creatinine, Ser: 1.18 mg/dL — ABNORMAL HIGH (ref 0.44–1.00)
GFR calc Af Amer: 59 mL/min — ABNORMAL LOW (ref >60)
GFR calc non Af Amer: 51 mL/min — ABNORMAL LOW (ref >60)
Glucose, Bld: 482 mg/dL — ABNORMAL HIGH (ref 70–99)
Potassium: 3.7 mmol/L (ref 3.5–5.1)
Sodium: 137 mmol/L (ref 135–145)

## 2018-03-19 LAB — CBC WITH DIFFERENTIAL/PLATELET
Abs Immature Granulocytes: 0.1 10*3/uL — ABNORMAL HIGH (ref 0.00–0.07)
Basophils Absolute: 0.1 10*3/uL (ref 0.0–0.1)
Basophils Relative: 0 %
Eosinophils Absolute: 0.1 10*3/uL (ref 0.0–0.5)
Eosinophils Relative: 1 %
HCT: 41.8 % (ref 36.0–46.0)
Hemoglobin: 13 g/dL (ref 12.0–15.0)
Immature Granulocytes: 1 %
Lymphocytes Relative: 38 %
Lymphs Abs: 6 10*3/uL — ABNORMAL HIGH (ref 0.7–4.0)
MCH: 28.1 pg (ref 26.0–34.0)
MCHC: 31.1 g/dL (ref 30.0–36.0)
MCV: 90.5 fL (ref 80.0–100.0)
Monocytes Absolute: 0.7 10*3/uL (ref 0.1–1.0)
Monocytes Relative: 5 %
Neutro Abs: 9 10*3/uL — ABNORMAL HIGH (ref 1.7–7.7)
Neutrophils Relative %: 55 %
Platelets: 199 10*3/uL (ref 150–400)
RBC: 4.62 MIL/uL (ref 3.87–5.11)
RDW: 13.7 % (ref 11.5–15.5)
WBC: 16 10*3/uL — ABNORMAL HIGH (ref 4.0–10.5)
nRBC: 0 % (ref 0.0–0.2)

## 2018-03-19 LAB — TROPONIN I
Troponin I: 0.09 ng/mL (ref <0.03)
Troponin I: 0.76 ng/mL (ref <0.03)

## 2018-03-19 LAB — MRSA PCR SCREENING: MRSA by PCR: NEGATIVE

## 2018-03-19 LAB — PROTIME-INR
INR: 1.12
Prothrombin Time: 14.3 seconds (ref 11.4–15.2)

## 2018-03-19 LAB — APTT
aPTT: 117 seconds — ABNORMAL HIGH (ref 24–36)
aPTT: 41 seconds — ABNORMAL HIGH (ref 24–36)

## 2018-03-19 SURGERY — PULMONARY THROMBECTOMY
Anesthesia: Moderate Sedation

## 2018-03-19 SURGERY — PULMONARY THROMBECTOMY
Anesthesia: Moderate Sedation | Laterality: Bilateral

## 2018-03-19 MED ORDER — HEPARIN SODIUM (PORCINE) 5000 UNIT/ML IJ SOLN: 5000.0000 [IU] | Freq: Three times a day (TID) | INTRAMUSCULAR | Status: DC

## 2018-03-19 MED ORDER — INSULIN ASPART 100 UNIT/ML ~~LOC~~ SOLN
0.0000 [IU] | Freq: Three times a day (TID) | SUBCUTANEOUS | Status: DC
  Administered 2018-03-19: 12 [IU] via SUBCUTANEOUS
  Filled 2018-03-19: qty 1

## 2018-03-19 MED ORDER — MIDAZOLAM HCL 5 MG/5ML IJ SOLN
INTRAMUSCULAR | Status: AC
  Filled 2018-03-19: qty 5

## 2018-03-19 MED ORDER — ONDANSETRON HCL 4 MG/2ML IJ SOLN: 4.0000 mg | Freq: Four times a day (QID) | INTRAMUSCULAR | Status: DC | PRN

## 2018-03-19 MED ORDER — FENTANYL CITRATE (PF) 100 MCG/2ML IJ SOLN
INTRAMUSCULAR | Status: DC | PRN
  Administered 2018-03-19: 50 ug via INTRAVENOUS

## 2018-03-19 MED ORDER — ALTEPLASE 2 MG IJ SOLR
INTRAMUSCULAR | Status: DC | PRN
  Administered 2018-03-19 (×2): 4 mg

## 2018-03-19 MED ORDER — FENTANYL CITRATE (PF) 100 MCG/2ML IJ SOLN
INTRAMUSCULAR | Status: AC
  Filled 2018-03-19: qty 4

## 2018-03-19 MED ORDER — MORPHINE SULFATE (PF) 2 MG/ML IV SOLN
1.0000 mg | INTRAVENOUS | Status: DC | PRN
  Administered 2018-03-19: 1 mg via INTRAVENOUS
  Administered 2018-03-20 (×2): 2 mg via INTRAVENOUS
  Filled 2018-03-19 (×3): qty 1

## 2018-03-19 MED ORDER — METOPROLOL SUCCINATE ER 50 MG PO TB24: 200.0000 mg | ORAL_TABLET | Freq: Every day | ORAL | Status: DC

## 2018-03-19 MED ORDER — INSULIN ASPART 100 UNIT/ML ~~LOC~~ SOLN
0.0000 [IU] | SUBCUTANEOUS | Status: DC
  Administered 2018-03-19: 11 [IU] via SUBCUTANEOUS
  Administered 2018-03-20 (×2): 3 [IU] via SUBCUTANEOUS
  Administered 2018-03-20: 8 [IU] via SUBCUTANEOUS
  Administered 2018-03-20: 5 [IU] via SUBCUTANEOUS
  Administered 2018-03-20 (×2): 3 [IU] via SUBCUTANEOUS
  Administered 2018-03-21 (×3): 2 [IU] via SUBCUTANEOUS
  Administered 2018-03-21 (×2): 3 [IU] via SUBCUTANEOUS
  Filled 2018-03-19 (×12): qty 1

## 2018-03-19 MED ORDER — ATORVASTATIN CALCIUM 20 MG PO TABS
20.0000 mg | ORAL_TABLET | Freq: Every day | ORAL | Status: DC
  Administered 2018-03-20 – 2018-03-21 (×2): 20 mg via ORAL
  Filled 2018-03-19 (×2): qty 1

## 2018-03-19 MED ORDER — HEPARIN BOLUS VIA INFUSION
5000.0000 [IU] | Freq: Once | INTRAVENOUS | Status: AC
  Administered 2018-03-19: 5000 [IU] via INTRAVENOUS
  Filled 2018-03-19: qty 5000

## 2018-03-19 MED ORDER — IOPAMIDOL (ISOVUE-300) INJECTION 61%
INTRAVENOUS | Status: DC | PRN
  Administered 2018-03-19: 25 mL via INTRAVENOUS

## 2018-03-19 MED ORDER — HEPARIN (PORCINE) 25000 UT/250ML-% IV SOLN
1350.0000 [IU]/h | INTRAVENOUS | Status: DC
  Administered 2018-03-19: 1700 [IU]/h via INTRAVENOUS
  Administered 2018-03-20 – 2018-03-21 (×2): 1350 [IU]/h via INTRAVENOUS
  Filled 2018-03-19 (×4): qty 250

## 2018-03-19 MED ORDER — MIDAZOLAM HCL 2 MG/2ML IJ SOLN
INTRAMUSCULAR | Status: DC | PRN
  Administered 2018-03-19: 2 mg via INTRAVENOUS

## 2018-03-19 MED ORDER — ONDANSETRON HCL 4 MG PO TABS
4.0000 mg | ORAL_TABLET | Freq: Four times a day (QID) | ORAL | Status: DC | PRN
  Filled 2018-03-19: qty 1

## 2018-03-19 MED ORDER — INSULIN GLARGINE 100 UNIT/ML ~~LOC~~ SOLN
10.0000 [IU] | Freq: Every day | SUBCUTANEOUS | Status: DC
  Administered 2018-03-19 – 2018-03-21 (×3): 10 [IU] via SUBCUTANEOUS
  Filled 2018-03-19 (×5): qty 0.1

## 2018-03-19 MED ORDER — HEPARIN SODIUM (PORCINE) 1000 UNIT/ML IJ SOLN
INTRAMUSCULAR | Status: AC
  Filled 2018-03-19: qty 1

## 2018-03-19 MED ORDER — IOHEXOL 350 MG/ML SOLN
75.0000 mL | Freq: Once | INTRAVENOUS | Status: AC | PRN
  Administered 2018-03-19: 75 mL via INTRAVENOUS

## 2018-03-19 MED ORDER — DOCUSATE SODIUM 100 MG PO CAPS
100.0000 mg | ORAL_CAPSULE | Freq: Two times a day (BID) | ORAL | Status: DC
  Administered 2018-03-20 – 2018-03-22 (×4): 100 mg via ORAL
  Filled 2018-03-19 (×5): qty 1

## 2018-03-19 MED ORDER — BENAZEPRIL HCL 20 MG PO TABS
40.0000 mg | ORAL_TABLET | Freq: Every day | ORAL | Status: DC
  Filled 2018-03-19 (×3): qty 2

## 2018-03-19 MED ORDER — TRAZODONE HCL 50 MG PO TABS
25.0000 mg | ORAL_TABLET | Freq: Every evening | ORAL | Status: DC | PRN
  Administered 2018-03-21: 25 mg via ORAL
  Filled 2018-03-19: qty 1

## 2018-03-19 MED ORDER — DOPAMINE-DEXTROSE 3.2-5 MG/ML-% IV SOLN
0.0000 ug/kg/min | INTRAVENOUS | Status: DC
  Administered 2018-03-19: 5 ug/kg/min via INTRAVENOUS
  Administered 2018-03-20: 2 ug/kg/min via INTRAVENOUS
  Filled 2018-03-19 (×2): qty 250

## 2018-03-19 MED ORDER — ALTEPLASE 2 MG IJ SOLR
INTRAMUSCULAR | Status: AC
  Filled 2018-03-19: qty 8

## 2018-03-19 MED ORDER — CEFAZOLIN SODIUM-DEXTROSE 2-4 GM/100ML-% IV SOLN
2.0000 g | INTRAVENOUS | Status: AC
  Administered 2018-03-19: 2 g via INTRAVENOUS
  Filled 2018-03-19: qty 100

## 2018-03-19 MED ORDER — BISACODYL 5 MG PO TBEC
5.0000 mg | DELAYED_RELEASE_TABLET | Freq: Every day | ORAL | Status: DC | PRN
  Administered 2018-03-21: 5 mg via ORAL
  Filled 2018-03-19: qty 1

## 2018-03-19 MED ORDER — MIDAZOLAM HCL 2 MG/2ML IJ SOLN
INTRAMUSCULAR | Status: AC
  Filled 2018-03-19: qty 4

## 2018-03-19 MED ORDER — PANTOPRAZOLE SODIUM 40 MG PO TBEC
40.0000 mg | DELAYED_RELEASE_TABLET | Freq: Every day | ORAL | Status: DC
  Administered 2018-03-20 – 2018-03-22 (×3): 40 mg via ORAL
  Filled 2018-03-19 (×3): qty 1

## 2018-03-19 MED ORDER — SODIUM CHLORIDE 0.9 % IV BOLUS
1000.0000 mL | Freq: Once | INTRAVENOUS | Status: AC
  Administered 2018-03-19: 1000 mL via INTRAVENOUS

## 2018-03-19 MED ORDER — FENTANYL CITRATE (PF) 100 MCG/2ML IJ SOLN
INTRAMUSCULAR | Status: AC
  Filled 2018-03-19: qty 2

## 2018-03-19 MED ORDER — SODIUM CHLORIDE 0.9 % IV SOLN
INTRAVENOUS | Status: DC
  Administered 2018-03-19: 13:00:00 via INTRAVENOUS

## 2018-03-19 SURGICAL SUPPLY — 14 items
CANISTER PENUMBRA ENGINE (MISCELLANEOUS) ×1 IMPLANT
CATH BEACON 5 .035 40 KMP TP (CATHETERS) IMPLANT
CATH BEACON 5 .035 65 RIM TIP (CATHETERS) ×1 IMPLANT
CATH BEACON 5 .038 40 KMP TP (CATHETERS) ×1
CATH INDIGO D 50CM (CATHETERS) ×1 IMPLANT
GLIDEWIRE ADV .035X180CM (WIRE) ×1 IMPLANT
KIT CATH CVC 3 LUMEN 7FR 8IN (MISCELLANEOUS) ×1 IMPLANT
PACK ANGIOGRAPHY (CUSTOM PROCEDURE TRAY) ×2 IMPLANT
SHEATH BRITE TIP 6FRX11 (SHEATH) ×1 IMPLANT
SHEATH BRITE TIP 8FRX11 (SHEATH) ×1 IMPLANT
SYR MEDRAD MARK V 150ML (SYRINGE) ×1 IMPLANT
TUBING CONTRAST HIGH PRESS 72 (TUBING) ×2 IMPLANT
WIRE G 018X200 V18 (WIRE) ×1 IMPLANT
WIRE MAGIC TOR.035 180C (WIRE) ×1 IMPLANT

## 2018-03-19 NOTE — Progress Notes (Signed)
Min pressure increase per MD for pt desat

## 2018-03-19 NOTE — ED Notes (Signed)
Respiratory in room to switch pt to bipap

## 2018-03-19 NOTE — ED Triage Notes (Signed)
Pt comes from home after having chest pain about 9am. Missed metrolpolo dose yesterday so took it when CP started. She was also dizzy and SOB. O2 was in 60s with EMS then Oxygen 10L brought it to 94%. 120 HR. No CP now. CBG 327 hx of diabetes.

## 2018-03-19 NOTE — H&P (Signed)
[facility] NAME: Maria [name]    MR#:  [id number]  DATE OF BIRTH:  Feb 26, 1961  DATE OF ADMISSION:  03/19/2018  PRIMARY CARE PHYSICIAN: Elenore Paddy, FNP   REQUESTING/REFERRING PHYSICIAN: Dr. Kelli Hope  CHIEF COMPLAINT: Chest pain   Chief Complaint  Patient presents with  . Chest Pain    HISTORY OF PRESENT ILLNESS:  Maria [name]  is a 57 y.o. female with a known history of diabetes mellitus type 2, anemia, hypertension, obesity comes in because of chest pain.  Patient was in her usual state health this morning.  While walking to the kitchen she felt dizzy and was about to pass out and developed chest pain felt like because severe substernal chest pressure, she called 911.  Noted to have severe hypoxia with O2 sat 60%, transported to ER with 15 L of oxygen.  Patient reported her chest pain, dizziness decreased after they gave oxygen.  Patient noted to have submassive PE with right heart strain.  Patient had thrombolysis of pulmonary arteries by Dr. due and mechanical thrombectomy of bilateral main and lower lobe pulmonary arteries, patient still on 100% BiPAP and seen in the ICU.  She does not have chest pain anymore but still hypoxic saturation hovering between 88 to 90% on 100% BiPAP.  She is mentating well, tachycardic with heart rate up to 100 bpm.  PAST MEDICAL HISTORY:   Past Medical History:  Diagnosis Date  . Allergic rhinitis   . Anemia    1980's  . Anxiety   . Carpal tunnel syndrome, left    pt denies  . Depression   . Diabetes mellitus    has not taken medications since 07/2016 after bariatric surgery  . DUB (dysfunctional uterine bleeding)   . Dysrhythmia    tachycardic  . External hemorrhoid   . GERD (gastroesophageal reflux disease)   . Gout   . History of hiatal hernia   . History of kidney stones   . Hypertension   . Morbid obesity ([place])   . Obesity   . Osteoarthritis, knee   . Pneumonia   .  Recurrent UTI   . Rotator cuff syndrome of left shoulder     PAST SURGICAL HISTOIRY:   Past Surgical History:  Procedure Laterality Date  . CHOLECYSTECTOMY    . INCISION AND DRAINAGE ABSCESS N/A 06/01/2014   Procedure: INCISION AND DRAINAGE ABSCESS;  Surgeon: Rolm Bookbinder, MD;  Location: [facility];  Service: General;  Laterality: N/A;  . IR GENERIC HISTORICAL  05/18/2016   IR EPIDUROGRAPHY 05/18/2016 [place] Morelle, MD [facility]  . JOINT REPLACEMENT    . LAPAROSCOPIC GASTRIC SLEEVE RESECTION WITH HIATAL HERNIA REPAIR N/A 07/26/2016   Procedure: LAPAROSCOPIC GASTRIC SLEEVE RESECTION WITH HIATAL HERNIA REPAIR, UPPER ENDOSCOPY;  Surgeon: Johnathan Hausen, MD;  Location: [facility];  Service: General;  Laterality: N/A;  . LITHOTRIPSY    . REPLACEMENT TOTAL KNEE BILATERAL    . TUBAL LIGATION      SOCIAL HISTORY:   Social History   Tobacco Use  . Smoking status: Never Smoker  . Smokeless tobacco: Never Used  Substance Use Topics  . Alcohol use: No    FAMILY HISTORY:   Family History  Problem Relation Age of Onset  . Coronary artery disease Unknown   . Hypertension Unknown   . Diabetes Unknown   . Obesity Unknown   . Diabetes Mother   . Hypertension Mother   . Congestive Heart Failure  Mother     DRUG ALLERGIES:   Allergies  Allergen Reactions  . Propoxyphene N-Acetaminophen Palpitations    REVIEW OF SYSTEMS:  CONSTITUTIONAL: Dizziness, near syncope this morning. EYES: No blurred or double vision.  EARS, NOSE, AND THROAT: No tinnitus or ear pain.  RESPIRATORY: Hypoxic this morning CARDIOVASCULAR: No chest pain, orthopnea, edema.  GASTROINTESTINAL: No nausea, vomiting, diarrhea or abdominal pain.  GENITOURINARY: No dysuria, hematuria.  ENDOCRINE: No polyuria, nocturia,  HEMATOLOGY: No anemia, easy bruising or bleeding SKIN: No rash or lesion. MUSCULOSKELETAL: No joint pain or arthritis.   NEUROLOGIC: No tingling, numbness, weakness.  PSYCHIATRY: No anxiety or  depression.   MEDICATIONS AT HOME:   Prior to Admission medications   Medication Sig Start Date End Date Taking? Authorizing Provider  atorvastatin (LIPITOR) 20 MG tablet Take 20 mg by mouth at bedtime. 02/12/18  Yes [provider]  benazepril (LOTENSIN) 40 MG tablet Take 40 mg by mouth daily.  07/06/16  Yes [provider]  Calcium-Vitamin D-Vitamin K (CALCIUM + D) [id number] MG-UNT-MCG CHEW Chew 2 each by mouth daily.   Yes [provider]  Colchicine 0.6 MG CAPS Take 0.6 mg by mouth daily. 02/01/18  Yes [provider]  furosemide (LASIX) 20 MG tablet Take 20 mg by mouth daily.  12/28/15  Yes [provider]  glipiZIDE (GLUCOTROL) 5 MG tablet Take 5 mg by mouth daily.  07/13/17  Yes [provider]  LANTUS SOLOSTAR 100 UNIT/ML Solostar Pen Inject 10 Units into the skin at bedtime.  07/19/17  Yes [provider]  metFORMIN (GLUCOPHAGE) 850 MG tablet Take 850 mg by mouth 2 (two) times daily.  07/13/17  Yes [provider]  metoprolol (TOPROL-XL) 200 MG 24 hr tablet Take 200 mg by mouth daily.    Yes [provider]  Multiple Vitamins-Minerals (MULTIVITAMIN WITH MINERALS) tablet Take 1 tablet by mouth daily.   Yes [provider]  nitroGLYCERIN (NITRODUR - DOSED IN MG/24 HR) 0.2 mg/hr patch Use 1/4 patch daily to the affected area. 02/02/18  Yes Draper, Christia Reading R, DO  oxyCODONE (ROXICODONE) 15 MG immediate release tablet Take 15 mg by mouth every 6 (six) hours as needed for pain.   Yes [provider]  pantoprazole (PROTONIX) 40 MG tablet Take 40 mg by mouth daily.    Yes [provider]  fluconazole (DIFLUCAN) 150 MG tablet TAKE ONE TABLET BY MOUTH FOR ONE DOSE 01/02/18   Donnamae Jude, MD  metroNIDAZOLE (FLAGYL) 500 MG tablet Take 1 tablet (500 mg total) by mouth 2 (two) times daily. 12/14/17   Anyanwu, Sallyanne Havers, MD  metroNIDAZOLE (FLAGYL) 500 MG tablet Take 1 tablet (500 mg total) by mouth 2  (two) times daily. 01/22/18   Anyanwu, Sallyanne Havers, MD      VITAL SIGNS:  Blood pressure 116/90, pulse (!) 104, resp. rate (!) 26, height 5\' 5"  (1.651 m), weight (!) 154.2 kg, last menstrual period 04/11/2009, SpO2 (!) 88 %.  PHYSICAL EXAMINATION:  GENERAL:  57 y.o.-year-old patient lying in the bed with BiPAP mask on. EYES: Pupils equal, round, reactive to light and accommodation. No scleral icterus. Extraocular muscles intact.  HEENT: Head atraumatic, normocephalic. Oropharynx and nasopharynx clear.  NECK:  Supple, no jugular venous distention. No thyroid enlargement, no tenderness.  LUNGS: Diminished air entry bilaterally.  CARDIOVASCULAR: S1, S2 tachycardic. No murmurs, rubs, or gallops.  ABDOMEN: Soft, nontender, nondistended. Bowel sounds present. No organomegaly or mass.  EXTREMITIES: No pedal edema, cyanosis, or clubbing.  NEUROLOGIC: Cranial nerves II through XII are intact. Muscle strength 5/5 in all extremities. Sensation intact. Gait not checked.  PSYCHIATRIC: The patient is alert and oriented x 3.  SKIN: No obvious rash, lesion, or ulcer.   LABORATORY PANEL:   CBC Recent Labs  Lab 03/19/18 1135  WBC 16.0*  HGB 13.0  HCT 41.8  PLT 199   ------------------------------------------------------------------------------------------------------------------  Chemistries  Recent Labs  Lab 03/19/18 1135  NA 137  K 3.7  CL 101  CO2 22  GLUCOSE 482*  BUN 20  CREATININE 1.18*  CALCIUM 8.6*   ------------------------------------------------------------------------------------------------------------------  Cardiac Enzymes Recent Labs  Lab 03/19/18 1135  TROPONINI 0.09*   ------------------------------------------------------------------------------------------------------------------  RADIOLOGY:  Ct Angio Chest Pe W And/or Wo Contrast  Result Date: 03/19/2018 CLINICAL DATA:  Chest pain since 9 a.m. this morning. EXAM: CT ANGIOGRAPHY CHEST WITH CONTRAST TECHNIQUE:  Multidetector CT imaging of the chest was performed using the standard protocol during bolus administration of intravenous contrast. Multiplanar CT image reconstructions and MIPs were obtained to evaluate the vascular anatomy. CONTRAST:  75 mL OMNIPAQUE IOHEXOL 350 MG/ML SOLN COMPARISON:  Single-view of the chest 03/19/2018. FINDINGS: Cardiovascular: The patient has extensive bilateral pulmonary emboli. Clot is seen interlobar branches bilaterally, more blunted on the right. Right ventricular diameter is 5.6 cm and left ventricular diameter is 2.2 cm for and RV to LV ratio of 2.5 consistent with right heart strain. The interventricular septum is bowed to the left. Heart size is enlarged. No pericardial effusion. No atherosclerosis. Mediastinum/Nodes: No lymphadenopathy. 1.2 cm low attenuating lesion right lobe of the thyroid incidentally noted. The esophagus appears normal. Cardiomegaly. Lungs/Pleura: No pleural effusion.  The lungs are clear. Upper Abdomen: No acute abnormality. The liver is somewhat low attenuating consistent with fatty infiltration. Musculoskeletal: No acute or focal abnormality Review of the MIP images confirms the above findings. IMPRESSION: Positive for acute PE with CT evidence of right heart strain (RV/LV Ratio = 2.5) consistent with at least submassive (intermediate risk) PE. The presence of right heart strain has been associated with an increased risk of morbidity and mortality. Please activate Code PE by paging [phone]. Small hiatal hernia. Cardiomegaly. Critical Value/emergent results were called by telephone at the time of interpretation on 03/19/2018 at 12:05 pm to Dr. Quentin Cornwall, who verbally acknowledged these results. Electronically Signed   By: Inge Rise M.D.   On: 03/19/2018 12:08   Dg Chest Portable 1 View  Result Date: 03/19/2018 CLINICAL DATA:  Chest pain.  Hypoxia. EXAM: PORTABLE CHEST 1 VIEW COMPARISON:  03/15/2017 FINDINGS: The heart size and mediastinal  contours are within normal limits. Both lungs are clear. The visualized skeletal structures are unremarkable. IMPRESSION: No active disease. Electronically Signed   By: Lorriane Shire M.D.   On: 03/19/2018 11:39    EKG:   Orders placed or performed during the hospital encounter of 03/19/18  . ED EKG  . ED EKG  . EKG 12-Lead  . EKG 12-Lead  . EKG 12-Lead  . EKG 12-Lead   Sinus tachycardia with 118 bpm.  Normal axis, Q waves in inferior, anterior leads with no ST elevations or depressions.   IMPRESSION AND PLAN:   57 year old female patient with history of diabetes mellitus type 2, hypertension, obesity comes in with sudden onset of chest pain, dizziness, noted to have severe hypoxia and now has submassive pulmonary embolus with acute respiratory failure.  #1 acute respiratory failure with hypoxia due to submassive PE with right heart strain: Status post mechanical thrombectomy, TPA  by Dr. Lucky Cowboy  from vascular. Patient seen in intensive care unit, still on 100% BiPAP, continue BiPAP, restart heparin drip, received intra-arterial TPA by vascular.  So restart heparin drip when appropriate, continue BiPAP, monitor in the intensive care unit closely.  Patient is still critical.  Discussed with patient's sister.  High risk for intubation. 2.  Right heart strain, sinus tachycardia: Due to PE:  3.  Diabetes mellitus type II: Hold metformin, glipizide, Lantus, continue sliding scale insulin with coverage.  And n.p.o. at this time because she is still needing her present BiPAP.     All the records are reviewed and case discussed with ED provider. Management plans discussed with the patient, family and they are in agreement.  CODE STATUS: Full code  TOTAL TIME TAKING CARE OF THIS PATIENT: 55 minutes.(:  Critical care time)   Epifanio Lesches M.D on 03/19/2018 at 5:26 PM  Between 7am to 6pm - Pager - [phone]  After 6pm go to [email or web address] - password [email or web address] [facility]  Office  [phone]  CC: Primary care physician; Elenore Paddy, FNP  Note: This dictation was prepared with Dragon dictation along with smaller phrase technology. Any transcriptional errors that result from this process are unintentional.

## 2018-03-19 NOTE — Progress Notes (Signed)
ANTICOAGULATION CONSULT NOTE - Initial Consult  Pharmacy Consult for Heparin Indication: pulmonary embolus  Allergies  Allergen Reactions  . Propoxyphene N-Acetaminophen Palpitations    Patient Measurements: Height: 5\' 5"  (165.1 cm) Weight: (!) 340 lb (154.2 kg) IBW/kg (Calculated) : 57 Heparin Dosing Weight: 96.135 kg  Vital Signs: BP: 116/90 (12/09 1300) Pulse Rate: 104 (12/09 1600)  Labs: Recent Labs    03/19/18 1135 03/19/18 1426 03/19/18 1637  HGB 13.0  --   --   HCT 41.8  --   --   PLT 199  --   --   APTT  --  117* 41*  LABPROT  --  14.3  --   INR  --  1.12  --   CREATININE 1.18*  --   --   TROPONINI 0.09*  --   --     Estimated Creatinine Clearance: 78.7 mL/min (A) (by C-G formula based on SCr of 1.18 mg/dL (H)).   Medical History: Past Medical History:  Diagnosis Date  . Allergic rhinitis   . Anemia    1980's  . Anxiety   . Carpal tunnel syndrome, left    pt denies  . Depression   . Diabetes mellitus    has not taken medications since 07/2016 after bariatric surgery  . DUB (dysfunctional uterine bleeding)   . Dysrhythmia    tachycardic  . External hemorrhoid   . GERD (gastroesophageal reflux disease)   . Gout   . History of hiatal hernia   . History of kidney stones   . Hypertension   . Morbid obesity ([place])   . Obesity   . Osteoarthritis, knee   . Pneumonia   . Recurrent UTI   . Rotator cuff syndrome of left shoulder     Medications:  Scheduled:  . atorvastatin  20 mg Oral QHS  . benazepril  40 mg Oral Daily  . docusate sodium  100 mg Oral BID  . insulin aspart  0-9 Units Subcutaneous TID WC  . pantoprazole  40 mg Oral Daily   Infusions:  . heparin Stopped (03/19/18 1420)   PRN:   Assessment: 57 year old women presented to the ED with chest pain. CT scan positive for a PE. Pharmacy consulted for heparin dosing. No DOAC PTA. Pt underwent pulmonary thrombectomy and received tPA. Heparin was stopped after tPA was given and aPTT  level was obtain about 30 minutes later. APTT was elevated at 117 and heparin was held and another aPTT was ordered 1 hour later. The aPTT returned at 41. We will restart the heparin gtt now that the aPTT is < 80 seconds. Will restart at ~14 unit/kg/hr rate. Goal heparin level for the next 24 hours is 0.3 - 0.5 then 0.3 to 0.7 thereafter.  Goal of Therapy:  Heparin level 0.3-0.5 units/ml for 24 hours then 0.3 to 0.7 thereafter.  Monitor platelets by anticoagulation protocol: Yes   Plan:  Will restart heparin at a 1350 unit/hr rate. Heparin level will be ordered every 8 hours.   Oswald Hillock, PharmD, BCPS 03/19/2018,5:28 PM

## 2018-03-19 NOTE — Op Note (Addendum)
[facility]  Percutaneous Study/Intervention Procedural Note   Date of Surgery: 03/19/2018,2:25 PM  Surgeon:  Leotis Pain, MD   Pre-operative Diagnosis: Symptomatic submassive bilateral pulmonary emboli  Post-operative diagnosis:  Same  Procedure(s) Performed:  1.  Ultrasound guidance for vascular access right jugular vein  2.  Thrombolysis bilateral pulmonary arteries  3.  Mechanical thrombectomy to bilateral main and lower lobe pulmonary arteries  4.  Selective catheter placement and imaging right main and lower lobe pulmonary artery  5.  Selective catheter placement and imaging left main and lower lobe pulmonary artery  6.  Right jugular triple-lumen catheter placement with fluoroscopic guidance  Anesthesia: Conscious sedation was administered under my direct supervision by the interventional radiology RN. IV Versed plus fentanyl were utilized. Continuous ECG, pulse oximetry and blood pressure was monitored throughout the entire procedure.  Versed and fentanyl were administered intravenously.  Conscious sedation was administered for a total of 30 minutes.  Total of 2 mg of Versed and 50 mcg of fentanyl were used  Sheath: 8 French right jugular vein  Contrast: 25 cc   Fluoroscopy Time: 11.7 minutes  Indications:  Patient presents with hypoxia and tachycardia and submassive bilateral pulmonary emboli. The patient is symptomatic with hypoxemia and dyspnea on exertion.  There is evidence of right heart strain on the CT angiogram. The patient is otherwise a good candidate for intervention and even the long-term benefits pulmonary angiography with thrombolysis is offered. The risks and benefits are reviewed long-term benefits are discussed. All questions are answered patient agrees to proceed.  Procedure:  Maria [name] a 57 y.o. female who was identified and appropriate procedural time out was performed.  The patient was then placed supine on the table and prepped  and draped in the usual sterile fashion.  Ultrasound was used to evaluate the right jugular vein.  It was patent, as it was echolucent and compressible.  A digital ultrasound image was acquired for the permanent record.  A micropuncture needle was used to access the right jugular vein under direct ultrasound guidance.  A microwire was then advanced under fluoroscopic guidance followed by micro-sheath.  A 0.035 J wire was advanced without resistance and a 5Fr sheath was placed and then upsized to an 8 [place] sheath.    The 8 French sheath which terminated in the right innominate vein was used for imaging and bolus injection of contrast was utilized to demonstrate the right ventricle and the pulmonary artery outflow. The wire and catheter were then negotiated into the main pulmonary artery where hand injection of contrast was utilized to demonstrate the pulmonary arteries and confirm the locations of the pulmonary emboli.  TPA was reconstituted and delivered onto the table. A total of 8 milligrams of TPA was utilized.  4 mg was administered on the left side and 4 mg was administered on the right side.   The Penumbra Cat D catheter was then advanced up into the pulmonary vasculature. The left lung was addressed first. Catheter was negotiated into the left main pulmonary artery and mechanical thrombectomy was performed. Follow-up imaging demonstrated a good result and therefore the catheter was renegotiated into the left lower lobe pulmonary artery and again mechanical thrombectomy was performed. Passes were made with both the Penumbra catheter itself. Follow-up imaging was then performed.  This showed marked improvement in the left lung pulmonary artery of what appeared to be reasonably brisk flow.  Quality of the imaging is very poor given her size and the cardiac  motion.  The Penumbra Cat D catheter was then negotiated to the opposite side over the Magic torque wire. Catheter was negotiated into the right  main pulmonary artery and mechanical thrombectomy was performed. Follow-up imaging demonstrated a good result and therefore the catheter was renegotiated into the right lower lobe pulmonary artery and again mechanical thrombectomy was performed. Passes were made with both the Penumbra catheter and a total of about 250 cc of effluent or return from both sides. Follow-up imaging was then performed.  There was significant improvement although the image quality was very poor given her size and the cardiac motion, but there appeared to be good brisk flow in the right pulmonary artery at this point.  At this point, the penumbra catheter was removed.  A wire was placed through the right jugular sheath and the sheath was removed.  A triple-lumen catheter was then placed over the wire and the wire was removed.  It was secured to the skin with 2 silk sutures at about 17 cm.  The catheter tip was parked at the cavoatrial junction by fluoroscopy.  At this point, the patient's oxygen saturations were better and she was breathing more comfortably.  She was taken to the recovery room in stable condition having tolerated the procedure well.  Findings:   Right heart imaging:  Right atrium and right ventricle and the pulmonary outflow tract appears normal although large and possibly chronically dilated  Right lung: Image quality was very poor, but there was thrombus in the main right pulmonary artery and primary branches  Left lung:  Image quality was very poor, but there was thrombus in the main left pulmonary artery and primary branches    Disposition: Patient was taken to the recovery room in stable condition having tolerated the procedure well.  Maria [name] 03/19/2018,2:25 PM

## 2018-03-19 NOTE — Progress Notes (Signed)
Pt taken off bipap, placed on non rebreather, sats 96%, respiratory rate 18/min, states her breathing is much better. Will continue to monitor.

## 2018-03-19 NOTE — Progress Notes (Signed)
ANTICOAGULATION CONSULT NOTE - Initial Consult  Pharmacy Consult for Heparin Indication: pulmonary embolus  Allergies  Allergen Reactions  . Propoxyphene N-Acetaminophen Palpitations    Patient Measurements: Height: 5\' 5"  (165.1 cm) Weight: (!) 340 lb (154.2 kg) IBW/kg (Calculated) : 57 Heparin Dosing Weight: 96.135 kg  Vital Signs: BP: 121/88 (12/09 1220) Pulse Rate: 117 (12/09 1220)  Labs: Recent Labs    03/19/18 1135  HGB 13.0  HCT 41.8  PLT 199  CREATININE 1.18*  TROPONINI 0.09*    Estimated Creatinine Clearance: 78.7 mL/min (A) (by C-G formula based on SCr of 1.18 mg/dL (H)).   Medical History: Past Medical History:  Diagnosis Date  . Allergic rhinitis   . Anemia    1980's  . Anxiety   . Carpal tunnel syndrome, left    pt denies  . Depression   . Diabetes mellitus    has not taken medications since 07/2016 after bariatric surgery  . DUB (dysfunctional uterine bleeding)   . Dysrhythmia    tachycardic  . External hemorrhoid   . GERD (gastroesophageal reflux disease)   . Gout   . History of hiatal hernia   . History of kidney stones   . Hypertension   . Morbid obesity ([place])   . Obesity   . Osteoarthritis, knee   . Pneumonia   . Recurrent UTI   . Rotator cuff syndrome of left shoulder     Medications:  Scheduled:   Infusions:  . sodium chloride 1,000 mL (03/19/18 1216)  . sodium chloride 1,000 mL (03/19/18 1219)   PRN:   Assessment: 57 year old women presented to the ED with chest pain. CT scan positive for a PE. Pharmacy consulted for heparin dosing. No DOAC PTA. Will give a 60 unit/kg dose followed by infusion starting at 18 units/kg/hour.   Goal of Therapy:  Heparin level 0.3-0.7 units/ml Monitor platelets by anticoagulation protocol: Yes   Plan:  Give 5000 units bolus x 1 Start heparin infusion at 1700 units/hr Check anti-Xa level in 6 hours and daily while on heparin Continue to monitor H&H and platelets  Oswald Hillock,  PharmD, BCPS 03/19/2018,12:28 PM

## 2018-03-19 NOTE — Consult Note (Signed)
Maria [name]  MRN : [id number]  Maria [name] is a 57 y.o. (December 28, 1960) female who presents with chief complaint of  Chief Complaint  Patient presents with  . Chest Pain  .  History of Present Illness: I am consulted by Dr. Alfred Levins in the ER for submassive PE.  She woke up this morning with chest pain in the substernal location described as severe pressure.  She took her morning metoprolol but when this did not improve she called 911 and was taken to the hospital where she was found to be markedly hypoxic.  With nonrebreather and then BiPAP she was able to get her oxygen saturations into the 90s.  Her chest pain was better with oxygenation, but she still felt heaviness and shortness of breath.  No recent travel or injury.  No previous history of DVT or pulmonary embolus.  No recent fever or chills.  She underwent a CT angiogram which I have independently reviewed which shows bilateral pulmonary emboli which are submassive and there is suggestion of right heart strain on the CT scan as well.  Current Facility-Administered Medications  Medication Dose Route Frequency Provider Last Rate Last Dose  . 0.9 %  sodium chloride infusion   Intravenous Continuous Zalayah Pizzuto, Erskine Squibb, MD      . ceFAZolin (ANCEF) IVPB 2g/100 mL premix  2 g Intravenous 30 min Pre-Op Algernon Huxley, MD      . heparin ADULT infusion 100 units/mL (25000 units/241mL sodium chloride 0.45%)  1,700 Units/hr Intravenous Continuous Oswald Hillock, RPH 17 mL/hr at 03/19/18 1305 1,700 Units/hr at 03/19/18 1305    Past Medical History:  Diagnosis Date  . Allergic rhinitis   . Anemia    1980's  . Anxiety   . Carpal tunnel syndrome, left    pt denies  . Depression   . Diabetes mellitus    has not taken medications since 07/2016 after bariatric surgery  . DUB (dysfunctional uterine bleeding)   . Dysrhythmia    tachycardic  . External hemorrhoid   . GERD (gastroesophageal reflux disease)    . Gout   . History of hiatal hernia   . History of kidney stones   . Hypertension   . Morbid obesity ([place])   . Obesity   . Osteoarthritis, knee   . Pneumonia   . Recurrent UTI   . Rotator cuff syndrome of left shoulder     Past Surgical History:  Procedure Laterality Date  . CHOLECYSTECTOMY    . INCISION AND DRAINAGE ABSCESS N/A 06/01/2014   Procedure: INCISION AND DRAINAGE ABSCESS;  Surgeon: Rolm Bookbinder, MD;  Location: [facility];  Service: General;  Laterality: N/A;  . IR GENERIC HISTORICAL  05/18/2016   IR EPIDUROGRAPHY 05/18/2016 [place] Morelle, MD [facility]  . JOINT REPLACEMENT    . LAPAROSCOPIC GASTRIC SLEEVE RESECTION WITH HIATAL HERNIA REPAIR N/A 07/26/2016   Procedure: LAPAROSCOPIC GASTRIC SLEEVE RESECTION WITH HIATAL HERNIA REPAIR, UPPER ENDOSCOPY;  Surgeon: Johnathan Hausen, MD;  Location: [facility];  Service: General;  Laterality: N/A;  . LITHOTRIPSY    . REPLACEMENT TOTAL KNEE BILATERAL    . TUBAL LIGATION      Social History Social History   Tobacco Use  . Smoking status: Never Smoker  . Smokeless tobacco: Never Used  Substance Use Topics  . Alcohol use: No  . Drug use: No    Family History Family History  Problem Relation Age of Onset  . Coronary artery disease Unknown   .  Hypertension Unknown   . Diabetes Unknown   . Obesity Unknown   . Diabetes Mother   . Hypertension Mother   . Congestive Heart Failure Mother   No bleeding or clotting disorders  Allergies  Allergen Reactions  . Propoxyphene N-Acetaminophen Palpitations     REVIEW OF SYSTEMS (Negative unless checked)  Constitutional: [] Weight loss  [] Fever  [] Chills Cardiac: [x] Chest pain   [x] Chest pressure   [x] Palpitations   [] Shortness of breath when laying flat   [x] Shortness of breath at rest   [x] Shortness of breath with exertion. Vascular:  [] Pain in legs with walking   [] Pain in legs at rest   [] Pain in legs when laying flat   [] Claudication   [] Pain in feet when walking  [] Pain  in feet at rest  [] Pain in feet when laying flat   [x] History of DVT   [] Phlebitis   [] Swelling in legs   [] Varicose veins   [] Non-healing ulcers Pulmonary:   [] Uses home oxygen   [] Productive cough   [] Hemoptysis   [] Wheeze  [] COPD   [] Asthma Neurologic:  [] Dizziness  [] Blackouts   [] Seizures   [] History of stroke   [] History of TIA  [] Aphasia   [] Temporary blindness   [] Dysphagia   [] Weakness or numbness in arms   [] Weakness or numbness in legs Musculoskeletal:  [x] Arthritis   [] Joint swelling   [] Joint pain   [] Low back pain Hematologic:  [] Easy bruising  [] Easy bleeding   [] Hypercoagulable state   [] Anemic  [] Hepatitis Gastrointestinal:  [] Blood in stool   [] Vomiting blood  [] Gastroesophageal reflux/heartburn   [] Difficulty swallowing. Genitourinary:  [] Chronic kidney disease   [] Difficult urination  [] Frequent urination  [] Burning with urination   [] Blood in urine Skin:  [] Rashes   [] Ulcers   [] Wounds Psychological:  [] History of anxiety   []  History of major depression.  Physical Examination  Vitals:   03/19/18 1130 03/19/18 1140 03/19/18 1220 03/19/18 1300  BP: 110/80  121/88 116/90  Pulse: (!) 117 (!) 119 (!) 117 (!) 120  Resp: 20  (!) 24 (!) 21  SpO2: 92% 96% 100% 96%  Weight:      Height:       Body mass index is 56.58 kg/m. Gen: Morbidly obese.  In discomfort with labored respirations Head: [place]/AT, No temporalis wasting.  Ear/Nose/Throat: Hearing grossly intact, nares w/o erythema or drainage, oropharynx w/o Erythema/Exudate Eyes: Sclera non-icteric, conjunctiva clear Neck: Trachea midline.  No JVD.  Pulmonary:  Good air movement, respirations are somewhat labored and she is tachypneic even on BiPAP Cardiac: Tachycardic and somewhat irregular Vascular:  Vessel Right Left  Radial Palpable Palpable                                   Gastrointestinal: soft, non-tender/non-distended.  Musculoskeletal: M/S 5/5 throughout.  Extremities without ischemic changes.  No  deformity or atrophy.  Neurologic: Sensation grossly intact in extremities.  Symmetrical.  Speech is fluent. Motor exam as listed above. Psychiatric: Judgment intact, Mood & affect appropriate for pt's clinical situation. Dermatologic: No rashes or ulcers noted.  No cellulitis or open wounds.       CBC Lab Results  Component Value Date   WBC 16.0 (H) 03/19/2018   HGB 13.0 03/19/2018   HCT 41.8 03/19/2018   MCV 90.5 03/19/2018   PLT 199 03/19/2018    BMET    Component Value Date/Time   NA 137 03/19/2018 1135  NA 139 04/23/2012 0317   K 3.7 03/19/2018 1135   K 4.2 04/23/2012 0751   CL 101 03/19/2018 1135   CL 103 04/23/2012 0317   CO2 22 03/19/2018 1135   CO2 28 04/23/2012 0317   GLUCOSE 482 (H) 03/19/2018 1135   GLUCOSE 290 (H) 04/23/2012 0317   BUN 20 03/19/2018 1135   BUN 30 (H) 04/23/2012 0317   CREATININE 1.18 (H) 03/19/2018 1135   CREATININE 1.14 04/23/2012 0317   CALCIUM 8.6 (L) 03/19/2018 1135   CALCIUM 8.7 04/23/2012 0317   GFRNONAA 51 (L) 03/19/2018 1135   GFRNONAA 55 (L) 04/23/2012 0317   GFRAA 59 (L) 03/19/2018 1135   GFRAA >60 04/23/2012 0317   Estimated Creatinine Clearance: 78.7 mL/min (A) (by C-G formula based on SCr of 1.18 mg/dL (H)).  COAG No results found for: INR, PROTIME  Radiology Ct Angio Chest Pe W And/or Wo Contrast  Result Date: 03/19/2018 CLINICAL DATA:  Chest pain since 9 a.m. this morning. EXAM: CT ANGIOGRAPHY CHEST WITH CONTRAST TECHNIQUE: Multidetector CT imaging of the chest was performed using the standard protocol during bolus administration of intravenous contrast. Multiplanar CT image reconstructions and MIPs were obtained to evaluate the vascular anatomy. CONTRAST:  75 mL OMNIPAQUE IOHEXOL 350 MG/ML SOLN COMPARISON:  Single-view of the chest 03/19/2018. FINDINGS: Cardiovascular: The patient has extensive bilateral pulmonary emboli. Clot is seen interlobar branches bilaterally, more blunted on the right. Right ventricular  diameter is 5.6 cm and left ventricular diameter is 2.2 cm for and RV to LV ratio of 2.5 consistent with right heart strain. The interventricular septum is bowed to the left. Heart size is enlarged. No pericardial effusion. No atherosclerosis. Mediastinum/Nodes: No lymphadenopathy. 1.2 cm low attenuating lesion right lobe of the thyroid incidentally noted. The esophagus appears normal. Cardiomegaly. Lungs/Pleura: No pleural effusion.  The lungs are clear. Upper Abdomen: No acute abnormality. The liver is somewhat low attenuating consistent with fatty infiltration. Musculoskeletal: No acute or focal abnormality Review of the MIP images confirms the above findings. IMPRESSION: Positive for acute PE with CT evidence of right heart strain (RV/LV Ratio = 2.5) consistent with at least submassive (intermediate risk) PE. The presence of right heart strain has been associated with an increased risk of morbidity and mortality. Please activate Code PE by paging [phone]. Small hiatal hernia. Cardiomegaly. Critical Value/emergent results were called by telephone at the time of interpretation on 03/19/2018 at 12:05 pm to Dr. Quentin Cornwall, who verbally acknowledged these results. Electronically Signed   By: Inge Rise M.D.   On: 03/19/2018 12:08   Dg Chest Portable 1 View  Result Date: 03/19/2018 CLINICAL DATA:  Chest pain.  Hypoxia. EXAM: PORTABLE CHEST 1 VIEW COMPARISON:  03/15/2017 FINDINGS: The heart size and mediastinal contours are within normal limits. Both lungs are clear. The visualized skeletal structures are unremarkable. IMPRESSION: No active disease. Electronically Signed   By: Lorriane Shire M.D.   On: 03/19/2018 11:39      Assessment/Plan 1.  Submassive PE with right heart strain.  See CT scan as above.  Given her instability and her very large pulmonary embolus, she would be a good candidate for catheter directed thrombolytic therapy and thrombectomy.  I have discussed with the patient the risks and  benefits and why we are performing it.  She is agreeable to proceed.  She will need to be admitted to the intensivist service following this for treatment and care with her pulmonary embolus.  She should continue anticoagulation. 2.  Morbid obesity.  This is likely a risk factor for thromboembolic event.   3.  Hypertension.  Stable on outpatient medications and her blood pressure has remained stable.  This is an atherosclerotic risk factor. 4.  Diabetes.  Improved after bariatric surgery last year.   Leotis Pain, MD  03/19/2018 1:23 PM    This [name] was created with Dragon medical transcription system.  Any error is purely unintentional

## 2018-03-19 NOTE — ED Provider Notes (Signed)
[facility] Emergency Department Provider Note  ____________________________________________  Time seen: Approximately 11:33 AM  I have reviewed the triage vital signs and the nursing notes.   HISTORY  Chief Complaint Chest Pain   HPI Maria [name] is a 57 y.o. female with a history of diabetes, anemia, tachycardia, hypertension, obesity who presents for evaluation of chest pain.  Patient reports that she woke up this morning her usual state of health.  She was walking to the kitchen when she started feeling dizzy.  She felt like she was going to pass out.  She then developed pain in her chest that she describes as severe pressure substernally.  She thought she maybe was having a panic attack and try to calm herself down.  She then took her morning metoprolol.  When her symptoms did not improve she called 911.  Patient was found to be hypoxic with oxygen in the 60s.  She was transported on a nonrebreather at 15 L with improvement of her sats to the mid 90s and resolution of her chest pain.  Patient reports full resolution of her dizziness and chest pain once oxygen was given.  Patient denies any history of cardiomyopathy, heart failure, pulmonary embolism or DVT, COPD or smoking.  She has no family history of PE or DVT, no recent travel immobilization, no leg pain or swelling, no hemoptysis or exogenous hormones.  No fever or chills, cough or congestion.   Past Medical History:  Diagnosis Date  . Allergic rhinitis   . Anemia    1980's  . Anxiety   . Carpal tunnel syndrome, left    pt denies  . Depression   . Diabetes mellitus    has not taken medications since 07/2016 after bariatric surgery  . DUB (dysfunctional uterine bleeding)   . Dysrhythmia    tachycardic  . External hemorrhoid   . GERD (gastroesophageal reflux disease)   . Gout   . History of hiatal hernia   . History of kidney stones   . Hypertension   . Morbid obesity ([place])   . Obesity   .  Osteoarthritis, knee   . Pneumonia   . Recurrent UTI   . Rotator cuff syndrome of left shoulder     Patient Active Problem List   Diagnosis Date Noted  . Achilles tendinitis of right lower extremity 12/23/2017  . 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 Cervical disc disorder with radiculopathy of cervical region 02/05/2016  . Osteoarthritis of left hip 08/22/2014  . Cellulitis and abscess 05/31/2014  . Radicular pain in left arm 09/27/2013  . Rotator cuff syndrome 06/29/2011  . S/P TKR (total knee replacement) 01/17/2011  . GOUT, UNSPECIFIED 10/09/2009  . HEMORRHOIDS, EXTERNAL 06/17/2009  . Depression 04/23/2009  . NEPHROLITHIASIS, RECURRENT 04/23/2009  . Disorder of bursae and tendons in shoulder region 09/19/2008  . GERD 02/25/2008  . Diabetes type 2, uncontrolled ([place]) 04/26/2006  . Hyperlipidemia 04/26/2006  . ANXIETY STATE NOS 04/26/2006  . Obstructive sleep apnea 04/26/2006  . MORBID OBESITY 01/11/2006  . Essential hypertension 01/11/2006  . ALLERGIC RHINITIS 01/11/2006  . DYSFUNCTIONAL UTERINE BLEEDING 01/11/2006    Past Surgical History:  Procedure Laterality Date  . CHOLECYSTECTOMY    . INCISION AND DRAINAGE ABSCESS N/A 06/01/2014   Procedure: INCISION AND DRAINAGE ABSCESS;  Surgeon: Rolm Bookbinder, MD;  Location: [place];  Service: General;  Laterality: N/A;  . IR GENERIC HISTORICAL  05/18/2016   IR EPIDUROGRAPHY 05/18/2016 [place], MD [facility]-INTER[facility]  RAD  . JOINT REPLACEMENT    . LAPAROSCOPIC GASTRIC SLEEVE RESECTION WITH HIATAL HERNIA REPAIR N/A 07/26/2016   Procedure: LAPAROSCOPIC GASTRIC SLEEVE RESECTION WITH HIATAL HERNIA REPAIR, UPPER ENDOSCOPY;  Surgeon: Johnathan Hausen, MD;  Location: [facility];  Service: General;  Laterality: N/A;  . LITHOTRIPSY    . REPLACEMENT TOTAL KNEE BILATERAL    . TUBAL LIGATION      Prior to Admission medications   Medication Sig Start Date End Date Taking? Authorizing Provider  benazepril (LOTENSIN) 40 MG  tablet Take 40 mg by mouth daily.  07/06/16   [provider]  Calcium-Vitamin D-Vitamin K (CALCIUM + D) [id number] MG-UNT-MCG CHEW Chew 2 each by mouth daily.    [provider]  colchicine 0.6 MG tablet Take 1 tablet (0.6 mg total) by mouth daily. 01/05/17   Dickie La, MD  fluconazole (DIFLUCAN) 150 MG tablet TAKE ONE TABLET BY MOUTH FOR ONE DOSE 01/02/18   Donnamae Jude, MD  furosemide (LASIX) 20 MG tablet Take 20 mg by mouth daily.  12/28/15   [provider]  glipiZIDE (GLUCOTROL) 5 MG tablet TK 1 T PO QAM 07/13/17   [provider]  LANTUS SOLOSTAR 100 UNIT/ML Solostar Pen INJECT 10 UNITS SUBCUTONEOUSLY AT BEDTIME 07/19/17   [provider]  metFORMIN (GLUCOPHAGE) 850 MG tablet TK 1 T PO BID 07/13/17   [provider]  metoprolol (TOPROL-XL) 200 MG 24 hr tablet Take 200 mg by mouth daily.     [provider]  metroNIDAZOLE (FLAGYL) 500 MG tablet Take 1 tablet (500 mg total) by mouth 2 (two) times daily. 12/14/17   Anyanwu, Sallyanne Havers, MD  metroNIDAZOLE (FLAGYL) 500 MG tablet Take 1 tablet (500 mg total) by mouth 2 (two) times daily. 01/22/18   Anyanwu, Sallyanne Havers, MD  Multiple Vitamins-Minerals (MULTIVITAMIN WITH MINERALS) tablet Take 1 tablet by mouth daily.    [provider]  nitroGLYCERIN (NITRODUR - DOSED IN MG/24 HR) 0.2 mg/hr patch Use 1/4 patch daily to the affected area. 02/02/18   Thurman Coyer, DO  oxyCODONE (ROXICODONE) 15 MG immediate release tablet Take 15 mg by mouth every 6 (six) hours as needed for pain.    [provider]  pantoprazole (PROTONIX) 40 MG tablet Take 40 mg by mouth daily at 2 PM.  12/04/15   [provider]    Allergies Propoxyphene n-acetaminophen  Family History  Problem Relation Age of Onset  . Coronary artery disease Unknown   . Hypertension Unknown   . Diabetes Unknown   . Obesity Unknown   . Diabetes Mother   . Hypertension Mother   . Congestive Heart Failure  Mother     Social History Social History   Tobacco Use  . Smoking status: Never Smoker  . Smokeless tobacco: Never Used  Substance Use Topics  . Alcohol use: No  . Drug use: No    Review of Systems  Constitutional: Negative for fever. + Lightheadedness Eyes: Negative for visual changes. ENT: Negative for sore throat. Neck: No neck pain  Cardiovascular: + chest pain. Respiratory: Negative for shortness of breath. + Hypoxia Gastrointestinal: Negative for abdominal pain, vomiting or diarrhea. Genitourinary: Negative for dysuria. Musculoskeletal: Negative for back pain. Skin: Negative for rash. Neurological: Negative for headaches, weakness or numbness. Psych: No SI or HI  ____________________________________________   PHYSICAL EXAM:  VITAL SIGNS: ED Triage Vitals  Enc Vitals Group     BP 03/19/18 1121 (!) 112/48     Pulse  Rate 03/19/18 1121 (!) 122     Resp 03/19/18 1121 (!) 25     Temp --      Temp src --      SpO2 03/19/18 1120 94 %     Weight 03/19/18 1123 (!) 340 lb (154.2 kg)     Height 03/19/18 1123 5\' 5"  (1.651 m)     Head Circumference --      Peak Flow --      Pain Score 03/19/18 1122 0     Pain Loc --      Pain Edu? --      Excl. in [place]? --     Constitutional: Alert and oriented in mild respiratory distress.  HEENT:      Head: Normocephalic and atraumatic.         Eyes: Conjunctivae are normal. Sclera is non-icteric.       Mouth/Throat: Mucous membranes are moist.       Neck: Supple with no signs of meningismus. Cardiovascular: Tachycardic with regular rhythm. No murmurs, gallops, or rubs. 2+ symmetrical distal pulses are present in all extremities. No JVD. Respiratory: Patient with a normal work of breathing slightly increased respiratory rate but hypoxic to the 60s on room air.  Her sats improved to low 90s on a 15 L nonrebreather.  Lungs are completely clear to auscultation with good air movement bilaterally. Gastrointestinal: Soft, non tender,  and non distended with positive bowel sounds. No rebound or guarding. Musculoskeletal: Nontender with normal range of motion in all extremities. No edema, cyanosis, or erythema of extremities. Neurologic: Normal speech and language. Face is symmetric. Moving all extremities. No gross focal neurologic deficits are appreciated. Skin: Skin is warm, dry and intact. No rash noted. Psychiatric: Mood and affect are normal. Speech and behavior are normal.  ____________________________________________   LABS (all labs ordered are listed, but only abnormal results are displayed)  Labs Reviewed  CBC WITH DIFFERENTIAL/PLATELET - Abnormal; Notable for the following components:      Result Value   WBC 16.0 (*)    Neutro Abs 9.0 (*)    Lymphs Abs 6.0 (*)    Abs Immature Granulocytes 0.10 (*)    All other components within normal limits  BASIC METABOLIC PANEL - Abnormal; Notable for the following components:   Glucose, Bld 482 (*)    Creatinine, Ser 1.18 (*)    Calcium 8.6 (*)    GFR calc non Af Amer 51 (*)    GFR calc Af Amer 59 (*)    All other components within normal limits  TROPONIN I - Abnormal; Notable for the following components:   Troponin I 0.09 (*)    All other components within normal limits   ____________________________________________  EKG  ED ECG REPORT I, Rudene Re, the attending physician, personally viewed and interpreted this ECG.  Sinus tachycardia, rate of 118, normal intervals, normal axis, Q waves in inferior and anterior leads with no ST elevations or depressions.  Q waves are new when compared to prior. ____________________________________________  RADIOLOGY  I have personally reviewed the images performed during this visit and I agree with the Radiologist's read.   Interpretation by Radiologist:  Ct Angio Chest Pe W And/or Wo Contrast  Result Date: 03/19/2018 CLINICAL DATA:  Chest pain since 9 a.m. this morning. EXAM: CT ANGIOGRAPHY CHEST WITH  CONTRAST TECHNIQUE: Multidetector CT imaging of the chest was performed using the standard protocol during bolus administration of intravenous contrast. Multiplanar CT image reconstructions and MIPs were  obtained to evaluate the vascular anatomy. CONTRAST:  75 mL OMNIPAQUE IOHEXOL 350 MG/ML SOLN COMPARISON:  Single-view of the chest 03/19/2018. FINDINGS: Cardiovascular: The patient has extensive bilateral pulmonary emboli. Clot is seen interlobar branches bilaterally, more blunted on the right. Right ventricular diameter is 5.6 cm and left ventricular diameter is 2.2 cm for and RV to LV ratio of 2.5 consistent with right heart strain. The interventricular septum is bowed to the left. Heart size is enlarged. No pericardial effusion. No atherosclerosis. Mediastinum/Nodes: No lymphadenopathy. 1.2 cm low attenuating lesion right lobe of the thyroid incidentally noted. The esophagus appears normal. Cardiomegaly. Lungs/Pleura: No pleural effusion.  The lungs are clear. Upper Abdomen: No acute abnormality. The liver is somewhat low attenuating consistent with fatty infiltration. Musculoskeletal: No acute or focal abnormality Review of the MIP images confirms the above findings. IMPRESSION: Positive for acute PE with CT evidence of right heart strain (RV/LV Ratio = 2.5) consistent with at least submassive (intermediate risk) PE. The presence of right heart strain has been associated with an increased risk of morbidity and mortality. Please activate Code PE by paging [phone]. Small hiatal hernia. Cardiomegaly. Critical Value/emergent results were called by telephone at the time of interpretation on 03/19/2018 at 12:05 pm to Dr. Quentin Cornwall, who verbally acknowledged these results. Electronically Signed   By: Inge Rise M.D.   On: 03/19/2018 12:08   Dg Chest Portable 1 View  Result Date: 03/19/2018 CLINICAL DATA:  Chest pain.  Hypoxia. EXAM: PORTABLE CHEST 1 VIEW COMPARISON:  03/15/2017 FINDINGS: The heart size and  mediastinal contours are within normal limits. Both lungs are clear. The visualized skeletal structures are unremarkable. IMPRESSION: No active disease. Electronically Signed   By: Lorriane Shire M.D.   On: 03/19/2018 11:39      ____________________________________________   PROCEDURES  Procedure(s) performed: None Procedures Critical Care performed: yes  CRITICAL CARE Performed by: Rudene Re  ?  Total critical care time: 40 min  Critical care time was exclusive of separately billable procedures and treating other patients.  Critical care was necessary to treat or prevent imminent or life-threatening deterioration.  Critical care was time spent personally by me on the following activities: development of treatment plan with patient and/or surrogate as well as nursing, discussions with consultants, evaluation of patient's response to treatment, examination of patient, obtaining history from patient or surrogate, ordering and performing treatments and interventions, ordering and review of laboratory studies, ordering and review of radiographic studies, pulse oximetry and re-evaluation of patient's condition.  ____________________________________________   INITIAL IMPRESSION / ASSESSMENT AND PLAN / ED COURSE   57 y.o. female with a history of diabetes, anemia, tachycardia, hypertension, obesity who presents for evaluation of chest pain and hypoxia.  Patient arrives hypoxic to the 60s on room air, oxygenation improved on a nonrebreather, lungs are clear to auscultation, no asymmetric pitting edema, patient does not look volume overloaded however examination is limited due to body habitus. CXR showing clear lungs. EKG showing no signs of acute ischemia. With no history of COPD or smoking, clear lungs on auscultation, chest x-ray negative for pneumothorax, pulmonary edema, or large masses/pneumonia, I am very concerned for a large pulmonary embolus or pericardial effusion.  Will  start patient on BiPAP, will start IV fluids to increase patient's preload.  We will send patient for CT angiogram of the chest.  Will monitor respiratory status closely.    _________________________ 12:15 PM on 03/19/2018 -----------------------------------------  CT showing bilateral submassive PE with RV strain. Elevated troponin  at 0.09. Patient on Bipap, receiving 2L od NS for preload. Heparin ordered. Dr. Lucky Cowboy who recommended IV heparin and intra-arterial tPA which he will plan on doing today. Discussed with hospitalist for [place] admission.   As part of my medical decision making, I reviewed the following data within the [place] notes reviewed and incorporated, Labs reviewed , EKG interpreted , Old EKG reviewed, Old chart reviewed, Discussed with admitting physician , A consult was requested and obtained from this/these consultant(s) Vascular surgery, Notes from prior ED visits and [place] Controlled Substance Database    Pertinent labs & imaging results that were available during my care of the patient were reviewed by me and considered in my medical decision making (see chart for details).    ____________________________________________   FINAL CLINICAL IMPRESSION(S) / ED DIAGNOSES  Final diagnoses:  Other acute pulmonary embolism with acute cor pulmonale (HCC)  Acute respiratory failure with hypoxia (HCC)      NEW MEDICATIONS STARTED DURING THIS VISIT:  ED Discharge Orders    None       Note:  This document was prepared using Dragon voice recognition software and may include unintentional dictation errors.    Alfred Levins, [place], MD 03/19/18 1225

## 2018-03-19 NOTE — Consult Note (Signed)
Reason for Consult: Assistance with management of submassive PE. Referring Physician: Baruch Merl is an 57 y.o. female.  HPI:   Patient is a 57 year old morbidly obese woman, lifelong never smoker, who presented to the emergency room today with a complaint of chest pain and shortness of breath.  The patient also had some associated presyncopal sensation associated with this.  Patient was evaluated at the emergency room, CT scan of the chest was performed that showed evidence of extensive bilateral PE with right heart strain.  The patient underwent thrombolysis and mechanical thrombectomy of the clot by Dr. Lucky Cowboy.  She then was transferred to the intensive care unit on 100% nonrebreather.  At that time her saturations were hovering around 90 to 91%.  Subsequently she became hypoxic again and had to be placed on BiPAP at 100% FiO2.  Her blood pressure then started to decrease as well.  Despite all this the patient was mentating well and able to state that overall her breathing felt better and her chest pain was resolved.  Because of the evidence of right heart strain the patient was given IV fluid boluses and placed on dopamine IV for hypotension.  This in turn helped her oxygen saturations indicating that the perceived hypoxemia was likely related to poor flow.  The patient has stabilized at this point with IV fluids and pressors after her thrombectomy.  Patient has not experienced any lower extremity pain however she has had lower extremity edema.  She has not had any fevers, chills or sweats.  No cough.  Currently voices no other complaints but review of systems is limited by her need of BiPAP.  Past Medical History:  Diagnosis Date  . Allergic rhinitis   . Anemia    1980's  . Anxiety   . Carpal tunnel syndrome, left    pt denies  . Depression   . Diabetes mellitus    has not taken medications since 07/2016 after bariatric surgery  . DUB (dysfunctional uterine bleeding)   .  Dysrhythmia    tachycardic  . External hemorrhoid   . GERD (gastroesophageal reflux disease)   . Gout   . History of hiatal hernia   . History of kidney stones   . Hypertension   . Morbid obesity ([place])   . Obesity   . Osteoarthritis, knee   . Pneumonia   . Recurrent UTI   . Rotator cuff syndrome of left shoulder     Past Surgical History:  Procedure Laterality Date  . CHOLECYSTECTOMY    . INCISION AND DRAINAGE ABSCESS N/A 06/01/2014   Procedure: INCISION AND DRAINAGE ABSCESS;  Surgeon: Rolm Bookbinder, MD;  Location: [facility];  Service: General;  Laterality: N/A;  . IR GENERIC HISTORICAL  05/18/2016   IR EPIDUROGRAPHY 05/18/2016 [place], MD [facility]  . JOINT REPLACEMENT    . LAPAROSCOPIC GASTRIC SLEEVE RESECTION WITH HIATAL HERNIA REPAIR N/A 07/26/2016   Procedure: LAPAROSCOPIC GASTRIC SLEEVE RESECTION WITH HIATAL HERNIA REPAIR, UPPER ENDOSCOPY;  Surgeon: Johnathan Hausen, MD;  Location: [facility];  Service: General;  Laterality: N/A;  . LITHOTRIPSY    . REPLACEMENT TOTAL KNEE BILATERAL    . TUBAL LIGATION      Family History  Problem Relation Age of Onset  . Coronary artery disease Unknown   . Hypertension Unknown   . Diabetes Unknown   . Obesity Unknown   . Diabetes Mother   . Hypertension Mother   . Congestive Heart Failure Mother  Social History:  reports that she has never smoked. She has never used smokeless tobacco. She reports that she does not drink alcohol or use drugs.  Allergies:  Allergies  Allergen Reactions  . Propoxyphene N-Acetaminophen Palpitations    Medications: I have reviewed the patient's current medications.  Results for orders placed or performed during the hospital encounter of 03/19/18 (from the past 48 hour(s))  CBC with Differential/Platelet     Status: Abnormal   Collection Time: 03/19/18 11:35 AM  Result Value Ref Range   WBC 16.0 (H) 4.0 - 10.5 K/uL   RBC 4.62 3.87 - 5.11 MIL/uL   Hemoglobin 13.0 12.0 - 15.0 g/dL   HCT  41.8 36.0 - 46.0 %   MCV 90.5 80.0 - 100.0 fL   MCH 28.1 26.0 - 34.0 pg   MCHC 31.1 30.0 - 36.0 g/dL   RDW 13.7 11.5 - 15.5 %   Platelets 199 150 - 400 K/uL   nRBC 0.0 0.0 - 0.2 %   Neutrophils Relative % 55 %   Neutro Abs 9.0 (H) 1.7 - 7.7 K/uL   Lymphocytes Relative 38 %   Lymphs Abs 6.0 (H) 0.7 - 4.0 K/uL   Monocytes Relative 5 %   Monocytes Absolute 0.7 0.1 - 1.0 K/uL   Eosinophils Relative 1 %   Eosinophils Absolute 0.1 0.0 - 0.5 K/uL   Basophils Relative 0 %   Basophils Absolute 0.1 0.0 - 0.1 K/uL   Immature Granulocytes 1 %   Abs Immature Granulocytes 0.10 (H) 0.00 - 0.07 K/uL    Comment: Performed at [facility], [address] [place], [place] [address]  Basic metabolic panel     Status: Abnormal   Collection Time: 03/19/18 11:35 AM  Result Value Ref Range   Sodium 137 135 - 145 mmol/L   Potassium 3.7 3.5 - 5.1 mmol/L   Chloride 101 98 - 111 mmol/L   CO2 22 22 - 32 mmol/L   Glucose, Bld 482 (H) 70 - 99 mg/dL   BUN 20 6 - 20 mg/dL   Creatinine, Ser 1.18 (H) 0.44 - 1.00 mg/dL   Calcium 8.6 (L) 8.9 - 10.3 mg/dL   GFR calc non Af Amer 51 (L) >60 mL/min   GFR calc Af Amer 59 (L) >60 mL/min   Anion gap 14 5 - 15    Comment: Performed at [facility], [address] [place], [place] [address]  Troponin I - ONCE - STAT     Status: Abnormal   Collection Time: 03/19/18 11:35 AM  Result Value Ref Range   Troponin I 0.09 (HH) <0.03 ng/mL    Comment: CRITICAL RESULT CALLED TO, READ BACK BY AND VERIFIED WITH KELLEY PENDLETON AT 1207 03/19/18 DAS Performed at [facility], [address] [place], [place] [address]   Protime-INR     Status: None   Collection Time: 03/19/18  2:26 PM  Result Value Ref Range   Prothrombin Time 14.3 11.4 - 15.2 seconds   INR 1.12     Comment: Performed at [facility], [address] [place], [place] [address]  APTT     Status: Abnormal   Collection Time: 03/19/18  2:26 PM  Result Value Ref Range    aPTT 117 (H) 24 - 36 seconds    Comment:        IF BASELINE aPTT IS ELEVATED, SUGGEST PATIENT RISK ASSESSMENT BE USED TO DETERMINE APPROPRIATE ANTICOAGULANT THERAPY. Performed at [facility], [address] [place], [place] [address]   Glucose, capillary     Status: Abnormal   Collection Time: 03/19/18  2:42 PM  Result Value Ref Range   Glucose-Capillary 407 (H) 70 - 99 mg/dL  APTT     Status: Abnormal   Collection Time: 03/19/18  4:37 PM  Result Value Ref Range   aPTT 41 (H) 24 - 36 seconds    Comment:        IF BASELINE aPTT IS ELEVATED, SUGGEST PATIENT RISK ASSESSMENT BE USED TO DETERMINE APPROPRIATE ANTICOAGULANT THERAPY. Performed at [facility], [address] [place], [place] [address]   MRSA PCR Screening     Status: None   Collection Time: 03/19/18  4:44 PM  Result Value Ref Range   MRSA by PCR NEGATIVE NEGATIVE    Comment:        The GeneXpert MRSA Assay (FDA approved for NASAL specimens only), is one component of a comprehensive MRSA colonization surveillance program. It is not intended to diagnose MRSA infection nor to guide or monitor treatment for MRSA infections. Performed at [facility], [address] [place], [place] [address]   Glucose, capillary     Status: Abnormal   Collection Time: 03/19/18  6:43 PM  Result Value Ref Range   Glucose-Capillary 440 (H) 70 - 99 mg/dL  Troponin I - Now Then Q6H     Status: Abnormal   Collection Time: 03/19/18  7:03 PM  Result Value Ref Range   Troponin I 0.76 (HH) <0.03 ng/mL    Comment: CRITICAL RESULT CALLED TO, READ BACK BY AND VERIFIED WITH BARBARA THAO @2028  ON 03/19/18 BY FMW Performed at [facility], [address] [place], [place] [address]   Glucose, capillary     Status: Abnormal   Collection Time: 03/19/18  8:09 PM  Result Value Ref Range   Glucose-Capillary 405 (H) 70 - 99 mg/dL    Ct Angio Chest Pe W And/or Wo Contrast  Result Date:  03/19/2018 CLINICAL DATA:  Chest pain since 9 a.m. this morning. EXAM: CT ANGIOGRAPHY CHEST WITH CONTRAST TECHNIQUE: Multidetector CT imaging of the chest was performed using the standard protocol during bolus administration of intravenous contrast. Multiplanar CT image reconstructions and MIPs were obtained to evaluate the vascular anatomy. CONTRAST:  75 mL OMNIPAQUE IOHEXOL 350 MG/ML SOLN COMPARISON:  Single-view of the chest 03/19/2018. FINDINGS: Cardiovascular: The patient has extensive bilateral pulmonary emboli. Clot is seen interlobar branches bilaterally, more blunted on the right. Right ventricular diameter is 5.6 cm and left ventricular diameter is 2.2 cm for and RV to LV ratio of 2.5 consistent with right heart strain. The interventricular septum is bowed to the left. Heart size is enlarged. No pericardial effusion. No atherosclerosis. Mediastinum/Nodes: No lymphadenopathy. 1.2 cm low attenuating lesion right lobe of the thyroid incidentally noted. The esophagus appears normal. Cardiomegaly. Lungs/Pleura: No pleural effusion.  The lungs are clear. Upper Abdomen: No acute abnormality. The liver is somewhat low attenuating consistent with fatty infiltration. Musculoskeletal: No acute or focal abnormality Review of the MIP images confirms the above findings. IMPRESSION: Positive for acute PE with CT evidence of right heart strain (RV/LV Ratio = 2.5) consistent with at least submassive (intermediate risk) PE. The presence of right heart strain has been associated with an increased risk of morbidity and mortality. Please activate Code PE by paging [phone]. Small hiatal hernia. Cardiomegaly. Critical Value/emergent results were called by telephone at the time of interpretation on 03/19/2018 at 12:05 pm to  Dr. Quentin Cornwall, who verbally acknowledged these results. Electronically Signed   By: Inge Rise M.D.   On: 03/19/2018 12:08   Dg Chest Portable 1 View  Result Date: 03/19/2018 CLINICAL DATA:  Chest  pain.  Hypoxia. EXAM: PORTABLE CHEST 1 VIEW COMPARISON:  03/15/2017 FINDINGS: The heart size and mediastinal contours are within normal limits. Both lungs are clear. The visualized skeletal structures are unremarkable. IMPRESSION: No active disease. Electronically Signed   By: Lorriane Shire M.D.   On: 03/19/2018 11:39    Review of Systems  Constitutional: Negative.   HENT: Negative.   Eyes: Negative.   Respiratory: Positive for shortness of breath.   Cardiovascular: Positive for chest pain and leg swelling.  Gastrointestinal: Negative.   Genitourinary: Negative.   Musculoskeletal: Positive for joint pain.  Skin: Negative.   Neurological: Positive for dizziness.       Presyncopal sensation  Endo/Heme/Allergies: Negative.   Psychiatric/Behavioral: Negative.   All other systems reviewed and are negative.  Blood pressure 99/61, pulse 82, temperature 98.1 F (36.7 C), temperature source Oral, resp. rate (!) 24, height 5\' 5"  (1.651 m), weight (!) 154.2 kg, last menstrual period 04/11/2009, SpO2 95 %. Physical Exam  Nursing note and vitals reviewed. Constitutional: She is oriented to person, place, and time. She appears well-developed.  Morbidly obese woman, with BiPAP mask, no respiratory distress  HENT:  Head: Normocephalic and atraumatic.  Right Ear: External ear normal.  Left Ear: External ear normal.  Nose: Nose normal.  Eyes: Pupils are equal, round, and reactive to light. Conjunctivae are normal. No scleral icterus.  Neck: Neck supple. No JVD present. No tracheal deviation present. No thyromegaly present.  Cardiovascular: Normal rate, regular rhythm, normal heart sounds and intact distal pulses.  No murmur heard. Respiratory: Effort normal and breath sounds normal. No stridor. No respiratory distress.  GI: Soft. Bowel sounds are normal. She exhibits no distension.  Obese  Musculoskeletal: She exhibits edema. She exhibits no tenderness or deformity.  Lymphadenopathy:    She has  no cervical adenopathy.  Neurological: She is alert and oriented to person, place, and time.  No overt focal deficit  Skin: Skin is dry. No rash noted. No erythema. No pallor.  Cool distal digits extremities  Psychiatric: She has a normal mood and affect. Her behavior is normal. Judgment and thought content normal.    Assessment/Plan:  1.  Acute hypoxemic respiratory failure due to submassive PE with associated right heart strain.  Continue oxygen supplementation and wean off as tolerated.  The perceived hypoxemia on oximetry may have been related to low flow state due to RV strain.  Remains high risk for intubation but appears to have stabilized now after interventions with BiPAP, IV fluids and dopamine IV.  Continue to monitor.  2.  Submassive PE: She will remain on anticoagulation.  She has received thrombolyzes and mechanical thrombectomy.  3.  Right ventricular strain with associated cardiogenic shock due to #2 above, supportive care.  Is responding well to IV fluids, she will need higher filling pressures due to right ventricular strain.  Dopamine added with resolution of her hypotension and improvement of blood flow.  She will need 2D echo in the morning.  Check troponins.  4.  Diabetes mellitus in poor control.  Will institute sliding scale.  She may need insulin infusion.  5.  Super morbid obesity with BMI of 56, this issue adds complexity to her management.  She will benefit ultimately from weight loss.  Total critical care time  spent with this patient 90 minutes.  Vernard Gambles 03/19/2018, 9:16 PM

## 2018-03-19 NOTE — Progress Notes (Signed)
Unable to draw ABG due to low blood pressure. MD aware, ok to hold draw for now.

## 2018-03-19 NOTE — Progress Notes (Signed)
Attempted Heated HFNC 100% flow 50LPM to increase pt sat. Pt sat remained in low-mid 80's. Pt placed on BiPAP 18/10 R 15 FiO2 100%. Pt sat maintained mid-high 80's. Pt placed on AVAPS R 15 Ti 0.85 FiO2 100% EPAP 10 min pressures 11 max pressure 20 Vt 500 pt sat maintained high 80's low 90's. Pt stated she could breathe better. Will continue to pt monitor closely

## 2018-03-19 NOTE — ED Notes (Signed)
Date and time results received: 03/19/18 1207 (use smartphrase ".now" to insert current time)  Test: troponin Critical Value: 0.09  Name of Provider Notified: Dr Alfred Levins  Orders Received? Or Actions Taken?: Orders Received - See Orders for details

## 2018-03-20 ENCOUNTER — Inpatient Hospital Stay
Admit: 2018-03-20 | Discharge: 2018-03-20 | Disposition: A | Discharge status: Home / Self Care | Payer: PPO | Attending: Critical Care Medicine | Admitting: Critical Care Medicine

## 2018-03-20 ENCOUNTER — Encounter: Payer: Self-pay | Admitting: Vascular Surgery

## 2018-03-20 DIAGNOSIS — J9601 Acute respiratory failure with hypoxia: Secondary | ICD-10-CM

## 2018-03-20 LAB — BASIC METABOLIC PANEL
Anion gap: 7 (ref 5–15)
BUN: 24 mg/dL — ABNORMAL HIGH (ref 6–20)
CO2: 24 mmol/L (ref 22–32)
Calcium: 8.2 mg/dL — ABNORMAL LOW (ref 8.9–10.3)
Chloride: 110 mmol/L (ref 98–111)
Creatinine, Ser: 1.17 mg/dL — ABNORMAL HIGH (ref 0.44–1.00)
GFR calc Af Amer: 59 mL/min — ABNORMAL LOW (ref >60)
GFR calc non Af Amer: 51 mL/min — ABNORMAL LOW (ref >60)
Glucose, Bld: 262 mg/dL — ABNORMAL HIGH (ref 70–99)
Potassium: 3.9 mmol/L (ref 3.5–5.1)
Sodium: 141 mmol/L (ref 135–145)

## 2018-03-20 LAB — CBC
HCT: 38.2 % (ref 36.0–46.0)
Hemoglobin: 12.1 g/dL (ref 12.0–15.0)
MCH: 28 pg (ref 26.0–34.0)
MCHC: 31.7 g/dL (ref 30.0–36.0)
MCV: 88.4 fL (ref 80.0–100.0)
Platelets: 198 10*3/uL (ref 150–400)
RBC: 4.32 MIL/uL (ref 3.87–5.11)
RDW: 13.8 % (ref 11.5–15.5)
WBC: 9.4 10*3/uL (ref 4.0–10.5)
nRBC: 0 % (ref 0.0–0.2)

## 2018-03-20 LAB — GLUCOSE, CAPILLARY
Glucose-Capillary: 155 mg/dL — ABNORMAL HIGH (ref 70–99)
Glucose-Capillary: 163 mg/dL — ABNORMAL HIGH (ref 70–99)
Glucose-Capillary: 166 mg/dL — ABNORMAL HIGH (ref 70–99)
Glucose-Capillary: 174 mg/dL — ABNORMAL HIGH (ref 70–99)
Glucose-Capillary: 184 mg/dL — ABNORMAL HIGH (ref 70–99)
Glucose-Capillary: 215 mg/dL — ABNORMAL HIGH (ref 70–99)
Glucose-Capillary: 294 mg/dL — ABNORMAL HIGH (ref 70–99)

## 2018-03-20 LAB — ECHOCARDIOGRAM COMPLETE
Height: 65 in
Weight: 5485.04 oz

## 2018-03-20 LAB — HEPARIN LEVEL (UNFRACTIONATED)
Heparin Unfractionated: 0.47 IU/mL (ref 0.30–0.70)
Heparin Unfractionated: 0.58 IU/mL (ref 0.30–0.70)

## 2018-03-20 LAB — TROPONIN I
Troponin I: 0.65 ng/mL (ref <0.03)
Troponin I: 0.45 ng/mL (ref <0.03)

## 2018-03-20 LAB — HEMOGLOBIN A1C
Hgb A1c MFr Bld: 10.5 % — ABNORMAL HIGH (ref 4.8–5.6)
Mean Plasma Glucose: 254.65 mg/dL

## 2018-03-20 MED ORDER — ORAL CARE MOUTH RINSE
15.0000 mL | Freq: Two times a day (BID) | OROMUCOSAL | Status: DC
  Administered 2018-03-20: 15 mL via OROMUCOSAL

## 2018-03-20 MED ORDER — CHLORHEXIDINE GLUCONATE 0.12 % MT SOLN
15.0000 mL | Freq: Two times a day (BID) | OROMUCOSAL | Status: DC
  Administered 2018-03-20 – 2018-03-21 (×3): 15 mL via OROMUCOSAL
  Filled 2018-03-20 (×3): qty 15

## 2018-03-20 MED ORDER — BLISTEX MEDICATED EX OINT
TOPICAL_OINTMENT | CUTANEOUS | Status: DC | PRN
  Filled 2018-03-20: qty 6.3

## 2018-03-20 MED ORDER — PERFLUTREN LIPID MICROSPHERE
1.0000 mL | INTRAVENOUS | Status: AC | PRN
  Administered 2018-03-20: 2 mL via INTRAVENOUS
  Filled 2018-03-20: qty 10

## 2018-03-20 MED ORDER — MORPHINE SULFATE (PF) 2 MG/ML IV SOLN
1.0000 mg | INTRAVENOUS | Status: DC | PRN
  Administered 2018-03-20 – 2018-03-21 (×5): 2 mg via INTRAVENOUS
  Filled 2018-03-20 (×5): qty 1

## 2018-03-20 NOTE — Progress Notes (Signed)
ANTICOAGULATION CONSULT NOTE - Initial Consult  Pharmacy Consult for Heparin Indication: pulmonary embolus  Allergies  Allergen Reactions  . Propoxyphene N-Acetaminophen Palpitations    Patient Measurements: Height: 5\' 5"  (165.1 cm) Weight: (!) 342 lb 13 oz (155.5 kg) IBW/kg (Calculated) : 57 Heparin Dosing Weight: 96.135 kg  Vital Signs: Temp: 99.2 F (37.3 C) (12/10 0153) Temp Source: Axillary (12/10 0153) BP: 98/73 (12/10 0415) Pulse Rate: 80 (12/10 0000)  Labs: Recent Labs    03/19/18 1135 03/19/18 1426 03/19/18 1637 03/19/18 1903 03/20/18 0023 03/20/18 0147 03/20/18 0446  HGB 13.0  --   --   --   --  12.1  --   HCT 41.8  --   --   --   --  38.2  --   PLT 199  --   --   --   --  198  --   APTT  --  117* 41*  --   --   --   --   LABPROT  --  14.3  --   --   --   --   --   INR  --  1.12  --   --   --   --   --   HEPARINUNFRC  --   --   --   --  0.47  --  0.58  CREATININE 1.18*  --   --   --   --  1.Maria*  --   TROPONINI 0.09*  --   --  0.76*  --  0.65*  --     Estimated Creatinine Clearance: 79.8 mL/min (A) (by C-G formula based on SCr of 1.Maria mg/dL (H)).   Medical History: Past Medical History:  Diagnosis Date  . Allergic rhinitis   . Anemia    1980's  . Anxiety   . Carpal tunnel syndrome, left    pt denies  . Depression   . Diabetes mellitus    has not taken medications since 07/2016 after bariatric surgery  . DUB (dysfunctional uterine bleeding)   . Dysrhythmia    tachycardic  . External hemorrhoid   . GERD (gastroesophageal reflux disease)   . Gout   . History of hiatal hernia   . History of kidney stones   . Hypertension   . Morbid obesity ([place])   . Obesity   . Osteoarthritis, knee   . Pneumonia   . Recurrent UTI   . Rotator cuff syndrome of left shoulder     Medications:  Scheduled:  . atorvastatin  20 mg Oral QHS  . benazepril  40 mg Oral Daily  . chlorhexidine  15 mL Mouth Rinse BID  . docusate sodium  100 mg Oral BID  .  fentaNYL      . insulin aspart  0-15 Units Subcutaneous Q4H  . insulin glargine  10 Units Subcutaneous QHS  . mouth rinse  15 mL Mouth Rinse q12n4p  . midazolam      . pantoprazole  40 mg Oral Daily   Infusions:  . DOPamine 5 mcg/kg/min (03/20/18 0400)  . heparin 1,350 Units/hr (03/19/18 1741)   PRN:   Assessment: 57 year old [name] presented to the ED with chest pain. CT scan positive for a PE. Pharmacy consulted for heparin dosing. No DOAC PTA. Pt underwent pulmonary thrombectomy and received tPA. Heparin was stopped after tPA was given and aPTT level was obtain about 30 minutes later. APTT was elevated at 117 and heparin was held and another aPTT  was ordered 1 hour later. The aPTT returned at 41. We will restart the heparin gtt now that the aPTT is < 80 seconds. Will restart at ~14 unit/kg/hr rate. Goal heparin level for the next 24 hours is 0.3 - 0.5 then 0.3 to 0.7 thereafter.  Goal of Therapy:  Heparin level 0.3-0.5 units/ml for 24 hours then 0.3 to 0.7 thereafter.  Monitor platelets by anticoagulation protocol: Yes   Plan:  12/10 @ 0500 HL 0.58 therapeutic. Will continue current rate and will recheck w/ am labs. CBC stable will continue to monitor.  Tobie Lords, PharmD, BCPS 03/20/2018,5:35 AM

## 2018-03-20 NOTE — Progress Notes (Signed)
[facility] [place] NAME: Maria [name]    MR#:  [id number]  DATE OF BIRTH:  May 04, 1960  SUBJECTIVE:   Patient feels SOB has much improved after mechanical thrombectomy/  REVIEW OF SYSTEMS:    Review of Systems  Constitutional: Negative for fever, chills weight loss HENT: Negative for ear pain, nosebleeds, congestion, facial swelling, rhinorrhea, neck pain, neck stiffness and ear discharge.   Respiratory: Negative for cough, ++ shortness of breath (better) NO wheezing  Cardiovascular: Negative for chest pain, palpitations and leg swelling.  Gastrointestinal: Negative for heartburn, abdominal pain, vomiting, diarrhea or consitpation Genitourinary: Negative for dysuria, urgency, frequency, hematuria Musculoskeletal: Negative for back pain or joint pain Neurological: Negative for dizziness, seizures, syncope, focal weakness,  numbness and headaches.  Hematological: Does not bruise/bleed easily.  Psychiatric/Behavioral: Negative for hallucinations, confusion, dysphoric mood    Tolerating Diet:yes      DRUG ALLERGIES:   Allergies  Allergen Reactions  . Propoxyphene N-Acetaminophen Palpitations    VITALS:  Blood pressure 93/64, pulse 86, temperature 97.7 F (36.5 C), temperature source Oral, resp. rate 14, height 5\' 5"  (1.651 m), weight (!) 155.5 kg, last menstrual period 04/11/2009, SpO2 95 %.  PHYSICAL EXAMINATION:  Constitutional: Appears well-developed and well-nourished. No distress. HENT: Normocephalic. Marland Kitchen Oropharynx is clear and moist.  Eyes: Conjunctivae and EOM are normal. PERRLA, no scleral icterus.  Neck: Normal ROM. Neck supple. No JVD. No tracheal deviation. CVS: RRR, S1/S2 +, no murmurs, no gallops, no carotid bruit.  Pulmonary: Effort and breath sounds normal, no stridor, rhonchi, wheezes, rales.  Abdominal: Soft. BS +,  no distension, tenderness, rebound or guarding.  Musculoskeletal: Normal range of motion. No  edema and no tenderness.  Neuro: Alert. CN 2-12 grossly intact. No focal deficits. Skin: Skin is warm and dry. No rash noted. Psychiatric: Normal mood and affect.      LABORATORY PANEL:   CBC Recent Labs  Lab 03/20/18 0147  WBC 9.4  HGB 12.1  HCT 38.2  PLT 198   ------------------------------------------------------------------------------------------------------------------  Chemistries  Recent Labs  Lab 03/20/18 0147  NA 141  K 3.9  CL 110  CO2 24  GLUCOSE 262*  BUN 24*  CREATININE 1.17*  CALCIUM 8.2*   ------------------------------------------------------------------------------------------------------------------  Cardiac Enzymes Recent Labs  Lab 03/19/18 1903 03/20/18 0147 03/20/18 0655  TROPONINI 0.76* 0.65* 0.45*   ------------------------------------------------------------------------------------------------------------------  RADIOLOGY:  Ct Angio Chest Pe W And/or Wo Contrast  Result Date: 03/19/2018 CLINICAL DATA:  Chest pain since 9 a.m. this morning. EXAM: CT ANGIOGRAPHY CHEST WITH CONTRAST TECHNIQUE: Multidetector CT imaging of the chest was performed using the standard protocol during bolus administration of intravenous contrast. Multiplanar CT image reconstructions and MIPs were obtained to evaluate the vascular anatomy. CONTRAST:  75 mL OMNIPAQUE IOHEXOL 350 MG/ML SOLN COMPARISON:  Single-view of the chest 03/19/2018. FINDINGS: Cardiovascular: The patient has extensive bilateral pulmonary emboli. Clot is seen interlobar branches bilaterally, more blunted on the right. Right ventricular diameter is 5.6 cm and left ventricular diameter is 2.2 cm for and RV to LV ratio of 2.5 consistent with right heart strain. The interventricular septum is bowed to the left. Heart size is enlarged. No pericardial effusion. No atherosclerosis. Mediastinum/Nodes: No lymphadenopathy. 1.2 cm low attenuating lesion right lobe of the thyroid incidentally noted. The  esophagus appears normal. Cardiomegaly. Lungs/Pleura: No pleural effusion.  The lungs are clear. Upper Abdomen: No acute abnormality. The liver is somewhat low attenuating consistent with fatty infiltration. Musculoskeletal: No acute or  focal abnormality Review of the MIP images confirms the above findings. IMPRESSION: Positive for acute PE with CT evidence of right heart strain (RV/LV Ratio = 2.5) consistent with at least submassive (intermediate risk) PE. The presence of right heart strain has been associated with an increased risk of morbidity and mortality. Please activate Code PE by paging [phone]. Small hiatal hernia. Cardiomegaly. Critical Value/emergent results were called by telephone at the time of interpretation on 03/19/2018 at 12:05 pm to Dr. Quentin Cornwall, who verbally acknowledged these results. Electronically Signed   By: Inge Rise M.D.   On: 03/19/2018 12:08   Dg Chest Portable 1 View  Result Date: 03/19/2018 CLINICAL DATA:  Chest pain.  Hypoxia. EXAM: PORTABLE CHEST 1 VIEW COMPARISON:  03/15/2017 FINDINGS: The heart size and mediastinal contours are within normal limits. Both lungs are clear. The visualized skeletal structures are unremarkable. IMPRESSION: No active disease. Electronically Signed   By: Lorriane Shire M.D.   On: 03/19/2018 11:39     ASSESSMENT AND PLAN:   57 year old female with a history of diabetes who presented to the emergency room due to shortness of breath.  1.  Acute hypoxic respiratory failure due to submassive pulmonary emboli with associated right heart strain status post thrombectomy: Respiratory wise patient has stabilized.   2.  Submassive pulmonary emboli status post thrombectomy: Continue anticoagulation Follow-up on echocardiogram Can consider changing to Eliquis.   3.  Cardiogenic shock due to problem #2: Wean dopamine as tolerated Follow-up on echocardiogram Elevation in troponin is due to demand ischemia from pulmonary emboli. 4.   Diabetes: Continue Lantus with sliding scale and ADA diet  5.  Hypertension: Continue Benzapril  6.  Hyperlipidemia: Continue statin Management plans discussed with the patient and she is in agreement.  CODE STATUS: FULL  TOTAL TIME TAKING CARE OF THIS PATIENT: 30 minutes.     POSSIBLE D/C 2 days, DEPENDING ON CLINICAL CONDITION.   Mistie Adney M.D on 03/20/2018 at 12:51 PM  Between 7am to 6pm - Pager - [phone] After 6pm go to [email or web address] - password [email or web address] [facility]  Office  [phone]  CC: Primary care physician; Elenore Paddy, FNP  Note: This dictation was prepared with Dragon dictation along with smaller phrase technology. Any transcriptional errors that result from this process are unintentional.

## 2018-03-20 NOTE — Progress Notes (Signed)
Patient's daughter Lajean Saver, took patient's medication home.

## 2018-03-20 NOTE — Progress Notes (Signed)
Follow up - Critical Care Medicine Note  Patient Details:    Maria [name] is an 57 y.o. female. T58-year-old morbidly obese woman, lifelong never smoker who presented with submassive PE and right heart strain with associated cardiogenic shock.  She has responded well to thrombolysis, mechanical thrombectomy and subsequent anticoagulation.  She is also on supplemental oxygen after a brief period on BiPAP and requiring low-dose dopamine which is being weaned off. Lines, Airways, Drains: CVC Triple Lumen 03/19/18 Right Internal jugular (Active)  Indication for Insertion or Continuance of Line Vasoactive infusions 03/20/2018  8:00 AM  Site Assessment Clean;Dry;Intact 03/20/2018  8:00 AM  Proximal Lumen Status Infusing 03/20/2018  8:00 AM  Medial Lumen Status Flushed;Saline locked;Blood return noted 03/20/2018  8:00 AM  Distal Lumen Status Flushed;Saline locked;Blood return noted 03/20/2018  8:00 AM  Dressing Type Transparent 03/20/2018  8:00 AM  Dressing Status Clean;Dry;Intact;Antimicrobial disc in place 03/20/2018  8:00 AM  Line Care Connections checked and tightened 03/20/2018  8:00 AM  Dressing Intervention New dressing;Dressing changed;Antimicrobial disc changed 03/20/2018 11:00 AM  Dressing Change Due 03/27/18 03/20/2018 11:00 AM     External Urinary Catheter (Active)  Collection Container Dedicated Suction Canister 03/20/2018  8:00 AM  Securement Method Securing device (Describe) 03/19/2018  4:39 PM  Intervention Equipment Changed 03/20/2018  2:00 AM  Output (mL) 500 mL 03/20/2018  5:00 AM    Anti-infectives:  Anti-infectives (From admission, onward)   Start     Dose/Rate Route Frequency Ordered Stop   03/19/18 1253  ceFAZolin (ANCEF) IVPB 2g/100 mL premix     2 g 200 mL/hr over 30 Minutes Intravenous 30 min pre-op 03/19/18 1253 03/19/18 1409      Microbiology: Results for orders placed or performed during the hospital encounter of 03/19/18  MRSA PCR Screening     Status: None    Collection Time: 03/19/18  4:44 PM  Result Value Ref Range Status   MRSA by PCR NEGATIVE NEGATIVE Final    Comment:        The GeneXpert MRSA Assay (FDA approved for NASAL specimens only), is one component of a comprehensive MRSA colonization surveillance program. It is not intended to diagnose MRSA infection nor to guide or monitor treatment for MRSA infections. Performed at [facility], [place], [place] [address]     Best Practice/Protocols:  VTE Prophylaxis: Heparin (drip) N/A  Events: Mechanical thrombectomy yesterday by Dr. Lucky Cowboy Studies: Ct Angio Chest Pe W And/or Wo Contrast  Result Date: 03/19/2018 CLINICAL DATA:  Chest pain since 9 a.m. this morning. EXAM: CT ANGIOGRAPHY CHEST WITH CONTRAST TECHNIQUE: Multidetector CT imaging of the chest was performed using the standard protocol during bolus administration of intravenous contrast. Multiplanar CT image reconstructions and MIPs were obtained to evaluate the vascular anatomy. CONTRAST:  75 mL OMNIPAQUE IOHEXOL 350 MG/ML SOLN COMPARISON:  Single-view of the chest 03/19/2018. FINDINGS: Cardiovascular: The patient has extensive bilateral pulmonary emboli. Clot is seen interlobar branches bilaterally, more blunted on the right. Right ventricular diameter is 5.6 cm and left ventricular diameter is 2.2 cm for and RV to LV ratio of 2.5 consistent with right heart strain. The interventricular septum is bowed to the left. Heart size is enlarged. No pericardial effusion. No atherosclerosis. Mediastinum/Nodes: No lymphadenopathy. 1.2 cm low attenuating lesion right lobe of the thyroid incidentally noted. The esophagus appears normal. Cardiomegaly. Lungs/Pleura: No pleural effusion.  The lungs are clear. Upper Abdomen: No acute abnormality. The liver is somewhat low attenuating consistent with fatty infiltration.  Musculoskeletal: No acute or focal abnormality Review of the MIP images confirms the above findings.  IMPRESSION: Positive for acute PE with CT evidence of right heart strain (RV/LV Ratio = 2.5) consistent with at least submassive (intermediate risk) PE. The presence of right heart strain has been associated with an increased risk of morbidity and mortality. Please activate Code PE by paging [phone]. Small hiatal hernia. Cardiomegaly. Critical Value/emergent results were called by telephone at the time of interpretation on 03/19/2018 at 12:05 pm to Dr. Quentin Cornwall, who verbally acknowledged these results. Electronically Signed   By: Inge Rise M.D.   On: 03/19/2018 12:08   Dg Chest Portable 1 View  Result Date: 03/19/2018 CLINICAL DATA:  Chest pain.  Hypoxia. EXAM: PORTABLE CHEST 1 VIEW COMPARISON:  03/15/2017 FINDINGS: The heart size and mediastinal contours are within normal limits. Both lungs are clear. The visualized skeletal structures are unremarkable. IMPRESSION: No active disease. Electronically Signed   By: Lorriane Shire M.D.   On: 03/19/2018 11:39    Consults: Treatment Team:  Pccm, Ander Gaster, MD   Subjective:    Overnight Issues:  Overnight, the patient stabilized nicely.  Now weaned off of BiPAP and on 4 L nasal cannula O2 with saturations between 93 to 94%.  Dopamine down to 2 mcg/min. 2D echo performed and pending.  Objective:  Vital signs for last 24 hours: Temp:  [97.7 F (36.5 C)-99.2 F (37.3 C)] 97.7 F (36.5 C) (12/10 0830) Pulse Rate:  [73-120] 86 (12/10 1130) Resp:  [0-28] 14 (12/10 1130) BP: (93-135)/(61-115) 93/64 (12/10 1130) SpO2:  [66 %-98 %] 95 % (12/10 1130) Weight:  [155.5 kg] 155.5 kg (12/10 0452)  Hemodynamic parameters for last 24 hours:  Hemodynamics have improved on pressors now being weaned off.  Intake/Output from previous day: 12/09 0701 - 12/10 0700 In: 360.4 [I.V.:360.4] Out: 500 [Urine:500]  Intake/Output this shift: Total I/O In: 105.8 [I.V.:105.8] Out: -   Vent settings for last 24 hours:  Off of BiPAP.  Physical Exam:   General: alert, no respiratory distress. HEENT: Conjunctiva normal.  PERRLA.  No scleral icterus. Neck: Supple, right IJ CVL intact, dressing clean.  Trachea midline. Lungs: Clear to auscultation bilaterally. Cardiac: Regular rate rhythm, no rubs murmurs gallops heard. Abdomen: Obese, soft, normoactive bowel sounds. Extremities: Trace edema bilaterally.  Intact distal pulses. Skin: No rashes or petechiae noted. Neuro: Awake and alert, no focal deficits noted.  Conversant. Psych: Jovial, mood is appropriate.   Assessment/Plan:    LOS: 1 day   1.  Acute hypoxemic respiratory failure due to submassive PE with associated right heart strain.  Continue oxygen supplementation and wean off as tolerated, she is down to 4 L nasal cannula O2.  The perceived hypoxemia on oximetry may have been related to low flow state due to RV strain. Continue to monitor.  2.  Submassive PE: She will remain on anticoagulation.  She has received thrombolysis and mechanical thrombectomy.  3.  Right ventricular strain with associated cardiogenic shock due to #2 above, supportive care.  Is responding well to IV fluids, she will need higher filling pressures due to right ventricular strain.  Dopamine added with resolution of her hypotension and improvement of blood flow.  Dopamine now being weaned off.   2D echo performed and pending.    Troponins had very mild elevation likely due to right ventricular strain.  4.  Diabetes mellitus in poor control.  Will institute sliding scale.  She may need insulin infusion.  5.  Super  morbid obesity with BMI of 56, this issue adds complexity to her management.  She will benefit ultimately from weight loss.    Critical Care Total Time*: Level 3 follow-up.  Vernard Gambles 03/20/2018  *Care during the described time interval was provided by me and/or other providers on the critical care team.  I have reviewed this patient's available data, including medical history, events  of note, physical examination and test results as part of my evaluation.

## 2018-03-20 NOTE — Progress Notes (Signed)
*  PRELIMINARY RESULTS* Echocardiogram 2D Echocardiogram has been performed.  Maria [name] 03/20/2018, 12:45 PM

## 2018-03-20 NOTE — Progress Notes (Signed)
ANTICOAGULATION CONSULT NOTE - Initial Consult  Pharmacy Consult for Heparin Indication: pulmonary embolus  Allergies  Allergen Reactions  . Propoxyphene N-Acetaminophen Palpitations    Patient Measurements: Height: 5\' 5"  (165.1 cm) Weight: (!) 340 lb (154.2 kg) IBW/kg (Calculated) : 57 Heparin Dosing Weight: 96.135 kg  Vital Signs: Temp: 98.1 F (36.7 C) (12/09 1947) Temp Source: Oral (12/09 1947) BP: 104/76 (12/10 0100) Pulse Rate: 80 (12/10 0000)  Labs: Recent Labs    03/19/18 1135 03/19/18 1426 03/19/18 1637 03/19/18 1903 03/20/18 0023  HGB 13.0  --   --   --   --   HCT 41.8  --   --   --   --   PLT 199  --   --   --   --   APTT  --  117* 41*  --   --   LABPROT  --  14.3  --   --   --   INR  --  1.12  --   --   --   HEPARINUNFRC  --   --   --   --  0.47  CREATININE 1.18*  --   --   --   --   TROPONINI 0.09*  --   --  0.76*  --     Estimated Creatinine Clearance: 78.7 mL/min (A) (by C-G formula based on SCr of 1.18 mg/dL (H)).   Medical History: Past Medical History:  Diagnosis Date  . Allergic rhinitis   . Anemia    1980's  . Anxiety   . Carpal tunnel syndrome, left    pt denies  . Depression   . Diabetes mellitus    has not taken medications since 07/2016 after bariatric surgery  . DUB (dysfunctional uterine bleeding)   . Dysrhythmia    tachycardic  . External hemorrhoid   . GERD (gastroesophageal reflux disease)   . Gout   . History of hiatal hernia   . History of kidney stones   . Hypertension   . Morbid obesity ([place])   . Obesity   . Osteoarthritis, knee   . Pneumonia   . Recurrent UTI   . Rotator cuff syndrome of left shoulder     Medications:  Scheduled:  . atorvastatin  20 mg Oral QHS  . benazepril  40 mg Oral Daily  . chlorhexidine  15 mL Mouth Rinse BID  . docusate sodium  100 mg Oral BID  . fentaNYL      . insulin aspart  0-15 Units Subcutaneous Q4H  . insulin glargine  10 Units Subcutaneous QHS  . mouth rinse  15 mL Mouth  Rinse q12n4p  . midazolam      . pantoprazole  40 mg Oral Daily   Infusions:  . DOPamine 5 mcg/kg/min (03/20/18 0000)  . heparin 1,350 Units/hr (03/19/18 1741)   PRN:   Assessment: 57 year old women presented to the ED with chest pain. CT scan positive for a PE. Pharmacy consulted for heparin dosing. No DOAC PTA. Pt underwent pulmonary thrombectomy and received tPA. Heparin was stopped after tPA was given and aPTT level was obtain about 30 minutes later. APTT was elevated at 117 and heparin was held and another aPTT was ordered 1 hour later. The aPTT returned at 41. We will restart the heparin gtt now that the aPTT is < 80 seconds. Will restart at ~14 unit/kg/hr rate. Goal heparin level for the next 24 hours is 0.3 - 0.5 then 0.3 to 0.7 thereafter.  Goal of Therapy:  Heparin level 0.3-0.5 units/ml for 24 hours then 0.3 to 0.7 thereafter.  Monitor platelets by anticoagulation protocol: Yes   Plan:  12/10 @ 0023 HL 0.47 therapeutic. Will continue current rate and will recheck w/ am labs. Will f/u am cbc.  Tobie Lords, PharmD, BCPS 03/20/2018,1:49 AM

## 2018-03-21 DIAGNOSIS — Z9889 Other specified postprocedural states: Secondary | ICD-10-CM

## 2018-03-21 LAB — GLUCOSE, CAPILLARY
Glucose-Capillary: 149 mg/dL — ABNORMAL HIGH (ref 70–99)
Glucose-Capillary: 121 mg/dL — ABNORMAL HIGH (ref 70–99)
Glucose-Capillary: 154 mg/dL — ABNORMAL HIGH (ref 70–99)
Glucose-Capillary: 144 mg/dL — ABNORMAL HIGH (ref 70–99)
Glucose-Capillary: 129 mg/dL — ABNORMAL HIGH (ref 70–99)
Glucose-Capillary: 152 mg/dL — ABNORMAL HIGH (ref 70–99)

## 2018-03-21 LAB — BASIC METABOLIC PANEL
Anion gap: 9 (ref 5–15)
BUN: 18 mg/dL (ref 6–20)
CO2: 23 mmol/L (ref 22–32)
Calcium: 8.5 mg/dL — ABNORMAL LOW (ref 8.9–10.3)
Chloride: 108 mmol/L (ref 98–111)
Creatinine, Ser: 0.87 mg/dL (ref 0.44–1.00)
GFR calc Af Amer: >60 mL/min (ref >60)
GFR calc non Af Amer: >60 mL/min (ref >60)
Glucose, Bld: 144 mg/dL — ABNORMAL HIGH (ref 70–99)
Potassium: 3.4 mmol/L — ABNORMAL LOW (ref 3.5–5.1)
Sodium: 140 mmol/L (ref 135–145)

## 2018-03-21 LAB — CBC
HCT: 36.5 % (ref 36.0–46.0)
Hemoglobin: 11.3 g/dL — ABNORMAL LOW (ref 12.0–15.0)
MCH: 27.4 pg (ref 26.0–34.0)
MCHC: 31 g/dL (ref 30.0–36.0)
MCV: 88.6 fL (ref 80.0–100.0)
Platelets: 201 10*3/uL (ref 150–400)
RBC: 4.12 MIL/uL (ref 3.87–5.11)
RDW: 14.1 % (ref 11.5–15.5)
WBC: 7.6 10*3/uL (ref 4.0–10.5)
nRBC: 0 % (ref 0.0–0.2)

## 2018-03-21 LAB — HEPARIN LEVEL (UNFRACTIONATED): Heparin Unfractionated: 0.62 IU/mL (ref 0.30–0.70)

## 2018-03-21 LAB — HIV ANTIBODY (ROUTINE TESTING W REFLEX): HIV Screen 4th Generation wRfx: NONREACTIVE

## 2018-03-21 MED ORDER — APIXABAN 5 MG PO TABS: 5.0000 mg | ORAL_TABLET | Freq: Two times a day (BID) | ORAL | Status: DC

## 2018-03-21 MED ORDER — OXYCODONE-ACETAMINOPHEN 5-325 MG PO TABS
1.0000 | ORAL_TABLET | ORAL | Status: DC | PRN
  Administered 2018-03-21: 2 via ORAL
  Filled 2018-03-21: qty 2

## 2018-03-21 MED ORDER — POTASSIUM CHLORIDE CRYS ER 20 MEQ PO TBCR
40.0000 meq | EXTENDED_RELEASE_TABLET | Freq: Once | ORAL | Status: AC
  Administered 2018-03-21: 40 meq via ORAL
  Filled 2018-03-21: qty 2

## 2018-03-21 MED ORDER — INSULIN ASPART 100 UNIT/ML ~~LOC~~ SOLN
0.0000 [IU] | Freq: Three times a day (TID) | SUBCUTANEOUS | Status: DC
  Administered 2018-03-21: 3 [IU] via SUBCUTANEOUS
  Administered 2018-03-21: 2 [IU] via SUBCUTANEOUS
  Administered 2018-03-22: 3 [IU] via SUBCUTANEOUS
  Filled 2018-03-21 (×3): qty 1

## 2018-03-21 MED ORDER — APIXABAN 5 MG PO TABS
10.0000 mg | ORAL_TABLET | Freq: Two times a day (BID) | ORAL | Status: DC
  Administered 2018-03-21 – 2018-03-22 (×3): 10 mg via ORAL
  Filled 2018-03-21 (×3): qty 2

## 2018-03-21 NOTE — Progress Notes (Signed)
Inpatient Diabetes Program Recommendations  AACE/ADA: New Consensus Statement on Inpatient Glycemic Control (2019)  Target Ranges:  Prepandial:   less than 140 mg/dL      Peak postprandial:   less than 180 mg/dL (1-2 hours)      Critically ill patients:  140 - 180 mg/dL  Results for Maria [name], Maria [name] (MRN [id number]) as of 03/21/2018 14:34  Ref. Range 03/20/2018 07:33 03/20/2018 11:15 03/20/2018 16:43 03/20/2018 19:22 03/20/2018 23:36 03/21/2018 03:26 03/21/2018 07:20 03/21/2018 11:30  Glucose-Capillary Latest Ref Range: 70 - 99 mg/dL 184 (H) 174 (H) 155 (H) 166 (H) 163 (H) 149 (H) 121 (H) 154 (H)   Results for Maria [name], Maria [name] (MRN [id number]) as of 03/21/2018 14:34  Ref. Range 06/21/2017 10:45 03/20/2018 01:47  Hemoglobin A1C Latest Ref Range: 4.8 - 5.6 % 11.7 (H) 10.5 (H)   Review of Glycemic Control  Diabetes history: DM2 Outpatient Diabetes medications: Glipizide 5 mg daily, Metformin 1000 mg BID, Lantus 10 units QHS, has Ozempic at home but not started yet Current orders for Inpatient glycemic control: Lantus 10 units QHS, Novolog 0-15 units Q4H  Inpatient Diabetes Program Recommendations:  HgbA1C: A1C 10.5% on 03/20/18 indicating an average glucose of 255 mg/dl.  NOTE: Spoke with patient about diabetes and home regimen for diabetes control. Patient reports that she use to take medication for DM but after her bariatric surgery she was able to come off all DM medications.  Patient reports that she needed surgery earlier this year and it was noted that her A1C was elevated and she needed to get DM controlled before surgery could be done so she began seeing her PCP for diabetes management. Patient reports that she has been back on DM medications for at least 4 months and she notes she is taking Lantus 10 units QHS , Glipizide 5 mg daily, and Metformin 1000 mg BID as an outpatient for diabetes control. Patient states that her PCP gave her Ozempic to start at her last visit. However, patient states  that she can not see the pen numbers well so she has not started taking it yet because she is unsure about what to give. Patient states taht once she starts Ozempic she is suppose to stop taking the Lantus.  Discussed Ozempic and asked patient to have a family member look at the [place] pen with her and let her know how many clicks she needs to turn the dial to in order to take the prescribed dose her PCP has asked her to start on. Patient reports that she has not been checking her glucose because her glucometer is broken. Patient will need Rx for new glucometer and testing supplies at time of discharge.  Inquired about prior A1C and patient reports that her last A1C value was in the 10% range 1 month ago. Discussed A1C results (10.5% on 03/20/2018) and explained that her current A1C indicates an average glucose of 255 mg/dl over the past 2-3 months. Discussed glucose and A1C goals. Discussed importance of checking CBGs and maintaining good CBG control to prevent long-term and short-term complications. Explained how hyperglycemia leads to damage within blood vessels which lead to the common complications seen with uncontrolled diabetes. Stressed to the patient the importance of improving glycemic control to prevent further complications from uncontrolled diabetes. Discussed impact of nutrition, exercise, stress, sickness, and medications on diabetes control.Patient admits that she loves sweets and she has reverted back to old habits of drinking regular sodas and eating more sweets. Encouraged patient to eliminate sugary beverages from  her diet and to limit intake of sweets.   Encouraged patient to check her glucose 3-4 times per day (before meals and at bedtime) and to keep a log book of glucose readings which she will need to take to doctor appointments. Patient states that she plans to get back on track with her DM and do what she needs to do in order to get it under control.  Patient verbalized understanding of  information discussed and she states that she has no further questions at this time related to diabetes.  Thanks, Barnie Alderman, RN, MSN, CDE Diabetes Coordinator Inpatient Diabetes Program [phone] (Team Pager)

## 2018-03-21 NOTE — Progress Notes (Signed)
ANTICOAGULATION CONSULT NOTE - Initial Consult  Pharmacy Consult for Heparin Indication: pulmonary embolus  Allergies  Allergen Reactions  . Propoxyphene N-Acetaminophen Palpitations    Patient Measurements: Height: 5\' 5"  (165.1 cm) Weight: (!) 345 lb 3.9 oz (156.6 kg) IBW/kg (Calculated) : 57 Heparin Dosing Weight: 96.135 kg  Vital Signs: Temp: 98.2 F (36.8 C) (12/11 0200) Temp Source: Oral (12/11 0200) BP: 116/76 (12/11 0600) Pulse Rate: 74 (12/11 0600)  Labs: Recent Labs    03/19/18 1135 03/19/18 1426 03/19/18 1637 03/19/18 1903 03/20/18 0023 03/20/18 0147 03/20/18 0446 03/20/18 0655 03/21/18 0514  HGB 13.0  --   --   --   --  12.1  --   --  11.3*  HCT 41.8  --   --   --   --  38.2  --   --  36.5  PLT 199  --   --   --   --  198  --   --  201  APTT  --  117* 41*  --   --   --   --   --   --   LABPROT  --  14.3  --   --   --   --   --   --   --   INR  --  1.12  --   --   --   --   --   --   --   HEPARINUNFRC  --   --   --   --  0.47  --  0.58  --  0.62  CREATININE 1.18*  --   --   --   --  1.17*  --   --  0.87  TROPONINI 0.09*  --   --  0.76*  --  0.65*  --  0.45*  --     Estimated Creatinine Clearance: 107.7 mL/min (by C-G formula based on SCr of 0.87 mg/dL).   Medical History: Past Medical History:  Diagnosis Date  . Allergic rhinitis   . Anemia    1980's  . Anxiety   . Carpal tunnel syndrome, left    pt denies  . Depression   . Diabetes mellitus    has not taken medications since 07/2016 after bariatric surgery  . DUB (dysfunctional uterine bleeding)   . Dysrhythmia    tachycardic  . External hemorrhoid   . GERD (gastroesophageal reflux disease)   . Gout   . History of hiatal hernia   . History of kidney stones   . Hypertension   . Morbid obesity ([place])   . Obesity   . Osteoarthritis, knee   . Pneumonia   . Recurrent UTI   . Rotator cuff syndrome of left shoulder     Medications:  Scheduled:  . atorvastatin  20 mg Oral QHS  .  benazepril  40 mg Oral Daily  . chlorhexidine  15 mL Mouth Rinse BID  . docusate sodium  100 mg Oral BID  . insulin aspart  0-15 Units Subcutaneous Q4H  . insulin glargine  10 Units Subcutaneous QHS  . mouth rinse  15 mL Mouth Rinse q12n4p  . pantoprazole  40 mg Oral Daily   Infusions:  . DOPamine Stopped (03/21/18 0408)  . heparin 1,350 Units/hr (03/21/18 0600)   PRN:   Assessment: 57 year old women presented to the ED with chest pain. CT scan positive for a PE. Pharmacy consulted for heparin dosing. No DOAC PTA. Pt underwent pulmonary thrombectomy and received  tPA. Heparin was stopped after tPA was given and aPTT level was obtain about 30 minutes later. APTT was elevated at 117 and heparin was held and another aPTT was ordered 1 hour later. The aPTT returned at 41. We will restart the heparin gtt now that the aPTT is < 80 seconds. Will restart at ~14 unit/kg/hr rate. Goal heparin level for the next 24 hours is 0.3 - 0.5 then 0.3 to 0.7 thereafter.  Goal of Therapy:  Heparin level 0.3-0.5 units/ml for 24 hours then 0.3 to 0.7 thereafter.  Monitor platelets by anticoagulation protocol: Yes   Plan:  12/10 @ 0500 HL 0.58 therapeutic. Will continue current rate and will recheck w/ am labs. CBC stable will continue to monitor.  12/11:  HL @ 0500 = 0.62.  Will continue pt on current rate and recheck HL on 12/12 with AM labs.   Orene Desanctis, PharmD

## 2018-03-21 NOTE — Treatment Plan (Signed)
Patient has remained hemodynamically stable overnight.  Off of dopamine.  Discussed during multidisciplinary rounds.  We will discontinue central line.  Patient will be switched from heparin to Eliquis.  We will transfer to telemetry.  CCM will sign off please reconsult as needed.  Renold Don, MD [facility]

## 2018-03-21 NOTE — Progress Notes (Signed)
[facility]  Daily Progress Note   Subjective  - 2 Days Post-Op  Patient doing well.  Off oxygen.  Going to the floor this afternoon.  Chest pain gone  Objective Vitals:   03/21/18 1030 03/21/18 1100 03/21/18 1130 03/21/18 1200  BP: 115/74 101/70 110/80 106/87  Pulse: 83 77 81 82  Resp: 12 12 16 12   Temp:      TempSrc:      SpO2: 99% 99% 97% 99%  Weight:      Height:        Intake/Output Summary (Last 24 hours) at 03/21/2018 1423 Last data filed at 03/21/2018 1405 Gross per 24 hour  Intake 992.89 ml  Output 1250 ml  Net -257.11 ml    PULM  CTAB CV  RRR VASC  Right jugular TLC in place  Laboratory CBC    Component Value Date/Time   WBC 7.6 03/21/2018 0514   HGB 11.3 (L) 03/21/2018 0514   HGB 11.9 (L) 04/23/2012 0317   HCT 36.5 03/21/2018 0514   HCT 38.5 04/23/2012 0317   PLT 201 03/21/2018 0514   PLT 371 04/23/2012 0317    BMET    Component Value Date/Time   NA 140 03/21/2018 0514   NA 139 04/23/2012 0317   K 3.4 (L) 03/21/2018 0514   K 4.2 04/23/2012 0751   CL 108 03/21/2018 0514   CL 103 04/23/2012 0317   CO2 23 03/21/2018 0514   CO2 28 04/23/2012 0317   GLUCOSE 144 (H) 03/21/2018 0514   GLUCOSE 290 (H) 04/23/2012 0317   BUN 18 03/21/2018 0514   BUN 30 (H) 04/23/2012 0317   CREATININE 0.87 03/21/2018 0514   CREATININE 1.14 04/23/2012 0317   CALCIUM 8.5 (L) 03/21/2018 0514   CALCIUM 8.7 04/23/2012 0317   GFRNONAA >60 03/21/2018 0514   GFRNONAA 55 (L) 04/23/2012 0317   GFRAA >60 03/21/2018 0514   GFRAA >60 04/23/2012 0317    Assessment/Planning: POD #2 s/p pulmonary thrombectomy/catheter directed thrombolysis   Doing very well now  To floor  Likely home tomorrow  Will need at least 6 months of anticoagulation as an outpatient and OK to transition to oral anticoagulants at this point  No other vascular recs at this time  Will sign off, please call with questions.     Leotis Pain  03/21/2018, 2:23  PM

## 2018-03-21 NOTE — Progress Notes (Signed)
[facility] [place] NAME: Maria [name]    MR#:  [id number]  DATE OF BIRTH:  10/20/1960  SUBJECTIVE:   Patient in upper chair and doing much better this morning.  Shortness of breath has improved.  No chest pain.  REVIEW OF SYSTEMS:    Review of Systems  Constitutional: Negative for fever, chills weight loss HENT: Negative for ear pain, nosebleeds, congestion, facial swelling, rhinorrhea, neck pain, neck stiffness and ear discharge.   Respiratory: Negative for cough, ++ shortness of breath (better) NO wheezing  Cardiovascular: Negative for chest pain, palpitations and leg swelling.  Gastrointestinal: Negative for heartburn, abdominal pain, vomiting, diarrhea or consitpation Genitourinary: Negative for dysuria, urgency, frequency, hematuria Musculoskeletal: Negative for back pain or joint pain Neurological: Negative for dizziness, seizures, syncope, focal weakness,  numbness and headaches.  Hematological: Does not bruise/bleed easily.  Psychiatric/Behavioral: Negative for hallucinations, confusion, dysphoric mood    Tolerating Diet:yes      DRUG ALLERGIES:   Allergies  Allergen Reactions  . Propoxyphene N-Acetaminophen Palpitations    VITALS:  Blood pressure 106/87, pulse 82, temperature 98 F (36.7 C), temperature source Oral, resp. rate 12, height 5\' 5"  (1.651 m), weight (!) 156.6 kg, last menstrual period 04/11/2009, SpO2 99 %.  PHYSICAL EXAMINATION:  Constitutional: Appears well-developed and well-nourished. No distress. HENT: Normocephalic. Marland Kitchen Oropharynx is clear and moist.  Eyes: Conjunctivae and EOM are normal. PERRLA, no scleral icterus.  Neck: Normal ROM. Neck supple. No JVD. No tracheal deviation. CVS: RRR, S1/S2 +, no murmurs, no gallops, no carotid bruit.  Pulmonary: Effort and breath sounds normal, no stridor, rhonchi, wheezes, rales.  Abdominal: Soft. BS +,  no distension, tenderness, rebound or guarding.   Musculoskeletal: Normal range of motion. No edema and no tenderness.  Neuro: Alert. CN 2-12 grossly intact. No focal deficits. Skin: Skin is warm and dry. No rash noted. Psychiatric: Normal mood and affect.      LABORATORY PANEL:   CBC Recent Labs  Lab 03/21/18 0514  WBC 7.6  HGB 11.3*  HCT 36.5  PLT 201   ------------------------------------------------------------------------------------------------------------------  Chemistries  Recent Labs  Lab 03/21/18 0514  NA 140  K 3.4*  CL 108  CO2 23  GLUCOSE 144*  BUN 18  CREATININE 0.87  CALCIUM 8.5*   ------------------------------------------------------------------------------------------------------------------  Cardiac Enzymes Recent Labs  Lab 03/19/18 1903 03/20/18 0147 03/20/18 0655  TROPONINI 0.76* 0.65* 0.45*   ------------------------------------------------------------------------------------------------------------------  RADIOLOGY:  No results found.   ASSESSMENT AND PLAN:   57 year old female with a history of diabetes who presented to the emergency room due to shortness of breath.  1.  Acute hypoxic respiratory failure due to submassive pulmonary emboli with associated right heart strain status post thrombectomy:   2.  Submassive pulmonary emboli status post thrombectomy: Continue anticoagulation, Consider changing to oral anticoagulation ECHOCardiogram shows normal ejection fraction with elevated PA pressures. Can consider changing to Eliquis.   3.  Cardiogenic shock due to problem #2:  SHe has been weaned off of dopamine Elevation in troponin is due to demand ischemia from pulmonary emboli. 4.  Diabetes: Continue Lantus with sliding scale and ADA diet  5.  Hypertension: Continue Benzapril  6.  Hyperlipidemia: Continue statin Management plans discussed with the patient and she is in agreement.  CODE STATUS: FULL  TOTAL TIME TAKING CARE OF THIS PATIENT: 24 minutes.   Pt eval    POSSIBLE D/C tomorrow DEPENDING ON CLINICAL CONDITION.   Anterrio Mccleery M.D on 03/21/2018  at 12:31 PM  Between 7am to 6pm - Pager - [phone] After 6pm go to [email or web address] - password [email or web address] [facility]  Office  [phone]  CC: Primary care physician; Elenore Paddy, FNP  Note: This dictation was prepared with Dragon dictation along with smaller phrase technology. Any transcriptional errors that result from this process are unintentional.

## 2018-03-21 NOTE — Progress Notes (Signed)
MD notified. RN request ACHS CBGs and something PO for pain as patient is complaining of Neck pain. Orders received. I will continue to assess.

## 2018-03-21 NOTE — Evaluation (Signed)
Physical Therapy Evaluation Patient Details Name: Maria [name] MRN: [id number] DOB: 06-16-1960 Today's Date: 03/21/2018   History of Present Illness  Pt is 57 year old morbidly obese woman, lifelong never smoker who presented with submassive PE and right heart strain with associated cardiogenic shock. POD #2 s/p pulmonary thrombectomy/catheter directed thrombolysis. PMH of depression, DM, bariatric surgery, GERD, HTN, bilateral knee replacement  Clinical Impression  Pt A&Ox4 at start of session, no complaints of pain or SOB at rest, on room air. Pt reported living in 1 story house with significant other and sister. Pt reported someone would be available 24/7 to provide assistance if needed. Prior to admission independent in ambulation, ADLs, IADLs.  Pt demonstrated bed mobility with mod I, able to sit EOB for several minutes without SOB, close supervision. Transferred with hand held assist/close supervision, steady, no LOB noted. First attempt of ambulation on room air ~55ft with hand held assist, desatted to mid 80s, pt mildly SOB. Second attempt with [place] at 2L, pt able to ambulate ~35ft with CGA and no AD. Slow, steady, wide base of support, decreased step length bilaterally, spO2 at 90% after mobility. Recovered quickly and stable on room air,  mild complaints of fatigue/SOB at end of session. Overall the patient demonstrated limitations (see "PT Problem  List") that impede pt's ability to perform functional activities. The patient would benefit from skilled PT to address these limitations to return pt to PLOF. Recommendation is HHPT with supervision/mobility for safety.     Follow Up Recommendations Home health PT;Supervision for mobility/OOB    Equipment Recommendations  Other (comment);None recommended by PT(Pt reported have SPC at home)    Recommendations for Other Services       Precautions / Restrictions Precautions Precautions: Fall Restrictions Weight Bearing Restrictions: No       Mobility  Bed Mobility Overal bed mobility: Modified Independent                Transfers Overall transfer level: Needs assistance Equipment used: None;1 person hand held assist Transfers: Sit to/from Stand Sit to Stand: Supervision;From elevated surface         General transfer comment: first attempt with hand held assist, second attempt with supervision. Wide BOS  Ambulation/Gait Ambulation/Gait assistance: Min guard Gait Distance (Feet): 80 Feet Assistive device: 1 person hand held assist;None       General Gait Details: wide BOS, significantly decreased gait velocity,. First attempt on room air with handheld assist, desatted to mid 35s, quick recovery with seated rest break. Second attempt able to increase distance on 2L oxygen, CGA. Pt fatigued after each trial, mild SOB noted. HR mid to low 120s during ambulation.   Stairs            Wheelchair Mobility    Modified Rankin (Stroke Patients Only)       Balance                                             Pertinent Vitals/Pain Pain Assessment: No/denies pain    Home Living Family/patient expects to be discharged to:: Private residence Living Arrangements: Spouse/significant other;Other relatives Available Help at Discharge: Family Type of Home: House Home Access: Stairs to enter Entrance Stairs-Rails: Can reach both;Left;Right Entrance Stairs-Number of Steps: 5 Home Layout: One level Home Equipment: Cane - single point;Bedside commode      Prior Function Level of Independence: Independent  Hand Dominance   Dominant Hand: Right    Extremity/Trunk Assessment   Upper Extremity Assessment Upper Extremity Assessment: Overall WFL for tasks assessed(grossly 4+/5)    Lower Extremity Assessment Lower Extremity Assessment: (bilateral hip flexion 4/5, all other MMT 4+/5)    Cervical / Trunk Assessment Cervical / Trunk Assessment: Normal  Communication    Communication: No difficulties  Cognition Arousal/Alertness: Awake/alert Behavior During Therapy: WFL for tasks assessed/performed Overall Cognitive Status: Within Functional Limits for tasks assessed                                        General Comments      Exercises     Assessment/Plan    PT Assessment Patient needs continued PT services  PT Problem List Decreased strength;Obesity;Decreased activity tolerance;Decreased balance;Cardiopulmonary status limiting activity;Decreased mobility       PT Treatment Interventions DME instruction;Gait training;Neuromuscular re-education;Stair training;Functional mobility training;Patient/family education;Therapeutic activities;Therapeutic exercise    PT Goals (Current goals can be found in the Care Plan section)  Acute Rehab PT Goals Patient Stated Goal: to get back to PLOF PT Goal Formulation: With patient Time For Goal Achievement: 04/04/18 Potential to Achieve Goals: Good    Frequency Min 2X/week   Barriers to discharge        Co-evaluation               AM-PAC PT "6 Clicks" Mobility  Outcome Measure Help needed turning from your back to your side while in a flat bed without using bedrails?: A Little Help needed moving from lying on your back to sitting on the side of a flat bed without using bedrails?: A Little Help needed moving to and from a bed to a chair (including a wheelchair)?: A Little Help needed standing up from a chair using your arms (e.g., wheelchair or bedside chair)?: None Help needed to walk in hospital room?: A Little Help needed climbing 3-5 steps with a railing? : A Little 6 Click Score: 19    End of Session Equipment Utilized During Treatment: Gait belt Activity Tolerance: Patient limited by fatigue Patient left: in chair;with call bell/phone within reach Nurse Communication: Mobility status PT Visit Diagnosis: Unsteadiness on feet (R26.81);Difficulty in walking, not  elsewhere classified (R26.2);Muscle weakness (generalized) (M62.81)    Time: 0251-0329 PT Time Calculation (min) (ACUTE ONLY): 38 min   Charges:   PT Evaluation $PT Eval Low Complexity: 1 Low PT Treatments $Therapeutic Activity: 23-37 mins        Lieutenant Diego PT, DPT 3:43 PM,03/21/18 [phone]

## 2018-03-22 ENCOUNTER — Ambulatory Visit: Payer: PPO | Admitting: Obstetrics & Gynecology

## 2018-03-22 DIAGNOSIS — Z09 Encounter for follow-up examination after completed treatment for conditions other than malignant neoplasm: Secondary | ICD-10-CM | POA: Diagnosis not present

## 2018-03-22 DIAGNOSIS — Z86711 Personal history of pulmonary embolism: Secondary | ICD-10-CM | POA: Diagnosis not present

## 2018-03-22 DIAGNOSIS — M542 Cervicalgia: Secondary | ICD-10-CM | POA: Diagnosis not present

## 2018-03-22 DIAGNOSIS — I517 Cardiomegaly: Secondary | ICD-10-CM | POA: Diagnosis not present

## 2018-03-22 LAB — GLUCOSE, CAPILLARY
Glucose-Capillary: 138 mg/dL — ABNORMAL HIGH (ref 70–99)
Glucose-Capillary: 146 mg/dL — ABNORMAL HIGH (ref 70–99)
Glucose-Capillary: 147 mg/dL — ABNORMAL HIGH (ref 70–99)
Glucose-Capillary: 161 mg/dL — ABNORMAL HIGH (ref 70–99)

## 2018-03-22 LAB — BASIC METABOLIC PANEL
Anion gap: 7 (ref 5–15)
BUN: 14 mg/dL (ref 6–20)
CO2: 25 mmol/L (ref 22–32)
Calcium: 8.6 mg/dL — ABNORMAL LOW (ref 8.9–10.3)
Chloride: 109 mmol/L (ref 98–111)
Creatinine, Ser: 1 mg/dL (ref 0.44–1.00)
GFR calc Af Amer: >60 mL/min (ref >60)
GFR calc non Af Amer: >60 mL/min (ref >60)
Glucose, Bld: 146 mg/dL — ABNORMAL HIGH (ref 70–99)
Potassium: 3.6 mmol/L (ref 3.5–5.1)
Sodium: 141 mmol/L (ref 135–145)

## 2018-03-22 MED ORDER — APIXABAN 5 MG PO TABS: 10.0000 mg | ORAL_TABLET | Freq: Two times a day (BID) | ORAL | 0 refills | Status: DC

## 2018-03-22 MED ORDER — APIXABAN 5 MG PO TABS: 5.0000 mg | ORAL_TABLET | Freq: Two times a day (BID) | ORAL | 0 refills | Status: DC

## 2018-03-22 MED ORDER — OXYCODONE HCL 15 MG PO TABS: 15.0000 mg | ORAL_TABLET | Freq: Four times a day (QID) | ORAL | 0 refills | Status: DC | PRN

## 2018-03-22 NOTE — Discharge Summary (Signed)
[facility] [place] NAME: Maria [name]    MR#:  [id number]  DATE OF BIRTH:  [birth year]  DATE OF ADMISSION:  03/19/2018 ADMITTING PHYSICIAN: Epifanio Lesches, MD  DATE OF DISCHARGE: 03/22/2018  PRIMARY CARE PHYSICIAN: Elenore Paddy, FNP    ADMISSION DIAGNOSIS:  Acute respiratory failure with hypoxia ([place]) [J96.01] Other acute pulmonary embolism with acute cor pulmonale ([facility]) [I26.09]  DISCHARGE DIAGNOSIS:  Active Problems:   Acute respiratory failure with hypoxemia ([place])   SECONDARY DIAGNOSIS:   Past Medical History:  Diagnosis Date  . Allergic rhinitis   . Anemia    1980's  . Anxiety   . Carpal tunnel syndrome, left    pt denies  . Depression   . Diabetes mellitus    has not taken medications since 07/2016 after bariatric surgery  . DUB (dysfunctional uterine bleeding)   . Dysrhythmia    tachycardic  . External hemorrhoid   . GERD (gastroesophageal reflux disease)   . Gout   . History of hiatal hernia   . History of kidney stones   . Hypertension   . Morbid obesity ([place])   . Obesity   . Osteoarthritis, knee   . Pneumonia   . Recurrent UTI   . Rotator cuff syndrome of left shoulder     HOSPITAL COURSE:   57 year old female with a history of diabetes who presented to the emergency room due to shortness of breath.  1.  Acute hypoxic respiratory failure due to submassive pulmonary emboli without associated right heart strain status post thrombectomy: Patient has been weaned off of oxygen.  2.  Submassive pulmonary emboli status post thrombectomy:  Patient underwent emergent thrombectomy on the day of admission.  She was placed on heparin drip and transition to oral Eliquis.  Side effects, alternatives, risks and benefits were discussed with patient who agrees these risks not only limited to bleeding.  She will follow-up with oncology as an outpatient.  She will follow-up with her PCP as well.   ECHOCardiogram  shows normal ejection fraction with elevated PA pressures.    3.  Cardiogenic shock due to problem #2: Has resolved. SHe has been weaned off of dopamine Elevation in troponin is due to demand ischemia from pulmonary emboli. 4.  Diabetes: She is uncontrolled diabetes with A1c of 10.5 she needs close follow-up with her PCP.  She will be discharged on her outpatient regimen and ADA diet.  5.  Hypertension: Continue Benzapril and metoprolol  6.  Hyperlipidemia: Continue statin   DISCHARGE CONDITIONS AND DIET:   Stable for discharge on heart healthy diabetic diet  CONSULTS OBTAINED:  Treatment Team:  [facility], [facility], MD  DRUG ALLERGIES:   Allergies  Allergen Reactions  . Propoxyphene N-Acetaminophen Palpitations    DISCHARGE MEDICATIONS:   Allergies as of 03/22/2018      Reactions   Propoxyphene N-acetaminophen Palpitations      Medication List    STOP taking these medications   fluconazole 150 MG tablet Commonly known as:  DIFLUCAN   metroNIDAZOLE 500 MG tablet Commonly known as:  FLAGYL     TAKE these medications   apixaban 5 MG Tabs tablet Commonly known as:  ELIQUIS Take 2 tablets (10 mg total) by mouth 2 (two) times daily for 5 days. Then start 5 mg Eliquis on 12/18   apixaban 5 MG Tabs tablet Commonly known as:  ELIQUIS Take 1 tablet (5 mg total) by mouth 2 (two) times daily. Start taking on:  March 28, 2018   atorvastatin 20 MG tablet Commonly known as:  LIPITOR Take 20 mg by mouth at bedtime.   benazepril 40 MG tablet Commonly known as:  LOTENSIN Take 40 mg by mouth daily.   CALCIUM + D [id number] MG-UNT-MCG Chew Generic drug:  Calcium-Vitamin D-Vitamin K Chew 2 each by mouth daily.   Colchicine 0.6 MG Caps Take 0.6 mg by mouth daily.   furosemide 20 MG tablet Commonly known as:  LASIX Take 20 mg by mouth daily.   glipiZIDE 5 MG tablet Commonly known as:  GLUCOTROL Take 5 mg by mouth daily.   LANTUS SOLOSTAR 100 UNIT/ML  Solostar Pen Generic drug:  Insulin Glargine Inject 10 Units into the skin at bedtime.   metFORMIN 850 MG tablet Commonly known as:  GLUCOPHAGE Take 850 mg by mouth 2 (two) times daily.   metoprolol 200 MG 24 hr tablet Commonly known as:  TOPROL-XL Take 200 mg by mouth daily.   multivitamin with minerals tablet Take 1 tablet by mouth daily.   nitroGLYCERIN 0.2 mg/hr patch Commonly known as:  NITRODUR - Dosed in mg/24 hr Use 1/4 patch daily to the affected area.   oxyCODONE 15 MG immediate release tablet Commonly known as:  ROXICODONE Take 1 tablet (15 mg total) by mouth every 6 (six) hours as needed for pain.   pantoprazole 40 MG tablet Commonly known as:  PROTONIX Take 40 mg by mouth daily.         Today   CHIEF COMPLAINT:  She is doing well and ready to be discharged home.   VITAL SIGNS:  Blood pressure 128/70, pulse 91, temperature 98.4 F (36.9 C), temperature source Oral, resp. rate 20, height 5\' 5"  (1.651 m), weight (!) 160.3 kg, last menstrual period 04/11/2009, SpO2 95 %.   REVIEW OF SYSTEMS:  Review of Systems  Constitutional: Negative.  Negative for chills, fever and malaise/fatigue.  HENT: Negative.  Negative for ear discharge, ear pain, hearing loss, nosebleeds and sore throat.   Eyes: Negative.  Negative for blurred vision and pain.  Respiratory: Negative.  Negative for cough, hemoptysis, shortness of breath and wheezing.   Cardiovascular: Negative.  Negative for chest pain, palpitations and leg swelling.  Gastrointestinal: Negative.  Negative for abdominal pain, blood in stool, diarrhea, nausea and vomiting.  Genitourinary: Negative.  Negative for dysuria.  Musculoskeletal: Negative.  Negative for back pain.  Skin: Negative.   Neurological: Negative for dizziness, tremors, speech change, focal weakness, seizures and headaches.  Endo/Heme/Allergies: Negative.  Does not bruise/bleed easily.  Psychiatric/Behavioral: Negative.  Negative for  depression, hallucinations and suicidal ideas.     PHYSICAL EXAMINATION:  GENERAL:  57 y.o.-year-old patient lying in the bed with no acute distress.  NECK:  Supple, no jugular venous distention. No thyroid enlargement, no tenderness.  LUNGS: Normal breath sounds bilaterally, no wheezing, rales,rhonchi  No use of accessory muscles of respiration.  CARDIOVASCULAR: S1, S2 normal. No murmurs, rubs, or gallops.  ABDOMEN: Soft, non-tender, non-distended. Bowel sounds present. No organomegaly or mass.  EXTREMITIES: No pedal edema, cyanosis, or clubbing.  PSYCHIATRIC: The patient is alert and oriented x 3.  SKIN: No obvious rash, lesion, or ulcer.   DATA REVIEW:   CBC Recent Labs  Lab 03/21/18 0514  WBC 7.6  HGB 11.3*  HCT 36.5  PLT 201    Chemistries  Recent Labs  Lab 03/22/18 0314  NA 141  K 3.6  CL 109  CO2 25  GLUCOSE 146*  BUN 14  CREATININE 1.00  CALCIUM 8.6*    Cardiac Enzymes Recent Labs  Lab 03/19/18 1903 03/20/18 0147 03/20/18 0655  TROPONINI 0.76* 0.65* 0.45*    Microbiology Results  @MICRORSLT48 @  RADIOLOGY:  No results found.    Allergies as of 03/22/2018      Reactions   Propoxyphene N-acetaminophen Palpitations      Medication List    STOP taking these medications   fluconazole 150 MG tablet Commonly known as:  DIFLUCAN   metroNIDAZOLE 500 MG tablet Commonly known as:  FLAGYL     TAKE these medications   apixaban 5 MG Tabs tablet Commonly known as:  ELIQUIS Take 2 tablets (10 mg total) by mouth 2 (two) times daily for 5 days. Then start 5 mg Eliquis on 12/18   apixaban 5 MG Tabs tablet Commonly known as:  ELIQUIS Take 1 tablet (5 mg total) by mouth 2 (two) times daily. Start taking on:  March 28, 2018   atorvastatin 20 MG tablet Commonly known as:  LIPITOR Take 20 mg by mouth at bedtime.   benazepril 40 MG tablet Commonly known as:  LOTENSIN Take 40 mg by mouth daily.   CALCIUM + D [id number] MG-UNT-MCG  Chew Generic drug:  Calcium-Vitamin D-Vitamin K Chew 2 each by mouth daily.   Colchicine 0.6 MG Caps Take 0.6 mg by mouth daily.   furosemide 20 MG tablet Commonly known as:  LASIX Take 20 mg by mouth daily.   glipiZIDE 5 MG tablet Commonly known as:  GLUCOTROL Take 5 mg by mouth daily.   LANTUS SOLOSTAR 100 UNIT/ML Solostar Pen Generic drug:  Insulin Glargine Inject 10 Units into the skin at bedtime.   metFORMIN 850 MG tablet Commonly known as:  GLUCOPHAGE Take 850 mg by mouth 2 (two) times daily.   metoprolol 200 MG 24 hr tablet Commonly known as:  TOPROL-XL Take 200 mg by mouth daily.   multivitamin with minerals tablet Take 1 tablet by mouth daily.   nitroGLYCERIN 0.2 mg/hr patch Commonly known as:  NITRODUR - Dosed in mg/24 hr Use 1/4 patch daily to the affected area.   oxyCODONE 15 MG immediate release tablet Commonly known as:  ROXICODONE Take 1 tablet (15 mg total) by mouth every 6 (six) hours as needed for pain.   pantoprazole 40 MG tablet Commonly known as:  PROTONIX Take 40 mg by mouth daily.          Management plans discussed with the patient and she is in agreement. Stable for discharge home  Patient should follow up with pcp  CODE STATUS:     Code Status Orders  (From admission, onward)         Start     Ordered   03/19/18 1333  Full code  Continuous     03/19/18 1334        Code Status History    Date Active Date Inactive Code Status Order ID Comments User Context   07/26/2016 1508 07/27/2016 2034 Full Code [id number]  Johnathan Hausen, MD Inpatient   05/31/2014 1847 06/02/2014 1715 Full Code [id number]  Sid Falcon, MD Inpatient      TOTAL TIME TAKING CARE OF THIS PATIENT: 38 minutes.    Note: This dictation was prepared with Dragon dictation along with smaller phrase technology. Any transcriptional errors that result from this process are unintentional.  Johntay Doolen M.D on 03/22/2018 at 8:52 AM  Between 7am to 6pm - Pager  - [phone] After 6pm go to [email or web address] -  password [email or web address] [facility]  Office  [phone]  CC: Primary care physician; Elenore Paddy, [place]

## 2018-03-22 NOTE — Care Management Note (Signed)
Case Management Note  Patient Details  Name: Maria [name] MRN: [id number] Date of Birth: Aug 18, 1960  Subjective/Objective:     Patient is from home with sister.  Admitted with chest pain and sob.  Hx of artrial fibrillation.  Started on Eliquis; 30 day free coupon given to patient.  She is current with her PCP.  Obtains prescriptions without difficulty; has part D coverage.  She is independent at home in all adl's.  No equipment in the home.  She is discharging today with daughter.  No further needs identified by this RNCM.            Action/Plan:   Expected Discharge Date:  03/22/18               Expected Discharge Plan:  Home/Self Care  In-House Referral:     Discharge planning Services  CM Consult  Post Acute Care Choice:    Choice offered to:     DME Arranged:    DME Agency:     HH Arranged:    HH Agency:     Status of Service:  Completed, signed off  If discussed at [employer] of Stay Meetings, dates discussed:    Additional Comments:  Elza Rafter, RN 03/22/2018, 9:36 AM

## 2018-03-22 NOTE — Progress Notes (Signed)
Iv and tele removed from patient. Discharge instructions given to patient along with hard copy prescriptions. No acute distress at this time. Patient to call for a ride home.

## 2018-03-26 DIAGNOSIS — Z86711 Personal history of pulmonary embolism: Secondary | ICD-10-CM | POA: Diagnosis not present

## 2018-03-26 DIAGNOSIS — M542 Cervicalgia: Secondary | ICD-10-CM | POA: Diagnosis not present

## 2018-03-26 DIAGNOSIS — E559 Vitamin D deficiency, unspecified: Secondary | ICD-10-CM | POA: Diagnosis not present

## 2018-03-26 DIAGNOSIS — E119 Type 2 diabetes mellitus without complications: Secondary | ICD-10-CM | POA: Diagnosis not present

## 2018-03-29 ENCOUNTER — Encounter: Payer: PPO | Admitting: Oncology

## 2018-03-30 DIAGNOSIS — Z79899 Other long term (current) drug therapy: Secondary | ICD-10-CM | POA: Diagnosis not present

## 2018-03-30 DIAGNOSIS — G8929 Other chronic pain: Secondary | ICD-10-CM | POA: Diagnosis not present

## 2018-03-30 DIAGNOSIS — M546 Pain in thoracic spine: Secondary | ICD-10-CM | POA: Diagnosis not present

## 2018-03-30 DIAGNOSIS — M542 Cervicalgia: Secondary | ICD-10-CM | POA: Diagnosis not present

## 2018-03-30 DIAGNOSIS — M545 Low back pain: Secondary | ICD-10-CM | POA: Diagnosis not present

## 2018-04-02 ENCOUNTER — Telehealth: Payer: Self-pay | Admitting: *Deleted

## 2018-04-02 ENCOUNTER — Other Ambulatory Visit: Payer: Self-pay | Admitting: *Deleted

## 2018-04-02 NOTE — Telephone Encounter (Signed)
Informed patient that the provider in the office today did not recommend calling in diflucan for her yeast infection due to the contraindication alert with her colchicine medication. Pt explained that we have given it to her in the past and has had not problems and that the pharmacy has also warned her about the contraindications. Explained that my provider recommend Monistat OTC.

## 2018-04-02 NOTE — Telephone Encounter (Signed)
-----   Message from Allena Earing, NT sent at 04/02/2018  8:24 AM EST ----- Regarding: rx for yeast infection Patient has been in the hospital in ICU and wants to know if she can get an rx for yeast infection without coming in, if so please send to her pharmacy on file. Also, let pt know that med was sent

## 2018-04-05 ENCOUNTER — Other Ambulatory Visit: Payer: Self-pay | Admitting: *Deleted

## 2018-04-05 MED ORDER — FLUCONAZOLE 150 MG PO TABS: 150.0000 mg | ORAL_TABLET | Freq: Once | ORAL | 1 refills | Status: AC

## 2018-04-09 ENCOUNTER — Other Ambulatory Visit: Payer: Self-pay | Admitting: Obstetrics & Gynecology

## 2018-04-10 ENCOUNTER — Telehealth: Payer: Self-pay | Admitting: *Deleted

## 2018-04-10 MED ORDER — FLUCONAZOLE 150 MG PO TABS: 150.0000 mg | ORAL_TABLET | ORAL | 1 refills | Status: DC

## 2018-04-10 NOTE — Telephone Encounter (Signed)
Pt called stating she thinks she has BV due to itching in her vagina, denies any foul order or change in discharge. Stated the Diflucan we sent in has not helped her. Will talk with Provider and call patient back.

## 2018-04-10 NOTE — Telephone Encounter (Signed)
Called patient after speaking with Dr Kennon Rounds, called in another dose of diflucan to take for 3 days and then to repeat in one week if not better. Informed patient that if this doesn't help she will need to be seen for further evaluation.

## 2018-04-26 DIAGNOSIS — M545 Low back pain: Secondary | ICD-10-CM | POA: Diagnosis not present

## 2018-04-26 DIAGNOSIS — Z79899 Other long term (current) drug therapy: Secondary | ICD-10-CM | POA: Diagnosis not present

## 2018-04-26 DIAGNOSIS — M79671 Pain in right foot: Secondary | ICD-10-CM | POA: Diagnosis not present

## 2018-04-26 DIAGNOSIS — J018 Other acute sinusitis: Secondary | ICD-10-CM | POA: Diagnosis not present

## 2018-04-26 DIAGNOSIS — M546 Pain in thoracic spine: Secondary | ICD-10-CM | POA: Diagnosis not present

## 2018-04-26 DIAGNOSIS — E119 Type 2 diabetes mellitus without complications: Secondary | ICD-10-CM | POA: Diagnosis not present

## 2018-04-26 DIAGNOSIS — M542 Cervicalgia: Secondary | ICD-10-CM | POA: Diagnosis not present

## 2018-04-26 DIAGNOSIS — Z7901 Long term (current) use of anticoagulants: Secondary | ICD-10-CM | POA: Diagnosis not present

## 2018-05-08 DIAGNOSIS — I2699 Other pulmonary embolism without acute cor pulmonale: Secondary | ICD-10-CM | POA: Diagnosis not present

## 2018-05-08 DIAGNOSIS — R0602 Shortness of breath: Secondary | ICD-10-CM | POA: Diagnosis not present

## 2018-05-08 DIAGNOSIS — R942 Abnormal results of pulmonary function studies: Secondary | ICD-10-CM | POA: Diagnosis not present

## 2018-05-08 DIAGNOSIS — D699 Hemorrhagic condition, unspecified: Secondary | ICD-10-CM | POA: Diagnosis not present

## 2018-05-11 ENCOUNTER — Encounter: Payer: Self-pay | Admitting: Family Medicine

## 2018-05-11 ENCOUNTER — Ambulatory Visit (INDEPENDENT_AMBULATORY_CARE_PROVIDER_SITE_OTHER): Payer: PPO | Admitting: Family Medicine

## 2018-05-11 DIAGNOSIS — M109 Gout, unspecified: Secondary | ICD-10-CM | POA: Diagnosis not present

## 2018-05-11 MED ORDER — METHYLPREDNISOLONE ACETATE 80 MG/ML IJ SUSP
80.0000 mg | Freq: Once | INTRAMUSCULAR | Status: AC
  Administered 2018-05-11: 80 mg via INTRAMUSCULAR

## 2018-05-11 MED ORDER — COLCHICINE 0.6 MG PO CAPS: ORAL_CAPSULE | ORAL | 5 refills | Status: DC

## 2018-05-11 NOTE — Progress Notes (Signed)
  Maria [name] - 58 y.o. female MRN [id number]  Date of birth: 15-Feb-1961    SUBJECTIVE:      Chief Complaint:/ HPI:  Pain in first and second toes on the right foot.  Has been going on for for 5 days.  She had been on daily colchicine but her PCP told her to stop that and place her on allopurinol.  She did not think the allopurinol was do anything so she has not been on that for a while either.  Pain in her toes is typical of her gout, aching, tender to touch, extremely painful.   ROS:     No fever.  No other unusual joint arthralgias.  PERTINENT  PMH / PSH FH / / SH:  Past Medical, Surgical, Social, and Family History Reviewed & Updated in the EMR.  Pertinent findings include:  Recently in the hospital for pulmonary embolus, now on chronic anticoagulation with apixaban  OBJECTIVE: BP 135/69   Ht 5' 8.5" (1.74 m)   Wt (!) 323 lb (146.5 kg)   LMP 04/11/2009   BMI 48.40 kg/m   Physical Exam:  Vital signs are reviewed. GENERAL: Well-developed overweight female no acute distress TOES: All toes are without deformity.  Tender to palpation over the first and second toe on the right foot.  There is no unusual erythema or warmth noted here but they are exquisitely tender.  ASSESSMENT & PLAN:  See problem based charting & AVS for pt instructions.

## 2018-05-11 NOTE — Assessment & Plan Note (Signed)
Looking back several years ago she had elevated uric acid so she probably would benefit from allopurinol.  It would not be the time to start that since she is in the middle of a gout flare.  Long discussion with her spending greater than 50% of our 25-minute office visit in counseling and education regarding gout and gout treatment.  I will start her back on colchicine twice a day for 3 days and then once a day until her gout flare has stopped.  She can then discontinue colchicine and start back on allopurinol. Since she was having so much pain today, we also gave her an injection intramuscularly of Depo-Medrol.

## 2018-05-11 NOTE — Patient Instructions (Signed)
I agree that you are probably having a gout flare.  I would start back on the colchicine taking 1 twice a day for the next 2 or 3 days.  When the pain eases off, go back to taking once a day and take that for the next 3 weeks after the pain has stopped.  On the fourth week, start taking allopurinol that your primary care doctor gave you.  You can discontinue the colchicine at that time.  Stay on the allopurinol because that keeps the frequency of gout attacks lower.  At the very first sign of a gout flare, start colchicine once a day but continue on the allopurinol.  It was great seeing you!

## 2018-05-25 DIAGNOSIS — M79671 Pain in right foot: Secondary | ICD-10-CM | POA: Diagnosis not present

## 2018-05-25 DIAGNOSIS — Z79899 Other long term (current) drug therapy: Secondary | ICD-10-CM | POA: Diagnosis not present

## 2018-05-25 DIAGNOSIS — G8929 Other chronic pain: Secondary | ICD-10-CM | POA: Diagnosis not present

## 2018-05-25 DIAGNOSIS — M546 Pain in thoracic spine: Secondary | ICD-10-CM | POA: Diagnosis not present

## 2018-05-25 DIAGNOSIS — E119 Type 2 diabetes mellitus without complications: Secondary | ICD-10-CM | POA: Diagnosis not present

## 2018-05-25 DIAGNOSIS — M545 Low back pain: Secondary | ICD-10-CM | POA: Diagnosis not present

## 2018-05-27 DIAGNOSIS — I10 Essential (primary) hypertension: Secondary | ICD-10-CM | POA: Diagnosis not present

## 2018-05-27 DIAGNOSIS — Z9049 Acquired absence of other specified parts of digestive tract: Secondary | ICD-10-CM | POA: Diagnosis not present

## 2018-05-27 DIAGNOSIS — Z86711 Personal history of pulmonary embolism: Secondary | ICD-10-CM | POA: Diagnosis not present

## 2018-05-27 DIAGNOSIS — R1032 Left lower quadrant pain: Secondary | ICD-10-CM | POA: Diagnosis not present

## 2018-05-27 DIAGNOSIS — Z79899 Other long term (current) drug therapy: Secondary | ICD-10-CM | POA: Diagnosis not present

## 2018-05-27 DIAGNOSIS — R110 Nausea: Secondary | ICD-10-CM | POA: Diagnosis not present

## 2018-05-27 DIAGNOSIS — E119 Type 2 diabetes mellitus without complications: Secondary | ICD-10-CM | POA: Diagnosis not present

## 2018-05-27 DIAGNOSIS — N201 Calculus of ureter: Secondary | ICD-10-CM | POA: Diagnosis not present

## 2018-05-27 DIAGNOSIS — Z7901 Long term (current) use of anticoagulants: Secondary | ICD-10-CM | POA: Diagnosis not present

## 2018-05-27 DIAGNOSIS — Z794 Long term (current) use of insulin: Secondary | ICD-10-CM | POA: Diagnosis not present

## 2018-05-27 DIAGNOSIS — N132 Hydronephrosis with renal and ureteral calculous obstruction: Secondary | ICD-10-CM | POA: Diagnosis not present

## 2018-05-28 DIAGNOSIS — N200 Calculus of kidney: Secondary | ICD-10-CM | POA: Diagnosis not present

## 2018-05-28 DIAGNOSIS — R110 Nausea: Secondary | ICD-10-CM | POA: Diagnosis not present

## 2018-05-28 DIAGNOSIS — R3129 Other microscopic hematuria: Secondary | ICD-10-CM | POA: Diagnosis not present

## 2018-06-06 DIAGNOSIS — I2699 Other pulmonary embolism without acute cor pulmonale: Secondary | ICD-10-CM | POA: Diagnosis not present

## 2018-06-06 DIAGNOSIS — R942 Abnormal results of pulmonary function studies: Secondary | ICD-10-CM | POA: Diagnosis not present

## 2018-06-08 DIAGNOSIS — E103392 Type 1 diabetes mellitus with moderate nonproliferative diabetic retinopathy without macular edema, left eye: Secondary | ICD-10-CM | POA: Diagnosis not present

## 2018-06-08 DIAGNOSIS — H52203 Unspecified astigmatism, bilateral: Secondary | ICD-10-CM | POA: Diagnosis not present

## 2018-06-15 ENCOUNTER — Other Ambulatory Visit: Payer: Self-pay | Admitting: Obstetrics & Gynecology

## 2018-06-18 ENCOUNTER — Other Ambulatory Visit (HOSPITAL_COMMUNITY): Payer: Self-pay | Admitting: Family Medicine

## 2018-06-18 ENCOUNTER — Other Ambulatory Visit: Payer: Self-pay | Admitting: Family Medicine

## 2018-06-18 ENCOUNTER — Other Ambulatory Visit: Payer: Self-pay | Admitting: Pulmonary Disease

## 2018-06-18 DIAGNOSIS — E1165 Type 2 diabetes mellitus with hyperglycemia: Secondary | ICD-10-CM | POA: Diagnosis not present

## 2018-06-18 DIAGNOSIS — I2699 Other pulmonary embolism without acute cor pulmonale: Secondary | ICD-10-CM

## 2018-06-18 DIAGNOSIS — E559 Vitamin D deficiency, unspecified: Secondary | ICD-10-CM | POA: Diagnosis not present

## 2018-06-18 DIAGNOSIS — N6002 Solitary cyst of left breast: Secondary | ICD-10-CM | POA: Diagnosis not present

## 2018-06-25 ENCOUNTER — Ambulatory Visit (HOSPITAL_COMMUNITY)
Admission: RE | Admit: 2018-06-25 | Discharge: 2018-06-25 | Disposition: A | Discharge status: Home / Self Care | Payer: PPO | Source: Ambulatory Visit | Attending: Family Medicine | Admitting: Family Medicine

## 2018-06-25 ENCOUNTER — Encounter (HOSPITAL_COMMUNITY): Payer: Self-pay

## 2018-06-25 ENCOUNTER — Other Ambulatory Visit: Payer: Self-pay

## 2018-06-25 DIAGNOSIS — M545 Low back pain: Secondary | ICD-10-CM | POA: Diagnosis not present

## 2018-06-25 DIAGNOSIS — G8929 Other chronic pain: Secondary | ICD-10-CM | POA: Diagnosis not present

## 2018-06-25 DIAGNOSIS — Z79899 Other long term (current) drug therapy: Secondary | ICD-10-CM | POA: Diagnosis not present

## 2018-06-25 DIAGNOSIS — I2699 Other pulmonary embolism without acute cor pulmonale: Secondary | ICD-10-CM

## 2018-06-25 DIAGNOSIS — E1165 Type 2 diabetes mellitus with hyperglycemia: Secondary | ICD-10-CM | POA: Diagnosis not present

## 2018-06-26 ENCOUNTER — Ambulatory Visit (HOSPITAL_COMMUNITY): Payer: PPO | Attending: Pulmonary Disease

## 2018-06-26 ENCOUNTER — Other Ambulatory Visit: Payer: Self-pay | Admitting: Pulmonary Disease

## 2018-06-26 DIAGNOSIS — I2699 Other pulmonary embolism without acute cor pulmonale: Secondary | ICD-10-CM

## 2018-06-27 ENCOUNTER — Other Ambulatory Visit: Payer: Self-pay

## 2018-06-27 ENCOUNTER — Ambulatory Visit (HOSPITAL_COMMUNITY)
Admission: RE | Admit: 2018-06-27 | Discharge: 2018-06-27 | Disposition: A | Discharge status: Home / Self Care | Payer: PPO | Source: Ambulatory Visit | Attending: Pulmonary Disease | Admitting: Pulmonary Disease

## 2018-06-27 DIAGNOSIS — I2699 Other pulmonary embolism without acute cor pulmonale: Secondary | ICD-10-CM

## 2018-06-27 DIAGNOSIS — Z09 Encounter for follow-up examination after completed treatment for conditions other than malignant neoplasm: Secondary | ICD-10-CM | POA: Diagnosis not present

## 2018-06-27 MED ORDER — SODIUM CHLORIDE (PF) 0.9 % IJ SOLN
INTRAMUSCULAR | Status: AC
  Filled 2018-06-27: qty 50

## 2018-06-27 MED ORDER — IOHEXOL 350 MG/ML SOLN
80.0000 mL | Freq: Once | INTRAVENOUS | Status: AC | PRN
  Administered 2018-06-27: 80 mL via INTRAVENOUS

## 2018-07-17 ENCOUNTER — Ambulatory Visit: Payer: PPO | Admitting: Family Medicine

## 2018-07-17 ENCOUNTER — Other Ambulatory Visit: Payer: Self-pay | Admitting: Family Medicine

## 2018-07-17 DIAGNOSIS — M109 Gout, unspecified: Secondary | ICD-10-CM

## 2018-07-19 DIAGNOSIS — K5730 Diverticulosis of large intestine without perforation or abscess without bleeding: Secondary | ICD-10-CM | POA: Diagnosis not present

## 2018-07-19 DIAGNOSIS — E1165 Type 2 diabetes mellitus with hyperglycemia: Secondary | ICD-10-CM | POA: Diagnosis not present

## 2018-07-19 DIAGNOSIS — R1032 Left lower quadrant pain: Secondary | ICD-10-CM | POA: Diagnosis not present

## 2018-07-19 DIAGNOSIS — R0602 Shortness of breath: Secondary | ICD-10-CM | POA: Diagnosis not present

## 2018-07-19 DIAGNOSIS — M47816 Spondylosis without myelopathy or radiculopathy, lumbar region: Secondary | ICD-10-CM | POA: Diagnosis not present

## 2018-07-19 DIAGNOSIS — Z7901 Long term (current) use of anticoagulants: Secondary | ICD-10-CM | POA: Diagnosis not present

## 2018-07-19 DIAGNOSIS — M0609 Rheumatoid arthritis without rheumatoid factor, multiple sites: Secondary | ICD-10-CM | POA: Diagnosis not present

## 2018-07-19 DIAGNOSIS — Z87442 Personal history of urinary calculi: Secondary | ICD-10-CM | POA: Diagnosis not present

## 2018-07-19 DIAGNOSIS — Z79899 Other long term (current) drug therapy: Secondary | ICD-10-CM | POA: Diagnosis not present

## 2018-07-19 DIAGNOSIS — K640 First degree hemorrhoids: Secondary | ICD-10-CM | POA: Diagnosis not present

## 2018-07-19 DIAGNOSIS — D123 Benign neoplasm of transverse colon: Secondary | ICD-10-CM | POA: Diagnosis not present

## 2018-07-19 DIAGNOSIS — M5416 Radiculopathy, lumbar region: Secondary | ICD-10-CM | POA: Diagnosis not present

## 2018-07-19 DIAGNOSIS — R110 Nausea: Secondary | ICD-10-CM | POA: Diagnosis not present

## 2018-07-19 DIAGNOSIS — M545 Low back pain: Secondary | ICD-10-CM | POA: Diagnosis not present

## 2018-07-19 DIAGNOSIS — K635 Polyp of colon: Secondary | ICD-10-CM | POA: Diagnosis not present

## 2018-07-19 DIAGNOSIS — M869 Osteomyelitis, unspecified: Secondary | ICD-10-CM | POA: Diagnosis not present

## 2018-07-19 DIAGNOSIS — E11621 Type 2 diabetes mellitus with foot ulcer: Secondary | ICD-10-CM | POA: Diagnosis not present

## 2018-07-19 DIAGNOSIS — I739 Peripheral vascular disease, unspecified: Secondary | ICD-10-CM | POA: Diagnosis not present

## 2018-07-19 DIAGNOSIS — M109 Gout, unspecified: Secondary | ICD-10-CM | POA: Diagnosis not present

## 2018-07-19 DIAGNOSIS — Z9889 Other specified postprocedural states: Secondary | ICD-10-CM | POA: Diagnosis not present

## 2018-07-19 DIAGNOSIS — N200 Calculus of kidney: Secondary | ICD-10-CM | POA: Diagnosis not present

## 2018-07-19 DIAGNOSIS — Z86010 Personal history of colonic polyps: Secondary | ICD-10-CM | POA: Diagnosis not present

## 2018-07-19 DIAGNOSIS — K5900 Constipation, unspecified: Secondary | ICD-10-CM | POA: Diagnosis not present

## 2018-08-07 ENCOUNTER — Other Ambulatory Visit: Payer: Self-pay | Admitting: *Deleted

## 2018-08-07 ENCOUNTER — Telehealth: Payer: Self-pay | Admitting: *Deleted

## 2018-08-07 MED ORDER — METRONIDAZOLE 500 MG PO TABS: 500.0000 mg | ORAL_TABLET | Freq: Two times a day (BID) | ORAL | 0 refills | Status: DC

## 2018-08-07 NOTE — Telephone Encounter (Signed)
Pt notified of refill being sent in to her pharmacy

## 2018-08-07 NOTE — Telephone Encounter (Signed)
-----   Message from Allena Earing, NT sent at 08/07/2018  2:56 PM EDT ----- Regarding: requesting rx Patient called requesting flagyl again, she had a rx in march, then we sent her med for BV, now requesting flagyl again due to still have discharge. Will you just send rx again or need to call the patient?

## 2018-08-15 DIAGNOSIS — E119 Type 2 diabetes mellitus without complications: Secondary | ICD-10-CM | POA: Diagnosis not present

## 2018-08-15 DIAGNOSIS — G8929 Other chronic pain: Secondary | ICD-10-CM | POA: Diagnosis not present

## 2018-08-15 DIAGNOSIS — M545 Low back pain: Secondary | ICD-10-CM | POA: Diagnosis not present

## 2018-08-15 DIAGNOSIS — Z79899 Other long term (current) drug therapy: Secondary | ICD-10-CM | POA: Diagnosis not present

## 2018-08-22 ENCOUNTER — Other Ambulatory Visit: Payer: Self-pay | Admitting: Nurse Practitioner

## 2018-08-22 DIAGNOSIS — N6452 Nipple discharge: Secondary | ICD-10-CM

## 2018-08-22 DIAGNOSIS — R928 Other abnormal and inconclusive findings on diagnostic imaging of breast: Secondary | ICD-10-CM

## 2018-09-07 DIAGNOSIS — M545 Low back pain: Secondary | ICD-10-CM | POA: Diagnosis not present

## 2018-09-10 DIAGNOSIS — Z9189 Other specified personal risk factors, not elsewhere classified: Secondary | ICD-10-CM | POA: Diagnosis not present

## 2018-09-10 DIAGNOSIS — Z712 Person consulting for explanation of examination or test findings: Secondary | ICD-10-CM | POA: Diagnosis not present

## 2018-09-10 DIAGNOSIS — K521 Toxic gastroenteritis and colitis: Secondary | ICD-10-CM | POA: Diagnosis not present

## 2018-09-10 DIAGNOSIS — R110 Nausea: Secondary | ICD-10-CM | POA: Diagnosis not present

## 2018-09-12 ENCOUNTER — Other Ambulatory Visit: Payer: PPO

## 2018-09-12 ENCOUNTER — Other Ambulatory Visit: Payer: Self-pay | Admitting: Family Medicine

## 2018-09-12 DIAGNOSIS — M545 Low back pain: Secondary | ICD-10-CM | POA: Diagnosis not present

## 2018-09-12 DIAGNOSIS — M109 Gout, unspecified: Secondary | ICD-10-CM

## 2018-09-12 DIAGNOSIS — E119 Type 2 diabetes mellitus without complications: Secondary | ICD-10-CM | POA: Diagnosis not present

## 2018-09-12 DIAGNOSIS — G8929 Other chronic pain: Secondary | ICD-10-CM | POA: Diagnosis not present

## 2018-09-12 DIAGNOSIS — Z79899 Other long term (current) drug therapy: Secondary | ICD-10-CM | POA: Diagnosis not present

## 2018-09-12 DIAGNOSIS — Z1159 Encounter for screening for other viral diseases: Secondary | ICD-10-CM | POA: Diagnosis not present

## 2018-09-18 ENCOUNTER — Other Ambulatory Visit: Payer: PPO

## 2018-09-27 ENCOUNTER — Other Ambulatory Visit: Payer: PPO

## 2018-10-05 ENCOUNTER — Other Ambulatory Visit: Payer: PPO

## 2018-10-11 DIAGNOSIS — M545 Low back pain: Secondary | ICD-10-CM | POA: Diagnosis not present

## 2018-10-11 DIAGNOSIS — E119 Type 2 diabetes mellitus without complications: Secondary | ICD-10-CM | POA: Diagnosis not present

## 2018-10-11 DIAGNOSIS — M5030 Other cervical disc degeneration, unspecified cervical region: Secondary | ICD-10-CM | POA: Diagnosis not present

## 2018-10-11 DIAGNOSIS — G8929 Other chronic pain: Secondary | ICD-10-CM | POA: Diagnosis not present

## 2018-10-11 DIAGNOSIS — Z79899 Other long term (current) drug therapy: Secondary | ICD-10-CM | POA: Diagnosis not present

## 2018-10-11 DIAGNOSIS — M5126 Other intervertebral disc displacement, lumbar region: Secondary | ICD-10-CM | POA: Diagnosis not present

## 2018-10-19 ENCOUNTER — Other Ambulatory Visit: Payer: Self-pay

## 2018-10-19 ENCOUNTER — Encounter: Payer: Self-pay | Admitting: Family Medicine

## 2018-10-19 ENCOUNTER — Ambulatory Visit (INDEPENDENT_AMBULATORY_CARE_PROVIDER_SITE_OTHER): Payer: PPO | Admitting: Family Medicine

## 2018-10-19 DIAGNOSIS — M7661 Achilles tendinitis, right leg: Secondary | ICD-10-CM | POA: Diagnosis not present

## 2018-10-21 NOTE — Assessment & Plan Note (Addendum)
Restart nitroglycerin patch therapy She still has refills F/u 4 weeks might consider custom molded orthotics in future Not sure she really completed full HEP so we discussed etiology, treatment plans Greater than 50% of our 25 minute office visit was spent in counseling and education regarding these issues.

## 2018-10-21 NOTE — Progress Notes (Signed)
  Maria [name] - 59 y.o. female MRN [id number]  Date of birth: 1960-04-17    SUBJECTIVE:      Chief Complaint:/ HPI:   right posterior  Ankle pain, same as before. Aching in nature, worse with weight bearing. Similar but not as severe as before--has not been using nitro patch recenlty No new injury    ROS:     Pertinent review of systems: negative for fever or unusual weight change. No other unusual arthralgias No redness or unusual warmth of right ankle  PERTINENT  PMH / PSH FH / / SH:  Past Medical, Surgical, Social, and Family History Reviewed & Updated in the EMR.  Pertinent findings include:  B TKR  OBJECTIVE: BP (!) 125/46   Ht 5' 8.5" (1.74 m)   Wt 282 lb (127.9 kg)   LMP 04/11/2009   BMI 42.25 kg/m   Physical Exam:  Vital signs are reviewed. GEN WD WN NAD ANKLES: B FROM. Normal plantar and dorsiflexion ROM and strength. On right ankle, pain with both movements Also TTP over insertional area of right achilles tendon. No deformity noted, no nodularity [place] right achilles tendon: no tendon defect. There is some thickening and increased doppler signal noted at insertion. Several osteophytes noted. No enlargemnt of retrocalcaneal bursa area  ASSESSMENT & PLAN:  See problem based charting & AVS for pt instructions.

## 2018-10-22 ENCOUNTER — Other Ambulatory Visit: Payer: Self-pay | Admitting: Family Medicine

## 2018-10-22 DIAGNOSIS — M109 Gout, unspecified: Secondary | ICD-10-CM

## 2018-10-25 DIAGNOSIS — Z780 Asymptomatic menopausal state: Secondary | ICD-10-CM | POA: Diagnosis not present

## 2018-10-25 DIAGNOSIS — M542 Cervicalgia: Secondary | ICD-10-CM | POA: Diagnosis not present

## 2018-10-29 DIAGNOSIS — Z6841 Body Mass Index (BMI) 40.0 and over, adult: Secondary | ICD-10-CM | POA: Diagnosis not present

## 2018-10-29 DIAGNOSIS — M5136 Other intervertebral disc degeneration, lumbar region: Secondary | ICD-10-CM | POA: Diagnosis not present

## 2018-10-29 DIAGNOSIS — M542 Cervicalgia: Secondary | ICD-10-CM | POA: Diagnosis not present

## 2018-10-29 DIAGNOSIS — M5030 Other cervical disc degeneration, unspecified cervical region: Secondary | ICD-10-CM | POA: Diagnosis not present

## 2018-10-29 DIAGNOSIS — M545 Low back pain: Secondary | ICD-10-CM | POA: Diagnosis not present

## 2018-11-01 ENCOUNTER — Other Ambulatory Visit: Payer: PPO

## 2018-11-05 ENCOUNTER — Other Ambulatory Visit: Payer: PPO

## 2018-11-06 ENCOUNTER — Other Ambulatory Visit: Payer: Self-pay

## 2018-11-06 ENCOUNTER — Ambulatory Visit: Payer: PPO

## 2018-11-06 ENCOUNTER — Ambulatory Visit
Admission: RE | Admit: 2018-11-06 | Discharge: 2018-11-06 | Disposition: A | Discharge status: Home / Self Care | Payer: PPO | Source: Ambulatory Visit | Attending: Nurse Practitioner | Admitting: Nurse Practitioner

## 2018-11-06 DIAGNOSIS — R928 Other abnormal and inconclusive findings on diagnostic imaging of breast: Secondary | ICD-10-CM | POA: Diagnosis not present

## 2018-11-06 DIAGNOSIS — N6452 Nipple discharge: Secondary | ICD-10-CM

## 2018-11-07 DIAGNOSIS — M5030 Other cervical disc degeneration, unspecified cervical region: Secondary | ICD-10-CM | POA: Diagnosis not present

## 2018-11-07 DIAGNOSIS — E119 Type 2 diabetes mellitus without complications: Secondary | ICD-10-CM | POA: Diagnosis not present

## 2018-11-07 DIAGNOSIS — G8929 Other chronic pain: Secondary | ICD-10-CM | POA: Diagnosis not present

## 2018-11-07 DIAGNOSIS — N898 Other specified noninflammatory disorders of vagina: Secondary | ICD-10-CM | POA: Diagnosis not present

## 2018-11-07 DIAGNOSIS — Z79899 Other long term (current) drug therapy: Secondary | ICD-10-CM | POA: Diagnosis not present

## 2018-11-07 DIAGNOSIS — R8290 Unspecified abnormal findings in urine: Secondary | ICD-10-CM | POA: Diagnosis not present

## 2018-11-07 DIAGNOSIS — M545 Low back pain: Secondary | ICD-10-CM | POA: Diagnosis not present

## 2018-11-07 DIAGNOSIS — M5126 Other intervertebral disc displacement, lumbar region: Secondary | ICD-10-CM | POA: Diagnosis not present

## 2018-11-14 DIAGNOSIS — D369 Benign neoplasm, unspecified site: Secondary | ICD-10-CM | POA: Diagnosis not present

## 2018-11-14 DIAGNOSIS — Z9884 Bariatric surgery status: Secondary | ICD-10-CM | POA: Diagnosis not present

## 2018-11-19 ENCOUNTER — Other Ambulatory Visit: Payer: Self-pay | Admitting: Family Medicine

## 2018-11-19 DIAGNOSIS — M109 Gout, unspecified: Secondary | ICD-10-CM

## 2018-11-20 ENCOUNTER — Other Ambulatory Visit: Payer: Self-pay

## 2018-11-20 MED ORDER — METRONIDAZOLE 500 MG PO TABS: 500.0000 mg | ORAL_TABLET | Freq: Two times a day (BID) | ORAL | 0 refills | Status: DC

## 2018-11-20 NOTE — Telephone Encounter (Signed)
Patient left a message for flagyl to be called in for BV.Sent in to [facility] in [place].

## 2018-11-26 DIAGNOSIS — E669 Obesity, unspecified: Secondary | ICD-10-CM | POA: Diagnosis not present

## 2018-11-26 DIAGNOSIS — R0602 Shortness of breath: Secondary | ICD-10-CM | POA: Diagnosis not present

## 2018-11-26 DIAGNOSIS — I2699 Other pulmonary embolism without acute cor pulmonale: Secondary | ICD-10-CM | POA: Diagnosis not present

## 2018-12-05 DIAGNOSIS — R5383 Other fatigue: Secondary | ICD-10-CM | POA: Diagnosis not present

## 2018-12-05 DIAGNOSIS — H34811 Central retinal vein occlusion, right eye, with macular edema: Secondary | ICD-10-CM | POA: Diagnosis not present

## 2018-12-05 DIAGNOSIS — E103392 Type 1 diabetes mellitus with moderate nonproliferative diabetic retinopathy without macular edema, left eye: Secondary | ICD-10-CM | POA: Diagnosis not present

## 2018-12-05 DIAGNOSIS — M5030 Other cervical disc degeneration, unspecified cervical region: Secondary | ICD-10-CM | POA: Diagnosis not present

## 2018-12-05 DIAGNOSIS — M129 Arthropathy, unspecified: Secondary | ICD-10-CM | POA: Diagnosis not present

## 2018-12-05 DIAGNOSIS — E119 Type 2 diabetes mellitus without complications: Secondary | ICD-10-CM | POA: Diagnosis not present

## 2018-12-05 DIAGNOSIS — Z79899 Other long term (current) drug therapy: Secondary | ICD-10-CM | POA: Diagnosis not present

## 2018-12-05 DIAGNOSIS — G8929 Other chronic pain: Secondary | ICD-10-CM | POA: Diagnosis not present

## 2018-12-05 DIAGNOSIS — E559 Vitamin D deficiency, unspecified: Secondary | ICD-10-CM | POA: Diagnosis not present

## 2018-12-05 DIAGNOSIS — E7800 Pure hypercholesterolemia, unspecified: Secondary | ICD-10-CM | POA: Diagnosis not present

## 2018-12-05 DIAGNOSIS — M545 Low back pain: Secondary | ICD-10-CM | POA: Diagnosis not present

## 2018-12-05 DIAGNOSIS — Z1159 Encounter for screening for other viral diseases: Secondary | ICD-10-CM | POA: Diagnosis not present

## 2018-12-05 DIAGNOSIS — M5126 Other intervertebral disc displacement, lumbar region: Secondary | ICD-10-CM | POA: Diagnosis not present

## 2018-12-06 ENCOUNTER — Ambulatory Visit: Payer: Self-pay | Admitting: Surgery

## 2018-12-06 DIAGNOSIS — D242 Benign neoplasm of left breast: Secondary | ICD-10-CM

## 2018-12-07 ENCOUNTER — Other Ambulatory Visit: Payer: Self-pay

## 2018-12-07 ENCOUNTER — Ambulatory Visit (INDEPENDENT_AMBULATORY_CARE_PROVIDER_SITE_OTHER): Payer: PPO | Admitting: Family Medicine

## 2018-12-07 DIAGNOSIS — G5602 Carpal tunnel syndrome, left upper limb: Secondary | ICD-10-CM

## 2018-12-07 MED ORDER — METHYLPREDNISOLONE ACETATE 40 MG/ML IJ SUSP
40.0000 mg | Freq: Once | INTRAMUSCULAR | Status: AC
  Administered 2018-12-07: 40 mg via INTRA_ARTICULAR

## 2018-12-07 NOTE — Assessment & Plan Note (Signed)
I do not think these issues are coming from her neck.  I think this is carpal tunnel syndrome and we discussed at length.  She opted to go for corticosteroid injection today.  I will also recommend a cock-up splint for the left wrist at night.  Unfortunately we did not have one in stock that would fit her so I have given her instructions to buy one and use at night.  Follow-up 4 weeks, sooner with problems.  Red flags for things to watch out for after corticosteroid injection were discussed.

## 2018-12-07 NOTE — Progress Notes (Signed)
  Maria [name] - 58 y.o. female MRN [id number]  Date of birth: Oct 02, 1960    SUBJECTIVE:      Chief Complaint:/ HPI:  Left hand numbness for many months.  Worse when she first wakes up.  Thumb and first 3-4 fingers.  She is right-hand dominant.  She is concerned it is coming from some degenerative change in her neck.  Has no upper extremity numbness or tingling.  No hand weakness, no  left upper extremity weakness.   ROS:     She has lost another 12 pounds and she is very excited about this.  No unusual fever, no unusual arthralgias.  No rash.  PERTINENT  PMH / PSH FH / / SH:  Past Medical, Surgical, Social, and Family History Reviewed & Updated in the EMR.  Pertinent findings include:  Bilateral TKR History of DJD neck.  Recently had CT scan of the neck but neither those images or report are not available to me right now  OBJECTIVE: BP 137/87   Ht 5\' 8"  (1.727 m)   Wt 286 lb (129.7 kg)   LMP 04/11/2009   BMI 43.49 kg/m   Physical Exam:  Vital signs are reviewed. GENERAL: Well-developed overweight female no acute distress WRIST: Bilaterally has full range of motion flexion extension, radial and ulnar deviation.  Left wrist positive for Tinel's, positive Phalen's. VASCULAR: Radial pulses 2+ bilateral symmetrical NEURO: Intact sensation soft touch bilateral hands.  Two-point discrimination left hand intact. ULTRASOUND/INJECTION: Left median nerve is identified.  It is flattened.  There is small amount of fluid noted in the third compartment. Patient given informed consent for carpal tunnel injection under ultrasound guidance.  Signed copy in the chart.  Area prepped and draped in the usual sterile fashion.  1/2 cc of a 1 ml of 40 mg/mL methylprednisolone and 4 cc of 1% lidocaine mixture was injected using out of plane distal injection technique using ultrasound guidance into the area adjacent to the median nerve.  The median nerve was easily identified as well as the ulnar and radial  artery and these were avoided. Patient  Tolerated procedure well  ASSESSMENT & PLAN:  See problem based charting & AVS for pt instructions. No problem-specific Assessment & Plan notes found for this encounter.

## 2018-12-09 ENCOUNTER — Other Ambulatory Visit: Payer: Self-pay

## 2018-12-09 ENCOUNTER — Emergency Department (HOSPITAL_BASED_OUTPATIENT_CLINIC_OR_DEPARTMENT_OTHER)
Admission: EM | Admit: 2018-12-09 | Discharge: 2018-12-10 | Disposition: A | Discharge status: Home / Self Care | Payer: PPO | Attending: Emergency Medicine | Admitting: Emergency Medicine

## 2018-12-09 ENCOUNTER — Encounter (HOSPITAL_BASED_OUTPATIENT_CLINIC_OR_DEPARTMENT_OTHER): Payer: Self-pay

## 2018-12-09 DIAGNOSIS — R079 Chest pain, unspecified: Secondary | ICD-10-CM | POA: Diagnosis not present

## 2018-12-09 DIAGNOSIS — R0602 Shortness of breath: Secondary | ICD-10-CM | POA: Insufficient documentation

## 2018-12-09 DIAGNOSIS — Z79899 Other long term (current) drug therapy: Secondary | ICD-10-CM | POA: Insufficient documentation

## 2018-12-09 DIAGNOSIS — R42 Dizziness and giddiness: Secondary | ICD-10-CM | POA: Diagnosis present

## 2018-12-09 DIAGNOSIS — I10 Essential (primary) hypertension: Secondary | ICD-10-CM | POA: Diagnosis not present

## 2018-12-09 DIAGNOSIS — R55 Syncope and collapse: Secondary | ICD-10-CM

## 2018-12-09 DIAGNOSIS — R911 Solitary pulmonary nodule: Secondary | ICD-10-CM

## 2018-12-09 DIAGNOSIS — E041 Nontoxic single thyroid nodule: Secondary | ICD-10-CM

## 2018-12-09 DIAGNOSIS — E119 Type 2 diabetes mellitus without complications: Secondary | ICD-10-CM | POA: Insufficient documentation

## 2018-12-09 DIAGNOSIS — Z794 Long term (current) use of insulin: Secondary | ICD-10-CM | POA: Insufficient documentation

## 2018-12-09 NOTE — ED Triage Notes (Signed)
Pt c/o lightheadedness x 1 week that has gradually gotten worse. Pt has associated exertional dyspnea, and a couple "twinges" of CP throughout the week.

## 2018-12-10 ENCOUNTER — Other Ambulatory Visit: Payer: Self-pay | Admitting: Surgery

## 2018-12-10 ENCOUNTER — Emergency Department (HOSPITAL_BASED_OUTPATIENT_CLINIC_OR_DEPARTMENT_OTHER): Payer: PPO

## 2018-12-10 ENCOUNTER — Encounter (HOSPITAL_BASED_OUTPATIENT_CLINIC_OR_DEPARTMENT_OTHER): Payer: Self-pay

## 2018-12-10 DIAGNOSIS — D242 Benign neoplasm of left breast: Secondary | ICD-10-CM

## 2018-12-10 DIAGNOSIS — R079 Chest pain, unspecified: Secondary | ICD-10-CM | POA: Diagnosis not present

## 2018-12-10 LAB — CBC WITH DIFFERENTIAL/PLATELET
Abs Immature Granulocytes: 0.01 10*3/uL (ref 0.00–0.07)
Basophils Absolute: 0 10*3/uL (ref 0.0–0.1)
Basophils Relative: 1 %
Eosinophils Absolute: 0.2 10*3/uL (ref 0.0–0.5)
Eosinophils Relative: 3 %
HCT: 38.4 % (ref 36.0–46.0)
Hemoglobin: 12.3 g/dL (ref 12.0–15.0)
Immature Granulocytes: 0 %
Lymphocytes Relative: 57 %
Lymphs Abs: 3.9 10*3/uL (ref 0.7–4.0)
MCH: 28.2 pg (ref 26.0–34.0)
MCHC: 32 g/dL (ref 30.0–36.0)
MCV: 88.1 fL (ref 80.0–100.0)
Monocytes Absolute: 0.5 10*3/uL (ref 0.1–1.0)
Monocytes Relative: 8 %
Neutro Abs: 2.1 10*3/uL (ref 1.7–7.7)
Neutrophils Relative %: 31 %
Platelets: 256 10*3/uL (ref 150–400)
RBC: 4.36 MIL/uL (ref 3.87–5.11)
RDW: 13.7 % (ref 11.5–15.5)
WBC: 6.8 10*3/uL (ref 4.0–10.5)
nRBC: 0 % (ref 0.0–0.2)

## 2018-12-10 LAB — BASIC METABOLIC PANEL
Anion gap: 11 (ref 5–15)
BUN: 16 mg/dL (ref 6–20)
CO2: 29 mmol/L (ref 22–32)
Calcium: 9 mg/dL (ref 8.9–10.3)
Chloride: 102 mmol/L (ref 98–111)
Creatinine, Ser: 0.97 mg/dL (ref 0.44–1.00)
GFR calc Af Amer: >60 mL/min (ref >60)
GFR calc non Af Amer: >60 mL/min (ref >60)
Glucose, Bld: 116 mg/dL — ABNORMAL HIGH (ref 70–99)
Potassium: 3.6 mmol/L (ref 3.5–5.1)
Sodium: 142 mmol/L (ref 135–145)

## 2018-12-10 LAB — TROPONIN I (HIGH SENSITIVITY)
Troponin I (High Sensitivity): 5 ng/L (ref <18)
Troponin I (High Sensitivity): 5 ng/L (ref <18)

## 2018-12-10 MED ORDER — IOHEXOL 350 MG/ML SOLN
100.0000 mL | Freq: Once | INTRAVENOUS | Status: AC | PRN
  Administered 2018-12-10: 100 mL via INTRAVENOUS

## 2018-12-10 MED ORDER — SODIUM CHLORIDE 0.9 % IV BOLUS
1000.0000 mL | Freq: Once | INTRAVENOUS | Status: AC
  Administered 2018-12-10: 1000 mL via INTRAVENOUS

## 2018-12-10 NOTE — Discharge Instructions (Addendum)
You were seen today for near syncope.  Your work-up is largely reassuring.  You may have some dehydration.  Make sure that you are staying hydrated.  Your CT scan did not show any evidence of PE.  However, you were incidentally found to have a pulmonary nodule and a thyroid nodule.  You need repeat CT in 6 to 12 months and thyroid ultrasound as an outpatient.  Follow-up with your primary physician to get these scheduled.

## 2018-12-10 NOTE — ED Provider Notes (Signed)
[facility] EMERGENCY DEPARTMENT Provider Note   CSN: [id number] Arrival date & time: 12/09/18  2252     History   Chief Complaint Chief Complaint  Patient presents with   Dizziness    HPI Maria [name] is a 58 y.o. female.     HPI  This a 58 year old female with a history of diabetes, anemia, reflux, PE, hypertension who presents with lightheadedness.  Patient reports she has had several episodes in the last week where she has felt lightheaded and "like I was going to pass out."  She describes laying back to get her hair washed and feeling lightheaded.  She denies room spinning.  She does report several discrete episodes of sharp self-limited chest pain this week.  She also reports some shortness of breath.  Denies any recent fevers or cough.  No known sick contacts.  She does have a history of PE but is no longer on anticoagulants.  She states that she kind of felt "funny" like this when she had a PE.  She denies any weakness, numbness, strokelike symptoms.  Past Medical History:  Diagnosis Date   Allergic rhinitis    Anemia    1980's   Anxiety    Carpal tunnel syndrome, left    pt denies   Depression    Diabetes mellitus    has not taken medications since 07/2016 after bariatric surgery   DUB (dysfunctional uterine bleeding)    Dysrhythmia    tachycardic   External hemorrhoid    GERD (gastroesophageal reflux disease)    Gout    History of hiatal hernia    History of kidney stones    Hypertension    Morbid obesity ([place])    Obesity    Osteoarthritis, knee    Pneumonia    Recurrent UTI    Rotator cuff syndrome of left shoulder     Patient Active Problem List   Diagnosis Date Noted   Achilles tendinitis of right lower extremity 12/23/2017   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   Cervical disc disorder with radiculopathy of cervical region 02/05/2016   Osteoarthritis of left hip 08/22/2014   Radicular pain  in left arm 09/27/2013   Rotator cuff syndrome 06/29/2011   S/P TKR (total knee replacement) 01/17/2011   GOUT, UNSPECIFIED 10/09/2009   HEMORRHOIDS, EXTERNAL 06/17/2009   Depression 04/23/2009   NEPHROLITHIASIS, RECURRENT 04/23/2009   Carpal tunnel syndrome on left 01/26/2009   Disorder of bursae and tendons in shoulder region 09/19/2008   GERD 02/25/2008   Diabetes type 2, uncontrolled ([place]) 04/26/2006   Hyperlipidemia 04/26/2006   ANXIETY STATE NOS 04/26/2006   Obstructive sleep apnea 04/26/2006   MORBID OBESITY 01/11/2006   Essential hypertension 01/11/2006   ALLERGIC RHINITIS 01/11/2006   DYSFUNCTIONAL UTERINE BLEEDING 01/11/2006    Past Surgical History:  Procedure Laterality Date   CHOLECYSTECTOMY     INCISION AND DRAINAGE ABSCESS N/A 06/01/2014   Procedure: INCISION AND DRAINAGE ABSCESS;  Surgeon: Rolm Bookbinder, MD;  Location: [facility];  Service: General;  Laterality: N/A;   IR GENERIC HISTORICAL  05/18/2016   IR EPIDUROGRAPHY 05/18/2016 San Morelle, MD [facility]   JOINT REPLACEMENT     LAPAROSCOPIC GASTRIC SLEEVE RESECTION WITH HIATAL HERNIA REPAIR N/A 07/26/2016   Procedure: LAPAROSCOPIC GASTRIC SLEEVE RESECTION WITH HIATAL HERNIA REPAIR, UPPER ENDOSCOPY;  Surgeon: Johnathan Hausen, MD;  Location: [facility];  Service: General;  Laterality: N/A;   LITHOTRIPSY     PULMONARY THROMBECTOMY N/A 03/19/2018   Procedure:  PULMONARY THROMBECTOMY;  Surgeon: Algernon Huxley, MD;  Location: [facility];  Service: Cardiovascular;  Laterality: N/A;   REPLACEMENT TOTAL KNEE BILATERAL     TUBAL LIGATION       OB History    Gravida  4   Para      Term      Preterm      AB  1   Living  2     SAB  1   TAB      Ectopic      Multiple      Live Births  3        Obstetric Comments  Patient had two twin pregnancies.  One Pregnancy resulted in miscarriage completely and second twin pregnancy resulted in one live birth.  She also had a  son that was killed in the [employer]         Home Medications    Prior to Admission medications   Medication Sig Start Date End Date Taking? Authorizing Provider  benazepril (LOTENSIN) 40 MG tablet Take 40 mg by mouth daily.  07/06/16  Yes [provider]  Calcium-Vitamin D-Vitamin K (CALCIUM + D) [id number] MG-UNT-MCG CHEW Chew 2 each by mouth daily.   Yes [provider]  Colchicine 0.6 MG CAPS TAKE 1 CAPSULE BY MOUTH ONCE OR TWICE DAILY FOR] 3 DAYS DURING A GOUT FLARE AS DIRECTED 10/24/18  Yes Chambliss, Jeb Levering, MD  furosemide (LASIX) 20 MG tablet Take 20 mg by mouth daily.  12/28/15  Yes [provider]  glipiZIDE (GLUCOTROL) 5 MG tablet Take 5 mg by mouth daily.  07/13/17  Yes [provider]  metFORMIN (GLUCOPHAGE) 850 MG tablet Take 850 mg by mouth 2 (two) times daily.  07/13/17  Yes [provider]  metoprolol (TOPROL-XL) 200 MG 24 hr tablet Take 200 mg by mouth daily.    Yes [provider]  Multiple Vitamins-Minerals (MULTIVITAMIN WITH MINERALS) tablet Take 1 tablet by mouth daily.   Yes [provider]  oxyCODONE (ROXICODONE) 15 MG immediate release tablet Take 1 tablet (15 mg total) by mouth every 6 (six) hours as needed for pain. 03/22/18  Yes Mody, Ulice Bold, MD  pantoprazole (PROTONIX) 40 MG tablet Take 40 mg by mouth daily.    Yes [provider]  SEMAGLUTIDE, 1 MG/DOSE, [place] Inject 1 mg into the skin once a week.   Yes [provider]  fluconazole (DIFLUCAN) 150 MG tablet Take 1 tablet (150 mg total) by mouth every 3 (three) days. For three doses, repeat in one week if not better 04/10/18   Donnamae Jude, MD  LANTUS SOLOSTAR 100 UNIT/ML Solostar Pen Inject 10 Units into the skin at bedtime.  07/19/17   [provider]  metroNIDAZOLE (FLAGYL) 500 MG tablet Take 1 tablet (500 mg total) by mouth 2 (two) times daily. 11/20/18   Anyanwu, Sallyanne Havers, MD  nitroGLYCERIN (NITRODUR - DOSED IN MG/24 HR) 0.2  mg/hr patch Use 1/4 patch daily to the affected area. 02/02/18   Thurman Coyer, DO    Family History Family History  Problem Relation Age of Onset   Coronary artery disease Other    Hypertension Other    Diabetes Other    Obesity Other    Diabetes Mother    Hypertension Mother    Congestive Heart Failure Mother     Social History Social History   Tobacco Use   Smoking status: Never Smoker   Smokeless tobacco: Never  Used  Substance Use Topics   Alcohol use: No   Drug use: No     Allergies   Propoxyphene n-acetaminophen   Review of Systems Review of Systems  Constitutional: Negative for fever.  Respiratory: Positive for shortness of breath. Negative for cough.   Cardiovascular: Positive for chest pain. Negative for palpitations and leg swelling.  Gastrointestinal: Negative for abdominal pain, nausea and vomiting.  Genitourinary: Negative for dysuria.  Neurological: Positive for dizziness and light-headedness. Negative for weakness, numbness and headaches.  All other systems reviewed and are negative.    Physical Exam Updated Vital Signs BP (!) 141/69 (BP Location: Left Arm)    Pulse 79    Temp 99.1 F (37.3 C) (Oral)    Resp 20    Ht 1.727 m (5\' 8" )    Wt (!) 144.2 kg    LMP 04/11/2009    SpO2 98%    BMI 48.35 kg/m   Physical Exam Vitals signs and nursing note reviewed.  Constitutional:      Appearance: She is well-developed. She is obese.  HENT:     Head: Normocephalic and atraumatic.     Mouth/Throat:     Mouth: Mucous membranes are moist.  Eyes:     Extraocular Movements: Extraocular movements intact.     Pupils: Pupils are equal, round, and reactive to light.  Neck:     Musculoskeletal: Neck supple.  Cardiovascular:     Rate and Rhythm: Normal rate and regular rhythm.     Heart sounds: Normal heart sounds.  Pulmonary:     Effort: Pulmonary effort is normal. No respiratory distress.     Breath sounds: No wheezing.  Abdominal:      General: Bowel sounds are normal.     Palpations: Abdomen is soft.     Tenderness: There is no abdominal tenderness.  Musculoskeletal:        General: No tenderness.     Right lower leg: No edema.     Left lower leg: No edema.  Skin:    General: Skin is warm and dry.  Neurological:     Mental Status: She is alert and oriented to person, place, and time.     Comments: Cranial nerves II through XII intact, 5 out of 5 strength in all 4 extremities, no dysmetria to finger-nose-finger, no drift  Psychiatric:        Mood and Affect: Mood normal.      ED Treatments / Results  Labs (all labs ordered are listed, but only abnormal results are displayed) Labs Reviewed  BASIC METABOLIC PANEL - Abnormal; Notable for the following components:      Result Value   Glucose, Bld 116 (*)    All other components within normal limits  CBC WITH DIFFERENTIAL/PLATELET  TROPONIN I (HIGH SENSITIVITY)  TROPONIN I (HIGH SENSITIVITY)    EKG EKG Interpretation  Date/Time:  Sunday December 09 2018 23:11:59 EDT Ventricular Rate:  81 PR Interval:    QRS Duration: 102 QT Interval:  382 QTC Calculation: 444 R Axis:   -19 Text Interpretation:  Sinus rhythm Borderline left axis deviation Confirmed by Thayer Jew [phone]) on 12/09/2018 11:51:31 PM   Radiology Dg Chest 2 View  Result Date: 12/10/2018 CLINICAL DATA:  Chest pain. Lightheaded. EXAM: CHEST - 2 VIEW COMPARISON:  Radiograph 03/19/2018, CTA 06/27/2018 FINDINGS: The cardiomediastinal contours are unchanged, borderline cardiomegaly. The lungs are clear. Pulmonary vasculature is normal. No consolidation, pleural effusion, or pneumothorax. No acute osseous abnormalities are seen. IMPRESSION:  No acute pulmonary process. Electronically Signed   By: Keith Rake M.D.   On: 12/10/2018 00:29   Ct Angio Chest Pe W And/or Wo Contrast  Result Date: 12/10/2018 CLINICAL DATA:  Chest pain. Pulmonary embolus suspected, high pretest probability. History of  pulmonary embolus. EXAM: CT ANGIOGRAPHY CHEST WITH CONTRAST TECHNIQUE: Multidetector CT imaging of the chest was performed using the standard protocol during bolus administration of intravenous contrast. Multiplanar CT image reconstructions and MIPs were obtained to evaluate the vascular anatomy. CONTRAST:  146mL OMNIPAQUE IOHEXOL 350 MG/ML SOLN COMPARISON:  Radiograph earlier this day. Chest CTA 06/27/2018, 03/19/2018 FINDINGS: Cardiovascular: There are no filling defects within the pulmonary arteries to the segmental level to suggest pulmonary embolus. Subsegmental branches cannot be assessed due to contrast bolus timing and soft tissue attenuation from habitus. Mild cardiomegaly. No pericardial effusion. Thoracic aorta is normal in caliber. Common origin of the brachiocephalic and left common carotid artery, variant arch anatomy. Mediastinum/Nodes: No enlarged mediastinal or hilar lymph nodes. Esophagus is decompressed. Small to moderate hiatal hernia. Gastric staple line herniates into the lower thorax. Heterogeneously enlarged thyroid gland, probable 1.8 cm nodule in the lower right thyroid gland. Lungs/Pleura: Single subcentimeter ground-glass opacity in the anterior right upper lobe, series 5, image 43. Lungs are otherwise clear. No pulmonary edema. No pleural effusion. Upper Abdomen: Prior gastric sleeve. No acute findings. Musculoskeletal: Degenerative change in the spine. Similar ill-defined sclerosis involving the medial right posterior eighth rib, stable dating back to 2015. Review of the MIP images confirms the above findings. IMPRESSION: 1. No pulmonary embolus. 2. Single subcentimeter ground-glass nodular opacity in the anterior right upper lobe, may be infectious or inflammatory. This was not present on chest CT 5 months ago which argues against neoplastic etiology. Initial follow-up with CT at 6-12 months is recommended to confirm persistence. If persistent, repeat CT is recommended every 2 years  until 5 years of stability has been established. This recommendation follows the consensus statement: Guidelines for Management of Incidental Pulmonary Nodules Detected on CT Images: From the Fleischner Society 2017; Radiology 2017; 284:228-243. Mild cardiomegaly. 3. Heterogeneously enlarged thyroid gland with probable 1.8 cm nodule in the lower right thyroid gland. Consider nonemergent thyroid ultrasound for characterization. Electronically Signed   By: Keith Rake M.D.   On: 12/10/2018 02:19    Procedures Procedures (including critical care time)  Medications Ordered in ED Medications  sodium chloride 0.9 % bolus 1,000 mL (1,000 mLs Intravenous New Bag/Given 12/10/18 0130)  iohexol (OMNIPAQUE) 350 MG/ML injection 100 mL (100 mLs Intravenous Contrast Given 12/10/18 0140)     Initial Impression / Assessment and Plan / ED Course  I have reviewed the triage vital signs and the nursing notes.  Pertinent labs & imaging results that were available during my care of the patient were reviewed by me and considered in my medical decision making (see chart for details).        Patient presents with near syncope.  She describes lightheadedness.  She denies room spinning dizziness.  Neurologic exam is reassuring.  Vital signs are also largely reassuring.  She reports she had similar symptoms with prior PE.  She does report some intermittent chest pain although none right now and some shortness of breath.  Work-up initiated.  EKG without ischemia or arrhythmia.  Chest x-ray without pneumothorax or pneumonia.  Lab work including troponin x2 is reassuring.  CT scan obtained given history of PE and similar symptoms, no evidence of PE but there is an incidental pulmonary  nodule as well as a thyroid nodule.  Patient denies any infectious symptoms.  Orthostatics were obtained.  She does increase heart rate upon standing.  Patient was given a liter of fluids and states she feels much better and less  lightheaded.  Suspect she may have an element of dehydration.  I discussed with patient her incidental findings and need for follow-up with repeat CT in 6 to 12 months and thyroid ultrasound.  Patient stated understanding.  Will discharge home with PCP follow-up.  After history, exam, and medical workup I feel the patient has been appropriately medically screened and is safe for discharge home. Pertinent diagnoses were discussed with the patient. Patient was given return precautions.   Final Clinical Impressions(s) / ED Diagnoses   Final diagnoses:  Near syncope  Thyroid nodule  Pulmonary nodule    ED Discharge Orders    None       Dhruvi Crenshaw, Barbette Hair, MD 12/10/18 [phone]

## 2018-12-10 NOTE — ED Notes (Signed)
Back from CT

## 2018-12-11 DIAGNOSIS — Z7689 Persons encountering health services in other specified circumstances: Secondary | ICD-10-CM | POA: Diagnosis not present

## 2018-12-11 DIAGNOSIS — R42 Dizziness and giddiness: Secondary | ICD-10-CM | POA: Diagnosis not present

## 2018-12-11 DIAGNOSIS — Z09 Encounter for follow-up examination after completed treatment for conditions other than malignant neoplasm: Secondary | ICD-10-CM | POA: Diagnosis not present

## 2018-12-11 DIAGNOSIS — E041 Nontoxic single thyroid nodule: Secondary | ICD-10-CM | POA: Diagnosis not present

## 2018-12-11 DIAGNOSIS — E119 Type 2 diabetes mellitus without complications: Secondary | ICD-10-CM | POA: Diagnosis not present

## 2018-12-11 DIAGNOSIS — R911 Solitary pulmonary nodule: Secondary | ICD-10-CM | POA: Diagnosis not present

## 2018-12-11 DIAGNOSIS — R55 Syncope and collapse: Secondary | ICD-10-CM | POA: Diagnosis not present

## 2018-12-13 DIAGNOSIS — R42 Dizziness and giddiness: Secondary | ICD-10-CM | POA: Diagnosis not present

## 2018-12-13 DIAGNOSIS — R51 Headache: Secondary | ICD-10-CM | POA: Diagnosis not present

## 2018-12-13 DIAGNOSIS — E041 Nontoxic single thyroid nodule: Secondary | ICD-10-CM | POA: Diagnosis not present

## 2018-12-18 DIAGNOSIS — R918 Other nonspecific abnormal finding of lung field: Secondary | ICD-10-CM | POA: Diagnosis not present

## 2018-12-19 ENCOUNTER — Telehealth: Payer: Self-pay

## 2018-12-19 ENCOUNTER — Other Ambulatory Visit: Payer: Self-pay

## 2018-12-19 DIAGNOSIS — M109 Gout, unspecified: Secondary | ICD-10-CM

## 2018-12-19 MED ORDER — COLCHICINE 0.6 MG PO CAPS: ORAL_CAPSULE | ORAL | 0 refills | Status: DC

## 2018-12-19 NOTE — Telephone Encounter (Signed)
Refill sent to pharmacy.   

## 2018-12-26 ENCOUNTER — Other Ambulatory Visit: Payer: Self-pay | Admitting: Obstetrics & Gynecology

## 2019-01-02 DIAGNOSIS — N898 Other specified noninflammatory disorders of vagina: Secondary | ICD-10-CM | POA: Diagnosis not present

## 2019-01-02 DIAGNOSIS — R3989 Other symptoms and signs involving the genitourinary system: Secondary | ICD-10-CM | POA: Diagnosis not present

## 2019-01-02 DIAGNOSIS — Z23 Encounter for immunization: Secondary | ICD-10-CM | POA: Diagnosis not present

## 2019-01-02 DIAGNOSIS — M5030 Other cervical disc degeneration, unspecified cervical region: Secondary | ICD-10-CM | POA: Diagnosis not present

## 2019-01-02 DIAGNOSIS — M5126 Other intervertebral disc displacement, lumbar region: Secondary | ICD-10-CM | POA: Diagnosis not present

## 2019-01-02 DIAGNOSIS — Z79899 Other long term (current) drug therapy: Secondary | ICD-10-CM | POA: Diagnosis not present

## 2019-01-02 DIAGNOSIS — G8929 Other chronic pain: Secondary | ICD-10-CM | POA: Diagnosis not present

## 2019-01-02 DIAGNOSIS — M545 Low back pain: Secondary | ICD-10-CM | POA: Diagnosis not present

## 2019-01-04 NOTE — Pre-Procedure Instructions (Signed)
Maria [name]  01/04/2019      [id number] - [name], [place] [address] Phone: [phone] Fax: [place] - [address] [place] [address] Phone: [phone] Fax: [phone]  [place], [place] - [place] [address] Phone: [phone] Fax: [phone]    Your procedure is scheduled on October 2nd, 2020.  Report to [facility] Admitting at 05:30 A.M.  Call this number if you have problems the morning of surgery:  [phone]   Remember:  Do not eat or drink after midnight.    Take these medicines the morning of surgery with A SIP OF WATER: Oxycodone as needed.    Do not wear jewelry, make-up or nail polish.  Do not wear lotions, powders, or perfumes, or deodorant.  Do not shave 48 hours prior to surgery.  Men may shave face and neck.  Do not bring valuables to the hospital.  [facility] is not responsible for any belongings or valuables.  Contacts, dentures or bridgework may not be worn into surgery.  Leave your suitcase in the car.  After surgery it may be brought to your room.  For patients admitted to the hospital, discharge time will be determined by your treatment team.  Patients discharged the day of surgery will not be allowed to drive home.    [facility]- Preparing For Surgery  Before surgery, you can play an important role. Because skin is not sterile, your skin needs to be as free of germs as possible. You can reduce the number of germs on your skin by washing with CHG (chlorahexidine gluconate) Soap before surgery.  CHG is an antiseptic cleaner which kills germs and bonds with the skin to continue killing germs even after washing.    Oral Hygiene is also important to reduce your risk of infection.   Remember - BRUSH YOUR TEETH THE MORNING OF SURGERY WITH YOUR REGULAR TOOTHPASTE  Please do not use if you have an allergy to CHG or antibacterial soaps. If your skin becomes reddened/irritated stop using the CHG.  Do not shave (including legs and underarms) for at least 48 hours prior to first CHG shower. It is OK to shave your face.  Please follow these instructions carefully.   1. Shower the NIGHT BEFORE SURGERY and the MORNING OF SURGERY with CHG.   2. If you chose to wash your hair, wash your hair first as usual with your normal shampoo.  3. After you shampoo, rinse your hair and body thoroughly to remove the shampoo.  4. Use CHG as you would any other liquid soap. You can apply CHG directly to the skin and wash gently with a scrungie or a clean washcloth.   5. Apply the CHG Soap to your body ONLY FROM THE NECK DOWN.  Do not use on open wounds or open sores. Avoid contact with your eyes, ears, mouth and genitals (private parts). Wash Face and genitals (private parts)  with your normal soap.  6. Wash thoroughly, paying special attention to the area where your surgery will be performed.  7. Thoroughly rinse your body with warm water from the neck down.  8. DO NOT shower/wash with your normal soap after using and rinsing off the CHG Soap.  9. Pat yourself dry  with a CLEAN TOWEL.  10. Wear CLEAN PAJAMAS to bed the night before surgery, wear comfortable clothes the morning of surgery  11. Place CLEAN SHEETS on your bed the night of your first shower and DO NOT SLEEP WITH PETS.    Day of Surgery:  Do not apply any deodorants/lotions.  Please wear clean clothes to the hospital/surgery center.   Remember to brush your teeth WITH YOUR REGULAR TOOTHPASTE.

## 2019-01-07 ENCOUNTER — Other Ambulatory Visit: Payer: Self-pay

## 2019-01-07 ENCOUNTER — Encounter (HOSPITAL_COMMUNITY)
Admission: RE | Admit: 2019-01-07 | Discharge: 2019-01-07 | Disposition: A | Discharge status: Home / Self Care | Payer: PPO | Source: Ambulatory Visit | Attending: Surgery | Admitting: Surgery

## 2019-01-07 ENCOUNTER — Encounter (HOSPITAL_COMMUNITY): Payer: Self-pay

## 2019-01-07 DIAGNOSIS — Z01812 Encounter for preprocedural laboratory examination: Secondary | ICD-10-CM | POA: Insufficient documentation

## 2019-01-07 HISTORY — DX: Nontoxic multinodular goiter: E04.2

## 2019-01-07 LAB — CBC
HCT: 40.5 % (ref 36.0–46.0)
Hemoglobin: 12.6 g/dL (ref 12.0–15.0)
MCH: 27.8 pg (ref 26.0–34.0)
MCHC: 31.1 g/dL (ref 30.0–36.0)
MCV: 89.4 fL (ref 80.0–100.0)
Platelets: 287 10*3/uL (ref 150–400)
RBC: 4.53 MIL/uL (ref 3.87–5.11)
RDW: 14.9 % (ref 11.5–15.5)
WBC: 6.1 10*3/uL (ref 4.0–10.5)
nRBC: 0 % (ref 0.0–0.2)

## 2019-01-07 LAB — GLUCOSE, CAPILLARY: Glucose-Capillary: 143 mg/dL — ABNORMAL HIGH (ref 70–99)

## 2019-01-07 LAB — BASIC METABOLIC PANEL
Anion gap: 12 (ref 5–15)
BUN: 13 mg/dL (ref 6–20)
CO2: 28 mmol/L (ref 22–32)
Calcium: 9.1 mg/dL (ref 8.9–10.3)
Chloride: 102 mmol/L (ref 98–111)
Creatinine, Ser: 1.15 mg/dL — ABNORMAL HIGH (ref 0.44–1.00)
GFR calc Af Amer: >60 mL/min (ref >60)
GFR calc non Af Amer: 52 mL/min — ABNORMAL LOW (ref >60)
Glucose, Bld: 116 mg/dL — ABNORMAL HIGH (ref 70–99)
Potassium: 3.8 mmol/L (ref 3.5–5.1)
Sodium: 142 mmol/L (ref 135–145)

## 2019-01-07 NOTE — Progress Notes (Signed)
[id number] Maria [name], [place] [address] Phone: [phone] Fax: [place] [id number], [place] - [address] AT [place] [place] [address] Phone: [phone] Fax: [phone]  [place], [place] [address] Phone: [phone] Fax: [phone]      Your procedure is scheduled on January 11, 2019.  Report to [facility] Main Entrance "A" at 05:30 A.M., and check in at the Admitting office.  Call this number if you have problems the morning of surgery:  [phone]  Call [phone] if you have any questions prior to your surgery date Monday-Friday 8am-4pm   Remember:  Do not eat after midnight the night before your surgery  You may drink clear liquids until 04:30am the morning of your surgery.   Clear liquids allowed are: Water, Non-Citrus Juices (without pulp), Carbonated Beverages, Clear Tea, Black Coffee Only, and Gatorade    Take these medicines the morning of surgery with A SIP OF WATER : Oxycodone (Roxicodone) Eye drops if needed  If dose missed the night before surgery, PLEASE take Metoprolol (Lopressor) and Pantropazole (Protonix) the morning of surgery   WHAT DO I DO ABOUT MY DIABETES MEDICATION?  DO NOT TAKE Glipizide (Glucotrol) the evening before your surgery.    . Do not take oral diabetes medicines (pills) the morning of surgery. DO NOT take Metformin (Glucophage) the morning of surgery.  . The day of surgery, do not take other diabetes injectables, including Byetta (exenatide), Bydureon (exenatide ER), Victoza (liraglutide), or Trulicity (dulaglutide).  . If your CBG is greater than 220 mg/dL, you may take  of your sliding scale (correction) dose of insulin.   How to Manage Your Diabetes Before and After Surgery  Why is it  important to control my blood sugar before and after surgery? . Improving blood sugar levels before and after surgery helps healing and can limit problems. . A way of improving blood sugar control is eating a healthy diet by: o  Eating less sugar and carbohydrates o  Increasing activity/exercise o  Talking with your doctor about reaching your blood sugar goals . High blood sugars (greater than 180 mg/dL) can raise your risk of infections and slow your recovery, so you will need to focus on controlling your diabetes during the weeks before surgery. . Make sure that the doctor who takes care of your diabetes knows about your planned surgery including the date and location.  How do I manage my blood sugar before surgery? . Check your blood sugar at least 4 times a day, starting 2 days before surgery, to make sure that the level is not too high or low. o Check your blood sugar the morning of your surgery when you wake up and every 2 hours until you get to the Short Stay unit. . If your blood sugar is less than 70 mg/dL, you will need to treat for low blood sugar: o Do not take insulin. o Treat a low blood sugar (less than 70 mg/dL) with  cup of clear juice (cranberry or apple), 4 glucose tablets, OR glucose gel. o Recheck blood sugar in 15 minutes after treatment (to make sure it is greater than 70 mg/dL). If your blood sugar is not greater than 70 mg/dL on recheck, call [phone] for further instructions. . Report your blood  sugar to the short stay nurse when you get to Short Stay.  . If you are admitted to the hospital after surgery: o Your blood sugar will be checked by the staff and you will probably be given insulin after surgery (instead of oral diabetes medicines) to make sure you have good blood sugar levels. o The goal for blood sugar control after surgery is 80-180 mg/dL.    7 days prior to surgery STOP taking any Aspirin (unless otherwise instructed by your surgeon), Aleve,  Naproxen, Ibuprofen, Motrin, Advil, Goody's, BC's, all herbal medications, fish oil, and all vitamins.    The Morning of Surgery  Do not wear jewelry, make-up or nail polish.  Do not wear lotions, powders, or perfumes/colognes, or deodorant  Do not shave 48 hours prior to surgery.  Men may shave face and neck.  Do not bring valuables to the hospital.  [facility] is not responsible for any belongings or valuables.  If you are a smoker, DO NOT Smoke 24 hours prior to surgery IF you wear a CPAP at night please bring your mask, tubing, and machine the morning of surgery   Remember that you must have someone to transport you home after your surgery, and remain with you for 24 hours if you are discharged the same day.   Contacts, glasses, hearing aids, dentures or bridgework may not be worn into surgery.    Leave your suitcase in the car.  After surgery it may be brought to your room.  For patients admitted to the hospital, discharge time will be determined by your treatment team.  Patients discharged the day of surgery will not be allowed to drive home.    Special instructions:   [facility]- Preparing For Surgery  Before surgery, you can play an important role. Because skin is not sterile, your skin needs to be as free of germs as possible. You can reduce the number of germs on your skin by washing with CHG (chlorahexidine gluconate) Soap before surgery.  CHG is an antiseptic cleaner which kills germs and bonds with the skin to continue killing germs even after washing.    Oral Hygiene is also important to reduce your risk of infection.  Remember - BRUSH YOUR TEETH THE MORNING OF SURGERY WITH YOUR REGULAR TOOTHPASTE  Please do not use if you have an allergy to CHG or antibacterial soaps. If your skin becomes reddened/irritated stop using the CHG.  Do not shave (including legs and underarms) for at least 48 hours prior to first CHG shower. It is OK to shave your face.  Please follow  these instructions carefully.   1. Shower the NIGHT BEFORE SURGERY and the MORNING OF SURGERY with CHG Soap.   2. If you chose to wash your hair, wash your hair first as usual with your normal shampoo.  3. After you shampoo, rinse your hair and body thoroughly to remove the shampoo.  4. Use CHG as you would any other liquid soap. You can apply CHG directly to the skin and wash gently with a scrungie or a clean washcloth.   5. Apply the CHG Soap to your body ONLY FROM THE NECK DOWN.  Do not use on open wounds or open sores. Avoid contact with your eyes, ears, mouth and genitals (private parts). Wash Face and genitals (private parts)  with your normal soap.   6. Wash thoroughly, paying special attention to the area where your surgery will be performed.  7. Thoroughly rinse your body with  warm water from the neck down.  8. DO NOT shower/wash with your normal soap after using and rinsing off the CHG Soap.  9. Pat yourself dry with a CLEAN TOWEL.  10. Wear CLEAN PAJAMAS to bed the night before surgery, wear comfortable clothes the morning of surgery  11. Place CLEAN SHEETS on your bed the night of your first shower and DO NOT SLEEP WITH PETS.    Day of Surgery:  Do not apply any deodorants/lotions. Please shower the morning of surgery with the CHG soap  Please wear clean clothes to the hospital/surgery center.   Remember to brush your teeth WITH YOUR REGULAR TOOTHPASTE.   Please read over the following fact sheets that you were given.

## 2019-01-07 NOTE — Progress Notes (Signed)
PCP: Simona Huh NP [facility] Cardiologist: denies  EKG: 12/11/2018 CXR: denies ECHO: 01/18/2018 Stress Test: denies Cardiac Cath: denies  Fasting Blood Sugar- unsure: doesn't have meter at home to check Last A1C: 7.8--copy placed in paper chart from PCP office  Going for Covid testing on 9/29  Patient denies shortness of breath, fever, cough, and chest pain at PAT appointment.  Patient verbalized understanding of instructions provided today at the PAT appointment.  Patient asked to review instructions at home and day of surgery.

## 2019-01-08 ENCOUNTER — Other Ambulatory Visit (HOSPITAL_COMMUNITY)
Admission: RE | Admit: 2019-01-08 | Discharge: 2019-01-08 | Disposition: A | Discharge status: Home / Self Care | Payer: PPO | Source: Ambulatory Visit | Attending: Surgery | Admitting: Surgery

## 2019-01-08 DIAGNOSIS — Z20828 Contact with and (suspected) exposure to other viral communicable diseases: Secondary | ICD-10-CM | POA: Insufficient documentation

## 2019-01-08 DIAGNOSIS — Z01812 Encounter for preprocedural laboratory examination: Secondary | ICD-10-CM | POA: Diagnosis not present

## 2019-01-09 LAB — NOVEL CORONAVIRUS, NAA (HOSP ORDER, SEND-OUT TO REF LAB; TAT 18-24 HRS): SARS-CoV-2, NAA: NOT DETECTED

## 2019-01-10 ENCOUNTER — Ambulatory Visit
Admission: RE | Admit: 2019-01-10 | Discharge: 2019-01-10 | Disposition: A | Discharge status: Home / Self Care | Payer: PPO | Source: Ambulatory Visit | Attending: Surgery | Admitting: Surgery

## 2019-01-10 ENCOUNTER — Other Ambulatory Visit: Payer: Self-pay

## 2019-01-10 DIAGNOSIS — D242 Benign neoplasm of left breast: Secondary | ICD-10-CM

## 2019-01-10 DIAGNOSIS — R921 Mammographic calcification found on diagnostic imaging of breast: Secondary | ICD-10-CM | POA: Diagnosis not present

## 2019-01-10 MED ORDER — DEXTROSE 5 % IV SOLN
3.0000 g | INTRAVENOUS | Status: DC
  Filled 2019-01-10 (×2): qty 3000

## 2019-01-10 MED ORDER — DEXTROSE 5 % IV SOLN
3.0000 g | INTRAVENOUS | Status: AC
  Administered 2019-01-11: 08:00:00 3 g via INTRAVENOUS
  Filled 2019-01-10: qty 3000
  Filled 2019-01-10: qty 3

## 2019-01-10 NOTE — Anesthesia Preprocedure Evaluation (Addendum)
Anesthesia Evaluation  Patient identified by MRN, date of birth, ID band Patient awake    Reviewed: Allergy & Precautions, NPO status , Patient's Chart, lab work & pertinent test results  Airway Mallampati: II  TM Distance: >3 FB Neck ROM: Full    Dental  (+) Dental Advisory Given, Teeth Intact   Pulmonary sleep apnea , pneumonia,    Pulmonary exam normal breath sounds clear to auscultation       Cardiovascular hypertension, Normal cardiovascular exam+ dysrhythmias  Rhythm:Regular Rate:Normal     Neuro/Psych PSYCHIATRIC DISORDERS Anxiety Depression    GI/Hepatic Neg liver ROS, hiatal hernia, GERD  ,  Endo/Other  diabetesMorbid obesity  Renal/GU Renal disease     Musculoskeletal  (+) Arthritis ,   Abdominal (+) + obese,   Peds  Hematology  (+) Blood dyscrasia, anemia ,   Anesthesia Other Findings   Reproductive/Obstetrics negative OB ROS                                                             Anesthesia Evaluation  Patient identified by MRN, date of birth, ID band Patient awake    Reviewed: Allergy & Precautions, H&P , NPO status , Patient's Chart, lab work & pertinent test results  Airway Mallampati: II   Neck ROM: full    Dental   Pulmonary sleep apnea ,    breath sounds clear to auscultation       Cardiovascular hypertension, + dysrhythmias  Rhythm:regular Rate:Normal     Neuro/Psych PSYCHIATRIC DISORDERS Anxiety Depression  Neuromuscular disease    GI/Hepatic hiatal hernia, GERD  ,  Endo/Other  diabetes, Type 2Morbid obesity  Renal/GU stones     Musculoskeletal  (+) Arthritis ,   Abdominal   Peds  Hematology   Anesthesia Other Findings   Reproductive/Obstetrics                             Anesthesia Physical Anesthesia Plan  ASA: III  Anesthesia Plan: General   Post-op Pain Management:    Induction:  Intravenous  Airway Management Planned: Oral ETT  Additional Equipment:   Intra-op Plan:   Post-operative Plan: Extubation in OR  Informed Consent: I have reviewed the patients History and Physical, chart, labs and discussed the procedure including the risks, benefits and alternatives for the proposed anesthesia with the patient or authorized representative who has indicated his/her understanding and acceptance.     Plan Discussed with: CRNA, Anesthesiologist and Surgeon  Anesthesia Plan Comments:         Anesthesia Quick Evaluation  Anesthesia Physical Anesthesia Plan  ASA: III  Anesthesia Plan: General   Post-op Pain Management:    Induction: Intravenous  PONV Risk Score and Plan: 4 or greater and Ondansetron, Dexamethasone, Midazolam, Scopolamine patch - Pre-op and Treatment may vary due to age or medical condition  Airway Management Planned: LMA  Additional Equipment: None  Intra-op Plan:   Post-operative Plan: Extubation in OR  Informed Consent: I have reviewed the patients History and Physical, chart, labs and discussed the procedure including the risks, benefits and alternatives for the proposed anesthesia with the patient or authorized representative who has indicated his/her understanding and acceptance.     Dental advisory given  Plan Discussed with:  CRNA  Anesthesia Plan Comments:         Anesthesia Quick Evaluation

## 2019-01-10 NOTE — H&P (Signed)
Maria [name] Location: [place] Office Patient #: [id number] DOB: [birth year] Divorced / Language: English / Race: Black or African American Female   History of Present Illness  The patient is a 58 year old female who presents with a breast mass. The patient is a 58 year old female presenting status-post bariatric surgery. She underwent mammography and was found to have 2 areas in her left breast that were suspicious. There were biopsied in the left breast in the upper outer quadrant posteriorly showed fibroadenomatoid change with calcifications and the left periareolar area at 12:30 showed a ductal papilloma. It was thought that these could be excised with a seed localization. I spoke with [facility] see if with her BMI of 53 height of 5 feet 7 and a weight of 342 if she would be a candidate for [facility]. She will let me know tomorrow. I would suspect she would need to see localizations to remove both of these areas. She would in addition like to have breast reduction surgery at some point. I saw her for this in 2019 and scheduled this but her HgA1C was too high. Her surgery was cancelled and then she had admission to [facility] in December with pulmonary embolism.   We need to schedule seed localization in the left breast and excision at [facility] under general. Two seeds placed yesterday.     Allergies  Propoxyphene N-APAP *ANALGESICS - OPIOID*   Medication History Malachi Bonds, CMA; 11/14/2018 3:29 PM) Multivitamin Adult (Oral) Active. Calcium Citrate (500MG  Capsule, Oral) Active. Pantoprazole Sodium (40MG  Tablet DR, Oral) Active. Metoprolol Succinate (200MG  Tablet ER, Oral) Active. Xanax (1MG  Tablet, Oral) Active. Soma (350MG  Tablet, Oral) Active. Colcrys (0.6MG  Tablet, Oral) Active. HydroDiuril (25MG  Tablet, Oral) Active. Diclofenac Sodium (1% Gel, Transdermal) Active. Colchicine (0.6MG  Capsule, Oral) Active. Furosemide (20MG  Tablet, Oral)  Active. Benazepril HCl (40MG  Tablet, Oral) Active. Ozempic (1 MG/DOSE) (2MG /1.5ML Soln Pen-inj, Subcutaneous) Active. metFORMIN HCl (850MG  Tablet, Oral) Active. Vitamin D (Ergocalciferol) (1.25 MG(50000 UT) Capsule, Oral) Active. Belbuca (300MCG Film, Buccal) Active. Medications Reconciled  Vitals (Chemira Jones CMA; 11/14/2018 3:26 PM) 11/14/2018 3:25 PM Weight: 333.6 lb Height: 67.25in Body Surface Area: 2.52 m Body Mass Index: 51.86 kg/m  Temp.: 98.42F (Oral)  Pulse: 103 (Regular)  BP: 140/80(Sitting, Left Arm, Standard)  Physical Exam  HEENT-unremarkable NEck-nodule under investigation at [facility] [place]) Chest-clear Heart-SR  Breast Note: large breasted AAF--seeds placed on the left     Assessment & Plan Rodman Key B. 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 Plan seed localization and excision at [facility] hospital because of her BMI and history of PE last December   Matt B. Hassell Done, MD, FACS

## 2019-01-11 ENCOUNTER — Encounter (HOSPITAL_COMMUNITY): Payer: Self-pay

## 2019-01-11 ENCOUNTER — Ambulatory Visit (HOSPITAL_COMMUNITY): Payer: PPO | Admitting: Anesthesiology

## 2019-01-11 ENCOUNTER — Ambulatory Visit (HOSPITAL_COMMUNITY)
Admission: RE | Admit: 2019-01-11 | Discharge: 2019-01-11 | Disposition: A | Discharge status: Home / Self Care | Payer: PPO | Attending: Surgery | Admitting: Surgery

## 2019-01-11 ENCOUNTER — Ambulatory Visit
Admission: RE | Admit: 2019-01-11 | Discharge: 2019-01-11 | Disposition: A | Discharge status: Home / Self Care | Payer: PPO | Source: Ambulatory Visit | Attending: Surgery | Admitting: Surgery

## 2019-01-11 ENCOUNTER — Encounter (HOSPITAL_COMMUNITY)
Admission: RE | Disposition: A | Discharge status: Home / Self Care | Payer: Self-pay | Source: Home / Self Care | Attending: Surgery

## 2019-01-11 DIAGNOSIS — Z9884 Bariatric surgery status: Secondary | ICD-10-CM | POA: Insufficient documentation

## 2019-01-11 DIAGNOSIS — M1990 Unspecified osteoarthritis, unspecified site: Secondary | ICD-10-CM | POA: Diagnosis not present

## 2019-01-11 DIAGNOSIS — Z7984 Long term (current) use of oral hypoglycemic drugs: Secondary | ICD-10-CM | POA: Insufficient documentation

## 2019-01-11 DIAGNOSIS — D242 Benign neoplasm of left breast: Secondary | ICD-10-CM

## 2019-01-11 DIAGNOSIS — G4730 Sleep apnea, unspecified: Secondary | ICD-10-CM | POA: Insufficient documentation

## 2019-01-11 DIAGNOSIS — Z79899 Other long term (current) drug therapy: Secondary | ICD-10-CM | POA: Insufficient documentation

## 2019-01-11 DIAGNOSIS — I10 Essential (primary) hypertension: Secondary | ICD-10-CM | POA: Diagnosis not present

## 2019-01-11 DIAGNOSIS — K219 Gastro-esophageal reflux disease without esophagitis: Secondary | ICD-10-CM | POA: Diagnosis not present

## 2019-01-11 DIAGNOSIS — D241 Benign neoplasm of right breast: Secondary | ICD-10-CM | POA: Diagnosis not present

## 2019-01-11 DIAGNOSIS — Z6841 Body Mass Index (BMI) 40.0 and over, adult: Secondary | ICD-10-CM | POA: Insufficient documentation

## 2019-01-11 DIAGNOSIS — E785 Hyperlipidemia, unspecified: Secondary | ICD-10-CM | POA: Diagnosis not present

## 2019-01-11 DIAGNOSIS — R921 Mammographic calcification found on diagnostic imaging of breast: Secondary | ICD-10-CM | POA: Diagnosis not present

## 2019-01-11 DIAGNOSIS — N6489 Other specified disorders of breast: Secondary | ICD-10-CM | POA: Diagnosis not present

## 2019-01-11 DIAGNOSIS — E119 Type 2 diabetes mellitus without complications: Secondary | ICD-10-CM | POA: Diagnosis not present

## 2019-01-11 HISTORY — PX: RADIOACTIVE SEED GUIDED EXCISIONAL BREAST BIOPSY: SHX6490

## 2019-01-11 LAB — GLUCOSE, CAPILLARY
Glucose-Capillary: 115 mg/dL — ABNORMAL HIGH (ref 70–99)
Glucose-Capillary: 125 mg/dL — ABNORMAL HIGH (ref 70–99)

## 2019-01-11 SURGERY — RADIOACTIVE SEED GUIDED BREAST BIOPSY
Anesthesia: General | Site: Breast | Laterality: Left

## 2019-01-11 MED ORDER — SUCCINYLCHOLINE CHLORIDE 200 MG/10ML IV SOSY
PREFILLED_SYRINGE | INTRAVENOUS | Status: DC | PRN
  Administered 2019-01-11: 150 mg via INTRAVENOUS

## 2019-01-11 MED ORDER — HYDROMORPHONE HCL 1 MG/ML IJ SOLN
INTRAMUSCULAR | Status: AC
  Filled 2019-01-11: qty 1

## 2019-01-11 MED ORDER — MIDAZOLAM HCL 2 MG/2ML IJ SOLN
INTRAMUSCULAR | Status: AC
  Filled 2019-01-11: qty 2

## 2019-01-11 MED ORDER — CHLORHEXIDINE GLUCONATE CLOTH 2 % EX PADS: 6.0000 | MEDICATED_PAD | Freq: Once | CUTANEOUS | Status: DC

## 2019-01-11 MED ORDER — LIDOCAINE HCL (PF) 1 % IJ SOLN
INTRAMUSCULAR | Status: DC | PRN
  Administered 2019-01-11: 9 mL

## 2019-01-11 MED ORDER — PROPOFOL 10 MG/ML IV BOLUS
INTRAVENOUS | Status: AC
  Filled 2019-01-11: qty 40

## 2019-01-11 MED ORDER — FENTANYL CITRATE (PF) 250 MCG/5ML IJ SOLN
INTRAMUSCULAR | Status: AC
  Filled 2019-01-11: qty 5

## 2019-01-11 MED ORDER — DEXAMETHASONE SODIUM PHOSPHATE 10 MG/ML IJ SOLN
INTRAMUSCULAR | Status: DC | PRN
  Administered 2019-01-11: 5 mg via INTRAVENOUS

## 2019-01-11 MED ORDER — LIDOCAINE 2% (20 MG/ML) 5 ML SYRINGE
INTRAMUSCULAR | Status: DC | PRN
  Administered 2019-01-11: 100 mg via INTRAVENOUS

## 2019-01-11 MED ORDER — LIDOCAINE 2% (20 MG/ML) 5 ML SYRINGE
INTRAMUSCULAR | Status: AC
  Filled 2019-01-11: qty 5

## 2019-01-11 MED ORDER — PHENYLEPHRINE 40 MCG/ML (10ML) SYRINGE FOR IV PUSH (FOR BLOOD PRESSURE SUPPORT)
PREFILLED_SYRINGE | INTRAVENOUS | Status: DC | PRN
  Administered 2019-01-11: 40 ug via INTRAVENOUS
  Administered 2019-01-11 (×2): 80 ug via INTRAVENOUS

## 2019-01-11 MED ORDER — FENTANYL CITRATE (PF) 250 MCG/5ML IJ SOLN
INTRAMUSCULAR | Status: DC | PRN
  Administered 2019-01-11: 100 ug via INTRAVENOUS

## 2019-01-11 MED ORDER — PROPOFOL 10 MG/ML IV BOLUS
INTRAVENOUS | Status: DC | PRN
  Administered 2019-01-11: 200 mg via INTRAVENOUS

## 2019-01-11 MED ORDER — PROMETHAZINE HCL 25 MG/ML IJ SOLN
6.2500 mg | INTRAMUSCULAR | Status: DC | PRN
  Administered 2019-01-11: 6.25 mg via INTRAVENOUS

## 2019-01-11 MED ORDER — MEPERIDINE HCL 25 MG/ML IJ SOLN: 6.2500 mg | INTRAMUSCULAR | Status: DC | PRN

## 2019-01-11 MED ORDER — DEXAMETHASONE SODIUM PHOSPHATE 10 MG/ML IJ SOLN
INTRAMUSCULAR | Status: AC
  Filled 2019-01-11: qty 1

## 2019-01-11 MED ORDER — SUCCINYLCHOLINE CHLORIDE 200 MG/10ML IV SOSY
PREFILLED_SYRINGE | INTRAVENOUS | Status: AC
  Filled 2019-01-11: qty 10

## 2019-01-11 MED ORDER — OXYCODONE HCL 5 MG PO TABS
ORAL_TABLET | ORAL | Status: AC
  Filled 2019-01-11: qty 1

## 2019-01-11 MED ORDER — ARTIFICIAL TEARS OPHTHALMIC OINT
TOPICAL_OINTMENT | OPHTHALMIC | Status: DC | PRN
  Administered 2019-01-11: 1 via OPHTHALMIC

## 2019-01-11 MED ORDER — LIDOCAINE HCL (PF) 1 % IJ SOLN
INTRAMUSCULAR | Status: AC
  Filled 2019-01-11: qty 30

## 2019-01-11 MED ORDER — OXYCODONE HCL 5 MG PO TABS: 5.0000 mg | ORAL_TABLET | Freq: Four times a day (QID) | ORAL | 0 refills | Status: DC | PRN

## 2019-01-11 MED ORDER — PROMETHAZINE HCL 25 MG/ML IJ SOLN
INTRAMUSCULAR | Status: AC
  Filled 2019-01-11: qty 1

## 2019-01-11 MED ORDER — SUGAMMADEX SODIUM 500 MG/5ML IV SOLN
INTRAVENOUS | Status: DC | PRN
  Administered 2019-01-11: 300 mg via INTRAVENOUS

## 2019-01-11 MED ORDER — 0.9 % SODIUM CHLORIDE (POUR BTL) OPTIME
TOPICAL | Status: DC | PRN
  Administered 2019-01-11: 1000 mL

## 2019-01-11 MED ORDER — METHYLENE BLUE 0.5 % INJ SOLN
INTRAVENOUS | Status: AC
  Filled 2019-01-11: qty 10

## 2019-01-11 MED ORDER — SODIUM CHLORIDE 0.9 % IV SOLN
INTRAVENOUS | Status: DC | PRN
  Administered 2019-01-11: 40 ug/min via INTRAVENOUS

## 2019-01-11 MED ORDER — OXYCODONE HCL 5 MG/5ML PO SOLN: 5.0000 mg | Freq: Once | ORAL | Status: AC | PRN

## 2019-01-11 MED ORDER — OXYCODONE HCL 5 MG PO TABS
5.0000 mg | ORAL_TABLET | Freq: Once | ORAL | Status: AC | PRN
  Administered 2019-01-11: 5 mg via ORAL

## 2019-01-11 MED ORDER — LACTATED RINGERS IV SOLN
INTRAVENOUS | Status: DC | PRN
  Administered 2019-01-11: 07:00:00 via INTRAVENOUS

## 2019-01-11 MED ORDER — HYDROMORPHONE HCL 1 MG/ML IJ SOLN
0.2500 mg | INTRAMUSCULAR | Status: DC | PRN
  Administered 2019-01-11 (×2): 0.5 mg via INTRAVENOUS

## 2019-01-11 MED ORDER — MIDAZOLAM HCL 5 MG/5ML IJ SOLN
INTRAMUSCULAR | Status: DC | PRN
  Administered 2019-01-11: 2 mg via INTRAVENOUS

## 2019-01-11 MED ORDER — ONDANSETRON HCL 4 MG/2ML IJ SOLN
INTRAMUSCULAR | Status: DC | PRN
  Administered 2019-01-11: 4 mg via INTRAVENOUS

## 2019-01-11 MED ORDER — ROCURONIUM BROMIDE 50 MG/5ML IV SOSY
PREFILLED_SYRINGE | INTRAVENOUS | Status: DC | PRN
  Administered 2019-01-11: 40 mg via INTRAVENOUS

## 2019-01-11 SURGICAL SUPPLY — 47 items
ADH SKN CLS APL DERMABOND .7 (GAUZE/BANDAGES/DRESSINGS) ×1
APL SKNCLS STERI-STRIP NONHPOA (GAUZE/BANDAGES/DRESSINGS) ×1
BENZOIN TINCTURE PRP APPL 2/3 (GAUZE/BANDAGES/DRESSINGS) ×2 IMPLANT
BINDER BREAST 3XL (GAUZE/BANDAGES/DRESSINGS) ×1 IMPLANT
BINDER BREAST LRG (GAUZE/BANDAGES/DRESSINGS) IMPLANT
BINDER BREAST XLRG (GAUZE/BANDAGES/DRESSINGS) IMPLANT
BNDG ELASTIC 6X5.8 VLCR STR LF (GAUZE/BANDAGES/DRESSINGS) IMPLANT
CANISTER SUCT 3000ML PPV (MISCELLANEOUS) IMPLANT
CONT SPEC 4OZ CLIKSEAL STRL BL (MISCELLANEOUS) IMPLANT
COVER PROBE W GEL 5X96 (DRAPES) IMPLANT
COVER SURGICAL LIGHT HANDLE (MISCELLANEOUS) ×2 IMPLANT
COVER WAND RF STERILE (DRAPES) ×2 IMPLANT
DECANTER SPIKE VIAL GLASS SM (MISCELLANEOUS) IMPLANT
DERMABOND ADVANCED (GAUZE/BANDAGES/DRESSINGS) ×1
DERMABOND ADVANCED .7 DNX12 (GAUZE/BANDAGES/DRESSINGS) IMPLANT
DEVICE DUBIN SPECIMEN MAMMOGRA (MISCELLANEOUS) IMPLANT
DRAPE LAPAROTOMY 100X72 PEDS (DRAPES) ×2 IMPLANT
DRSG PAD ABDOMINAL 8X10 ST (GAUZE/BANDAGES/DRESSINGS) IMPLANT
ELECT REM PT RETURN 9FT ADLT (ELECTROSURGICAL) ×2
ELECTRODE REM PT RTRN 9FT ADLT (ELECTROSURGICAL) ×1 IMPLANT
GAUZE 4X4 16PLY RFD (DISPOSABLE) ×2 IMPLANT
GAUZE SPONGE 4X4 12PLY STRL (GAUZE/BANDAGES/DRESSINGS) ×2 IMPLANT
GLOVE BIO SURGEON STRL SZ8 (GLOVE) ×2 IMPLANT
GOWN STRL REUS W/ TWL LRG LVL3 (GOWN DISPOSABLE) ×1 IMPLANT
GOWN STRL REUS W/TWL 2XL LVL3 (GOWN DISPOSABLE) ×2 IMPLANT
GOWN STRL REUS W/TWL LRG LVL3 (GOWN DISPOSABLE) ×2
KIT BASIN OR (CUSTOM PROCEDURE TRAY) ×2 IMPLANT
KIT TURNOVER KIT B (KITS) ×2 IMPLANT
NDL 18GX1X1/2 (RX/OR ONLY) (NEEDLE) IMPLANT
NDL FILTER BLUNT 18X1 1/2 (NEEDLE) IMPLANT
NDL HYPO 25GX1X1/2 BEV (NEEDLE) IMPLANT
NEEDLE 18GX1X1/2 (RX/OR ONLY) (NEEDLE) IMPLANT
NEEDLE FILTER BLUNT 18X 1/2SAF (NEEDLE)
NEEDLE FILTER BLUNT 18X1 1/2 (NEEDLE) IMPLANT
NEEDLE HYPO 25GX1X1/2 BEV (NEEDLE) IMPLANT
NS IRRIG 1000ML POUR BTL (IV SOLUTION) ×2 IMPLANT
PACK GENERAL/GYN (CUSTOM PROCEDURE TRAY) ×2 IMPLANT
PAD ABD 8X10 STRL (GAUZE/BANDAGES/DRESSINGS) ×1 IMPLANT
PAD ARMBOARD 7.5X6 YLW CONV (MISCELLANEOUS) ×4 IMPLANT
PENCIL SMOKE EVACUATOR (MISCELLANEOUS) ×2 IMPLANT
STRIP CLOSURE SKIN 1/2X4 (GAUZE/BANDAGES/DRESSINGS) ×2 IMPLANT
SUT SILK 2 0 PERMA HAND 18 BK (SUTURE) IMPLANT
SUT VIC AB 4-0 SH 18 (SUTURE) ×2 IMPLANT
SYR CONTROL 10ML LL (SYRINGE) IMPLANT
TAPE CLOTH SURG 6X10 WHT LF (GAUZE/BANDAGES/DRESSINGS) ×1 IMPLANT
TOWEL GREEN STERILE (TOWEL DISPOSABLE) ×2 IMPLANT
TOWEL GREEN STERILE FF (TOWEL DISPOSABLE) ×2 IMPLANT

## 2019-01-11 NOTE — Interval H&P Note (Signed)
History and Physical Interval Note:  01/11/2019 7:21 AM  Maria [name]  has presented today for surgery, with the diagnosis of PAPILLOMA.  The various methods of treatment have been discussed with the patient and family. After consideration of risks, benefits and other options for treatment, the patient has consented to  Procedure(s): RADIOACTIVE SEED GUIDED OF 2 SITES EXCISIONAL LEFT BREAST BIOPSY (Left) as a surgical intervention.  The patient's history has been reviewed, patient examined, no change in status, stable for surgery.  I have reviewed the patient's chart and labs.  Questions were answered to the patient's satisfaction.     [name]

## 2019-01-11 NOTE — Op Note (Signed)
Maria [name]  December 02, 1960 11 January 2019    PCP:  Simona Huh, NP   Surgeon: Kaylyn Lim, MD, FACS  Asst:  none  Anes:  general  Preop Dx: Two suspicious areas in the left breast previously biopsied-not malignant Postop Dx: Same path pending  Procedure: Two separate lesions in the left breast removed through two separate incisions Location Surgery: OR 8 at [facility] Complications: none  EBL:   20 cc  Drains: none  Description of Procedure:  The patient was taken to OR 8 .  After anesthesia was administered and the patient was prepped  with Chloroprep and a timeout was performed.  The breast had been mapped and there were two areas that were closest to the radioactive seeds.  There was the first biopsy area along the lateral areolar margin on the left and the second was at the 12 oclock position several cm from the areola.    The periareolar incision was performed first and carried down to the hot zone with Bovie.  The lesion was surrounded and removed en toto and marked with color dyes and submitted for xray confirmation that showed both the clip and radioactive seed.  Hemostasis was present.    The second biopsy was up near a skin tatoo on the left breast at the 12 oclock position several cm from the areola.  This was carried down and the marked was removed and the xray showed the radioactive clip.  The xray showed multiple microcalcifications.  Two more inferior biopsies were performed and the previous clip was retrieved.  Hemostasis was present.    The sites were irrigated and lidocaine instilled.  The incisions were closed with 4-0 vicryl and 4-0 monocryl and Dermabond.  Sterile dressing was applied.   The patient tolerated the procedure well and was taken to the PACU in stable condition.     Matt B. Hassell Done, [place], [facility] Surgery, [place]

## 2019-01-11 NOTE — Anesthesia Procedure Notes (Signed)
Procedure Name: Intubation Date/Time: 01/11/2019 7:38 AM Performed by: Myna Bright, CRNA Pre-anesthesia Checklist: Patient identified, Emergency Drugs available, Suction available and Patient being monitored Patient Re-evaluated:Patient Re-evaluated prior to induction Oxygen Delivery Method: Circle system utilized Preoxygenation: Pre-oxygenation with 100% oxygen Induction Type: IV induction Ventilation: Mask ventilation without difficulty Laryngoscope Size: Mac and 4 Grade View: Grade II Tube type: Oral Tube size: 7.0 mm Number of attempts: 1 Airway Equipment and Method: Stylet Placement Confirmation: ETT inserted through vocal cords under direct vision,  positive ETCO2 and breath sounds checked- equal and bilateral Secured at: 22 cm Tube secured with: Tape Dental Injury: Teeth and Oropharynx as per pre-operative assessment

## 2019-01-11 NOTE — Discharge Instructions (Signed)
Breast Biopsy, Care After This sheet gives you information about how to care for yourself after your procedure. Your health care provider may also give you more specific instructions. If you have problems or questions, contact your health care provider. What can I expect after the procedure? After your procedure, it is common to have:  Bruising on your breast.  Numbness, tingling, or pain near your biopsy site. Follow these instructions at home: Medicines  Take over-the-counter and prescription medicines only as told by your health care provider.  Do not drive for 24 hours if you were given a sedative during your procedure.  Do not drink alcohol while taking pain medicine.  Do not drive or use heavy machinery while taking prescription pain medicine. Biopsy site care      Follow instructions from your health care provider about how to take care of your incision or puncture site. Make sure you: ? Wash your hands with soap and water before you change your bandage (dressing). If soap and water are not available, use hand sanitizer. ? Change your dressing as told by your health care provider. ? Leave any stitches (sutures), skin glue, or adhesive strips in place. These skin closures may need to stay in place for 2 weeks or longer. If adhesive strip edges start to loosen and curl up, you may trim the loose edges. Do not remove adhesive strips completely unless your health care provider tells you to do that.  If you have sutures, keep them dry when bathing.  Check your incision or puncture area every day for signs of infection. Check for: ? Redness, swelling, or pain. ? Fluid or blood. ? Warmth. ? Pus or a bad smell.  Protect the biopsy area. Do not let the area get bumped. Activity  If you had an incision during your procedure, avoid activities that may pull the incision site open. Avoid stretching, reaching above your head, exercise, sports, or lifting anything that is heavier than  3 lb (1.4 kg).  Return to your normal activities as told by your health care provider. Ask your health care provider what activities are safe for you. Managing pain  If directed, put ice on the biopsy site to relieve tenderness: ? Put ice in a plastic bag. ? Place a towel between your skin and the bag. ? Leave the ice on for 20 minutes, 2-3 times a day. General instructions  Resume your usual diet.  Wear a good support bra for as long as told by your health care provider.  Get checked for extra fluid around your lymph nodes (lymphedema) as often as told by your health care provider.  Keep all follow-up visits as told by your health care provider. This is important. Contact a health care provider if:  You have more redness, swelling, or pain at the biopsy site.  You have more fluid or blood coming from your biopsy site.  Your biopsy site feels warm to the touch.  You have pus or a bad smell coming from the biopsy site.  Your biopsy site breaks open after the sutures or adhesive strips have been removed.  You have a rash.  You have a fever. Get help right away if you have:  Increased bleeding (more than a small spot) from the biopsy site.  Difficulty breathing.  Red streaks around the biopsy site. Summary  After your procedure, it is common to have bruising, numbness, tingling, or pain near the biopsy site.  Do not drive or use heavy machinery  while taking prescription pain medicine.  Wear a good support bra for as long as told by your health care provider.  If you had an incision during your procedure, avoid activities that may pull the incision site open. Ask your health care provider what activities are safe for you. This information is not intended to replace advice given to you by your health care provider. Make sure you discuss any questions you have with your health care provider. Document Released: 10/15/2004 Document Revised: 09/13/2017 Document Reviewed:  09/13/2017 Elsevier Patient Education  2020 [employer].

## 2019-01-11 NOTE — Transfer of Care (Signed)
Immediate Anesthesia Transfer of Care Note  Patient: Maria [name]  Procedure(s) Performed: RADIOACTIVE SEED GUIDED OF 2 SITES EXCISIONAL LEFT BREAST BIOPSY (Left Breast)  Patient Location: PACU  Anesthesia Type:General  Level of Consciousness: awake, alert , oriented and patient cooperative  Airway & Oxygen Therapy: Patient Spontanous Breathing and Patient connected to nasal cannula oxygen  Post-op Assessment: Report given to RN, Post -op Vital signs reviewed and stable and Patient moving all extremities  Post vital signs: Reviewed and stable  Last Vitals:  Vitals Value Taken Time  BP 136/84 01/11/19 0929  Temp    Pulse 75 01/11/19 0930  Resp 17 01/11/19 0930  SpO2 100 % 01/11/19 0930  Vitals shown include unvalidated device data.  Last Pain:  Vitals:   01/11/19 0631  TempSrc: Oral  PainSc:          Complications: No apparent anesthesia complications

## 2019-01-11 NOTE — Anesthesia Postprocedure Evaluation (Signed)
Anesthesia Post Note  Patient: Maria [name]  Procedure(s) Performed: RADIOACTIVE SEED GUIDED OF 2 SITES EXCISIONAL LEFT BREAST BIOPSY (Left Breast)     Patient location during evaluation: PACU Anesthesia Type: General Level of consciousness: sedated and patient cooperative Pain management: pain level controlled Vital Signs Assessment: post-procedure vital signs reviewed and stable Respiratory status: spontaneous breathing Cardiovascular status: stable Anesthetic complications: no    Last Vitals:  Vitals:   01/11/19 0945 01/11/19 1000  BP: 140/85 (!) 145/93  Pulse: 70 74  Resp: 12 14  Temp:    SpO2: 98% 97%    Last Pain:  Vitals:   01/11/19 1000  TempSrc:   PainSc: [place]

## 2019-01-12 ENCOUNTER — Encounter (HOSPITAL_COMMUNITY): Payer: Self-pay | Admitting: Surgery

## 2019-01-12 ENCOUNTER — Other Ambulatory Visit: Payer: Self-pay | Admitting: Family Medicine

## 2019-01-12 DIAGNOSIS — M109 Gout, unspecified: Secondary | ICD-10-CM

## 2019-01-14 ENCOUNTER — Telehealth: Payer: Self-pay

## 2019-01-15 LAB — SURGICAL PATHOLOGY

## 2019-01-15 MED ORDER — NITROGLYCERIN 0.2 MG/HR TD PT24: MEDICATED_PATCH | TRANSDERMAL | 1 refills | Status: DC

## 2019-01-15 NOTE — Telephone Encounter (Signed)
Refilled NTG 

## 2019-01-16 DIAGNOSIS — Z1159 Encounter for screening for other viral diseases: Secondary | ICD-10-CM | POA: Diagnosis not present

## 2019-01-16 DIAGNOSIS — R1310 Dysphagia, unspecified: Secondary | ICD-10-CM | POA: Diagnosis not present

## 2019-01-16 DIAGNOSIS — Z1211 Encounter for screening for malignant neoplasm of colon: Secondary | ICD-10-CM | POA: Diagnosis not present

## 2019-01-23 DIAGNOSIS — Z01818 Encounter for other preprocedural examination: Secondary | ICD-10-CM | POA: Diagnosis not present

## 2019-01-23 DIAGNOSIS — R1310 Dysphagia, unspecified: Secondary | ICD-10-CM | POA: Diagnosis not present

## 2019-01-23 DIAGNOSIS — E119 Type 2 diabetes mellitus without complications: Secondary | ICD-10-CM | POA: Diagnosis not present

## 2019-01-29 DIAGNOSIS — K900 Celiac disease: Secondary | ICD-10-CM | POA: Diagnosis not present

## 2019-01-29 DIAGNOSIS — A048 Other specified bacterial intestinal infections: Secondary | ICD-10-CM | POA: Diagnosis not present

## 2019-01-29 DIAGNOSIS — K2970 Gastritis, unspecified, without bleeding: Secondary | ICD-10-CM | POA: Diagnosis not present

## 2019-01-29 DIAGNOSIS — K5281 Eosinophilic gastritis or gastroenteritis: Secondary | ICD-10-CM | POA: Diagnosis not present

## 2019-01-30 DIAGNOSIS — M5126 Other intervertebral disc displacement, lumbar region: Secondary | ICD-10-CM | POA: Diagnosis not present

## 2019-01-30 DIAGNOSIS — Z79899 Other long term (current) drug therapy: Secondary | ICD-10-CM | POA: Diagnosis not present

## 2019-01-30 DIAGNOSIS — G8929 Other chronic pain: Secondary | ICD-10-CM | POA: Diagnosis not present

## 2019-01-30 DIAGNOSIS — K2270 Barrett's esophagus without dysplasia: Secondary | ICD-10-CM | POA: Diagnosis not present

## 2019-01-30 DIAGNOSIS — K200 Eosinophilic esophagitis: Secondary | ICD-10-CM | POA: Diagnosis not present

## 2019-01-30 DIAGNOSIS — M5030 Other cervical disc degeneration, unspecified cervical region: Secondary | ICD-10-CM | POA: Diagnosis not present

## 2019-01-30 DIAGNOSIS — J069 Acute upper respiratory infection, unspecified: Secondary | ICD-10-CM | POA: Diagnosis not present

## 2019-01-30 DIAGNOSIS — M545 Low back pain: Secondary | ICD-10-CM | POA: Diagnosis not present

## 2019-02-11 ENCOUNTER — Telehealth: Payer: Self-pay

## 2019-02-11 DIAGNOSIS — M109 Gout, unspecified: Secondary | ICD-10-CM

## 2019-02-12 DIAGNOSIS — R1310 Dysphagia, unspecified: Secondary | ICD-10-CM | POA: Diagnosis not present

## 2019-02-12 DIAGNOSIS — Z9189 Other specified personal risk factors, not elsewhere classified: Secondary | ICD-10-CM | POA: Diagnosis not present

## 2019-02-12 DIAGNOSIS — Z1211 Encounter for screening for malignant neoplasm of colon: Secondary | ICD-10-CM | POA: Diagnosis not present

## 2019-02-12 MED ORDER — COLCHICINE 0.6 MG PO CAPS: ORAL_CAPSULE | ORAL | 0 refills | Status: DC

## 2019-02-12 NOTE — Telephone Encounter (Signed)
refill 

## 2019-02-22 ENCOUNTER — Telehealth: Payer: Self-pay

## 2019-02-22 ENCOUNTER — Ambulatory Visit: Payer: PPO | Admitting: Family Medicine

## 2019-02-22 NOTE — Telephone Encounter (Signed)
Per Dr. Nori Riis she will need to immerse her foot in cold ice water for 15 minutes 3 times a day. She will call back Monday to schedule an appt to see Dr. Nori Riis next week.

## 2019-02-26 DIAGNOSIS — R110 Nausea: Secondary | ICD-10-CM | POA: Diagnosis not present

## 2019-02-26 DIAGNOSIS — M47816 Spondylosis without myelopathy or radiculopathy, lumbar region: Secondary | ICD-10-CM | POA: Diagnosis not present

## 2019-02-26 DIAGNOSIS — R109 Unspecified abdominal pain: Secondary | ICD-10-CM | POA: Diagnosis not present

## 2019-02-26 DIAGNOSIS — M5030 Other cervical disc degeneration, unspecified cervical region: Secondary | ICD-10-CM | POA: Diagnosis not present

## 2019-02-26 DIAGNOSIS — Z03818 Encounter for observation for suspected exposure to other biological agents ruled out: Secondary | ICD-10-CM | POA: Diagnosis not present

## 2019-02-26 DIAGNOSIS — M5126 Other intervertebral disc displacement, lumbar region: Secondary | ICD-10-CM | POA: Diagnosis not present

## 2019-02-26 DIAGNOSIS — E119 Type 2 diabetes mellitus without complications: Secondary | ICD-10-CM | POA: Diagnosis not present

## 2019-02-26 DIAGNOSIS — Z79899 Other long term (current) drug therapy: Secondary | ICD-10-CM | POA: Diagnosis not present

## 2019-02-26 DIAGNOSIS — Z7251 High risk heterosexual behavior: Secondary | ICD-10-CM | POA: Diagnosis not present

## 2019-02-26 DIAGNOSIS — Z114 Encounter for screening for human immunodeficiency virus [HIV]: Secondary | ICD-10-CM | POA: Diagnosis not present

## 2019-02-28 ENCOUNTER — Ambulatory Visit (INDEPENDENT_AMBULATORY_CARE_PROVIDER_SITE_OTHER): Payer: PPO | Admitting: Sports Medicine

## 2019-02-28 ENCOUNTER — Other Ambulatory Visit: Payer: Self-pay

## 2019-02-28 VITALS — BP 118/79 | Ht 68.0 in | Wt 313.0 lb

## 2019-02-28 DIAGNOSIS — M7661 Achilles tendinitis, right leg: Secondary | ICD-10-CM | POA: Diagnosis not present

## 2019-02-28 NOTE — Progress Notes (Signed)
  Maria [name] - 58 y.o. female MRN [id number]  Date of birth: [birth year]  SUBJECTIVE:   CC: chronic achilles pain   Pain has been present for over a  Year. She was last seen in our office for plantar fascitis in July 2020. At that time, she was started on nitroglycerin protocol, given heel lifts and eccentric exercises. She reports that pain got a little better for a few months but never resolved. For the past 3 months, the pain has been worse. She has been using nitroglycerin patches daily with no improvement. The back of her heel is very sensitive to the point where she cannot wear enclosed shoes because it hurts. She has been unable to do exercises because its painful. Cannot walk due to pain. Has not been icing heels because she is cold natured. She uses oxycodone for chronic low back pain and it has not helped her achilles pain.   She is frustrated because she has lost significant weight (100 lbs) but is having difficulty with further weight loss since pain is interfering with activity.  ROS: No unexpected weight loss, fever, chills, swelling, instability, muscle pain, numbness/tingling, redness, otherwise see HPI   PMHx - Updated and reviewed.  She has lost 100 lbs. hgbA1c is now 6.7 PSHx - Updated and reviewed.  Contributory factors include:  Negative FHx - Updated and reviewed.  Contributory factors include:  Negative Social Hx - Updated and reviewed. Contributory factors include: Negative Medications - reviewed   DATA REVIEWED: Prior records  PHYSICAL EXAM:  VS: BP:118/79  HR: bpm  TEMP: ( )  RESP:   HT:5\' 8"  (172.7 cm)   WT:(!) 313 lb (142 kg)  BMI:47.6 PHYSICAL EXAM: Gen: NAD, alert, cooperative with exam, well-appearing HEENT: clear conjunctiva,  CV:  no edema, capillary refill brisk, normal rate Resp: non-labored Skin: no rashes, normal turgor  Neuro: no gross deficits.  Psych:  alert and oriented  Right Ankle: - Inspection: Swelling noted at base of achilles -  Palpation: TTP at insertion site of the achilles at the calcneus - Strength: Normal strength with dorsiflexion, plantarflexion, inversion, and eversion of foot; flexion and extension of toes - ROM: Limited active dorsiflexion due to pain. Can passively dorsiflex to 80 degrees. - Neuro/vasc: NV intact - Special Tests: negative thompson test  Left ankle: No abnormalities No TTP Strength in all fields Full ROM NVI  [place] right achilles tendon with thickening of tendon to 0.64 cm in longitudinal view and 0.76 in transverse view. Increased doppler signal at insertion with several osteophytes and calcifications noted. No fluid noted in retrocalcaneal bursa.  Impression: chronic calcific insertional achilles tendinopathy     ASSESSMENT & PLAN:  Right chronic calcific insertional achilles tendinopathy with symptoms > 1 year and thickening, calcifications. Given no improvement with nitroglycerin, heel inserts, will refer to PT for iontophoresis and other rehab. Return in 1 month. If no improvement, will discuss surgical interventions if she desires.  Patient seen and evaluated with the sports medicine fellow.  I agree with the above plan of care.  We will refer the patient to physical therapy and she will follow-up with [place] in 4 to 6 weeks.  If no improvement, consider surgical referral given chronicity of symptoms.

## 2019-03-01 ENCOUNTER — Encounter: Payer: Self-pay | Admitting: Sports Medicine

## 2019-03-25 DIAGNOSIS — M5126 Other intervertebral disc displacement, lumbar region: Secondary | ICD-10-CM | POA: Diagnosis not present

## 2019-03-25 DIAGNOSIS — E119 Type 2 diabetes mellitus without complications: Secondary | ICD-10-CM | POA: Diagnosis not present

## 2019-03-25 DIAGNOSIS — M5030 Other cervical disc degeneration, unspecified cervical region: Secondary | ICD-10-CM | POA: Diagnosis not present

## 2019-03-25 DIAGNOSIS — M545 Low back pain: Secondary | ICD-10-CM | POA: Diagnosis not present

## 2019-03-25 DIAGNOSIS — Z01818 Encounter for other preprocedural examination: Secondary | ICD-10-CM | POA: Diagnosis not present

## 2019-03-25 DIAGNOSIS — Z79899 Other long term (current) drug therapy: Secondary | ICD-10-CM | POA: Diagnosis not present

## 2019-03-25 DIAGNOSIS — G8929 Other chronic pain: Secondary | ICD-10-CM | POA: Diagnosis not present

## 2019-04-01 ENCOUNTER — Telehealth: Payer: Self-pay | Admitting: Plastic Surgery

## 2019-04-01 NOTE — Telephone Encounter (Signed)

## 2019-04-02 ENCOUNTER — Institutional Professional Consult (permissible substitution): Payer: PPO | Admitting: Plastic Surgery

## 2019-04-19 DIAGNOSIS — G8929 Other chronic pain: Secondary | ICD-10-CM | POA: Diagnosis not present

## 2019-04-19 DIAGNOSIS — R109 Unspecified abdominal pain: Secondary | ICD-10-CM | POA: Diagnosis not present

## 2019-04-19 DIAGNOSIS — M545 Low back pain: Secondary | ICD-10-CM | POA: Diagnosis not present

## 2019-04-19 DIAGNOSIS — R110 Nausea: Secondary | ICD-10-CM | POA: Diagnosis not present

## 2019-04-19 DIAGNOSIS — R8290 Unspecified abnormal findings in urine: Secondary | ICD-10-CM | POA: Diagnosis not present

## 2019-04-19 DIAGNOSIS — E119 Type 2 diabetes mellitus without complications: Secondary | ICD-10-CM | POA: Diagnosis not present

## 2019-04-19 DIAGNOSIS — M5030 Other cervical disc degeneration, unspecified cervical region: Secondary | ICD-10-CM | POA: Diagnosis not present

## 2019-04-19 DIAGNOSIS — Z79899 Other long term (current) drug therapy: Secondary | ICD-10-CM | POA: Diagnosis not present

## 2019-04-19 DIAGNOSIS — M5126 Other intervertebral disc displacement, lumbar region: Secondary | ICD-10-CM | POA: Diagnosis not present

## 2019-04-25 DIAGNOSIS — R112 Nausea with vomiting, unspecified: Secondary | ICD-10-CM | POA: Diagnosis not present

## 2019-04-25 DIAGNOSIS — R319 Hematuria, unspecified: Secondary | ICD-10-CM | POA: Diagnosis not present

## 2019-04-25 DIAGNOSIS — Z9049 Acquired absence of other specified parts of digestive tract: Secondary | ICD-10-CM | POA: Diagnosis not present

## 2019-04-25 DIAGNOSIS — E119 Type 2 diabetes mellitus without complications: Secondary | ICD-10-CM | POA: Diagnosis not present

## 2019-04-25 DIAGNOSIS — I10 Essential (primary) hypertension: Secondary | ICD-10-CM | POA: Diagnosis not present

## 2019-04-25 DIAGNOSIS — R109 Unspecified abdominal pain: Secondary | ICD-10-CM | POA: Diagnosis not present

## 2019-04-25 DIAGNOSIS — N3001 Acute cystitis with hematuria: Secondary | ICD-10-CM | POA: Diagnosis not present

## 2019-04-25 DIAGNOSIS — N3000 Acute cystitis without hematuria: Secondary | ICD-10-CM | POA: Diagnosis not present

## 2019-04-30 ENCOUNTER — Telehealth: Payer: Self-pay

## 2019-04-30 DIAGNOSIS — M109 Gout, unspecified: Secondary | ICD-10-CM

## 2019-04-30 MED ORDER — COLCHICINE 0.6 MG PO CAPS: ORAL_CAPSULE | ORAL | 1 refills | Status: DC

## 2019-04-30 NOTE — Telephone Encounter (Signed)
Please let her know I have refilled it THANKS! Dorcas Mcmurray

## 2019-05-20 DIAGNOSIS — M5126 Other intervertebral disc displacement, lumbar region: Secondary | ICD-10-CM | POA: Diagnosis not present

## 2019-05-20 DIAGNOSIS — Z79899 Other long term (current) drug therapy: Secondary | ICD-10-CM | POA: Diagnosis not present

## 2019-05-20 DIAGNOSIS — J329 Chronic sinusitis, unspecified: Secondary | ICD-10-CM | POA: Diagnosis not present

## 2019-05-20 DIAGNOSIS — B9689 Other specified bacterial agents as the cause of diseases classified elsewhere: Secondary | ICD-10-CM | POA: Diagnosis not present

## 2019-05-20 DIAGNOSIS — M545 Low back pain: Secondary | ICD-10-CM | POA: Diagnosis not present

## 2019-05-20 DIAGNOSIS — M5030 Other cervical disc degeneration, unspecified cervical region: Secondary | ICD-10-CM | POA: Diagnosis not present

## 2019-05-20 DIAGNOSIS — G8929 Other chronic pain: Secondary | ICD-10-CM | POA: Diagnosis not present

## 2019-06-04 DIAGNOSIS — R05 Cough: Secondary | ICD-10-CM | POA: Diagnosis not present

## 2019-06-10 DIAGNOSIS — M5126 Other intervertebral disc displacement, lumbar region: Secondary | ICD-10-CM | POA: Diagnosis not present

## 2019-06-10 DIAGNOSIS — G8929 Other chronic pain: Secondary | ICD-10-CM | POA: Diagnosis not present

## 2019-06-10 DIAGNOSIS — M5030 Other cervical disc degeneration, unspecified cervical region: Secondary | ICD-10-CM | POA: Diagnosis not present

## 2019-06-10 DIAGNOSIS — M545 Low back pain: Secondary | ICD-10-CM | POA: Diagnosis not present

## 2019-06-10 DIAGNOSIS — Z79899 Other long term (current) drug therapy: Secondary | ICD-10-CM | POA: Diagnosis not present

## 2019-06-10 DIAGNOSIS — E119 Type 2 diabetes mellitus without complications: Secondary | ICD-10-CM | POA: Diagnosis not present

## 2019-06-13 DIAGNOSIS — E1165 Type 2 diabetes mellitus with hyperglycemia: Secondary | ICD-10-CM | POA: Diagnosis not present

## 2019-06-13 DIAGNOSIS — R0602 Shortness of breath: Secondary | ICD-10-CM | POA: Diagnosis not present

## 2019-06-17 DIAGNOSIS — Z03818 Encounter for observation for suspected exposure to other biological agents ruled out: Secondary | ICD-10-CM | POA: Diagnosis not present

## 2019-06-17 DIAGNOSIS — Z20822 Contact with and (suspected) exposure to covid-19: Secondary | ICD-10-CM | POA: Diagnosis not present

## 2019-07-11 DIAGNOSIS — M5126 Other intervertebral disc displacement, lumbar region: Secondary | ICD-10-CM | POA: Diagnosis not present

## 2019-07-11 DIAGNOSIS — M5030 Other cervical disc degeneration, unspecified cervical region: Secondary | ICD-10-CM | POA: Diagnosis not present

## 2019-07-11 DIAGNOSIS — Z79899 Other long term (current) drug therapy: Secondary | ICD-10-CM | POA: Diagnosis not present

## 2019-07-11 DIAGNOSIS — E119 Type 2 diabetes mellitus without complications: Secondary | ICD-10-CM | POA: Diagnosis not present

## 2019-07-11 DIAGNOSIS — G8929 Other chronic pain: Secondary | ICD-10-CM | POA: Diagnosis not present

## 2019-07-11 DIAGNOSIS — M545 Low back pain: Secondary | ICD-10-CM | POA: Diagnosis not present

## 2019-08-06 DIAGNOSIS — D539 Nutritional anemia, unspecified: Secondary | ICD-10-CM | POA: Diagnosis not present

## 2019-08-06 DIAGNOSIS — Z1331 Encounter for screening for depression: Secondary | ICD-10-CM | POA: Diagnosis not present

## 2019-08-06 DIAGNOSIS — K219 Gastro-esophageal reflux disease without esophagitis: Secondary | ICD-10-CM | POA: Diagnosis not present

## 2019-08-06 DIAGNOSIS — R5383 Other fatigue: Secondary | ICD-10-CM | POA: Diagnosis not present

## 2019-08-06 DIAGNOSIS — Z1159 Encounter for screening for other viral diseases: Secondary | ICD-10-CM | POA: Diagnosis not present

## 2019-08-06 DIAGNOSIS — Z7251 High risk heterosexual behavior: Secondary | ICD-10-CM | POA: Diagnosis not present

## 2019-08-06 DIAGNOSIS — R600 Localized edema: Secondary | ICD-10-CM | POA: Diagnosis not present

## 2019-08-06 DIAGNOSIS — E559 Vitamin D deficiency, unspecified: Secondary | ICD-10-CM | POA: Diagnosis not present

## 2019-08-06 DIAGNOSIS — M129 Arthropathy, unspecified: Secondary | ICD-10-CM | POA: Diagnosis not present

## 2019-08-06 DIAGNOSIS — J309 Allergic rhinitis, unspecified: Secondary | ICD-10-CM | POA: Diagnosis not present

## 2019-08-06 DIAGNOSIS — E119 Type 2 diabetes mellitus without complications: Secondary | ICD-10-CM | POA: Diagnosis not present

## 2019-08-06 DIAGNOSIS — R0602 Shortness of breath: Secondary | ICD-10-CM | POA: Diagnosis not present

## 2019-08-06 DIAGNOSIS — Z0000 Encounter for general adult medical examination without abnormal findings: Secondary | ICD-10-CM | POA: Diagnosis not present

## 2019-08-06 DIAGNOSIS — N951 Menopausal and female climacteric states: Secondary | ICD-10-CM | POA: Diagnosis not present

## 2019-08-06 DIAGNOSIS — Z1339 Encounter for screening examination for other mental health and behavioral disorders: Secondary | ICD-10-CM | POA: Diagnosis not present

## 2019-08-06 DIAGNOSIS — E7800 Pure hypercholesterolemia, unspecified: Secondary | ICD-10-CM | POA: Diagnosis not present

## 2019-08-06 DIAGNOSIS — Z114 Encounter for screening for human immunodeficiency virus [HIV]: Secondary | ICD-10-CM | POA: Diagnosis not present

## 2019-08-09 NOTE — Telephone Encounter (Signed)
error 

## 2019-08-12 DIAGNOSIS — E119 Type 2 diabetes mellitus without complications: Secondary | ICD-10-CM | POA: Diagnosis not present

## 2019-08-12 DIAGNOSIS — M5030 Other cervical disc degeneration, unspecified cervical region: Secondary | ICD-10-CM | POA: Diagnosis not present

## 2019-08-12 DIAGNOSIS — R768 Other specified abnormal immunological findings in serum: Secondary | ICD-10-CM | POA: Diagnosis not present

## 2019-08-12 DIAGNOSIS — Z79899 Other long term (current) drug therapy: Secondary | ICD-10-CM | POA: Diagnosis not present

## 2019-08-12 DIAGNOSIS — M545 Low back pain: Secondary | ICD-10-CM | POA: Diagnosis not present

## 2019-08-12 DIAGNOSIS — G8929 Other chronic pain: Secondary | ICD-10-CM | POA: Diagnosis not present

## 2019-08-21 DIAGNOSIS — R0602 Shortness of breath: Secondary | ICD-10-CM | POA: Diagnosis not present

## 2019-09-05 DIAGNOSIS — R600 Localized edema: Secondary | ICD-10-CM | POA: Diagnosis not present

## 2019-09-09 DIAGNOSIS — G8929 Other chronic pain: Secondary | ICD-10-CM | POA: Diagnosis not present

## 2019-09-09 DIAGNOSIS — M545 Low back pain: Secondary | ICD-10-CM | POA: Diagnosis not present

## 2019-09-09 DIAGNOSIS — E119 Type 2 diabetes mellitus without complications: Secondary | ICD-10-CM | POA: Diagnosis not present

## 2019-09-09 DIAGNOSIS — Z79899 Other long term (current) drug therapy: Secondary | ICD-10-CM | POA: Diagnosis not present

## 2019-09-09 DIAGNOSIS — R768 Other specified abnormal immunological findings in serum: Secondary | ICD-10-CM | POA: Diagnosis not present

## 2019-09-13 DIAGNOSIS — Z79899 Other long term (current) drug therapy: Secondary | ICD-10-CM | POA: Diagnosis not present

## 2019-09-13 DIAGNOSIS — M79672 Pain in left foot: Secondary | ICD-10-CM | POA: Diagnosis not present

## 2019-09-13 DIAGNOSIS — G8929 Other chronic pain: Secondary | ICD-10-CM | POA: Diagnosis not present

## 2019-09-13 DIAGNOSIS — E119 Type 2 diabetes mellitus without complications: Secondary | ICD-10-CM | POA: Diagnosis not present

## 2019-09-13 DIAGNOSIS — G894 Chronic pain syndrome: Secondary | ICD-10-CM | POA: Diagnosis not present

## 2019-09-13 DIAGNOSIS — M545 Low back pain: Secondary | ICD-10-CM | POA: Diagnosis not present

## 2019-09-14 DIAGNOSIS — M7989 Other specified soft tissue disorders: Secondary | ICD-10-CM | POA: Diagnosis not present

## 2019-09-14 DIAGNOSIS — S92515A Nondisplaced fracture of proximal phalanx of left lesser toe(s), initial encounter for closed fracture: Secondary | ICD-10-CM | POA: Diagnosis not present

## 2019-09-14 DIAGNOSIS — E119 Type 2 diabetes mellitus without complications: Secondary | ICD-10-CM | POA: Diagnosis not present

## 2019-09-14 DIAGNOSIS — M79675 Pain in left toe(s): Secondary | ICD-10-CM | POA: Diagnosis not present

## 2019-09-14 DIAGNOSIS — R202 Paresthesia of skin: Secondary | ICD-10-CM | POA: Diagnosis not present

## 2019-09-14 DIAGNOSIS — I10 Essential (primary) hypertension: Secondary | ICD-10-CM | POA: Diagnosis not present

## 2019-09-14 DIAGNOSIS — S92512A Displaced fracture of proximal phalanx of left lesser toe(s), initial encounter for closed fracture: Secondary | ICD-10-CM | POA: Diagnosis not present

## 2019-09-14 DIAGNOSIS — M7918 Myalgia, other site: Secondary | ICD-10-CM | POA: Diagnosis not present

## 2019-09-17 ENCOUNTER — Other Ambulatory Visit: Payer: Self-pay

## 2019-09-17 ENCOUNTER — Ambulatory Visit (INDEPENDENT_AMBULATORY_CARE_PROVIDER_SITE_OTHER): Payer: PPO | Admitting: Sports Medicine

## 2019-09-17 VITALS — BP 140/97 | Ht 68.0 in | Wt 312.0 lb

## 2019-09-17 DIAGNOSIS — S92502A Displaced unspecified fracture of left lesser toe(s), initial encounter for closed fracture: Secondary | ICD-10-CM | POA: Diagnosis not present

## 2019-09-17 NOTE — Patient Instructions (Signed)
  I am sorry that you broke your fourth and fifth toe on your left foot but they should heal without any difficulty.  Use the Ace wrap for compression during the day.  You can stop it when the swelling is gone.  Wear your postop shoe when you are ambulating.  I want you to start icing your fourth and fifth toes 3-4 times a day.  Use a Styrofoam cup with a frozen water in it.  You can use it as a ice massage.  I am going to make a follow-up appointment in 3 weeks but if you are improving then you can cancel that appointment.  Continue on your current pain medicines.

## 2019-09-18 ENCOUNTER — Other Ambulatory Visit: Payer: Self-pay | Admitting: Family Medicine

## 2019-09-18 DIAGNOSIS — M109 Gout, unspecified: Secondary | ICD-10-CM

## 2019-09-18 NOTE — Progress Notes (Signed)
   Subjective:    Patient ID: Maria [name], female    DOB: 13-May-1960, 59 y.o.   MRN: [id number]  HPI chief complaint: Left foot pain  Patient presents today after having fractured her fourth and fifth toes on her left foot on June 3.  She stumped her fourth and fifth toes on a concrete block.  She was seen in the emergency department 2 days later where x-rays of her foot showed a nondisplaced obliquely oriented fracture of the proximal phalanx of the fourth toe.  She also had a possible nondisplaced fracture of the proximal phalanx of the fifth toe.  No other fractures were seen.  She was given naproxen for pain relief.  She is also on chronic narcotic pain medications for unrelated issues.  She was placed into a postop shoe at that time.  Her pain is in the foot and fourth and fifth toes.  She has swelling of the foot.  She is tearful in the office.  Inner medical history reviewed Medications reviewed Allergies reviewed    Review of Systems    As above Objective:   Physical Exam  Well-developed, well-nourished.  No acute distress.  Awake alert and oriented x3.  Left foot: There is moderate soft tissue swelling throughout the left foot.  She is tender to palpation.  There is ecchymosis in the plantar aspect of the foot.  No deformity seen.  She is tender to palpation the fourth and fifth toes.  Good capillary refill.  X-rays of the left foot dated September 14, 2019 were personally reviewed.  Findings are as above.  I see no other fractures.      Assessment & Plan:  Left foot pain and swelling secondary to nondisplaced fractures of the fourth and fifth toes  Patient is reassured that these fractures are stable and should heal without complication.  She will continue on her current pain medications.  She will continue in a postop shoe when ambulating.  She has both crutches and a walker to help assist with ambulation.  I have encouraged elevation to help with swelling.  She will use an Ace  wrap for swelling as well.  In addition, I would like for her to ice her foot and fractured toes 3-4 times a day until pain starts to improve.  We will schedule a follow-up visit 3 weeks from now.

## 2019-10-07 DIAGNOSIS — M545 Low back pain: Secondary | ICD-10-CM | POA: Diagnosis not present

## 2019-10-07 DIAGNOSIS — E119 Type 2 diabetes mellitus without complications: Secondary | ICD-10-CM | POA: Diagnosis not present

## 2019-10-07 DIAGNOSIS — G8929 Other chronic pain: Secondary | ICD-10-CM | POA: Diagnosis not present

## 2019-10-07 DIAGNOSIS — M79672 Pain in left foot: Secondary | ICD-10-CM | POA: Diagnosis not present

## 2019-10-07 DIAGNOSIS — Z79899 Other long term (current) drug therapy: Secondary | ICD-10-CM | POA: Diagnosis not present

## 2019-10-15 ENCOUNTER — Ambulatory Visit: Payer: PPO | Admitting: Sports Medicine

## 2019-10-17 ENCOUNTER — Other Ambulatory Visit: Payer: Self-pay

## 2019-10-17 ENCOUNTER — Ambulatory Visit (INDEPENDENT_AMBULATORY_CARE_PROVIDER_SITE_OTHER): Payer: PPO | Admitting: Sports Medicine

## 2019-10-17 VITALS — BP 138/88 | Ht 68.5 in | Wt 295.0 lb

## 2019-10-17 DIAGNOSIS — S92502A Displaced unspecified fracture of left lesser toe(s), initial encounter for closed fracture: Secondary | ICD-10-CM | POA: Insufficient documentation

## 2019-10-17 DIAGNOSIS — S92502D Displaced unspecified fracture of left lesser toe(s), subsequent encounter for fracture with routine healing: Secondary | ICD-10-CM | POA: Diagnosis not present

## 2019-10-17 NOTE — Progress Notes (Signed)
Maria [name] - 59 y.o. female MRN [id number]  Date of birth: [birth year]    SUBJECTIVE:      Chief Complaint:/ HPI:  F/u closed fracture of L 4th and 5th toes: Patient presents to sports medicine clinic today for follow-up of recent closed fractures of the fourth and fifth digits of her left foot after accidentally hitting a cinder block (stubbing the toe).  She reports that for about 2 weeks she stayed in bed to rest and elevate the foot, had great difficulty and pain with ambulation, hardly felt like getting out of bed to use the bathroom.  When she was walking she was using a postop shoe.    She walks into clinic today wearing flip-flops, able to walk on her own without any sort of assistive device.  Reports she is doing much better.  Has not had to use the postop shoe this past week.  Reports that she continues to have some numbness of the fourth and fifth toes.  Also reports some mild tenderness to palpation between the second and third metatarsals distally.  ROS:     See HPI  PERTINENT  PMH / PSH FH / / SH:  Past Medical, Surgical, Social, and Family History Reviewed & Updated in the EMR.  Pertinent findings include:  Patient Active Problem List   Diagnosis Date Noted  . Closed fracture of fourth toe of left foot 10/17/2019  . Achilles tendinitis of right lower extremity 12/23/2017  . 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 Cervical disc disorder with radiculopathy of cervical region 02/05/2016  . Osteoarthritis of left hip 08/22/2014  . Radicular pain in left arm 09/27/2013  . Rotator cuff syndrome 06/29/2011  . S/P TKR (total knee replacement) 01/17/2011  . GOUT, UNSPECIFIED 10/09/2009  . HEMORRHOIDS, EXTERNAL 06/17/2009  . Depression 04/23/2009  . NEPHROLITHIASIS, RECURRENT 04/23/2009  . Carpal tunnel syndrome on left 01/26/2009  . Disorder of bursae and tendons in shoulder region 09/19/2008  . GERD 02/25/2008  . Diabetes type 2, uncontrolled ([place])  04/26/2006  . Hyperlipidemia 04/26/2006  . ANXIETY STATE NOS 04/26/2006  . Obstructive sleep apnea 04/26/2006  . MORBID OBESITY 01/11/2006  . Essential hypertension 01/11/2006  . ALLERGIC RHINITIS 01/11/2006  . DYSFUNCTIONAL UTERINE BLEEDING 01/11/2006    OBJECTIVE: Ht 5' 8.5" (1.74 m)   Wt 295 lb (133.8 kg)   LMP 04/11/2009   BMI 44.20 kg/m   Physical Exam:  Vital signs are reviewed.  GEN: Alert and oriented, NAD Pulm: Breathing unlabored PSY: normal mood, congruent affect  MSK: Left foot: No deformity appreciated to left fourth and fifth digits, minimal swelling appreciated but no ecchymoses or erythema; left fourth and fifth digits nontender to palpation, somewhat limited range of motion in fourth and fifth digits in comparison to right foot, did not test strength; patient able to ambulate with normal gait  ASSESSMENT & PLAN:  1.  Left fourth and fifth toe phalangeal fractures, closed: Much improved from previous visit -Recommend exercises and ambulation that do not create more pain -Can use Aleve 500 mg twice daily -Continue baseline Roxicodone 15 mg -Expect patient to have some soreness with trying to wear tennis shoes. -No x-rays recommended at this time  Milus Banister, [place], PGY-3 10/17/2019 1:07 PM   Patient seen evaluate the resident.  I agree with the above plan of care.  Patient is doing very well.  She may increase activity as tolerated and will follow up with me as needed.

## 2019-10-27 DIAGNOSIS — R110 Nausea: Secondary | ICD-10-CM | POA: Diagnosis not present

## 2019-10-27 DIAGNOSIS — Z9049 Acquired absence of other specified parts of digestive tract: Secondary | ICD-10-CM | POA: Diagnosis not present

## 2019-10-27 DIAGNOSIS — I10 Essential (primary) hypertension: Secondary | ICD-10-CM | POA: Diagnosis not present

## 2019-10-27 DIAGNOSIS — E119 Type 2 diabetes mellitus without complications: Secondary | ICD-10-CM | POA: Diagnosis not present

## 2019-10-27 DIAGNOSIS — G8929 Other chronic pain: Secondary | ICD-10-CM | POA: Diagnosis not present

## 2019-10-27 DIAGNOSIS — S8391XA Sprain of unspecified site of right knee, initial encounter: Secondary | ICD-10-CM | POA: Diagnosis not present

## 2019-10-27 DIAGNOSIS — Z794 Long term (current) use of insulin: Secondary | ICD-10-CM | POA: Diagnosis not present

## 2019-10-27 DIAGNOSIS — M25561 Pain in right knee: Secondary | ICD-10-CM | POA: Diagnosis not present

## 2019-10-27 DIAGNOSIS — R200 Anesthesia of skin: Secondary | ICD-10-CM | POA: Diagnosis not present

## 2019-10-27 DIAGNOSIS — Z96651 Presence of right artificial knee joint: Secondary | ICD-10-CM | POA: Diagnosis not present

## 2019-10-27 DIAGNOSIS — Z79899 Other long term (current) drug therapy: Secondary | ICD-10-CM | POA: Diagnosis not present

## 2019-11-01 DIAGNOSIS — G894 Chronic pain syndrome: Secondary | ICD-10-CM | POA: Diagnosis not present

## 2019-11-01 DIAGNOSIS — G8929 Other chronic pain: Secondary | ICD-10-CM | POA: Diagnosis not present

## 2019-11-01 DIAGNOSIS — E119 Type 2 diabetes mellitus without complications: Secondary | ICD-10-CM | POA: Diagnosis not present

## 2019-11-01 DIAGNOSIS — Z09 Encounter for follow-up examination after completed treatment for conditions other than malignant neoplasm: Secondary | ICD-10-CM | POA: Diagnosis not present

## 2019-11-01 DIAGNOSIS — M545 Low back pain: Secondary | ICD-10-CM | POA: Diagnosis not present

## 2019-11-01 DIAGNOSIS — M79672 Pain in left foot: Secondary | ICD-10-CM | POA: Diagnosis not present

## 2019-11-01 DIAGNOSIS — Z79899 Other long term (current) drug therapy: Secondary | ICD-10-CM | POA: Diagnosis not present

## 2019-11-01 DIAGNOSIS — Z9181 History of falling: Secondary | ICD-10-CM | POA: Diagnosis not present

## 2019-11-22 DIAGNOSIS — E119 Type 2 diabetes mellitus without complications: Secondary | ICD-10-CM | POA: Diagnosis not present

## 2019-11-22 DIAGNOSIS — Z7689 Persons encountering health services in other specified circumstances: Secondary | ICD-10-CM | POA: Diagnosis not present

## 2019-11-22 DIAGNOSIS — R600 Localized edema: Secondary | ICD-10-CM | POA: Diagnosis not present

## 2019-11-22 DIAGNOSIS — E538 Deficiency of other specified B group vitamins: Secondary | ICD-10-CM | POA: Diagnosis not present

## 2019-11-22 DIAGNOSIS — R42 Dizziness and giddiness: Secondary | ICD-10-CM | POA: Diagnosis not present

## 2019-11-22 DIAGNOSIS — E559 Vitamin D deficiency, unspecified: Secondary | ICD-10-CM | POA: Diagnosis not present

## 2019-11-22 DIAGNOSIS — R5383 Other fatigue: Secondary | ICD-10-CM | POA: Diagnosis not present

## 2019-11-22 DIAGNOSIS — Z79899 Other long term (current) drug therapy: Secondary | ICD-10-CM | POA: Diagnosis not present

## 2019-11-22 DIAGNOSIS — R0602 Shortness of breath: Secondary | ICD-10-CM | POA: Diagnosis not present

## 2019-11-22 DIAGNOSIS — I639 Cerebral infarction, unspecified: Secondary | ICD-10-CM | POA: Diagnosis not present

## 2019-11-22 DIAGNOSIS — M129 Arthropathy, unspecified: Secondary | ICD-10-CM | POA: Diagnosis not present

## 2019-11-22 DIAGNOSIS — N951 Menopausal and female climacteric states: Secondary | ICD-10-CM | POA: Diagnosis not present

## 2019-11-22 DIAGNOSIS — K219 Gastro-esophageal reflux disease without esophagitis: Secondary | ICD-10-CM | POA: Diagnosis not present

## 2019-11-22 DIAGNOSIS — D539 Nutritional anemia, unspecified: Secondary | ICD-10-CM | POA: Diagnosis not present

## 2019-11-22 DIAGNOSIS — J309 Allergic rhinitis, unspecified: Secondary | ICD-10-CM | POA: Diagnosis not present

## 2019-11-22 DIAGNOSIS — I10 Essential (primary) hypertension: Secondary | ICD-10-CM | POA: Diagnosis not present

## 2019-11-22 DIAGNOSIS — E7800 Pure hypercholesterolemia, unspecified: Secondary | ICD-10-CM | POA: Diagnosis not present

## 2019-11-22 DIAGNOSIS — Z20822 Contact with and (suspected) exposure to covid-19: Secondary | ICD-10-CM | POA: Diagnosis not present

## 2019-12-02 DIAGNOSIS — Z79899 Other long term (current) drug therapy: Secondary | ICD-10-CM | POA: Diagnosis not present

## 2019-12-02 DIAGNOSIS — M79672 Pain in left foot: Secondary | ICD-10-CM | POA: Diagnosis not present

## 2019-12-02 DIAGNOSIS — G8929 Other chronic pain: Secondary | ICD-10-CM | POA: Diagnosis not present

## 2019-12-02 DIAGNOSIS — E119 Type 2 diabetes mellitus without complications: Secondary | ICD-10-CM | POA: Diagnosis not present

## 2019-12-02 DIAGNOSIS — M109 Gout, unspecified: Secondary | ICD-10-CM | POA: Diagnosis not present

## 2019-12-02 DIAGNOSIS — M545 Low back pain: Secondary | ICD-10-CM | POA: Diagnosis not present

## 2019-12-03 DIAGNOSIS — Z79899 Other long term (current) drug therapy: Secondary | ICD-10-CM | POA: Diagnosis not present

## 2019-12-03 DIAGNOSIS — R42 Dizziness and giddiness: Secondary | ICD-10-CM | POA: Diagnosis not present

## 2019-12-03 DIAGNOSIS — I639 Cerebral infarction, unspecified: Secondary | ICD-10-CM | POA: Diagnosis not present

## 2019-12-09 DIAGNOSIS — R05 Cough: Secondary | ICD-10-CM | POA: Diagnosis not present

## 2019-12-09 DIAGNOSIS — U071 COVID-19: Secondary | ICD-10-CM | POA: Diagnosis not present

## 2019-12-09 DIAGNOSIS — J029 Acute pharyngitis, unspecified: Secondary | ICD-10-CM | POA: Diagnosis not present

## 2019-12-10 DIAGNOSIS — U071 COVID-19: Secondary | ICD-10-CM | POA: Diagnosis not present

## 2019-12-10 DIAGNOSIS — Z9189 Other specified personal risk factors, not elsewhere classified: Secondary | ICD-10-CM | POA: Diagnosis not present

## 2019-12-12 DIAGNOSIS — Z9189 Other specified personal risk factors, not elsewhere classified: Secondary | ICD-10-CM | POA: Diagnosis not present

## 2019-12-12 DIAGNOSIS — U071 COVID-19: Secondary | ICD-10-CM | POA: Diagnosis not present

## 2019-12-17 DIAGNOSIS — R0602 Shortness of breath: Secondary | ICD-10-CM | POA: Diagnosis not present

## 2019-12-18 ENCOUNTER — Telehealth: Payer: Self-pay | Admitting: Nurse Practitioner

## 2019-12-18 ENCOUNTER — Ambulatory Visit (HOSPITAL_COMMUNITY)
Admission: RE | Admit: 2019-12-18 | Discharge: 2019-12-18 | Disposition: A | Discharge status: Home / Self Care | Payer: Medicare Other | Source: Ambulatory Visit | Attending: Pulmonary Disease | Admitting: Pulmonary Disease

## 2019-12-18 ENCOUNTER — Encounter: Payer: Self-pay | Admitting: Nurse Practitioner

## 2019-12-18 ENCOUNTER — Other Ambulatory Visit: Payer: Self-pay | Admitting: Nurse Practitioner

## 2019-12-18 DIAGNOSIS — I10 Essential (primary) hypertension: Secondary | ICD-10-CM

## 2019-12-18 DIAGNOSIS — E119 Type 2 diabetes mellitus without complications: Secondary | ICD-10-CM

## 2019-12-18 DIAGNOSIS — E669 Obesity, unspecified: Secondary | ICD-10-CM | POA: Diagnosis present

## 2019-12-18 DIAGNOSIS — Z23 Encounter for immunization: Secondary | ICD-10-CM | POA: Insufficient documentation

## 2019-12-18 DIAGNOSIS — U071 COVID-19: Secondary | ICD-10-CM | POA: Diagnosis not present

## 2019-12-18 MED ORDER — SODIUM CHLORIDE 0.9 % IV BOLUS
500.0000 mL | Freq: Once | INTRAVENOUS | Status: AC
  Administered 2019-12-18: 500 mL via INTRAVENOUS

## 2019-12-18 MED ORDER — ALBUTEROL SULFATE HFA 108 (90 BASE) MCG/ACT IN AERS: 2.0000 | INHALATION_SPRAY | Freq: Once | RESPIRATORY_TRACT | Status: DC | PRN

## 2019-12-18 MED ORDER — FAMOTIDINE IN NACL 20-0.9 MG/50ML-% IV SOLN: 20.0000 mg | Freq: Once | INTRAVENOUS | Status: DC | PRN

## 2019-12-18 MED ORDER — DIPHENHYDRAMINE HCL 50 MG/ML IJ SOLN: 50.0000 mg | Freq: Once | INTRAMUSCULAR | Status: DC | PRN

## 2019-12-18 MED ORDER — SODIUM CHLORIDE 0.9 % IV SOLN: INTRAVENOUS | Status: DC | PRN

## 2019-12-18 MED ORDER — METHYLPREDNISOLONE SODIUM SUCC 125 MG IJ SOLR: 125.0000 mg | Freq: Once | INTRAMUSCULAR | Status: DC | PRN

## 2019-12-18 MED ORDER — SODIUM CHLORIDE 0.9 % IV SOLN
1200.0000 mg | Freq: Once | INTRAVENOUS | Status: AC
  Administered 2019-12-18: 1200 mg via INTRAVENOUS
  Filled 2019-12-18: qty 10

## 2019-12-18 MED ORDER — EPINEPHRINE 0.3 MG/0.3ML IJ SOAJ: 0.3000 mg | Freq: Once | INTRAMUSCULAR | Status: DC | PRN

## 2019-12-18 NOTE — Progress Notes (Signed)
I connected by phone with Maria [name] on 12/18/2019 at 11:14 AM to discuss the potential use of a new treatment for mild to moderate COVID-19 viral infection in non-hospitalized patients.  This patient is a 59 y.o. female that meets the FDA criteria for Emergency Use Authorization of COVID monoclonal antibody casirivimab/imdevimab.  Has a (+) direct SARS-CoV-2 viral test result  Has mild or moderate COVID-19   Is NOT hospitalized due to COVID-19  Is within 10 days of symptom onset  Has at least one of the high risk factor(s) for progression to severe COVID-19 and/or hospitalization as defined in EUA.  Specific high risk criteria : Diabetes; HTN; BMI   I have spoken and communicated the following to the patient or parent/caregiver regarding COVID monoclonal antibody treatment:  1. FDA has authorized the emergency use for the treatment of mild to moderate COVID-19 in adults and pediatric patients with positive results of direct SARS-CoV-2 viral testing who are 55 years of age and older weighing at least 40 kg, and who are at high risk for progressing to severe COVID-19 and/or hospitalization.  2. The significant known and potential risks and benefits of COVID monoclonal antibody, and the extent to which such potential risks and benefits are unknown.  3. Information on available alternative treatments and the risks and benefits of those alternatives, including clinical trials.  4. Patients treated with COVID monoclonal antibody should continue to self-isolate and use infection control measures (e.g., wear mask, isolate, social distance, avoid sharing personal items, clean and disinfect "high touch" surfaces, and frequent handwashing) according to CDC guidelines.   5. The patient or parent/caregiver has the option to accept or refuse COVID monoclonal antibody treatment.  After reviewing this information with the patient, The patient agreed to proceed with receiving casirivimab\imdevimab  infusion and will be provided a copy of the Fact sheet prior to receiving the infusion.  Murray Hodgkins, NP 12/18/2019 11:14 AM

## 2019-12-18 NOTE — Discharge Instructions (Signed)

## 2019-12-18 NOTE — Progress Notes (Signed)
  Diagnosis: COVID-19  Physician: Dr  Joya Gaskins    Patient came in with low BP. She states diarrhea for 10 days. No history of CHF or cardiac issues. Order from PA for 500 ml bolus of NS.    Procedure: [facility] Infusion [facility] Med: casirivimab\imdevimab infusion - Provided patient with casirivimab\imdevimab fact sheet for patients, parents and caregivers prior to infusion.  Complications: No immediate complications noted.  Discharge: Discharged home   Maria [name] 12/18/2019

## 2019-12-18 NOTE — Telephone Encounter (Signed)
I connected by phone with Maria [name] on 12/18/2019 at 11:14 AM to discuss the potential use of a new treatment for mild to moderate COVID-19 viral infection in non-hospitalized patients.  This patient is a 59 y.o. female that meets the FDA criteria for Emergency Use Authorization of COVID monoclonal antibody casirivimab/imdevimab.  Has a (+) direct SARS-CoV-2 viral test result  Has mild or moderate COVID-19   Is NOT hospitalized due to COVID-19  Is within 10 days of symptom onset  Has at least one of the high risk factor(s) for progression to severe COVID-19 and/or hospitalization as defined in EUA.  Specific high risk criteria : Diabetes; HTN; BMI   I have spoken and communicated the following to the patient or parent/caregiver regarding COVID monoclonal antibody treatment:  1. FDA has authorized the emergency use for the treatment of mild to moderate COVID-19 in adults and pediatric patients with positive results of direct SARS-CoV-2 viral testing who are 12 years of age and older weighing at least 40 kg, and who are at high risk for progressing to severe COVID-19 and/or hospitalization.  2. The significant known and potential risks and benefits of COVID monoclonal antibody, and the extent to which such potential risks and benefits are unknown.  3. Information on available alternative treatments and the risks and benefits of those alternatives, including clinical trials.  4. Patients treated with COVID monoclonal antibody should continue to self-isolate and use infection control measures (e.g., wear mask, isolate, social distance, avoid sharing personal items, clean and disinfect "high touch" surfaces, and frequent handwashing) according to CDC guidelines.   5. The patient or parent/caregiver has the option to accept or refuse COVID monoclonal antibody treatment.  After reviewing this information with the patient, The patient agreed to proceed with receiving casirivimab\imdevimab  infusion and will be provided a copy of the Fact sheet prior to receiving the infusion.  Murray Hodgkins, NP 12/18/2019 11:14 AM

## 2019-12-23 DIAGNOSIS — U071 COVID-19: Secondary | ICD-10-CM | POA: Diagnosis not present

## 2019-12-23 DIAGNOSIS — Z20822 Contact with and (suspected) exposure to covid-19: Secondary | ICD-10-CM | POA: Diagnosis not present

## 2019-12-30 DIAGNOSIS — E559 Vitamin D deficiency, unspecified: Secondary | ICD-10-CM | POA: Diagnosis not present

## 2019-12-30 DIAGNOSIS — M545 Low back pain: Secondary | ICD-10-CM | POA: Diagnosis not present

## 2019-12-30 DIAGNOSIS — E119 Type 2 diabetes mellitus without complications: Secondary | ICD-10-CM | POA: Diagnosis not present

## 2019-12-30 DIAGNOSIS — M109 Gout, unspecified: Secondary | ICD-10-CM | POA: Diagnosis not present

## 2019-12-30 DIAGNOSIS — Z79899 Other long term (current) drug therapy: Secondary | ICD-10-CM | POA: Diagnosis not present

## 2019-12-30 DIAGNOSIS — R0602 Shortness of breath: Secondary | ICD-10-CM | POA: Diagnosis not present

## 2019-12-30 DIAGNOSIS — M79672 Pain in left foot: Secondary | ICD-10-CM | POA: Diagnosis not present

## 2019-12-30 DIAGNOSIS — G8929 Other chronic pain: Secondary | ICD-10-CM | POA: Diagnosis not present

## 2019-12-30 DIAGNOSIS — Z20822 Contact with and (suspected) exposure to covid-19: Secondary | ICD-10-CM | POA: Diagnosis not present

## 2019-12-31 ENCOUNTER — Ambulatory Visit: Payer: PPO | Admitting: Sports Medicine

## 2020-01-14 DIAGNOSIS — B379 Candidiasis, unspecified: Secondary | ICD-10-CM | POA: Diagnosis not present

## 2020-01-14 DIAGNOSIS — Z9189 Other specified personal risk factors, not elsewhere classified: Secondary | ICD-10-CM | POA: Diagnosis not present

## 2020-01-14 DIAGNOSIS — B9689 Other specified bacterial agents as the cause of diseases classified elsewhere: Secondary | ICD-10-CM | POA: Diagnosis not present

## 2020-01-14 DIAGNOSIS — J329 Chronic sinusitis, unspecified: Secondary | ICD-10-CM | POA: Diagnosis not present

## 2020-01-27 DIAGNOSIS — Z23 Encounter for immunization: Secondary | ICD-10-CM | POA: Diagnosis not present

## 2020-01-27 DIAGNOSIS — M545 Low back pain, unspecified: Secondary | ICD-10-CM | POA: Diagnosis not present

## 2020-01-27 DIAGNOSIS — M109 Gout, unspecified: Secondary | ICD-10-CM | POA: Diagnosis not present

## 2020-01-27 DIAGNOSIS — R0602 Shortness of breath: Secondary | ICD-10-CM | POA: Diagnosis not present

## 2020-01-27 DIAGNOSIS — Z79899 Other long term (current) drug therapy: Secondary | ICD-10-CM | POA: Diagnosis not present

## 2020-01-27 DIAGNOSIS — M79672 Pain in left foot: Secondary | ICD-10-CM | POA: Diagnosis not present

## 2020-01-27 DIAGNOSIS — G8929 Other chronic pain: Secondary | ICD-10-CM | POA: Diagnosis not present

## 2020-02-12 DIAGNOSIS — M79672 Pain in left foot: Secondary | ICD-10-CM | POA: Diagnosis not present

## 2020-02-12 DIAGNOSIS — M545 Low back pain, unspecified: Secondary | ICD-10-CM | POA: Diagnosis not present

## 2020-02-12 DIAGNOSIS — Z79899 Other long term (current) drug therapy: Secondary | ICD-10-CM | POA: Diagnosis not present

## 2020-02-12 DIAGNOSIS — M109 Gout, unspecified: Secondary | ICD-10-CM | POA: Diagnosis not present

## 2020-02-12 DIAGNOSIS — E119 Type 2 diabetes mellitus without complications: Secondary | ICD-10-CM | POA: Diagnosis not present

## 2020-02-12 DIAGNOSIS — G8929 Other chronic pain: Secondary | ICD-10-CM | POA: Diagnosis not present

## 2020-02-12 DIAGNOSIS — J029 Acute pharyngitis, unspecified: Secondary | ICD-10-CM | POA: Diagnosis not present

## 2020-02-24 DIAGNOSIS — J312 Chronic pharyngitis: Secondary | ICD-10-CM | POA: Diagnosis not present

## 2020-02-24 DIAGNOSIS — G8929 Other chronic pain: Secondary | ICD-10-CM | POA: Diagnosis not present

## 2020-02-24 DIAGNOSIS — M545 Low back pain, unspecified: Secondary | ICD-10-CM | POA: Diagnosis not present

## 2020-02-24 DIAGNOSIS — E119 Type 2 diabetes mellitus without complications: Secondary | ICD-10-CM | POA: Diagnosis not present

## 2020-02-24 DIAGNOSIS — M79672 Pain in left foot: Secondary | ICD-10-CM | POA: Diagnosis not present

## 2020-02-24 DIAGNOSIS — Z79899 Other long term (current) drug therapy: Secondary | ICD-10-CM | POA: Diagnosis not present

## 2020-02-24 DIAGNOSIS — M109 Gout, unspecified: Secondary | ICD-10-CM | POA: Diagnosis not present

## 2020-02-24 DIAGNOSIS — R109 Unspecified abdominal pain: Secondary | ICD-10-CM | POA: Diagnosis not present

## 2020-02-27 ENCOUNTER — Other Ambulatory Visit: Payer: Self-pay | Admitting: Nurse Practitioner

## 2020-02-27 DIAGNOSIS — Z1231 Encounter for screening mammogram for malignant neoplasm of breast: Secondary | ICD-10-CM

## 2020-03-10 ENCOUNTER — Ambulatory Visit (INDEPENDENT_AMBULATORY_CARE_PROVIDER_SITE_OTHER): Payer: PPO | Admitting: Sports Medicine

## 2020-03-10 ENCOUNTER — Encounter: Payer: Self-pay | Admitting: Sports Medicine

## 2020-03-10 ENCOUNTER — Other Ambulatory Visit: Payer: Self-pay | Admitting: Family Medicine

## 2020-03-10 ENCOUNTER — Other Ambulatory Visit: Payer: Self-pay

## 2020-03-10 VITALS — BP 153/92 | Ht 68.0 in | Wt 298.0 lb

## 2020-03-10 DIAGNOSIS — M109 Gout, unspecified: Secondary | ICD-10-CM

## 2020-03-10 DIAGNOSIS — M25512 Pain in left shoulder: Secondary | ICD-10-CM

## 2020-03-10 DIAGNOSIS — M25511 Pain in right shoulder: Secondary | ICD-10-CM | POA: Diagnosis not present

## 2020-03-10 MED ORDER — METHYLPREDNISOLONE ACETATE 40 MG/ML IJ SUSP
40.0000 mg | Freq: Once | INTRAMUSCULAR | Status: AC
  Administered 2020-03-10: 40 mg via INTRA_ARTICULAR

## 2020-03-10 NOTE — Progress Notes (Signed)
   Subjective:    Patient ID: Maria [name], female    DOB: [birth year], 59 y.o.   MRN: [id number]  HPI chief complaint: Bilateral shoulder pain  Patient comes in today with returning bilateral shoulder pain, left greater than right.  She is requesting repeat cortisone injections into each shoulder.  Cortisone injections have been effective in the past.  Pain is identical in nature to what she is experienced previously.  No recent trauma.  She did have difficulty sleeping on her left side last night due to her pain.  Interim medical history reviewed Medications reviewed Allergies reviewed    Review of Systems As above    Objective:   Physical Exam Well-developed, well-nourished.  Examination of each shoulder shows limited range of motion in all planes secondary to pain.  No tenderness to palpation.  Positive empty can bilaterally.  Positive Hawkins bilaterally.  Rotator cuff strength is 5/5 bilaterally but reproduces pain.  Neurovascularly intact distally.       Assessment & Plan:   Returning bilateral shoulder pain likely secondary to rotator cuff tendinopathy  Each of her shoulders were injected today with cortisone.  She tolerated this without difficulty.  If symptoms persist then we will get updated imaging.  Follow-up for ongoing or recalcitrant issues.  Consent obtained and verified. Time-out conducted. Noted no overlying erythema, induration, or other signs of local infection. Skin prepped in a sterile fashion. Topical analgesic spray: Ethyl chloride. Joint: left shoulder (subacromial) Needle: 25g 1.5 inch Completed without difficulty. Meds: 3cc 1% xylocaine, 1cc (40mg ) depomedrol  Advised to call if fevers/chills, erythema, induration, drainage, or persistent bleeding.  Consent obtained and verified. Time-out conducted. Noted no overlying erythema, induration, or other signs of local infection. Skin prepped in a sterile fashion. Topical analgesic spray: Ethyl  chloride. Joint: right shoulder (subacromial) Needle: 25g 1.5 inch Completed without difficulty. Meds: 3cc 1% xylocaine, 1cc (40mg ) depomedrol  Advised to call if fevers/chills, erythema, induration, drainage, or persistent bleeding.

## 2020-03-19 DIAGNOSIS — M545 Low back pain, unspecified: Secondary | ICD-10-CM | POA: Diagnosis not present

## 2020-03-19 DIAGNOSIS — M109 Gout, unspecified: Secondary | ICD-10-CM | POA: Diagnosis not present

## 2020-03-19 DIAGNOSIS — R3989 Other symptoms and signs involving the genitourinary system: Secondary | ICD-10-CM | POA: Diagnosis not present

## 2020-03-19 DIAGNOSIS — Z79899 Other long term (current) drug therapy: Secondary | ICD-10-CM | POA: Diagnosis not present

## 2020-03-19 DIAGNOSIS — M79672 Pain in left foot: Secondary | ICD-10-CM | POA: Diagnosis not present

## 2020-03-19 DIAGNOSIS — G8929 Other chronic pain: Secondary | ICD-10-CM | POA: Diagnosis not present

## 2020-03-19 DIAGNOSIS — E119 Type 2 diabetes mellitus without complications: Secondary | ICD-10-CM | POA: Diagnosis not present

## 2020-03-25 DIAGNOSIS — Z9229 Personal history of other drug therapy: Secondary | ICD-10-CM | POA: Diagnosis not present

## 2020-03-25 DIAGNOSIS — R1319 Other dysphagia: Secondary | ICD-10-CM | POA: Diagnosis not present

## 2020-03-30 DIAGNOSIS — Z1211 Encounter for screening for malignant neoplasm of colon: Secondary | ICD-10-CM | POA: Diagnosis not present

## 2020-03-30 DIAGNOSIS — Z01818 Encounter for other preprocedural examination: Secondary | ICD-10-CM | POA: Diagnosis not present

## 2020-03-30 DIAGNOSIS — E119 Type 2 diabetes mellitus without complications: Secondary | ICD-10-CM | POA: Diagnosis not present

## 2020-03-30 DIAGNOSIS — R1319 Other dysphagia: Secondary | ICD-10-CM | POA: Diagnosis not present

## 2020-04-15 DIAGNOSIS — K2970 Gastritis, unspecified, without bleeding: Secondary | ICD-10-CM | POA: Diagnosis not present

## 2020-04-15 DIAGNOSIS — Z9884 Bariatric surgery status: Secondary | ICD-10-CM | POA: Diagnosis not present

## 2020-04-15 DIAGNOSIS — A048 Other specified bacterial intestinal infections: Secondary | ICD-10-CM | POA: Diagnosis not present

## 2020-04-16 DIAGNOSIS — M109 Gout, unspecified: Secondary | ICD-10-CM | POA: Diagnosis not present

## 2020-04-16 DIAGNOSIS — M545 Low back pain, unspecified: Secondary | ICD-10-CM | POA: Diagnosis not present

## 2020-04-16 DIAGNOSIS — Z79899 Other long term (current) drug therapy: Secondary | ICD-10-CM | POA: Diagnosis not present

## 2020-04-16 DIAGNOSIS — G8929 Other chronic pain: Secondary | ICD-10-CM | POA: Diagnosis not present

## 2020-04-16 DIAGNOSIS — Z9189 Other specified personal risk factors, not elsewhere classified: Secondary | ICD-10-CM | POA: Diagnosis not present

## 2020-04-24 DIAGNOSIS — J9811 Atelectasis: Secondary | ICD-10-CM | POA: Diagnosis not present

## 2020-04-24 DIAGNOSIS — M549 Dorsalgia, unspecified: Secondary | ICD-10-CM | POA: Diagnosis not present

## 2020-04-24 DIAGNOSIS — N200 Calculus of kidney: Secondary | ICD-10-CM | POA: Diagnosis not present

## 2020-04-24 DIAGNOSIS — R1011 Right upper quadrant pain: Secondary | ICD-10-CM | POA: Diagnosis not present

## 2020-04-24 DIAGNOSIS — G8929 Other chronic pain: Secondary | ICD-10-CM | POA: Diagnosis not present

## 2020-04-24 DIAGNOSIS — E041 Nontoxic single thyroid nodule: Secondary | ICD-10-CM | POA: Diagnosis not present

## 2020-04-24 DIAGNOSIS — R110 Nausea: Secondary | ICD-10-CM | POA: Diagnosis not present

## 2020-04-24 DIAGNOSIS — E119 Type 2 diabetes mellitus without complications: Secondary | ICD-10-CM | POA: Diagnosis not present

## 2020-04-24 DIAGNOSIS — D3500 Benign neoplasm of unspecified adrenal gland: Secondary | ICD-10-CM | POA: Diagnosis not present

## 2020-04-24 DIAGNOSIS — R109 Unspecified abdominal pain: Secondary | ICD-10-CM | POA: Diagnosis not present

## 2020-04-24 DIAGNOSIS — K5900 Constipation, unspecified: Secondary | ICD-10-CM | POA: Diagnosis not present

## 2020-04-24 DIAGNOSIS — K449 Diaphragmatic hernia without obstruction or gangrene: Secondary | ICD-10-CM | POA: Diagnosis not present

## 2020-04-24 DIAGNOSIS — D3501 Benign neoplasm of right adrenal gland: Secondary | ICD-10-CM | POA: Diagnosis not present

## 2020-04-24 DIAGNOSIS — R079 Chest pain, unspecified: Secondary | ICD-10-CM | POA: Diagnosis not present

## 2020-04-24 DIAGNOSIS — R7989 Other specified abnormal findings of blood chemistry: Secondary | ICD-10-CM | POA: Diagnosis not present

## 2020-04-24 DIAGNOSIS — I10 Essential (primary) hypertension: Secondary | ICD-10-CM | POA: Diagnosis not present

## 2020-04-30 DIAGNOSIS — G8929 Other chronic pain: Secondary | ICD-10-CM | POA: Diagnosis not present

## 2020-04-30 DIAGNOSIS — M545 Low back pain, unspecified: Secondary | ICD-10-CM | POA: Diagnosis not present

## 2020-04-30 DIAGNOSIS — R768 Other specified abnormal immunological findings in serum: Secondary | ICD-10-CM | POA: Diagnosis not present

## 2020-04-30 DIAGNOSIS — Z79899 Other long term (current) drug therapy: Secondary | ICD-10-CM | POA: Diagnosis not present

## 2020-04-30 DIAGNOSIS — M109 Gout, unspecified: Secondary | ICD-10-CM | POA: Diagnosis not present

## 2020-05-06 DIAGNOSIS — R1319 Other dysphagia: Secondary | ICD-10-CM | POA: Diagnosis not present

## 2020-05-06 DIAGNOSIS — Z1211 Encounter for screening for malignant neoplasm of colon: Secondary | ICD-10-CM | POA: Diagnosis not present

## 2020-05-06 DIAGNOSIS — Z01818 Encounter for other preprocedural examination: Secondary | ICD-10-CM | POA: Diagnosis not present

## 2020-05-07 ENCOUNTER — Ambulatory Visit: Payer: PPO | Admitting: Sports Medicine

## 2020-05-12 DIAGNOSIS — Z79899 Other long term (current) drug therapy: Secondary | ICD-10-CM | POA: Diagnosis not present

## 2020-05-12 DIAGNOSIS — M109 Gout, unspecified: Secondary | ICD-10-CM | POA: Diagnosis not present

## 2020-05-12 DIAGNOSIS — R768 Other specified abnormal immunological findings in serum: Secondary | ICD-10-CM | POA: Diagnosis not present

## 2020-05-12 DIAGNOSIS — E119 Type 2 diabetes mellitus without complications: Secondary | ICD-10-CM | POA: Diagnosis not present

## 2020-05-12 DIAGNOSIS — M545 Low back pain, unspecified: Secondary | ICD-10-CM | POA: Diagnosis not present

## 2020-05-12 DIAGNOSIS — G8929 Other chronic pain: Secondary | ICD-10-CM | POA: Diagnosis not present

## 2020-05-20 DIAGNOSIS — B379 Candidiasis, unspecified: Secondary | ICD-10-CM | POA: Diagnosis not present

## 2020-05-20 DIAGNOSIS — J029 Acute pharyngitis, unspecified: Secondary | ICD-10-CM | POA: Diagnosis not present

## 2020-05-20 DIAGNOSIS — Z9189 Other specified personal risk factors, not elsewhere classified: Secondary | ICD-10-CM | POA: Diagnosis not present

## 2020-05-20 DIAGNOSIS — T3695XA Adverse effect of unspecified systemic antibiotic, initial encounter: Secondary | ICD-10-CM | POA: Diagnosis not present

## 2020-06-09 DIAGNOSIS — R768 Other specified abnormal immunological findings in serum: Secondary | ICD-10-CM | POA: Diagnosis not present

## 2020-06-09 DIAGNOSIS — G8929 Other chronic pain: Secondary | ICD-10-CM | POA: Diagnosis not present

## 2020-06-09 DIAGNOSIS — M109 Gout, unspecified: Secondary | ICD-10-CM | POA: Diagnosis not present

## 2020-06-09 DIAGNOSIS — Z79899 Other long term (current) drug therapy: Secondary | ICD-10-CM | POA: Diagnosis not present

## 2020-06-09 DIAGNOSIS — E119 Type 2 diabetes mellitus without complications: Secondary | ICD-10-CM | POA: Diagnosis not present

## 2020-06-09 DIAGNOSIS — M545 Low back pain, unspecified: Secondary | ICD-10-CM | POA: Diagnosis not present

## 2020-06-18 DIAGNOSIS — H18213 Corneal edema secondary to contact lens, bilateral: Secondary | ICD-10-CM | POA: Diagnosis not present

## 2020-07-02 DIAGNOSIS — M109 Gout, unspecified: Secondary | ICD-10-CM | POA: Diagnosis not present

## 2020-07-02 DIAGNOSIS — R768 Other specified abnormal immunological findings in serum: Secondary | ICD-10-CM | POA: Diagnosis not present

## 2020-07-02 DIAGNOSIS — M545 Low back pain, unspecified: Secondary | ICD-10-CM | POA: Diagnosis not present

## 2020-07-02 DIAGNOSIS — E119 Type 2 diabetes mellitus without complications: Secondary | ICD-10-CM | POA: Diagnosis not present

## 2020-07-02 DIAGNOSIS — G8929 Other chronic pain: Secondary | ICD-10-CM | POA: Diagnosis not present

## 2020-07-02 DIAGNOSIS — Z79899 Other long term (current) drug therapy: Secondary | ICD-10-CM | POA: Diagnosis not present

## 2020-07-03 ENCOUNTER — Emergency Department (HOSPITAL_BASED_OUTPATIENT_CLINIC_OR_DEPARTMENT_OTHER)
Admission: EM | Admit: 2020-07-03 | Discharge: 2020-07-03 | Disposition: A | Discharge status: Home / Self Care | Payer: PPO | Attending: Emergency Medicine | Admitting: Emergency Medicine

## 2020-07-03 ENCOUNTER — Emergency Department (HOSPITAL_BASED_OUTPATIENT_CLINIC_OR_DEPARTMENT_OTHER): Payer: PPO

## 2020-07-03 ENCOUNTER — Encounter (HOSPITAL_BASED_OUTPATIENT_CLINIC_OR_DEPARTMENT_OTHER): Payer: Self-pay | Admitting: Emergency Medicine

## 2020-07-03 ENCOUNTER — Other Ambulatory Visit: Payer: Self-pay

## 2020-07-03 DIAGNOSIS — R609 Edema, unspecified: Secondary | ICD-10-CM | POA: Diagnosis present

## 2020-07-03 DIAGNOSIS — L02416 Cutaneous abscess of left lower limb: Secondary | ICD-10-CM

## 2020-07-03 DIAGNOSIS — E119 Type 2 diabetes mellitus without complications: Secondary | ICD-10-CM | POA: Insufficient documentation

## 2020-07-03 DIAGNOSIS — L02412 Cutaneous abscess of left axilla: Secondary | ICD-10-CM | POA: Insufficient documentation

## 2020-07-03 DIAGNOSIS — M47816 Spondylosis without myelopathy or radiculopathy, lumbar region: Secondary | ICD-10-CM | POA: Diagnosis not present

## 2020-07-03 DIAGNOSIS — Z79899 Other long term (current) drug therapy: Secondary | ICD-10-CM | POA: Insufficient documentation

## 2020-07-03 DIAGNOSIS — I10 Essential (primary) hypertension: Secondary | ICD-10-CM | POA: Diagnosis not present

## 2020-07-03 DIAGNOSIS — Z7984 Long term (current) use of oral hypoglycemic drugs: Secondary | ICD-10-CM | POA: Insufficient documentation

## 2020-07-03 DIAGNOSIS — Z9660 Presence of unspecified orthopedic joint implant: Secondary | ICD-10-CM | POA: Insufficient documentation

## 2020-07-03 DIAGNOSIS — K429 Umbilical hernia without obstruction or gangrene: Secondary | ICD-10-CM | POA: Diagnosis not present

## 2020-07-03 DIAGNOSIS — K449 Diaphragmatic hernia without obstruction or gangrene: Secondary | ICD-10-CM | POA: Diagnosis not present

## 2020-07-03 DIAGNOSIS — R109 Unspecified abdominal pain: Secondary | ICD-10-CM

## 2020-07-03 DIAGNOSIS — K439 Ventral hernia without obstruction or gangrene: Secondary | ICD-10-CM | POA: Diagnosis not present

## 2020-07-03 LAB — URINALYSIS, ROUTINE W REFLEX MICROSCOPIC
Bilirubin Urine: NEGATIVE
Glucose, UA: NEGATIVE mg/dL
Hgb urine dipstick: NEGATIVE
Ketones, ur: NEGATIVE mg/dL
Nitrite: NEGATIVE
Protein, ur: NEGATIVE mg/dL
Specific Gravity, Urine: >1.03 — ABNORMAL HIGH (ref 1.005–1.030)
pH: 6 (ref 5.0–8.0)

## 2020-07-03 LAB — URINALYSIS, MICROSCOPIC (REFLEX)

## 2020-07-03 LAB — CBC WITH DIFFERENTIAL/PLATELET
Abs Immature Granulocytes: 0.01 10*3/uL (ref 0.00–0.07)
Basophils Absolute: 0 10*3/uL (ref 0.0–0.1)
Basophils Relative: 1 %
Eosinophils Absolute: 0.2 10*3/uL (ref 0.0–0.5)
Eosinophils Relative: 2 %
HCT: 36.9 % (ref 36.0–46.0)
Hemoglobin: 11.8 g/dL — ABNORMAL LOW (ref 12.0–15.0)
Immature Granulocytes: 0 %
Lymphocytes Relative: 48 %
Lymphs Abs: 3.4 10*3/uL (ref 0.7–4.0)
MCH: 28.4 pg (ref 26.0–34.0)
MCHC: 32 g/dL (ref 30.0–36.0)
MCV: 88.7 fL (ref 80.0–100.0)
Monocytes Absolute: 0.5 10*3/uL (ref 0.1–1.0)
Monocytes Relative: 7 %
Neutro Abs: 3 10*3/uL (ref 1.7–7.7)
Neutrophils Relative %: 42 %
Platelets: 290 10*3/uL (ref 150–400)
RBC: 4.16 MIL/uL (ref 3.87–5.11)
RDW: 15.3 % (ref 11.5–15.5)
WBC: 7.1 10*3/uL (ref 4.0–10.5)
nRBC: 0 % (ref 0.0–0.2)

## 2020-07-03 LAB — COMPREHENSIVE METABOLIC PANEL
ALT: 12 U/L (ref 0–44)
AST: 15 U/L (ref 15–41)
Albumin: 3.2 g/dL — ABNORMAL LOW (ref 3.5–5.0)
Alkaline Phosphatase: 78 U/L (ref 38–126)
Anion gap: 10 (ref 5–15)
BUN: 19 mg/dL (ref 8–23)
CO2: 28 mmol/L (ref 22–32)
Calcium: 8.8 mg/dL — ABNORMAL LOW (ref 8.9–10.3)
Chloride: 105 mmol/L (ref 98–111)
Creatinine, Ser: 0.96 mg/dL (ref 0.44–1.00)
GFR, Estimated: >60 mL/min (ref >60)
Glucose, Bld: 157 mg/dL — ABNORMAL HIGH (ref 70–99)
Potassium: 3.8 mmol/L (ref 3.5–5.1)
Sodium: 143 mmol/L (ref 135–145)
Total Bilirubin: 0.6 mg/dL (ref 0.3–1.2)
Total Protein: 7.2 g/dL (ref 6.5–8.1)

## 2020-07-03 LAB — LIPASE, BLOOD: Lipase: 32 U/L (ref 11–51)

## 2020-07-03 MED ORDER — HYDROMORPHONE HCL 1 MG/ML IJ SOLN
1.0000 mg | Freq: Once | INTRAMUSCULAR | Status: AC
  Administered 2020-07-03: 1 mg via INTRAVENOUS
  Filled 2020-07-03: qty 1

## 2020-07-03 MED ORDER — LIDOCAINE-EPINEPHRINE (PF) 2 %-1:200000 IJ SOLN
10.0000 mL | Freq: Once | INTRAMUSCULAR | Status: AC
  Administered 2020-07-03: 10 mL via INTRADERMAL
  Filled 2020-07-03: qty 20

## 2020-07-03 MED ORDER — KETOROLAC TROMETHAMINE 15 MG/ML IJ SOLN
15.0000 mg | Freq: Once | INTRAMUSCULAR | Status: AC
  Administered 2020-07-03: 15 mg via INTRAVENOUS
  Filled 2020-07-03: qty 1

## 2020-07-03 MED ORDER — OXYCODONE-ACETAMINOPHEN 5-325 MG PO TABS
2.0000 | ORAL_TABLET | Freq: Once | ORAL | Status: AC
  Administered 2020-07-03: 2 via ORAL
  Filled 2020-07-03: qty 2

## 2020-07-03 NOTE — ED Triage Notes (Signed)
Left flank pain started yesterday, Hx Kidney stones. Denies urinary symptoms  Also reports left thigh/ pubic area  Abscess. Noted redness and swelling.

## 2020-07-03 NOTE — ED Provider Notes (Signed)
[facility] Provider Note   CSN: [id number] Arrival date & time: 07/03/20  0846     History Chief Complaint  Patient presents with  . Flank Pain  . Abscess    Maria [name] is a 60 y.o. female.  HPI 60 year old female presents with left flank pain.  Started yesterday.  It has been constant since it started.  Unable to describe what it feels like besides severe pain.  Has had nausea without vomiting.  No urinary symptoms.  No chest pain, shortness of breath, pleuritic pain.  Sometimes laying on that left side will help it but otherwise nothing makes it better or worse including certain movements.  Has a prior history of multiple kidney stones. Is on oxycodone for pain.  Also has had pain and swelling to her left proximal thigh for about 1 week.  Started off as a small bump.  PCP put on Bactrim 2 days ago but it has not helped.   Past Medical History:  Diagnosis Date  . Allergic rhinitis   . Anemia    1980's  . Anxiety   . Carpal tunnel syndrome, left    pt denies  . Depression   . DUB (dysfunctional uterine bleeding)   . Dysrhythmia    tachycardic  . External hemorrhoid   . GERD (gastroesophageal reflux disease)   . Gout   . History of hiatal hernia   . History of kidney stones   . Hypertension   . Morbid obesity ([place])   . Multiple thyroid nodules   . Obesity   . Osteoarthritis, knee   . Pneumonia   . Recurrent UTI   . Rotator cuff syndrome of left shoulder   . Type II diabetes mellitus [facility])     Patient Active Problem List   Diagnosis Date Noted  . Hypertension   . Type II diabetes mellitus ([place])   . Obesity   . Closed fracture of fourth toe of left foot 10/17/2019  . Achilles tendinitis of right lower extremity 12/23/2017  . 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 Cervical disc disorder with radiculopathy of cervical region 02/05/2016  . Osteoarthritis of left hip 08/22/2014  . Radicular pain in left arm  09/27/2013  . Rotator cuff syndrome 06/29/2011  . S/P TKR (total knee replacement) 01/17/2011  . GOUT, UNSPECIFIED 10/09/2009  . HEMORRHOIDS, EXTERNAL 06/17/2009  . Depression 04/23/2009  . NEPHROLITHIASIS, RECURRENT 04/23/2009  . Carpal tunnel syndrome on left 01/26/2009  . Disorder of bursae and tendons in shoulder region 09/19/2008  . GERD 02/25/2008  . Diabetes type 2, uncontrolled ([place]) 04/26/2006  . Hyperlipidemia 04/26/2006  . ANXIETY STATE NOS 04/26/2006  . Obstructive sleep apnea 04/26/2006  . MORBID OBESITY 01/11/2006  . Essential hypertension 01/11/2006  . ALLERGIC RHINITIS 01/11/2006  . DYSFUNCTIONAL UTERINE BLEEDING 01/11/2006    Past Surgical History:  Procedure Laterality Date  . CHOLECYSTECTOMY    . HERNIA REPAIR    . INCISION AND DRAINAGE ABSCESS N/A 06/01/2014   Procedure: INCISION AND DRAINAGE ABSCESS;  Surgeon: Rolm Bookbinder, MD;  Location: [facility];  Service: General;  Laterality: N/A;  . IR GENERIC HISTORICAL  05/18/2016   IR EPIDUROGRAPHY 05/18/2016 San Morelle, MD [facility]  . JOINT REPLACEMENT    . LAPAROSCOPIC GASTRIC SLEEVE RESECTION WITH HIATAL HERNIA REPAIR N/A 07/26/2016   Procedure: LAPAROSCOPIC GASTRIC SLEEVE RESECTION WITH HIATAL HERNIA REPAIR, UPPER ENDOSCOPY;  Surgeon: Johnathan Hausen, MD;  Location: [facility];  Service: General;  Laterality: N/A;  .  LITHOTRIPSY    . PULMONARY THROMBECTOMY N/A 03/19/2018   Procedure: PULMONARY THROMBECTOMY;  Surgeon: Algernon Huxley, MD;  Location: [facility];  Service: Cardiovascular;  Laterality: N/A;  . RADIOACTIVE SEED GUIDED EXCISIONAL BREAST BIOPSY Left 01/11/2019   Procedure: RADIOACTIVE SEED GUIDED OF 2 SITES EXCISIONAL LEFT BREAST BIOPSY;  Surgeon: Johnathan Hausen, MD;  Location: [facility];  Service: General;  Laterality: Left;  . REPLACEMENT TOTAL KNEE BILATERAL    . TUBAL LIGATION       OB History    Gravida  4   Para      Term      Preterm      AB  1   Living  2     SAB  1    IAB      Ectopic      Multiple      Live Births  3        Obstetric Comments  Patient had two twin pregnancies.  One Pregnancy resulted in miscarriage completely and second twin pregnancy resulted in one live birth.  She also had a son that was killed in the [employer].        Family History  Problem Relation Age of Onset  . Coronary artery disease Other   . Hypertension Other   . Diabetes Other   . Obesity Other   . Diabetes Mother   . Hypertension Mother   . Congestive Heart Failure Mother     Social History   Tobacco Use  . Smoking status: Never Smoker  . Smokeless tobacco: Never Used  Vaping Use  . Vaping Use: Never used  Substance Use Topics  . Alcohol use: Yes    Comment: occasional  . Drug use: No    Home Medications Prior to Admission medications   Medication Sig Start Date End Date Taking? Authorizing Provider  atorvastatin (LIPITOR) 20 MG tablet Take 20 mg by mouth every evening.    [provider]  benazepril (LOTENSIN) 40 MG tablet Take 40 mg by mouth every evening.  07/06/16   [provider]  Colchicine 0.6 MG CAPS 1 CAPSULE BY MOUTH 1-2 TIMES A DAY FOR 3 DAYS FOR GOUT FLARE AND 1 CAP DAILY FOR PROPHYLAXIS 03/10/20   Dickie La, MD  furosemide (LASIX) 20 MG tablet Take 20 mg by mouth every evening.  12/28/15   [provider]  glipiZIDE (GLUCOTROL) 5 MG tablet Take 5 mg by mouth every evening.  07/13/17   [provider]  loperamide (IMODIUM A-D) 2 MG tablet Take 4 mg by mouth 4 (four) times daily as needed for diarrhea or loose stools.    [provider]  metFORMIN (GLUCOPHAGE) 850 MG tablet Take 850 mg by mouth 2 (two) times daily.  07/13/17   [provider]  metoprolol tartrate (LOPRESSOR) 100 MG tablet Take 200 mg by mouth every evening.    [provider]  naloxone [facility]) 4 MG/0.1ML LIQD nasal spray kit Place 1 spray into the nose as needed (opioid overdose).    [provider]   nitroGLYCERIN (NITRODUR - DOSED IN MG/24 HR) 0.2 mg/hr patch Use 1/4 patch daily to the affected area. 01/15/19   Dickie La, MD  oxyCODONE (ROXICODONE) 15 MG immediate release tablet Take 1 tablet (15 mg total) by mouth every 6 (six) hours as needed for pain. Patient taking differently: Take 15 mg by mouth 5 (five) times daily.  03/22/18   Bettey Costa, MD  pantoprazole (PROTONIX) 40 MG tablet Take 40 mg by mouth every evening.     [provider]  Semaglutide, 1 MG/DOSE, (OZEMPIC, 1 MG/DOSE,) 2 MG/1.5ML SOPN Inject 1 mg into the skin every Monday.    [provider]  Vitamin D, Ergocalciferol, (DRISDOL) 1.25 MG (50000 UT) CAPS capsule Take 50,000 Units by mouth every Monday.    [provider]    Allergies    Propoxyphene n-acetaminophen  Review of Systems   Review of Systems  Constitutional: Negative for fever.  Gastrointestinal: Positive for nausea. Negative for abdominal pain and vomiting.  Genitourinary: Positive for flank pain. Negative for dysuria and hematuria.  Musculoskeletal: Negative for back pain.  Skin: Positive for color change.  All other systems reviewed and are negative.   Physical Exam Updated Vital Signs BP 128/79 (BP Location: Right Arm)   Pulse 67   Temp 97.7 F (36.5 C) (Oral)   Resp 20   Ht _0  (1.727 m)   Wt (!) 140.6 kg   LMP 04/11/2009   SpO2 95%   BMI 47.14 kg/m   Physical Exam Vitals and nursing note reviewed.  Constitutional:      Appearance: She is well-developed. She is obese.  HENT:     Head: Normocephalic and atraumatic.     Right Ear: External ear normal.     Left Ear: External ear normal.     Nose: Nose normal.  Eyes:     General:        Right eye: No discharge.        Left eye: No discharge.  Cardiovascular:     Rate and Rhythm: Normal rate and regular rhythm.     Heart sounds: Normal heart sounds.  Pulmonary:     Effort: Pulmonary effort is normal.     Breath sounds: Normal breath sounds.   Abdominal:     Palpations: Abdomen is soft.     Tenderness: There is no abdominal tenderness. There is no right CVA tenderness or left CVA tenderness.     Comments: No obvious rash. No focal tenderness. She points to her left lateral abd/flank as source of pain, about midway between ribs and pelvis  Musculoskeletal:       Legs:  Skin:    General: Skin is warm and dry.  Neurological:     Mental Status: She is alert.  Psychiatric:        Mood and Affect: Mood is not anxious.     ED Results / Procedures / Treatments   Labs (all labs ordered are listed, but only abnormal results are displayed) Labs Reviewed  CBC WITH DIFFERENTIAL/PLATELET - Abnormal; Notable for the following components:      Result Value   Hemoglobin 11.8 (*)    All other components within normal limits  COMPREHENSIVE METABOLIC PANEL - Abnormal; Notable for the following components:   Glucose, Bld 157 (*)    Calcium 8.8 (*)    Albumin 3.2 (*)    All other components within normal limits  URINALYSIS, ROUTINE W REFLEX MICROSCOPIC - Abnormal; Notable for the following components:   APPearance HAZY (*)    Specific Gravity, Urine >1.030 (*)    Leukocytes,Ua TRACE (*)    All other components within normal limits  URINALYSIS, MICROSCOPIC (REFLEX) - Abnormal; Notable for the following components:   Bacteria, UA FEW (*)    All other components within normal limits  LIPASE, BLOOD    EKG None  Radiology CT Renal Stone Study  Result Date: 07/03/2020 CLINICAL DATA:  Left-sided flank pain since yesterday. EXAM: CT ABDOMEN AND PELVIS WITHOUT CONTRAST TECHNIQUE: Multidetector CT imaging of the abdomen and pelvis was performed following the standard protocol without IV contrast. COMPARISON:  04/24/2020 FINDINGS: Lower chest: Hiatal hernia.  Minimal basilar pulmonary scarring. Hepatobiliary: Previous cholecystectomy. Liver parenchyma appears normal without contrast. Pancreas: Normal Spleen: Normal Adrenals/Urinary Tract:  Small chronic right adrenal adenoma is unchanged. Renal parenchyma is normal on both sides. 1 mm or smaller stone in the lower pole of the left kidney is unchanged. No hydroureteronephrosis. No sign of passing stone. No stone in the bladder. Stomach/Bowel: Previous gastric surgery. Small bowel is normal. Normal appendix. No significant: Finding. Vascular/Lymphatic: No significant aortic atherosclerotic calcification. IVC is normal. No retroperitoneal adenopathy. Reproductive: Normal.  No pelvic mass. Other: Tiny supraumbilical ventral hernia containing only a tiny amount of fat. Musculoskeletal: Fatty atrophy of the rectus abdominus muscle on the right. Chronic degenerative changes of the lumbar spine. Chronic sacroiliac osteoarthritis. IMPRESSION: 1. No acute finding to explain left flank pain. No evidence of passing stone or hydronephrosis. 1 mm or smaller stone in the lower pole of the left kidney is unchanged. 2. Hiatal hernia. Previous gastric surgery. 3. Previous cholecystectomy. 4. Small chronic right adrenal adenoma. 5. Tiny supraumbilical ventral hernia containing only a tiny amount of fat. 6. Fatty atrophy of the right rectus abdominus muscle. 7. Chronic degenerative changes of the lumbar spine and sacroiliac osteoarthritis. Electronically Signed   By: Nelson Chimes M.D.   On: 07/03/2020 10:16    Procedures Ultrasound ED Soft Tissue  Date/Time: 07/03/2020 9:24 AM Performed by: Sherwood Gambler, MD Authorized by: Sherwood Gambler, MD   Procedure details:    Indications: localization of abscess     Transverse view:  Visualized   Longitudinal view:  Visualized   Images: archived   Location:    Location: lower extremity     Side:  Left Findings:     abscess present  .Marland KitchenIncision and Drainage  Date/Time: 07/03/2020 10:15 AM Performed by: Sherwood Gambler, MD Authorized by: Sherwood Gambler, MD   Consent:    Consent obtained:  Verbal   Consent given by:  Patient Universal protocol:     Patient identity confirmed:  Verbally with patient Location:    Type:  Abscess   Size:  3 cm x 3 cm   Location:  Lower extremity   Lower extremity location:  Leg   Leg location:  L upper leg Pre-procedure details:    Skin preparation:  Povidone-iodine Anesthesia:    Anesthesia method:  Local infiltration   Local anesthetic:  Lidocaine 2% WITH epi Procedure type:    Complexity:  Simple Procedure details:    Incision types:  Elliptical   Incision depth:  Dermal   Wound management:  Probed and deloculated   Drainage:  Purulent   Drainage amount:  Moderate   Wound treatment:  Wound left open   Packing materials:  None Post-procedure details:    Procedure completion:  Tolerated well, no immediate complications     Medications Ordered in ED Medications  oxyCODONE-acetaminophen (PERCOCET/ROXICET) 5-325 MG per tablet 2 tablet (has no administration in time range)  HYDROmorphone (DILAUDID) injection 1 mg (1 mg Intravenous Given 07/03/20 0915)  lidocaine-EPINEPHrine (XYLOCAINE W/EPI) 2 %-1:200000 (PF) injection 10 mL (10 mLs Intradermal Given by Other 07/03/20 0937)  ketorolac (TORADOL) 15 MG/ML injection 15 mg (15 mg Intravenous Given 07/03/20 1036)    ED Course  I have reviewed the triage vital  signs and the nursing notes.  Pertinent labs & imaging results that were available during my care of the patient were reviewed by me and considered in my medical decision making (see chart for details).    MDM Rules/Calculators/A&P                          Bedside ultrasound shows a superficial abscess which was incised and drained.  Patient was otherwise given IV pain control for this flank pain.  It seems well below her chest/ribs and so my suspicion of chest pathology mimicking this is pretty low.  Chart review also shows she has come in multiple times for flank pain, occasionally has a kidney stone but often no causes found.  Her labs are unremarkable including normal WBC, renal function,  no UTI.  At this point, with a negative CT, I do not think further work-up or treatment is indicated.  Her vital signs are stable.  She appears stable for discharge home. Final Clinical Impression(s) / ED Diagnoses Final diagnoses:  Left flank pain  Abscess of left thigh    Rx / DC Orders ED Discharge Orders    None       Sherwood Gambler, MD 07/03/20 1105

## 2020-07-03 NOTE — Discharge Instructions (Addendum)
If you develop worsening, continued, or recurrent abdominal pain, uncontrolled vomiting, fever, chest or back pain, or any other new/concerning symptoms then return to the ER for evaluation.   Continue to take the Bactrim until completed for your abscess.

## 2020-07-06 ENCOUNTER — Other Ambulatory Visit: Payer: Self-pay | Admitting: Family Medicine

## 2020-07-06 DIAGNOSIS — M109 Gout, unspecified: Secondary | ICD-10-CM

## 2020-07-07 ENCOUNTER — Ambulatory Visit: Payer: Self-pay | Admitting: Urology

## 2020-07-12 DIAGNOSIS — M25552 Pain in left hip: Secondary | ICD-10-CM | POA: Diagnosis not present

## 2020-07-12 DIAGNOSIS — E119 Type 2 diabetes mellitus without complications: Secondary | ICD-10-CM | POA: Diagnosis not present

## 2020-07-12 DIAGNOSIS — M1991 Primary osteoarthritis, unspecified site: Secondary | ICD-10-CM | POA: Diagnosis not present

## 2020-07-12 DIAGNOSIS — Z79899 Other long term (current) drug therapy: Secondary | ICD-10-CM | POA: Diagnosis not present

## 2020-07-12 DIAGNOSIS — I10 Essential (primary) hypertension: Secondary | ICD-10-CM | POA: Diagnosis not present

## 2020-07-12 DIAGNOSIS — Z794 Long term (current) use of insulin: Secondary | ICD-10-CM | POA: Diagnosis not present

## 2020-07-12 DIAGNOSIS — M1990 Unspecified osteoarthritis, unspecified site: Secondary | ICD-10-CM | POA: Diagnosis not present

## 2020-07-12 DIAGNOSIS — L02214 Cutaneous abscess of groin: Secondary | ICD-10-CM | POA: Diagnosis not present

## 2020-07-12 DIAGNOSIS — M47816 Spondylosis without myelopathy or radiculopathy, lumbar region: Secondary | ICD-10-CM | POA: Diagnosis not present

## 2020-07-12 DIAGNOSIS — M25551 Pain in right hip: Secondary | ICD-10-CM | POA: Diagnosis not present

## 2020-07-12 DIAGNOSIS — G5712 Meralgia paresthetica, left lower limb: Secondary | ICD-10-CM | POA: Diagnosis not present

## 2020-07-13 ENCOUNTER — Other Ambulatory Visit: Payer: Self-pay

## 2020-07-13 ENCOUNTER — Ambulatory Visit: Payer: Self-pay

## 2020-07-13 ENCOUNTER — Ambulatory Visit (INDEPENDENT_AMBULATORY_CARE_PROVIDER_SITE_OTHER): Payer: PPO | Admitting: Family Medicine

## 2020-07-13 ENCOUNTER — Encounter: Payer: Self-pay | Admitting: Family Medicine

## 2020-07-13 VITALS — BP 134/80 | Ht 68.0 in | Wt 285.0 lb

## 2020-07-13 DIAGNOSIS — M545 Low back pain, unspecified: Secondary | ICD-10-CM | POA: Diagnosis not present

## 2020-07-13 DIAGNOSIS — M79605 Pain in left leg: Secondary | ICD-10-CM

## 2020-07-13 DIAGNOSIS — M25552 Pain in left hip: Secondary | ICD-10-CM

## 2020-07-13 MED ORDER — PREDNISONE 10 MG PO TABS: ORAL_TABLET | ORAL | 0 refills | Status: DC

## 2020-07-13 MED ORDER — METHYLPREDNISOLONE ACETATE 80 MG/ML IJ SUSP
80.0000 mg | Freq: Once | INTRAMUSCULAR | Status: AC
  Administered 2020-07-13: 80 mg via INTRAMUSCULAR

## 2020-07-13 MED ORDER — KETOROLAC TROMETHAMINE 60 MG/2ML IM SOLN
60.0000 mg | Freq: Once | INTRAMUSCULAR | Status: AC
  Administered 2020-07-13: 60 mg via INTRAMUSCULAR

## 2020-07-13 NOTE — Patient Instructions (Addendum)
Your pain is consistent with an irritated nerve wrapping into this leg. Take tylenol for baseline pain relief (1-2 extra strength tabs 3x/day) Take prednisone dose pack as directed. Ok to continue with the gabapentin and the robaxin in addition to these. Continue your oxycodone. Stay as active as possible. Physical therapy has been shown to be helpful as well. Strengthening of low back muscles, abdominal musculature are key for long term pain relief. If not improving, will consider further imaging (MRI). Let us know Thursday how you're doing - would go ahead with MRI of lumbar spine if not improving.

## 2020-07-13 NOTE — Progress Notes (Signed)
PCP: Simona Huh, NP  Subjective:   HPI: Patient is a 60 y.o. female here for left hip/back pain.  Patient reports almost 2 weeks of lateral low back, lateral left hip pain into the groin. No obvious injury. She went to ED on 3/25 and yesterday for evaluation. Had CT of abdomen and pelvis (renal stone study) and x-rays of hips - told pain due to arthritic changes but otherwise no acute findings. She has been taking gabapentin and robaxin. No radiation down the leg. No numbness or tingling. Worse with weightbearing and having trouble sleeping. Takes oxycodone as well. No bowel/bladder dysfunction - in ED reported nausea. At initial 3/25 visit she had an abscess left thigh and had I&D. Denies fevers, chills, sweats.  Past Medical History:  Diagnosis Date  . Allergic rhinitis   . Anemia    1980's  . Anxiety   . Carpal tunnel syndrome, left    pt denies  . Depression   . DUB (dysfunctional uterine bleeding)   . Dysrhythmia    tachycardic  . External hemorrhoid   . GERD (gastroesophageal reflux disease)   . Gout   . History of hiatal hernia   . History of kidney stones   . Hypertension   . Morbid obesity ([place])   . Multiple thyroid nodules   . Obesity   . Osteoarthritis, knee   . Pneumonia   . Recurrent UTI   . Rotator cuff syndrome of left shoulder   . Type II diabetes mellitus ([place])     Current Outpatient Medications on File Prior to Visit  Medication Sig Dispense Refill  . atorvastatin (LIPITOR) 20 MG tablet Take 20 mg by mouth every evening.    . benazepril (LOTENSIN) 40 MG tablet Take 40 mg by mouth every evening.     . Colchicine 0.6 MG CAPS 1 CAPSULE BY MOUTH 1-2 TIMES A DAY FOR 3 DAYS FOR GOUT FLARE AND 1 CAP DAILY FOR PROPHYLAXIS 45 capsule 1  . furosemide (LASIX) 20 MG tablet Take 20 mg by mouth every evening.     Marland Kitchen glipiZIDE (GLUCOTROL) 5 MG tablet Take 5 mg by mouth every evening.   5  . loperamide (IMODIUM A-D) 2 MG tablet Take 4 mg by mouth 4 (four)  times daily as needed for diarrhea or loose stools.    . metFORMIN (GLUCOPHAGE) 850 MG tablet Take 850 mg by mouth 2 (two) times daily.   5  . metoprolol tartrate (LOPRESSOR) 100 MG tablet Take 200 mg by mouth every evening.    . naloxone (NARCAN) 4 MG/0.1ML LIQD nasal spray kit Place 1 spray into the nose as needed (opioid overdose).    . nitroGLYCERIN (NITRODUR - DOSED IN MG/24 HR) 0.2 mg/hr patch Use 1/4 patch daily to the affected area. 30 patch 1  . oxyCODONE (ROXICODONE) 15 MG immediate release tablet Take 1 tablet (15 mg total) by mouth every 6 (six) hours as needed for pain. (Patient taking differently: Take 15 mg by mouth 5 (five) times daily. ) 15 tablet 0  . pantoprazole (PROTONIX) 40 MG tablet Take 40 mg by mouth every evening.     . Semaglutide, 1 MG/DOSE, (OZEMPIC, 1 MG/DOSE,) 2 MG/1.5ML SOPN Inject 1 mg into the skin every Monday.    . Vitamin D, Ergocalciferol, (DRISDOL) 1.25 MG (50000 UT) CAPS capsule Take 50,000 Units by mouth every Monday.     No current facility-administered medications on file prior to visit.    Past Surgical History:  Procedure Laterality  Date  . CHOLECYSTECTOMY    . HERNIA REPAIR    . INCISION AND DRAINAGE ABSCESS N/A 06/01/2014   Procedure: INCISION AND DRAINAGE ABSCESS;  Surgeon: Rolm Bookbinder, MD;  Location: [facility];  Service: General;  Laterality: N/A;  . IR GENERIC HISTORICAL  05/18/2016   IR EPIDUROGRAPHY 05/18/2016 [place] Morelle, MD [facility]  . JOINT REPLACEMENT    . LAPAROSCOPIC GASTRIC SLEEVE RESECTION WITH HIATAL HERNIA REPAIR N/A 07/26/2016   Procedure: LAPAROSCOPIC GASTRIC SLEEVE RESECTION WITH HIATAL HERNIA REPAIR, UPPER ENDOSCOPY;  Surgeon: Johnathan Hausen, MD;  Location[facility];  Service: General;  Laterality: N/A;  . LITHOTRIPSY    . PULMONARY THROMBECTOMY N/A 03/19/2018   Procedure: PULMONARY THROMBECTOMY;  Surgeon: Algernon Huxley, MD;  Location: [place] [facility];  Service: Cardiovascular;  Laterality: N/A;  . RADIOACTIVE SEED  GUIDED EXCISIONAL BREAST BIOPSY Left 01/11/2019   Procedure: RADIOACTIVE SEED GUIDED OF 2 SITES EXCISIONAL LEFT BREAST BIOPSY;  Surgeon: Johnathan Hausen, MD;  Location: [place] [facility];  Service: General;  Laterality: Left;  . REPLACEMENT TOTAL KNEE BILATERAL    . TUBAL LIGATION      Allergies  Allergen Reactions  . Propoxyphene N-Acetaminophen Palpitations    Social History   Socioeconomic History  . Marital status: Divorced    Spouse name: Not on file  . Number of children: Not on file  . Years of education: Not on file  . Highest education level: Not on file  Occupational History  . Not on file  Tobacco Use  . Smoking status: Never Smoker  . Smokeless tobacco: Never Used  Vaping Use  . Vaping Use: Never used  Substance and Sexual Activity  . Alcohol use: Yes    Comment: occasional  . Drug use: No  . Sexual activity: Yes    Partners: Male    Birth control/protection: Surgical    Comment: tubal ligation age 89  Other Topics Concern  . Not on file  Social History Narrative   She has a son who was killed in the [employer].  Her adopted mother recently died of pancreatic cancer.    Social Determinants of Health   Financial Resource Strain: Not on file  Food Insecurity: Not on file  Transportation Needs: Not on file  Physical Activity: Not on file  Stress: Not on file  Social Connections: Not on file  Intimate Partner Violence: Not on file    Family History  Problem Relation Age of Onset  . Coronary artery disease Other   . Hypertension Other   . Diabetes Other   . Obesity Other   . Diabetes Mother   . Hypertension Mother   . Congestive Heart Failure Mother     BP 134/80   Ht $R'5\' 8"'gs$  (1.727 m)   Wt 285 lb (129.3 kg)   LMP 04/11/2009   BMI 43.33 kg/m   No flowsheet data found.  No flowsheet data found.  Review of Systems: See HPI above.     Objective:  Physical Exam:  Gen: NAD, comfortable in exam room  Back/left hip: No gross deformity, scoliosis. TTP  left gluteal region, lateral and anterior hip.  No midline or bony TTP. FROM - pain with ER of hip felt lateral hip and in groin. Strength LEs 5/5 all muscle groups.   2+ MSRs in patellar and achilles tendons, equal bilaterally. Negative SLRs. Sensation intact to light touch bilaterally. Pain with logroll  Limited MSK u/s left hip: No joint effusion.  No neovascularity surrounding hip  capsule.  Area of abscess shows no evidence reaccumulation.   Assessment & Plan:  1. Back/left hip pain - Ultrasound was reassuring against effusion or intraarticular pathology as cause for this.  No systemic symptoms currently and no evidence abscess of thigh has reaccumulated.  Likely due to radicular pain.  IM toradol with depomedrol.  Oral prednisone dose pack provided as well.  Continue her oxycodone, gabapentin, robaxin.  Let us know how she's doing near end of week - would recommend MRI of lumbar spine if not improving.

## 2020-07-14 ENCOUNTER — Other Ambulatory Visit: Payer: Self-pay

## 2020-07-14 MED ORDER — GABAPENTIN 300 MG PO CAPS: 300.0000 mg | ORAL_CAPSULE | Freq: Three times a day (TID) | ORAL | 2 refills | Status: DC

## 2020-07-15 ENCOUNTER — Other Ambulatory Visit: Payer: Self-pay

## 2020-07-15 DIAGNOSIS — M25552 Pain in left hip: Secondary | ICD-10-CM

## 2020-07-15 DIAGNOSIS — M79605 Pain in left leg: Secondary | ICD-10-CM

## 2020-07-15 DIAGNOSIS — M545 Low back pain, unspecified: Secondary | ICD-10-CM

## 2020-07-16 NOTE — Progress Notes (Signed)
Pt is still in a lot of pain and feels like she is getting worse. She Is requesting getting an MRI of L hip and low back.   Orders placed. Pt will call [facility] to schedule.

## 2020-07-16 NOTE — Addendum Note (Signed)
Addended by: Jolinda Croak E on: 07/16/2020 08:39 AM   Modules accepted: Orders

## 2020-07-17 ENCOUNTER — Other Ambulatory Visit: Payer: Self-pay

## 2020-07-20 MED ORDER — DIAZEPAM 5 MG PO TABS: ORAL_TABLET | ORAL | 0 refills | Status: DC

## 2020-07-20 MED ORDER — METHOCARBAMOL 500 MG PO TABS: 500.0000 mg | ORAL_TABLET | Freq: Two times a day (BID) | ORAL | 0 refills | Status: DC | PRN

## 2020-07-20 NOTE — Addendum Note (Signed)
Addended by: Jolinda Croak E on: 07/20/2020 10:43 AM   Modules accepted: Orders

## 2020-07-20 NOTE — Addendum Note (Signed)
Addended by: Jolinda Croak E on: 07/20/2020 09:40 AM   Modules accepted: Orders

## 2020-07-20 NOTE — Progress Notes (Signed)
Pt called asking for Valium for her 2 MRI appts for claustrophobia. Also requesting refill on Robaxin 500 mg that she got at the hospital.  Rx sent to pharmacy.

## 2020-07-22 ENCOUNTER — Ambulatory Visit
Admission: RE | Admit: 2020-07-22 | Discharge: 2020-07-22 | Disposition: A | Discharge status: Home / Self Care | Payer: PPO | Source: Ambulatory Visit | Attending: Family Medicine | Admitting: Family Medicine

## 2020-07-22 DIAGNOSIS — M1612 Unilateral primary osteoarthritis, left hip: Secondary | ICD-10-CM | POA: Diagnosis not present

## 2020-07-22 DIAGNOSIS — M79605 Pain in left leg: Secondary | ICD-10-CM

## 2020-07-22 DIAGNOSIS — M48061 Spinal stenosis, lumbar region without neurogenic claudication: Secondary | ICD-10-CM | POA: Diagnosis not present

## 2020-07-22 DIAGNOSIS — M25552 Pain in left hip: Secondary | ICD-10-CM

## 2020-07-22 DIAGNOSIS — M533 Sacrococcygeal disorders, not elsewhere classified: Secondary | ICD-10-CM | POA: Diagnosis not present

## 2020-07-22 DIAGNOSIS — R600 Localized edema: Secondary | ICD-10-CM | POA: Diagnosis not present

## 2020-07-22 DIAGNOSIS — M545 Low back pain, unspecified: Secondary | ICD-10-CM | POA: Diagnosis not present

## 2020-07-23 DIAGNOSIS — Z6841 Body Mass Index (BMI) 40.0 and over, adult: Secondary | ICD-10-CM | POA: Diagnosis not present

## 2020-07-23 DIAGNOSIS — E119 Type 2 diabetes mellitus without complications: Secondary | ICD-10-CM | POA: Diagnosis not present

## 2020-07-23 DIAGNOSIS — N951 Menopausal and female climacteric states: Secondary | ICD-10-CM | POA: Diagnosis not present

## 2020-07-23 DIAGNOSIS — E559 Vitamin D deficiency, unspecified: Secondary | ICD-10-CM | POA: Diagnosis not present

## 2020-07-23 DIAGNOSIS — M129 Arthropathy, unspecified: Secondary | ICD-10-CM | POA: Diagnosis not present

## 2020-07-23 DIAGNOSIS — D539 Nutritional anemia, unspecified: Secondary | ICD-10-CM | POA: Diagnosis not present

## 2020-07-23 DIAGNOSIS — K219 Gastro-esophageal reflux disease without esophagitis: Secondary | ICD-10-CM | POA: Diagnosis not present

## 2020-07-23 DIAGNOSIS — J309 Allergic rhinitis, unspecified: Secondary | ICD-10-CM | POA: Diagnosis not present

## 2020-07-23 DIAGNOSIS — I10 Essential (primary) hypertension: Secondary | ICD-10-CM | POA: Diagnosis not present

## 2020-07-23 DIAGNOSIS — E7800 Pure hypercholesterolemia, unspecified: Secondary | ICD-10-CM | POA: Diagnosis not present

## 2020-07-23 DIAGNOSIS — R5383 Other fatigue: Secondary | ICD-10-CM | POA: Diagnosis not present

## 2020-07-23 DIAGNOSIS — Z1159 Encounter for screening for other viral diseases: Secondary | ICD-10-CM | POA: Diagnosis not present

## 2020-07-23 DIAGNOSIS — Z79899 Other long term (current) drug therapy: Secondary | ICD-10-CM | POA: Diagnosis not present

## 2020-07-23 DIAGNOSIS — R600 Localized edema: Secondary | ICD-10-CM | POA: Diagnosis not present

## 2020-07-23 DIAGNOSIS — Z20822 Contact with and (suspected) exposure to covid-19: Secondary | ICD-10-CM | POA: Diagnosis not present

## 2020-07-25 ENCOUNTER — Other Ambulatory Visit: Payer: PPO

## 2020-07-26 ENCOUNTER — Other Ambulatory Visit: Payer: PPO

## 2020-07-27 ENCOUNTER — Other Ambulatory Visit: Payer: Self-pay | Admitting: Sports Medicine

## 2020-07-27 ENCOUNTER — Encounter: Payer: Self-pay | Admitting: Sports Medicine

## 2020-07-27 ENCOUNTER — Other Ambulatory Visit: Payer: Self-pay

## 2020-07-27 ENCOUNTER — Ambulatory Visit (INDEPENDENT_AMBULATORY_CARE_PROVIDER_SITE_OTHER): Payer: PPO | Admitting: Sports Medicine

## 2020-07-27 VITALS — BP 140/86 | Ht 68.0 in | Wt 295.0 lb

## 2020-07-27 DIAGNOSIS — M79605 Pain in left leg: Secondary | ICD-10-CM

## 2020-07-27 DIAGNOSIS — H524 Presbyopia: Secondary | ICD-10-CM | POA: Diagnosis not present

## 2020-07-27 DIAGNOSIS — M545 Low back pain, unspecified: Secondary | ICD-10-CM

## 2020-07-27 DIAGNOSIS — Z794 Long term (current) use of insulin: Secondary | ICD-10-CM | POA: Diagnosis not present

## 2020-07-27 DIAGNOSIS — H5203 Hypermetropia, bilateral: Secondary | ICD-10-CM | POA: Diagnosis not present

## 2020-07-27 DIAGNOSIS — E119 Type 2 diabetes mellitus without complications: Secondary | ICD-10-CM | POA: Diagnosis not present

## 2020-07-27 DIAGNOSIS — H52223 Regular astigmatism, bilateral: Secondary | ICD-10-CM | POA: Diagnosis not present

## 2020-07-27 NOTE — Patient Instructions (Signed)
Call Juliann Pulse at [facility] to schedule your injection at [phone]

## 2020-07-27 NOTE — Progress Notes (Signed)
Patient ID: Maria [name], female   DOB: Jan 04, 1961, 60 y.o.   MRN: [id number]  Maria [name] comes in today to discuss MRI findings of both the left hip and lumbar spine.  MRI of the left hip shows moderate left hip osteoarthritis as well as some moderate arthropathy of the pubic symphysis.  MRI of her lumbar spine shows multilevel lumbar spondylosis most pronounced at L4-L5 where there is moderate left foraminal stenosis.  Patient localizes her pain to the posterior left hip with occasional radiating pain down the leg.  It is constantly present.  Therefore, I suspect that the pain she is experiencing is originating from her lumbar spine.  I will order a diagnostic/therapeutic lumbar ESI at [facility].  I have asked the patient to follow-up with me 2 weeks after the injection for check on her progress.  In the meantime, she will continue on her current pain medications and muscle relaxers.  She is having difficulty getting a prescription for Lidoderm patches due to problems with preauthorization so I recommend that she simply purchase these over-the-counter.

## 2020-07-28 ENCOUNTER — Other Ambulatory Visit: Payer: Self-pay

## 2020-07-28 ENCOUNTER — Ambulatory Visit
Admission: RE | Admit: 2020-07-28 | Discharge: 2020-07-28 | Disposition: A | Discharge status: Home / Self Care | Payer: PPO | Source: Ambulatory Visit | Attending: Sports Medicine | Admitting: Sports Medicine

## 2020-07-28 ENCOUNTER — Ambulatory Visit: Payer: PPO | Admitting: Sports Medicine

## 2020-07-28 DIAGNOSIS — M79605 Pain in left leg: Secondary | ICD-10-CM

## 2020-07-28 DIAGNOSIS — M47816 Spondylosis without myelopathy or radiculopathy, lumbar region: Secondary | ICD-10-CM | POA: Diagnosis not present

## 2020-07-28 MED ORDER — METHYLPREDNISOLONE ACETATE 40 MG/ML INJ SUSP (RADIOLOG
120.0000 mg | Freq: Once | INTRAMUSCULAR | Status: AC
  Administered 2020-07-28: 80 mg via EPIDURAL

## 2020-07-28 MED ORDER — IOPAMIDOL (ISOVUE-M 200) INJECTION 41%
1.0000 mL | Freq: Once | INTRAMUSCULAR | Status: AC
  Administered 2020-07-28: 1 mL via EPIDURAL

## 2020-07-28 NOTE — Discharge Instructions (Signed)

## 2020-08-03 DIAGNOSIS — M109 Gout, unspecified: Secondary | ICD-10-CM | POA: Diagnosis not present

## 2020-08-03 DIAGNOSIS — E119 Type 2 diabetes mellitus without complications: Secondary | ICD-10-CM | POA: Diagnosis not present

## 2020-08-03 DIAGNOSIS — M545 Low back pain, unspecified: Secondary | ICD-10-CM | POA: Diagnosis not present

## 2020-08-03 DIAGNOSIS — Z79899 Other long term (current) drug therapy: Secondary | ICD-10-CM | POA: Diagnosis not present

## 2020-08-03 DIAGNOSIS — G8929 Other chronic pain: Secondary | ICD-10-CM | POA: Diagnosis not present

## 2020-08-03 DIAGNOSIS — R768 Other specified abnormal immunological findings in serum: Secondary | ICD-10-CM | POA: Diagnosis not present

## 2020-08-31 DIAGNOSIS — M109 Gout, unspecified: Secondary | ICD-10-CM | POA: Diagnosis not present

## 2020-08-31 DIAGNOSIS — Z79899 Other long term (current) drug therapy: Secondary | ICD-10-CM | POA: Diagnosis not present

## 2020-08-31 DIAGNOSIS — M545 Low back pain, unspecified: Secondary | ICD-10-CM | POA: Diagnosis not present

## 2020-08-31 DIAGNOSIS — R768 Other specified abnormal immunological findings in serum: Secondary | ICD-10-CM | POA: Diagnosis not present

## 2020-08-31 DIAGNOSIS — G8929 Other chronic pain: Secondary | ICD-10-CM | POA: Diagnosis not present

## 2020-09-12 ENCOUNTER — Inpatient Hospital Stay: Admission: RE | Admit: 2020-09-12 | Payer: PPO | Source: Ambulatory Visit

## 2020-09-22 ENCOUNTER — Other Ambulatory Visit: Payer: Self-pay | Admitting: Sports Medicine

## 2020-09-22 ENCOUNTER — Other Ambulatory Visit: Payer: Self-pay

## 2020-09-22 DIAGNOSIS — M79605 Pain in left leg: Secondary | ICD-10-CM

## 2020-09-22 DIAGNOSIS — M545 Low back pain, unspecified: Secondary | ICD-10-CM

## 2020-09-22 NOTE — Progress Notes (Signed)
Pt called asking for order for repeat lumbar ESI. States she had pretty good relief with the first injection.

## 2020-09-24 ENCOUNTER — Inpatient Hospital Stay: Admission: RE | Admit: 2020-09-24 | Payer: PPO | Source: Ambulatory Visit

## 2020-09-24 ENCOUNTER — Other Ambulatory Visit: Payer: PPO

## 2020-09-29 ENCOUNTER — Other Ambulatory Visit: Payer: Self-pay

## 2020-09-29 ENCOUNTER — Ambulatory Visit
Admission: RE | Admit: 2020-09-29 | Discharge: 2020-09-29 | Disposition: A | Discharge status: Home / Self Care | Payer: PPO | Source: Ambulatory Visit | Attending: Sports Medicine | Admitting: Sports Medicine

## 2020-09-29 DIAGNOSIS — G8929 Other chronic pain: Secondary | ICD-10-CM | POA: Diagnosis not present

## 2020-09-29 DIAGNOSIS — Z79899 Other long term (current) drug therapy: Secondary | ICD-10-CM | POA: Diagnosis not present

## 2020-09-29 DIAGNOSIS — R768 Other specified abnormal immunological findings in serum: Secondary | ICD-10-CM | POA: Diagnosis not present

## 2020-09-29 DIAGNOSIS — E119 Type 2 diabetes mellitus without complications: Secondary | ICD-10-CM | POA: Diagnosis not present

## 2020-09-29 DIAGNOSIS — M47817 Spondylosis without myelopathy or radiculopathy, lumbosacral region: Secondary | ICD-10-CM | POA: Diagnosis not present

## 2020-09-29 DIAGNOSIS — M79605 Pain in left leg: Secondary | ICD-10-CM

## 2020-09-29 DIAGNOSIS — M109 Gout, unspecified: Secondary | ICD-10-CM | POA: Diagnosis not present

## 2020-09-29 DIAGNOSIS — E538 Deficiency of other specified B group vitamins: Secondary | ICD-10-CM | POA: Diagnosis not present

## 2020-09-29 DIAGNOSIS — M545 Low back pain, unspecified: Secondary | ICD-10-CM | POA: Diagnosis not present

## 2020-09-29 MED ORDER — METHYLPREDNISOLONE ACETATE 40 MG/ML INJ SUSP (RADIOLOG
80.0000 mg | Freq: Once | INTRAMUSCULAR | Status: AC
  Administered 2020-09-29: 80 mg via EPIDURAL

## 2020-09-29 MED ORDER — IOPAMIDOL (ISOVUE-M 200) INJECTION 41%
1.0000 mL | Freq: Once | INTRAMUSCULAR | Status: AC
  Administered 2020-09-29: 1 mL via EPIDURAL

## 2020-09-29 NOTE — Discharge Instructions (Signed)
Post Procedure Spinal Discharge Instruction Sheet  You may resume a regular diet and any medications that you routinely take (including pain medications) unless otherwise noted by MD.  No driving day of procedure.  Light activity throughout the rest of the day.  Do not do any strenuous work, exercise, bending or lifting.  The day following the procedure, you can resume normal physical activity but you should refrain from exercising or physical therapy for at least three days thereafter.  You may apply ice to the injection site, 20 minutes on, 20 minutes off, as needed. Do not apply ice directly to skin.    Common Side Effects:  Headaches- take your usual medications as directed by your physician.  Increase your fluid intake.  Caffeinated beverages may be helpful.  Lie flat in bed until your headache resolves.  Restlessness or inability to sleep- you may have trouble sleeping for the next few days.  Ask your referring physician if you need any medication for sleep.  Facial flushing or redness- should subside within a few days.  Increased pain- a temporary increase in pain a day or two following your procedure is not unusual.  Take your pain medication as prescribed by your referring physician.  Leg cramps  Please contact our office at [phone] for the following symptoms: Fever greater than 100 degrees. Headaches unresolved with medication after 2-3 days. Increased swelling, pain, or redness at injection site.   Thank you for visiting [facility] today.    YOU MAY RESUME YOUR ASPIRIN ANYTIME AFTER INJECTION TODAY 09/29/20.

## 2020-10-01 DIAGNOSIS — Z79899 Other long term (current) drug therapy: Secondary | ICD-10-CM | POA: Diagnosis not present

## 2020-10-01 DIAGNOSIS — M7989 Other specified soft tissue disorders: Secondary | ICD-10-CM | POA: Diagnosis not present

## 2020-10-13 ENCOUNTER — Ambulatory Visit: Payer: PPO | Admitting: Sports Medicine

## 2020-10-15 DIAGNOSIS — R0602 Shortness of breath: Secondary | ICD-10-CM | POA: Diagnosis not present

## 2020-10-16 ENCOUNTER — Other Ambulatory Visit: Payer: Self-pay

## 2020-10-16 ENCOUNTER — Encounter (HOSPITAL_COMMUNITY): Payer: Self-pay

## 2020-10-16 ENCOUNTER — Emergency Department (HOSPITAL_COMMUNITY)
Admission: EM | Admit: 2020-10-16 | Discharge: 2020-10-17 | Disposition: A | Discharge status: Home / Self Care | Payer: PPO | Attending: Emergency Medicine | Admitting: Emergency Medicine

## 2020-10-16 DIAGNOSIS — E785 Hyperlipidemia, unspecified: Secondary | ICD-10-CM | POA: Insufficient documentation

## 2020-10-16 DIAGNOSIS — N390 Urinary tract infection, site not specified: Secondary | ICD-10-CM | POA: Diagnosis not present

## 2020-10-16 DIAGNOSIS — R2242 Localized swelling, mass and lump, left lower limb: Secondary | ICD-10-CM | POA: Insufficient documentation

## 2020-10-16 DIAGNOSIS — M7918 Myalgia, other site: Secondary | ICD-10-CM

## 2020-10-16 DIAGNOSIS — I10 Essential (primary) hypertension: Secondary | ICD-10-CM | POA: Diagnosis not present

## 2020-10-16 DIAGNOSIS — Z79899 Other long term (current) drug therapy: Secondary | ICD-10-CM | POA: Diagnosis not present

## 2020-10-16 DIAGNOSIS — Z96653 Presence of artificial knee joint, bilateral: Secondary | ICD-10-CM | POA: Diagnosis not present

## 2020-10-16 DIAGNOSIS — Z7984 Long term (current) use of oral hypoglycemic drugs: Secondary | ICD-10-CM | POA: Insufficient documentation

## 2020-10-16 DIAGNOSIS — M7989 Other specified soft tissue disorders: Secondary | ICD-10-CM

## 2020-10-16 DIAGNOSIS — M799 Soft tissue disorder, unspecified: Secondary | ICD-10-CM | POA: Diagnosis not present

## 2020-10-16 DIAGNOSIS — E1169 Type 2 diabetes mellitus with other specified complication: Secondary | ICD-10-CM | POA: Insufficient documentation

## 2020-10-16 LAB — COMPREHENSIVE METABOLIC PANEL
ALT: 15 U/L (ref 0–44)
AST: 14 U/L — ABNORMAL LOW (ref 15–41)
Albumin: 3.6 g/dL (ref 3.5–5.0)
Alkaline Phosphatase: 108 U/L (ref 38–126)
Anion gap: 7 (ref 5–15)
BUN: 24 mg/dL — ABNORMAL HIGH (ref 8–23)
CO2: 27 mmol/L (ref 22–32)
Calcium: 9 mg/dL (ref 8.9–10.3)
Chloride: 107 mmol/L (ref 98–111)
Creatinine, Ser: 1.12 mg/dL — ABNORMAL HIGH (ref 0.44–1.00)
GFR, Estimated: 56 mL/min — ABNORMAL LOW (ref >60)
Glucose, Bld: 314 mg/dL — ABNORMAL HIGH (ref 70–99)
Potassium: 4.1 mmol/L (ref 3.5–5.1)
Sodium: 141 mmol/L (ref 135–145)
Total Bilirubin: 0.5 mg/dL (ref 0.3–1.2)
Total Protein: 7.2 g/dL (ref 6.5–8.1)

## 2020-10-16 LAB — CBC WITH DIFFERENTIAL/PLATELET
Abs Immature Granulocytes: 0.04 10*3/uL (ref 0.00–0.07)
Basophils Absolute: 0.1 10*3/uL (ref 0.0–0.1)
Basophils Relative: 1 %
Eosinophils Absolute: 0.1 10*3/uL (ref 0.0–0.5)
Eosinophils Relative: 2 %
HCT: 39.6 % (ref 36.0–46.0)
Hemoglobin: 12.4 g/dL (ref 12.0–15.0)
Immature Granulocytes: 1 %
Lymphocytes Relative: 48 %
Lymphs Abs: 3.7 10*3/uL (ref 0.7–4.0)
MCH: 28.8 pg (ref 26.0–34.0)
MCHC: 31.3 g/dL (ref 30.0–36.0)
MCV: 92.1 fL (ref 80.0–100.0)
Monocytes Absolute: 0.5 10*3/uL (ref 0.1–1.0)
Monocytes Relative: 7 %
Neutro Abs: 3 10*3/uL (ref 1.7–7.7)
Neutrophils Relative %: 41 %
Platelets: 324 10*3/uL (ref 150–400)
RBC: 4.3 MIL/uL (ref 3.87–5.11)
RDW: 14.9 % (ref 11.5–15.5)
WBC: 7.4 10*3/uL (ref 4.0–10.5)
nRBC: 0 % (ref 0.0–0.2)

## 2020-10-16 MED ORDER — OXYCODONE-ACETAMINOPHEN 5-325 MG PO TABS
1.0000 | ORAL_TABLET | Freq: Once | ORAL | Status: AC
  Administered 2020-10-16: 1 via ORAL
  Filled 2020-10-16: qty 1

## 2020-10-16 NOTE — ED Provider Notes (Signed)
[place] DEPT Provider Note   CSN: [id number] Arrival date & time: 10/16/20  1610     History Chief Complaint  Patient presents with   mass on buttock    Maria [name] is a 60 y.o. female with a history of diabetes mellitus type 2, obesity, hypertension, chronic pain, chronic opioid use who presents the emergency department with a chief complaint of left buttock mass.  The patient reports that she has had pain to the left buttock for the last year.  However, it is worsened over the last 4 months.  She was seen by her primary care provider who felt in knots and ordered an ultrasound that showed a solid hypoechoic mass to the left buttock.  She was advised to follow-up with general surgery.  She is established with Dr. Hassell Done at [facility] surgery.  She states that she called Dr. Earlie Server office today and was advised to come to the ED for evaluation of admission and surgery since she was unable to get an appointment until August.  She reports that she has been having worsening pain in her back, but symptoms have improved with injections.  She denies fever, chills, rectal pain, redness, swelling, red streaking, groin pain, dysuria, numbness, weakness.  She has been treating her symptoms with her home pain medication.   The history is provided by the patient and medical records. No language interpreter was used.      Past Medical History:  Diagnosis Date   Allergic rhinitis    Anemia    1980's   Anxiety    Carpal tunnel syndrome, left    pt denies   Depression    DUB (dysfunctional uterine bleeding)    Dysrhythmia    tachycardic   External hemorrhoid    GERD (gastroesophageal reflux disease)    Gout    History of hiatal hernia    History of kidney stones    Hypertension    Morbid obesity ([place])    Multiple thyroid nodules    Obesity    Osteoarthritis, knee    Pneumonia    Recurrent UTI    Rotator cuff syndrome of left shoulder    Type  II diabetes mellitus ([place])     Patient Active Problem List   Diagnosis Date Noted   Hypertension    Type II diabetes mellitus ([place])    Obesity    Closed fracture of fourth toe of left foot 10/17/2019   Achilles tendinitis of right lower extremity 12/23/2017   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   Cervical disc disorder with radiculopathy of cervical region 02/05/2016   Osteoarthritis of left hip 08/22/2014   Radicular pain in left arm 09/27/2013   Rotator cuff syndrome 06/29/2011   S/P TKR (total knee replacement) 01/17/2011   GOUT, UNSPECIFIED 10/09/2009   HEMORRHOIDS, EXTERNAL 06/17/2009   Depression 04/23/2009   NEPHROLITHIASIS, RECURRENT 04/23/2009   Carpal tunnel syndrome on left 01/26/2009   Disorder of bursae and tendons in shoulder region 09/19/2008   GERD 02/25/2008   Diabetes type 2, uncontrolled ([place]) 04/26/2006   Hyperlipidemia 04/26/2006   ANXIETY STATE NOS 04/26/2006   Obstructive sleep apnea 04/26/2006   MORBID OBESITY 01/11/2006   Essential hypertension 01/11/2006   ALLERGIC RHINITIS 01/11/2006   DYSFUNCTIONAL UTERINE BLEEDING 01/11/2006    Past Surgical History:  Procedure Laterality Date   CHOLECYSTECTOMY     HERNIA REPAIR     INCISION AND DRAINAGE ABSCESS N/A 06/01/2014   Procedure: INCISION AND  DRAINAGE ABSCESS;  Surgeon: Rolm Bookbinder, MD;  Location: [facility];  Service: General;  Laterality: N/A;   IR GENERIC HISTORICAL  05/18/2016   IR EPIDUROGRAPHY 05/18/2016 [place], MD [facility]INTER[facility]   JOINT REPLACEMENT     LAPAROSCOPIC GASTRIC SLEEVE RESECTION WITH HIATAL HERNIA REPAIR N/A 07/26/2016   Procedure: LAPAROSCOPIC GASTRIC SLEEVE RESECTION WITH HIATAL HERNIA REPAIR, UPPER ENDOSCOPY;  Surgeon: Johnathan Hausen, MD;  Location: [facility];  Service: General;  Laterality: N/A;   LITHOTRIPSY     PULMONARY THROMBECTOMY N/A 03/19/2018   Procedure: PULMONARY THROMBECTOMY;  Surgeon: Algernon Huxley, MD;  Location: [facility];   Service: Cardiovascular;  Laterality: N/A;   RADIOACTIVE SEED GUIDED EXCISIONAL BREAST BIOPSY Left 01/11/2019   Procedure: RADIOACTIVE SEED GUIDED OF 2 SITES EXCISIONAL LEFT BREAST BIOPSY;  Surgeon: Johnathan Hausen, MD;  Location: [facility];  Service: General;  Laterality: Left;   REPLACEMENT TOTAL KNEE BILATERAL     TUBAL LIGATION       OB History     Gravida  4   Para      Term      Preterm      AB  1   Living  2      SAB  1   IAB      Ectopic      Multiple      Live Births  3        Obstetric Comments  Patient had two twin pregnancies.  One Pregnancy resulted in miscarriage completely and second twin pregnancy resulted in one live birth.  She also had a son that was killed in the [employer].         Family History  Problem Relation Age of Onset   Coronary artery disease Other    Hypertension Other    Diabetes Other    Obesity Other    Diabetes Mother    Hypertension Mother    Congestive Heart Failure Mother     Social History   Tobacco Use   Smoking status: Never   Smokeless tobacco: Never  Vaping Use   Vaping Use: Never used  Substance Use Topics   Alcohol use: Yes    Comment: occasional   Drug use: No    Home Medications Prior to Admission medications   Medication Sig Start Date End Date Taking? Authorizing Provider  atorvastatin (LIPITOR) 20 MG tablet Take 20 mg by mouth every evening.    [provider]  benazepril (LOTENSIN) 40 MG tablet Take 40 mg by mouth every evening.  07/06/16   [provider]  Colchicine 0.6 MG CAPS 1 CAPSULE BY MOUTH 1-2 TIMES A DAY FOR 3 DAYS FOR GOUT FLARE AND 1 CAP DAILY FOR PROPHYLAXIS 07/07/20   Dickie La, MD  diazepam (VALIUM) 5 MG tablet Take 1 tablet one hour prior to MRI. 07/20/20   Hudnall, Sharyn Lull, MD  furosemide (LASIX) 20 MG tablet Take 20 mg by mouth every evening.  12/28/15   [provider]  gabapentin (NEURONTIN) 300 MG capsule Take 1 capsule (300 mg total) by mouth 3 (three)  times daily. 07/14/20   Hudnall, Sharyn Lull, MD  glipiZIDE (GLUCOTROL) 5 MG tablet Take 5 mg by mouth every evening.  07/13/17   [provider]  loperamide (IMODIUM A-D) 2 MG tablet Take 4 mg by mouth 4 (four) times daily as needed for diarrhea or loose stools.    [provider]  metFORMIN (GLUCOPHAGE) 850 MG tablet Take 850  mg by mouth 2 (two) times daily.  07/13/17   [provider]  methocarbamol (ROBAXIN) 500 MG tablet Take 1 tablet (500 mg total) by mouth 2 (two) times daily as needed for muscle spasms. 07/20/20   Hudnall, Sharyn Lull, MD  metoprolol tartrate (LOPRESSOR) 100 MG tablet Take 200 mg by mouth every evening.    [provider]  naloxone [facility]) 4 MG/0.1ML LIQD nasal spray kit Place 1 spray into the nose as needed (opioid overdose).    [provider]  nitroGLYCERIN (NITRODUR - DOSED IN MG/24 HR) 0.2 mg/hr patch Use 1/4 patch daily to the affected area. 01/15/19   Dickie La, MD  oxyCODONE (ROXICODONE) 15 MG immediate release tablet Take 1 tablet (15 mg total) by mouth every 6 (six) hours as needed for pain. Patient taking differently: Take 15 mg by mouth 5 (five) times daily.  03/22/18   Bettey Costa, MD  pantoprazole (PROTONIX) 40 MG tablet Take 40 mg by mouth every evening.     [provider]  predniSONE (DELTASONE) 10 MG tablet 6 tabs po day 1, 5 tabs po day 2, 4 tabs po day 3, 3 tabs po day 4, 2 tabs po day 5, 1 tab po day 6 07/13/20   Hudnall, Sharyn Lull, MD  Semaglutide, 1 MG/DOSE, (OZEMPIC, 1 MG/DOSE,) 2 MG/1.5ML SOPN Inject 1 mg into the skin every Monday.    [provider]  Vitamin D, Ergocalciferol, (DRISDOL) 1.25 MG (50000 UT) CAPS capsule Take 50,000 Units by mouth every Monday.    [provider]    Allergies    Propoxyphene n-acetaminophen  Review of Systems   Review of Systems  Constitutional:  Negative for activity change.  Respiratory:  Negative for shortness of breath.   Cardiovascular:  Negative for  chest pain.  Gastrointestinal:  Negative for abdominal pain, constipation, diarrhea, nausea and vomiting.  Genitourinary:  Negative for dysuria.  Musculoskeletal:  Positive for myalgias. Negative for back pain.  Skin:  Negative for rash.       Mass  Allergic/Immunologic: Negative for immunocompromised state.  Neurological:  Negative for dizziness, seizures, syncope, weakness, numbness and headaches.  Psychiatric/Behavioral:  Negative for confusion.    Physical Exam Updated Vital Signs BP (!) 143/84 (BP Location: Right Wrist)   Pulse 72   Temp 98.3 F (36.8 C) (Oral)   Resp 18   Ht '5\' 8"'  (1.727 m)   Wt (!) 141.5 kg   LMP 04/11/2009   SpO2 99%   BMI 47.44 kg/m   Physical Exam Vitals and nursing note reviewed.  Constitutional:      General: She is not in acute distress.    Appearance: She is not ill-appearing, toxic-appearing or diaphoretic.     Comments: No acute distress.  HENT:     Head: Normocephalic.  Eyes:     Conjunctiva/sclera: Conjunctivae normal.  Cardiovascular:     Rate and Rhythm: Normal rate and regular rhythm.     Heart sounds: No murmur heard.   No friction rub. No gallop.  Pulmonary:     Effort: Pulmonary effort is normal. No respiratory distress.     Breath sounds: No stridor. No wheezing, rhonchi or rales.  Chest:     Chest wall: No tenderness.  Abdominal:     General: There is no distension.     Palpations: Abdomen is soft. There is no mass.     Tenderness: There is no abdominal tenderness. There is no right CVA tenderness, left CVA  tenderness, guarding or rebound.     Hernia: No hernia is present.  Musculoskeletal:     Cervical back: Neck supple.       Back:     Right lower leg: No edema.     Left lower leg: No edema.  Skin:    General: Skin is warm.     Findings: No rash.  Neurological:     Mental Status: She is alert.  Psychiatric:        Behavior: Behavior normal.    ED Results / Procedures / Treatments   Labs (all labs ordered are  listed, but only abnormal results are displayed) Labs Reviewed  COMPREHENSIVE METABOLIC PANEL - Abnormal; Notable for the following components:      Result Value   Glucose, Bld 314 (*)    BUN 24 (*)    Creatinine, Ser 1.12 (*)    AST 14 (*)    GFR, Estimated 56 (*)    All other components within normal limits  CBC WITH DIFFERENTIAL/PLATELET    EKG None  Radiology No results found.    Procedures Procedures   Medications Ordered in ED Medications  oxyCODONE-acetaminophen (PERCOCET/ROXICET) 5-325 MG per tablet 1 tablet (has no administration in time range)    ED Course  I have reviewed the triage vital signs and the nursing notes.  Pertinent labs & imaging results that were available during my care of the patient were reviewed by me and considered in my medical decision making (see chart for details).    MDM Rules/Calculators/A&P                          60 year old female with a history of diabetes mellitus type 2, obesity, hypertension, chronic pain, chronic opioid use who presents the emergency department with a mass to her left buttock.  She has been having pain in the area for a year, but is worsened over the last 4 months.  She had an outpatient ultrasound ordered by her PCP was advised to follow-up with general surgery.  She called [place] surgery to schedule a follow-up and was advised to come to the ER for surgical evaluation and possible admission.  Vital signs stable.  Labs and imaging ordered in triage.  She does have hyperglycemia, but normal bicarb and anion gap.  This is likely due to having been in the ER for the last 8 hours and not having taken her nighttime dose of medications.  She is having no signs or symptoms of hypoglycemia.  Will defer treatment at this time since she has all of her home medications and is asymptomatic.  Consult to general surgery and spoke with Dr. Zenia Resides.  No indication for emergent surgery or admission.  She recommends that  the patient recall the office and speak with a scheduler about an appointment with any of their clinicians.  I discussed this plan with the patient who is agreeable at this time.  Was planning to send the patient home with a short course of medication, but she is currently under a pain contract with her primary care provider.  She was advised to continue to take her home pain medications.  She can supplement this with OTC analgesia and a doughnut pillow.  Doubt abscess, Fournier's gangrene, cellulitis, myositis.  She is hemodynamically stable in no acute distress.  Safer discharge to home with outpatient follow-up as discussed.  Final Clinical Impression(s) / ED Diagnoses Final diagnoses:  None  Rx / DC Orders ED Discharge Orders     None        Joanne Gavel, PA-C 10/17/20 Renaldo Fiddler, MD 10/17/20 2259

## 2020-10-16 NOTE — ED Provider Notes (Signed)
Emergency Medicine Provider Triage Evaluation Note  Maria [name] , a 60 y.o. female  was evaluated in triage.  Pt complains of a mass to the left buttock. Had an outpt [place] and called [place] surgery who advised her to come to the ed for eval of surgery.  Review of Systems  Positive: Mass to buttock Negative: fever  Physical Exam  BP (!) 147/76 (BP Location: Left Arm)   Pulse 69   Temp 98.3 F (36.8 C) (Oral)   Resp 16   Ht 5\' 8"  (1.727 m)   Wt (!) 141.5 kg   LMP 04/11/2009   SpO2 99%   BMI 47.44 kg/m  Gen:   Awake, no distress   Resp:  Normal effort  MSK:   Moves extremities without difficulty  Other:  Mobile firm mass to the left buttock  Medical Decision Making  Medically screening exam initiated at 5:33 PM.  Appropriate orders placed.  Tarissa Kerin was informed that the remainder of the evaluation will be completed by another provider, this initial triage assessment does not replace that evaluation, and the importance of remaining in the ED until their evaluation is complete.     Bishop Dublin 10/16/20 1737    Milton Ferguson, MD 10/18/20 1650

## 2020-10-16 NOTE — ED Notes (Signed)
Joline Maxcy, PA bedside

## 2020-10-16 NOTE — ED Triage Notes (Signed)
Patient reports a mass on her left buttock. Patient states she had an [place] of th eleft buttocks last month and was told to come to the ED for possible admission.  Report stated a solid hypoechoic mass to the left buttock.

## 2020-10-17 NOTE — Discharge Instructions (Addendum)
Thank you for allowing me to care for you today in the Emergency Department.   Make sure to take your home medications when you get home.  You can supplement your home oxycodone with Motrin and Tylenol.  Try purchasing a donut pillow, which is available over-the-counter, to see if this will relieve pain and pressure on her buttocks.  Return to the emergency department if you start having fever, chills, rectal pain, red streaking, redness or swelling to the buttock, or other new, concerning symptoms.

## 2020-10-20 ENCOUNTER — Ambulatory Visit: Payer: PPO | Admitting: Surgery

## 2020-10-22 DIAGNOSIS — M7989 Other specified soft tissue disorders: Secondary | ICD-10-CM | POA: Diagnosis not present

## 2020-10-26 ENCOUNTER — Other Ambulatory Visit: Payer: Self-pay | Admitting: Surgery

## 2020-10-26 DIAGNOSIS — M7989 Other specified soft tissue disorders: Secondary | ICD-10-CM

## 2020-10-27 DIAGNOSIS — E119 Type 2 diabetes mellitus without complications: Secondary | ICD-10-CM | POA: Diagnosis not present

## 2020-10-27 DIAGNOSIS — G8929 Other chronic pain: Secondary | ICD-10-CM | POA: Diagnosis not present

## 2020-10-27 DIAGNOSIS — M545 Low back pain, unspecified: Secondary | ICD-10-CM | POA: Diagnosis not present

## 2020-10-27 DIAGNOSIS — Z79899 Other long term (current) drug therapy: Secondary | ICD-10-CM | POA: Diagnosis not present

## 2020-10-27 DIAGNOSIS — E538 Deficiency of other specified B group vitamins: Secondary | ICD-10-CM | POA: Diagnosis not present

## 2020-10-27 DIAGNOSIS — M109 Gout, unspecified: Secondary | ICD-10-CM | POA: Diagnosis not present

## 2020-10-27 DIAGNOSIS — R768 Other specified abnormal immunological findings in serum: Secondary | ICD-10-CM | POA: Diagnosis not present

## 2020-10-29 DIAGNOSIS — Z79899 Other long term (current) drug therapy: Secondary | ICD-10-CM | POA: Diagnosis not present

## 2020-10-30 ENCOUNTER — Ambulatory Visit
Admission: RE | Admit: 2020-10-30 | Discharge: 2020-10-30 | Disposition: A | Discharge status: Home / Self Care | Payer: PPO | Source: Ambulatory Visit | Attending: Surgery | Admitting: Surgery

## 2020-10-30 ENCOUNTER — Other Ambulatory Visit: Payer: Self-pay

## 2020-10-30 DIAGNOSIS — M7989 Other specified soft tissue disorders: Secondary | ICD-10-CM | POA: Diagnosis not present

## 2020-10-30 MED ORDER — IOPAMIDOL (ISOVUE-300) INJECTION 61%
100.0000 mL | Freq: Once | INTRAVENOUS | Status: AC | PRN
  Administered 2020-10-30: 100 mL via INTRAVENOUS

## 2020-11-09 ENCOUNTER — Other Ambulatory Visit: Payer: PPO

## 2020-11-19 ENCOUNTER — Other Ambulatory Visit: Payer: Self-pay

## 2020-11-19 ENCOUNTER — Encounter (HOSPITAL_BASED_OUTPATIENT_CLINIC_OR_DEPARTMENT_OTHER): Payer: Self-pay | Admitting: Surgery

## 2020-11-24 ENCOUNTER — Ambulatory Visit (INDEPENDENT_AMBULATORY_CARE_PROVIDER_SITE_OTHER): Payer: PPO | Admitting: Sports Medicine

## 2020-11-24 ENCOUNTER — Other Ambulatory Visit: Payer: Self-pay

## 2020-11-24 VITALS — BP 151/81 | Ht 68.0 in | Wt 293.0 lb

## 2020-11-24 DIAGNOSIS — M545 Low back pain, unspecified: Secondary | ICD-10-CM | POA: Diagnosis not present

## 2020-11-24 DIAGNOSIS — M79605 Pain in left leg: Secondary | ICD-10-CM

## 2020-11-24 MED ORDER — METHOCARBAMOL 500 MG PO TABS: 500.0000 mg | ORAL_TABLET | Freq: Two times a day (BID) | ORAL | 0 refills | Status: DC | PRN

## 2020-11-24 MED ORDER — GABAPENTIN 300 MG PO CAPS: 300.0000 mg | ORAL_CAPSULE | Freq: Three times a day (TID) | ORAL | 2 refills | Status: DC

## 2020-11-24 NOTE — Patient Instructions (Signed)
-  Cathy from [facility] will call you to schedule the low back injection. -Refills sent to your pharmacy. -Start physical therapy -Follow up with me as needed.

## 2020-11-25 ENCOUNTER — Other Ambulatory Visit: Payer: Self-pay | Admitting: Sports Medicine

## 2020-11-25 ENCOUNTER — Encounter (HOSPITAL_BASED_OUTPATIENT_CLINIC_OR_DEPARTMENT_OTHER)
Admission: RE | Admit: 2020-11-25 | Discharge: 2020-11-25 | Disposition: A | Discharge status: Home / Self Care | Payer: PPO | Source: Ambulatory Visit | Attending: Surgery | Admitting: Surgery

## 2020-11-25 DIAGNOSIS — Z86711 Personal history of pulmonary embolism: Secondary | ICD-10-CM | POA: Diagnosis not present

## 2020-11-25 DIAGNOSIS — E119 Type 2 diabetes mellitus without complications: Secondary | ICD-10-CM | POA: Diagnosis not present

## 2020-11-25 DIAGNOSIS — G8929 Other chronic pain: Secondary | ICD-10-CM | POA: Diagnosis not present

## 2020-11-25 DIAGNOSIS — Z79899 Other long term (current) drug therapy: Secondary | ICD-10-CM | POA: Diagnosis not present

## 2020-11-25 DIAGNOSIS — R229 Localized swelling, mass and lump, unspecified: Secondary | ICD-10-CM | POA: Diagnosis present

## 2020-11-25 DIAGNOSIS — M545 Low back pain, unspecified: Secondary | ICD-10-CM | POA: Diagnosis not present

## 2020-11-25 DIAGNOSIS — Z7984 Long term (current) use of oral hypoglycemic drugs: Secondary | ICD-10-CM | POA: Diagnosis not present

## 2020-11-25 DIAGNOSIS — M79605 Pain in left leg: Secondary | ICD-10-CM

## 2020-11-25 DIAGNOSIS — M109 Gout, unspecified: Secondary | ICD-10-CM | POA: Diagnosis not present

## 2020-11-25 DIAGNOSIS — Z9884 Bariatric surgery status: Secondary | ICD-10-CM | POA: Diagnosis not present

## 2020-11-25 DIAGNOSIS — R768 Other specified abnormal immunological findings in serum: Secondary | ICD-10-CM | POA: Diagnosis not present

## 2020-11-25 DIAGNOSIS — Z96653 Presence of artificial knee joint, bilateral: Secondary | ICD-10-CM | POA: Diagnosis not present

## 2020-11-25 DIAGNOSIS — D171 Benign lipomatous neoplasm of skin and subcutaneous tissue of trunk: Secondary | ICD-10-CM | POA: Diagnosis not present

## 2020-11-25 LAB — BASIC METABOLIC PANEL
Anion gap: 8 (ref 5–15)
BUN: 14 mg/dL (ref 8–23)
CO2: 24 mmol/L (ref 22–32)
Calcium: 8.7 mg/dL — ABNORMAL LOW (ref 8.9–10.3)
Chloride: 108 mmol/L (ref 98–111)
Creatinine, Ser: 1.11 mg/dL — ABNORMAL HIGH (ref 0.44–1.00)
GFR, Estimated: 57 mL/min — ABNORMAL LOW (ref >60)
Glucose, Bld: 228 mg/dL — ABNORMAL HIGH (ref 70–99)
Potassium: 3.9 mmol/L (ref 3.5–5.1)
Sodium: 140 mmol/L (ref 135–145)

## 2020-11-25 NOTE — Progress Notes (Signed)
Patient informed to drink 12 oz of water by 7:30 day of surgery. CHG solution given to patient and patient educated provided. Patient verbalized understanding.

## 2020-11-25 NOTE — Progress Notes (Signed)
Reviewed chart and updated BMI for anesthesia consult with Dr. Royce Macadamia. Okay to proceed with surgery as scheduled.

## 2020-11-25 NOTE — Progress Notes (Signed)
Patient ID: Maria [name], female   DOB: 07/03/1960, 60 y.o.   MRN: [id number]  Ajiah presents today with persistent left-sided low back pain.  She has undergone 2 previous lumbar ESI's.  First ESI at the L4-L5 level on the left was done in April and this provided her with several weeks of relief.  Unfortunately, her repeat injection in June did not last as long.  Her pain has returned.  It is identical in nature to what she experienced previously.  Pain is primarily along the posterior left hip and into the posterior left leg.  It is not radiating past the knee.  No recent trauma.  Previous MRI showed multilevel lumbar spondylosis most pronounced at L4-L5 where there is moderate left foraminal stenosis.  Physical exam was not repeated today.  We simply talked about her returning low back pain.  I recommended that we try a third lumbar ESI but schedule it with the interventional radiologist that performed her first injection.  We will also refer her for physical therapy.  If her symptoms persist after PT and repeat ESI, she may need surgical consultation.  I have agreed to refill her Robaxin and gabapentin today.  Follow-up as needed.

## 2020-11-26 ENCOUNTER — Inpatient Hospital Stay
Admission: RE | Admit: 2020-11-26 | Discharge: 2020-11-26 | Disposition: A | Discharge status: Home / Self Care | Payer: PPO | Source: Ambulatory Visit | Attending: Sports Medicine | Admitting: Sports Medicine

## 2020-11-26 NOTE — Discharge Instructions (Signed)

## 2020-11-27 ENCOUNTER — Ambulatory Visit (HOSPITAL_BASED_OUTPATIENT_CLINIC_OR_DEPARTMENT_OTHER): Payer: PPO | Admitting: Certified Registered"

## 2020-11-27 ENCOUNTER — Encounter (HOSPITAL_BASED_OUTPATIENT_CLINIC_OR_DEPARTMENT_OTHER): Payer: Self-pay | Admitting: Surgery

## 2020-11-27 ENCOUNTER — Other Ambulatory Visit: Payer: Self-pay

## 2020-11-27 ENCOUNTER — Ambulatory Visit (HOSPITAL_BASED_OUTPATIENT_CLINIC_OR_DEPARTMENT_OTHER)
Admission: RE | Admit: 2020-11-27 | Discharge: 2020-11-27 | Disposition: A | Discharge status: Home / Self Care | Payer: PPO | Attending: Surgery | Admitting: Surgery

## 2020-11-27 ENCOUNTER — Encounter (HOSPITAL_BASED_OUTPATIENT_CLINIC_OR_DEPARTMENT_OTHER)
Admission: RE | Disposition: A | Discharge status: Home / Self Care | Payer: Self-pay | Source: Home / Self Care | Attending: Surgery

## 2020-11-27 DIAGNOSIS — Z86711 Personal history of pulmonary embolism: Secondary | ICD-10-CM | POA: Diagnosis not present

## 2020-11-27 DIAGNOSIS — Z7984 Long term (current) use of oral hypoglycemic drugs: Secondary | ICD-10-CM | POA: Diagnosis not present

## 2020-11-27 DIAGNOSIS — Z9884 Bariatric surgery status: Secondary | ICD-10-CM | POA: Diagnosis not present

## 2020-11-27 DIAGNOSIS — D171 Benign lipomatous neoplasm of skin and subcutaneous tissue of trunk: Secondary | ICD-10-CM | POA: Insufficient documentation

## 2020-11-27 DIAGNOSIS — Z96653 Presence of artificial knee joint, bilateral: Secondary | ICD-10-CM | POA: Insufficient documentation

## 2020-11-27 DIAGNOSIS — Z79899 Other long term (current) drug therapy: Secondary | ICD-10-CM | POA: Insufficient documentation

## 2020-11-27 DIAGNOSIS — I10 Essential (primary) hypertension: Secondary | ICD-10-CM | POA: Diagnosis not present

## 2020-11-27 DIAGNOSIS — E119 Type 2 diabetes mellitus without complications: Secondary | ICD-10-CM | POA: Diagnosis not present

## 2020-11-27 DIAGNOSIS — R222 Localized swelling, mass and lump, trunk: Secondary | ICD-10-CM | POA: Diagnosis not present

## 2020-11-27 HISTORY — PX: MASS EXCISION: SHX2000

## 2020-11-27 HISTORY — DX: Other pulmonary embolism without acute cor pulmonale: I26.99

## 2020-11-27 LAB — GLUCOSE, CAPILLARY
Glucose-Capillary: 124 mg/dL — ABNORMAL HIGH (ref 70–99)
Glucose-Capillary: 123 mg/dL — ABNORMAL HIGH (ref 70–99)

## 2020-11-27 SURGERY — EXCISION MASS
Anesthesia: General | Site: Buttocks | Laterality: Left

## 2020-11-27 MED ORDER — CHLORHEXIDINE GLUCONATE CLOTH 2 % EX PADS: 6.0000 | MEDICATED_PAD | Freq: Once | CUTANEOUS | Status: DC

## 2020-11-27 MED ORDER — LIDOCAINE HCL (PF) 1 % IJ SOLN
INTRAMUSCULAR | Status: AC
  Filled 2020-11-27: qty 30

## 2020-11-27 MED ORDER — HYDROMORPHONE HCL 1 MG/ML IJ SOLN
INTRAMUSCULAR | Status: AC
  Filled 2020-11-27: qty 0.5

## 2020-11-27 MED ORDER — ACETAMINOPHEN 500 MG PO TABS: 1000.0000 mg | ORAL_TABLET | ORAL | Status: DC

## 2020-11-27 MED ORDER — ROCURONIUM BROMIDE 100 MG/10ML IV SOLN
INTRAVENOUS | Status: DC | PRN
  Administered 2020-11-27: 70 mg via INTRAVENOUS

## 2020-11-27 MED ORDER — DEXAMETHASONE SODIUM PHOSPHATE 10 MG/ML IJ SOLN
INTRAMUSCULAR | Status: AC
  Filled 2020-11-27: qty 1

## 2020-11-27 MED ORDER — PROPOFOL 10 MG/ML IV BOLUS
INTRAVENOUS | Status: DC | PRN
  Administered 2020-11-27: 200 mg via INTRAVENOUS

## 2020-11-27 MED ORDER — SCOPOLAMINE 1 MG/3DAYS TD PT72
MEDICATED_PATCH | TRANSDERMAL | Status: AC
  Filled 2020-11-27: qty 1

## 2020-11-27 MED ORDER — OXYCODONE HCL 5 MG/5ML PO SOLN
5.0000 mg | Freq: Once | ORAL | Status: AC | PRN
Start: 2020-11-27 — End: 2020-11-27

## 2020-11-27 MED ORDER — MIDAZOLAM HCL 2 MG/2ML IJ SOLN: 0.5000 mg | Freq: Once | INTRAMUSCULAR | Status: DC | PRN

## 2020-11-27 MED ORDER — BUPIVACAINE HCL (PF) 0.5 % IJ SOLN: INTRAMUSCULAR | Status: DC | PRN

## 2020-11-27 MED ORDER — PHENYLEPHRINE HCL (PRESSORS) 10 MG/ML IV SOLN
INTRAVENOUS | Status: DC | PRN
  Administered 2020-11-27: 120 ug via INTRAVENOUS
  Administered 2020-11-27: 80 ug via INTRAVENOUS

## 2020-11-27 MED ORDER — OXYCODONE HCL 5 MG PO TABS
ORAL_TABLET | ORAL | Status: AC
  Filled 2020-11-27: qty 1

## 2020-11-27 MED ORDER — MEPERIDINE HCL 25 MG/ML IJ SOLN: 6.2500 mg | INTRAMUSCULAR | Status: DC | PRN

## 2020-11-27 MED ORDER — CEFAZOLIN IN SODIUM CHLORIDE 3-0.9 GM/100ML-% IV SOLN
INTRAVENOUS | Status: AC
  Filled 2020-11-27: qty 100

## 2020-11-27 MED ORDER — OXYCODONE HCL 5 MG PO TABS
5.0000 mg | ORAL_TABLET | Freq: Once | ORAL | Status: AC | PRN
  Administered 2020-11-27: 5 mg via ORAL

## 2020-11-27 MED ORDER — LIDOCAINE HCL (PF) 2 % IJ SOLN
INTRAMUSCULAR | Status: AC
  Filled 2020-11-27: qty 5

## 2020-11-27 MED ORDER — DEXAMETHASONE SODIUM PHOSPHATE 4 MG/ML IJ SOLN
INTRAMUSCULAR | Status: DC | PRN
  Administered 2020-11-27: 10 mg via INTRAVENOUS

## 2020-11-27 MED ORDER — ACETAMINOPHEN 500 MG PO TABS
ORAL_TABLET | ORAL | Status: AC
  Filled 2020-11-27: qty 2

## 2020-11-27 MED ORDER — ACETAMINOPHEN 500 MG PO TABS
1000.0000 mg | ORAL_TABLET | Freq: Once | ORAL | Status: AC
  Administered 2020-11-27: 1000 mg via ORAL

## 2020-11-27 MED ORDER — BACITRACIN-NEOMYCIN-POLYMYXIN OINTMENT TUBE
TOPICAL_OINTMENT | CUTANEOUS | Status: AC
  Filled 2020-11-27: qty 14.17

## 2020-11-27 MED ORDER — LIDOCAINE HCL (PF) 2 % IJ SOLN
INTRAMUSCULAR | Status: DC | PRN
  Administered 2020-11-27: 20 mg via INTRADERMAL

## 2020-11-27 MED ORDER — LIDOCAINE HCL 1 % IJ SOLN
INTRAMUSCULAR | Status: DC | PRN
  Administered 2020-11-27: 8 mL

## 2020-11-27 MED ORDER — PROMETHAZINE HCL 25 MG/ML IJ SOLN: 6.2500 mg | INTRAMUSCULAR | Status: DC | PRN

## 2020-11-27 MED ORDER — SCOPOLAMINE 1 MG/3DAYS TD PT72
1.0000 | MEDICATED_PATCH | TRANSDERMAL | Status: DC
  Administered 2020-11-27: 1.5 mg via TRANSDERMAL

## 2020-11-27 MED ORDER — HYDROMORPHONE HCL 1 MG/ML IJ SOLN
0.2500 mg | INTRAMUSCULAR | Status: DC | PRN
  Administered 2020-11-27 (×2): 0.5 mg via INTRAVENOUS

## 2020-11-27 MED ORDER — MIDAZOLAM HCL 2 MG/2ML IJ SOLN
INTRAMUSCULAR | Status: AC
  Filled 2020-11-27: qty 2

## 2020-11-27 MED ORDER — CEFAZOLIN IN SODIUM CHLORIDE 3-0.9 GM/100ML-% IV SOLN
3.0000 g | INTRAVENOUS | Status: AC
Start: 2020-11-27 — End: 2020-11-27
  Administered 2020-11-27: 3 g via INTRAVENOUS

## 2020-11-27 MED ORDER — OXYCODONE HCL 15 MG PO TABS: 15.0000 mg | ORAL_TABLET | Freq: Four times a day (QID) | ORAL | 0 refills | Status: DC | PRN

## 2020-11-27 MED ORDER — SUGAMMADEX SODIUM 500 MG/5ML IV SOLN
INTRAVENOUS | Status: DC | PRN
  Administered 2020-11-27: 500 mg via INTRAVENOUS

## 2020-11-27 MED ORDER — FENTANYL CITRATE (PF) 100 MCG/2ML IJ SOLN
INTRAMUSCULAR | Status: AC
  Filled 2020-11-27: qty 2

## 2020-11-27 MED ORDER — LACTATED RINGERS IV SOLN: INTRAVENOUS | Status: DC

## 2020-11-27 MED ORDER — BUPIVACAINE HCL (PF) 0.5 % IJ SOLN
INTRAMUSCULAR | Status: AC
  Filled 2020-11-27: qty 30

## 2020-11-27 MED ORDER — FENTANYL CITRATE (PF) 100 MCG/2ML IJ SOLN
INTRAMUSCULAR | Status: DC | PRN
  Administered 2020-11-27: 100 ug via INTRAVENOUS

## 2020-11-27 MED ORDER — ONDANSETRON HCL 4 MG/2ML IJ SOLN
INTRAMUSCULAR | Status: DC | PRN
  Administered 2020-11-27: 4 mg via INTRAVENOUS

## 2020-11-27 MED ORDER — ONDANSETRON HCL 4 MG/2ML IJ SOLN
INTRAMUSCULAR | Status: AC
  Filled 2020-11-27: qty 2

## 2020-11-27 MED ORDER — MIDAZOLAM HCL 5 MG/5ML IJ SOLN
INTRAMUSCULAR | Status: DC | PRN
  Administered 2020-11-27: 1 mg via INTRAVENOUS

## 2020-11-27 SURGICAL SUPPLY — 50 items
ADH SKN CLS APL DERMABOND .7 (GAUZE/BANDAGES/DRESSINGS) ×1
APL SKNCLS STERI-STRIP NONHPOA (GAUZE/BANDAGES/DRESSINGS)
BENZOIN TINCTURE PRP APPL 2/3 (GAUZE/BANDAGES/DRESSINGS) IMPLANT
BLADE CLIPPER SURG (BLADE) IMPLANT
BLADE SURG 15 STRL LF DISP TIS (BLADE) ×1 IMPLANT
BLADE SURG 15 STRL SS (BLADE) ×2
CANISTER SUCT 1200ML W/VALVE (MISCELLANEOUS) IMPLANT
CLEANER CAUTERY TIP 5X5 PAD (MISCELLANEOUS) ×1 IMPLANT
COVER BACK TABLE 60X90IN (DRAPES) ×2 IMPLANT
COVER MAYO STAND STRL (DRAPES) ×2 IMPLANT
DECANTER SPIKE VIAL GLASS SM (MISCELLANEOUS) IMPLANT
DERMABOND ADVANCED (GAUZE/BANDAGES/DRESSINGS) ×1
DERMABOND ADVANCED .7 DNX12 (GAUZE/BANDAGES/DRESSINGS) IMPLANT
DRAPE LAPAROTOMY 100X72 PEDS (DRAPES) IMPLANT
DRAPE U-SHAPE 76X120 STRL (DRAPES) IMPLANT
DRSG TEGADERM 2-3/8X2-3/4 SM (GAUZE/BANDAGES/DRESSINGS) IMPLANT
ELECT REM PT RETURN 9FT ADLT (ELECTROSURGICAL) ×2
ELECTRODE REM PT RTRN 9FT ADLT (ELECTROSURGICAL) ×1 IMPLANT
GAUZE SPONGE 4X4 12PLY STRL LF (GAUZE/BANDAGES/DRESSINGS) ×1 IMPLANT
GLOVE SURG ENC MOIS LTX SZ8 (GLOVE) ×3 IMPLANT
GOWN STRL REUS W/ TWL LRG LVL3 (GOWN DISPOSABLE) ×1 IMPLANT
GOWN STRL REUS W/ TWL XL LVL3 (GOWN DISPOSABLE) ×1 IMPLANT
GOWN STRL REUS W/TWL 2XL LVL3 (GOWN DISPOSABLE) ×1 IMPLANT
GOWN STRL REUS W/TWL LRG LVL3 (GOWN DISPOSABLE) ×2
GOWN STRL REUS W/TWL XL LVL3 (GOWN DISPOSABLE) ×2
NDL HYPO 25X1 1.5 SAFETY (NEEDLE) ×1 IMPLANT
NDL PRECISIONGLIDE 27X1.5 (NEEDLE) IMPLANT
NEEDLE HYPO 25X1 1.5 SAFETY (NEEDLE) ×2 IMPLANT
NEEDLE PRECISIONGLIDE 27X1.5 (NEEDLE) IMPLANT
NS IRRIG 1000ML POUR BTL (IV SOLUTION) ×1 IMPLANT
PACK BASIN DAY SURGERY FS (CUSTOM PROCEDURE TRAY) ×2 IMPLANT
PAD CLEANER CAUTERY TIP 5X5 (MISCELLANEOUS)
PENCIL SMOKE EVACUATOR (MISCELLANEOUS) ×2 IMPLANT
SPONGE GAUZE 2X2 8PLY STRL LF (GAUZE/BANDAGES/DRESSINGS) ×2 IMPLANT
STRIP CLOSURE SKIN 1/2X4 (GAUZE/BANDAGES/DRESSINGS) IMPLANT
SUT ETHILON 3 0 FSL (SUTURE) IMPLANT
SUT ETHILON 5 0 PS 2 18 (SUTURE) IMPLANT
SUT MNCRL AB 4-0 PS2 18 (SUTURE) IMPLANT
SUT VIC AB 3-0 SH 27 (SUTURE)
SUT VIC AB 3-0 SH 27X BRD (SUTURE) IMPLANT
SUT VIC AB 4-0 SH 18 (SUTURE) ×2 IMPLANT
SUT VIC AB 5-0 PS2 18 (SUTURE) IMPLANT
SUT VICRYL 3-0 CR8 SH (SUTURE) IMPLANT
SYR BULB EAR ULCER 3OZ GRN STR (SYRINGE) ×1 IMPLANT
SYR CONTROL 10ML LL (SYRINGE) ×2 IMPLANT
TOWEL GREEN STERILE FF (TOWEL DISPOSABLE) ×3 IMPLANT
TRAY DSU PREP LF (CUSTOM PROCEDURE TRAY) ×2 IMPLANT
TUBE CONNECTING 20X1/4 (TUBING) ×1 IMPLANT
UNDERPAD 30X36 HEAVY ABSORB (UNDERPADS AND DIAPERS) IMPLANT
YANKAUER SUCT BULB TIP NO VENT (SUCTIONS) ×1 IMPLANT

## 2020-11-27 NOTE — Anesthesia Preprocedure Evaluation (Addendum)
Anesthesia Evaluation  Patient identified by MRN, date of birth, ID band Patient awake    Reviewed: Allergy & Precautions, NPO status , Patient's Chart, lab work & pertinent test results, reviewed documented beta blocker date and time   History of Anesthesia Complications Negative for: history of anesthetic complications  Airway Mallampati: II  TM Distance: >3 FB Neck ROM: Full    Dental  (+) Dental Advisory Given   Pulmonary sleep apnea (does not use CPAP) ,    breath sounds clear to auscultation       Cardiovascular hypertension, Pt. on medications and Pt. on home beta blockers (-) angina Rhythm:Regular Rate:Normal  '19 ECHO: EF 55% to 65%. LV wall motion was normal, no regional wall motion abnormalities, no significant valvular abnormalities '12 Stress: Apical thinning without demonstrated wall motion abnormality.No definite signs of exercise induced myocardial ischemia. LV EF 55%.    Neuro/Psych Anxiety Depression negative neurological ROS     GI/Hepatic Neg liver ROS, GERD  Medicated and Controlled,S/p gastric sleeve   Endo/Other  diabetes (glu 124), Oral Hypoglycemic AgentsMorbid obesity  Renal/GU stones     Musculoskeletal  (+) Arthritis ,   Abdominal (+) + obese,   Peds  Hematology negative hematology ROS (+)   Anesthesia Other Findings   Reproductive/Obstetrics                            Anesthesia Physical Anesthesia Plan  ASA: 3  Anesthesia Plan: General   Post-op Pain Management:    Induction: Intravenous  PONV Risk Score and Plan: 3 and Ondansetron, Dexamethasone and Scopolamine patch - Pre-op  Airway Management Planned: Oral ETT  Additional Equipment: None  Intra-op Plan:   Post-operative Plan: Extubation in OR  Informed Consent: I have reviewed the patients History and Physical, chart, labs and discussed the procedure including the risks, benefits and  alternatives for the proposed anesthesia with the patient or authorized representative who has indicated his/her understanding and acceptance.     Dental advisory given  Plan Discussed with: CRNA and Surgeon  Anesthesia Plan Comments:         Anesthesia Quick Evaluation

## 2020-11-27 NOTE — Discharge Instructions (Addendum)
Oxycodone given at 12:30pm today   Post Anesthesia Home Care Instructions  Activity: Get plenty of rest for the remainder of the day. A responsible individual must stay with you for 24 hours following the procedure.  For the next 24 hours, DO NOT: -Drive a car -Paediatric nurse -Drink alcoholic beverages -Take any medication unless instructed by your physician -Make any legal decisions or sign important papers.  Meals: Start with liquid foods such as gelatin or soup. Progress to regular foods as tolerated. Avoid greasy, spicy, heavy foods. If nausea and/or vomiting occur, drink only clear liquids until the nausea and/or vomiting subsides. Call your physician if vomiting continues.  Special Instructions/Symptoms: Your throat may feel dry or sore from the anesthesia or the breathing tube placed in your throat during surgery. If this causes discomfort, gargle with warm salt water. The discomfort should disappear within 24 hours.  If you had a scopolamine patch placed behind your ear for the management of post- operative nausea and/or vomiting:  1. The medication in the patch is effective for 72 hours, after which it should be removed.  Wrap patch in a tissue and discard in the trash. Wash hands thoroughly with soap and water. 2. You may remove the patch earlier than 72 hours if you experience unpleasant side effects which may include dry mouth, dizziness or visual disturbances. 3. Avoid touching the patch. Wash your hands with soap and water after contact with the patch.  May have Tylenol at 4:30pm today

## 2020-11-27 NOTE — Anesthesia Postprocedure Evaluation (Signed)
Anesthesia Post Note  Patient: Maria [name]  Procedure(s) Performed: EXCISION OF MASS IN LEFT BUTTOCK (Left: Buttocks)     Patient location during evaluation: Phase II Anesthesia Type: General Level of consciousness: awake and alert, patient cooperative and oriented Pain management: pain level controlled Vital Signs Assessment: post-procedure vital signs reviewed and stable Respiratory status: spontaneous breathing, nonlabored ventilation and respiratory function stable Cardiovascular status: blood pressure returned to baseline and stable Postop Assessment: no apparent nausea or vomiting, adequate PO intake and able to ambulate Anesthetic complications: no   No notable events documented.  Last Vitals:  Vitals:   11/27/20 1215 11/27/20 1233  BP: 135/78 (!) 144/99  Pulse: 72 77  Resp: (!) 8 18  Temp:  36.7 C  SpO2: 95% 98%    Last Pain:  Vitals:   11/27/20 1233  TempSrc: Oral  PainSc: 4                  Finlay Godbee,E. Makai Dumond

## 2020-11-27 NOTE — Transfer of Care (Signed)
Immediate Anesthesia Transfer of Care Note  Patient: Maria [name]  Procedure(s) Performed: EXCISION OF MASS IN LEFT BUTTOCK (Left)  Patient Location: PACU  Anesthesia Type:General  Level of Consciousness: drowsy  Airway & Oxygen Therapy: Patient Spontanous Breathing and Patient connected to face mask oxygen  Post-op Assessment: Report given to RN and Post -op Vital signs reviewed and stable  Post vital signs: Reviewed and stable  Last Vitals:  Vitals Value Taken Time  BP 141/81 11/27/20 1134  Temp    Pulse 74 11/27/20 1136  Resp 8 11/27/20 1136  SpO2 100 % 11/27/20 1136  Vitals shown include unvalidated device data.  Last Pain:  Vitals:   11/27/20 1018  TempSrc: Oral  PainSc: 7       Patients Stated Pain Goal: 7 ([id number])  Complications: No notable events documented.

## 2020-11-27 NOTE — Op Note (Signed)
Maria [name]  July 14, 1960   11/27/2020    PCP:  Simona Huh, NP   Surgeon: Kaylyn Lim, MD, FACS  Asst:  none  Anes:  General endo in prone position  Preop Dx: Painful mass in left buttock Postop Dx: Same-probably lipoma  Procedure: Excision of mass of left buttock Location Surgery: CDS 2 Complications: None   EBL:   minimal cc  Drains: none  Description of Procedure:  The patient was taken to OR 2 .  After anesthesia was administered and the patient was prepped  with [employer]  and a timeout was performed.  In the holding area we had found the mass together and marked it.  A transverse incision was made after injecting with lidocaine 1%.  A lipomas was shelled out en toto.  This was sent for permanents.  Minimal bleeding from wound edges controlled with Bovie.  Wound closed with 4-0 vicryl and Dermabond.    The patient tolerated the procedure well and was taken to the PACU in stable condition.     Matt B. Hassell Done, [place], [facility], [place]

## 2020-11-27 NOTE — H&P (Signed)
Chief Complaint:  tender knot on left buttocks  History of Present Illness:  Maria [name] is an 60 y.o. female who presents with a tender mass in the upper left buttock that is about 2 fb from the buttock cleft.  It was localized with the patient's help  Past Medical History:  Diagnosis Date   Allergic rhinitis    Anemia    1980's   Anxiety    Carpal tunnel syndrome, left    pt denies   Depression    DUB (dysfunctional uterine bleeding)    Dysrhythmia    tachycardic   External hemorrhoid    GERD (gastroesophageal reflux disease)    Gout    History of hiatal hernia    History of kidney stones    Hypertension    Morbid obesity (HCC)    Multiple thyroid nodules    Obesity    Osteoarthritis, knee    PE (pulmonary thromboembolism) (HCC)    Pneumonia    Recurrent UTI    Rotator cuff syndrome of left shoulder    Type II diabetes mellitus ([place])     Past Surgical History:  Procedure Laterality Date   CHOLECYSTECTOMY     HERNIA REPAIR     INCISION AND DRAINAGE ABSCESS N/A 06/01/2014   Procedure: INCISION AND DRAINAGE ABSCESS;  Surgeon: Rolm Bookbinder, MD;  Location: [facility];  Service: General;  Laterality: N/A;   IR GENERIC HISTORICAL  05/18/2016   IR EPIDUROGRAPHY 05/18/2016 San Morelle, MD [facility]   JOINT REPLACEMENT     LAPAROSCOPIC GASTRIC SLEEVE RESECTION WITH HIATAL HERNIA REPAIR N/A 07/26/2016   Procedure: LAPAROSCOPIC GASTRIC SLEEVE RESECTION WITH HIATAL HERNIA REPAIR, UPPER ENDOSCOPY;  Surgeon: Johnathan Hausen, MD;  [facility];  Service: General;  Laterality: N/A;   LITHOTRIPSY     PULMONARY THROMBECTOMY N/A 03/19/2018   Procedure: PULMONARY THROMBECTOMY;  Surgeon: Algernon Huxley, MD;  Location: [facility];  Service: Cardiovascular;  Laterality: N/A;   RADIOACTIVE SEED GUIDED EXCISIONAL BREAST BIOPSY Left 01/11/2019   Procedure: RADIOACTIVE SEED GUIDED OF 2 SITES EXCISIONAL LEFT BREAST BIOPSY;  Surgeon: Johnathan Hausen, MD;  Location: [facility];   Service: General;  Laterality: Left;   REPLACEMENT TOTAL KNEE BILATERAL     TUBAL LIGATION      Current Facility-Administered Medications  Medication Dose Route Frequency Provider Last Rate Last Admin   acetaminophen (TYLENOL) tablet 1,000 mg  1,000 mg Oral On Call to OR Johnathan Hausen, MD       ceFAZolin (ANCEF) IVPB 3g/100 mL premix  3 g Intravenous On Call to OR Johnathan Hausen, MD       Chlorhexidine Gluconate Cloth 2 % PADS 6 each  6 each Topical Once Johnathan Hausen, MD       And   Chlorhexidine Gluconate Cloth 2 % PADS 6 each  6 each Topical Once Johnathan Hausen, MD       lactated ringers infusion   Intravenous Continuous Merlinda Frederick, MD 10 mL/hr at 11/27/20 1024 New Bag at 11/27/20 1024   scopolamine (TRANSDERM-SCOP) 1 MG/3DAYS 1.5 mg  1 patch Transdermal On Call to OR Johnathan Hausen, MD   1.5 mg at 11/27/20 1021   Propoxyphene n-acetaminophen Family History  Problem Relation Age of Onset   Coronary artery disease Other    Hypertension Other    Diabetes Other    Obesity Other    Diabetes Mother    Hypertension Mother    Congestive Heart Failure Mother  Social History:   reports that she has never smoked. She has never used smokeless tobacco. She reports current alcohol use. She reports that she does not use drugs.   REVIEW OF SYSTEMS : Negative except for see problems list  Physical Exam:   Blood pressure (!) 142/82, pulse 79, temperature 97.9 F (36.6 C), temperature source Oral, resp. rate 20, height '5\' 8"'$  (1.727 m), weight (!) 155.9 kg, last menstrual period 04/11/2009, SpO2 97 %. Body mass index is 52.26 kg/m.  Gen:  WDWN AAF NAD  Neurological: Alert and oriented to person, place, and time. Motor and sensory function is grossly intact  Head: Normocephalic and atraumatic.  Eyes: Conjunctivae are normal. Pupils are equal, round, and reactive to light. No scleral icterus.  Neck: Normal range of motion. Neck supple. No tracheal deviation or thyromegaly  present.  Cardiovascular:  SR without murmurs or gallops.  No carotid bruits Breast:  not examied Respiratory: Effort normal.  No respiratory distress. No chest wall tenderness. Breath sounds normal.  No wheezes, rales or rhonchi.  Abdomen:  obese and nontender GU:  palpable tender mass in the left buttock Musculoskeletal: Normal range of motion. Extremities are nontender. No cyanosis, edema or clubbing noted Lymphadenopathy: No cervical, preauricular, postauricular or axillary adenopathy is present Skin: Skin is warm and dry. No rash noted. No diaphoresis. No erythema. No pallor. Pscyh: Normal mood and affect. Behavior is normal. Judgment and thought content normal.   LABORATORY RESULTS: Results for orders placed or performed during the hospital encounter of 11/27/20 (from the past 48 hour(s))  Basic metabolic panel per protocol     Status: Abnormal   Collection Time: 11/25/20 10:57 AM  Result Value Ref Range   Sodium 140 135 - 145 mmol/L   Potassium 3.9 3.5 - 5.1 mmol/L   Chloride 108 98 - 111 mmol/L   CO2 24 22 - 32 mmol/L   Glucose, Bld 228 (H) 70 - 99 mg/dL    Comment: Glucose reference range applies only to samples taken after fasting for at least 8 hours.   BUN 14 8 - 23 mg/dL   Creatinine, Ser 1.11 (H) 0.44 - 1.00 mg/dL   Calcium 8.7 (L) 8.9 - 10.3 mg/dL   GFR, Estimated 57 (L) >60 mL/min    Comment: (NOTE) Calculated using the CKD-EPI Creatinine Equation (2021)    Anion gap 8 5 - 15    Comment: Performed at [facility] [address] [place], [place] [address]     RADIOLOGY RESULTS: No results found.  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   Cervical disc disorder with radiculopathy of cervical  region 02/05/2016   Osteoarthritis of left hip 08/22/2014   Radicular pain in left arm 09/27/2013   Rotator cuff syndrome 06/29/2011   S/P TKR (total knee replacement) 01/17/2011   GOUT, UNSPECIFIED 10/09/2009   HEMORRHOIDS, EXTERNAL 06/17/2009   Depression 04/23/2009   NEPHROLITHIASIS, RECURRENT 04/23/2009   Carpal tunnel syndrome on left 01/26/2009   Disorder of bursae and tendons in shoulder region 09/19/2008   GERD 02/25/2008   Diabetes type 2, uncontrolled ([place]) 04/26/2006   Hyperlipidemia 04/26/2006   ANXIETY STATE NOS 04/26/2006   Obstructive sleep apnea 04/26/2006  MORBID OBESITY 01/11/2006   Essential hypertension 01/11/2006   ALLERGIC RHINITIS 01/11/2006   DYSFUNCTIONAL UTERINE BLEEDING 01/11/2006    Assessment & Plan: Excision of mass     Matt B. Hassell Done, MD, [facility] [phone] beeper [phone]  11/27/2020 10:27 AM

## 2020-11-27 NOTE — Anesthesia Procedure Notes (Addendum)
Procedure Name: Intubation Date/Time: 11/27/2020 10:47 AM Performed by: Ezequiel Kayser, CRNA Pre-anesthesia Checklist: Patient identified, Emergency Drugs available, Suction available and Patient being monitored Patient Re-evaluated:Patient Re-evaluated prior to induction Oxygen Delivery Method: Circle System Utilized Preoxygenation: Pre-oxygenation with 100% oxygen Induction Type: IV induction Ventilation: Mask ventilation without difficulty and Two handed mask ventilation required Laryngoscope Size: Glidescope and 4 Grade View: Grade I Tube type: Oral Tube size: 7.0 mm Number of attempts: 1 Airway Equipment and Method: Stylet and Oral airway Placement Confirmation: ETT inserted through vocal cords under direct vision, positive ETCO2 and breath sounds checked- equal and bilateral Secured at: 23 cm Tube secured with: Tape Dental Injury: Teeth and Oropharynx as per pre-operative assessment

## 2020-11-27 NOTE — Interval H&P Note (Signed)
History and Physical Interval Note:  11/27/2020 10:30 AM  Maria [name]  has presented today for surgery, with the diagnosis of MASS IN LEFT BUTTOCK.  The various methods of treatment have been discussed with the patient and family. After consideration of risks, benefits and other options for treatment, the patient has consented to  Procedure(s): EXCISION OF MASS IN LEFT BUTTOCK (Left) as a surgical intervention.  The patient's history has been reviewed, patient examined, no change in status, stable for surgery.  I have reviewed the patient's chart and labs.  Questions were answered to the patient's satisfaction.     [name]

## 2020-11-30 ENCOUNTER — Encounter (HOSPITAL_BASED_OUTPATIENT_CLINIC_OR_DEPARTMENT_OTHER): Payer: Self-pay | Admitting: Surgery

## 2020-11-30 LAB — SURGICAL PATHOLOGY

## 2020-12-04 ENCOUNTER — Inpatient Hospital Stay
Admission: RE | Admit: 2020-12-04 | Discharge: 2020-12-04 | Disposition: A | Discharge status: Home / Self Care | Payer: PPO | Source: Ambulatory Visit | Attending: Sports Medicine | Admitting: Sports Medicine

## 2020-12-04 NOTE — Discharge Instructions (Signed)

## 2020-12-10 ENCOUNTER — Ambulatory Visit: Payer: PPO

## 2020-12-10 ENCOUNTER — Other Ambulatory Visit: Payer: Self-pay | Admitting: Family Medicine

## 2020-12-10 DIAGNOSIS — M109 Gout, unspecified: Secondary | ICD-10-CM

## 2020-12-12 ENCOUNTER — Inpatient Hospital Stay: Admission: RE | Admit: 2020-12-12 | Payer: PPO | Source: Ambulatory Visit

## 2020-12-16 ENCOUNTER — Ambulatory Visit
Admission: RE | Admit: 2020-12-16 | Discharge: 2020-12-16 | Disposition: A | Discharge status: Home / Self Care | Payer: PPO | Source: Ambulatory Visit | Attending: Sports Medicine | Admitting: Sports Medicine

## 2020-12-16 ENCOUNTER — Other Ambulatory Visit: Payer: Self-pay

## 2020-12-16 DIAGNOSIS — M109 Gout, unspecified: Secondary | ICD-10-CM | POA: Diagnosis not present

## 2020-12-16 DIAGNOSIS — G8929 Other chronic pain: Secondary | ICD-10-CM | POA: Diagnosis not present

## 2020-12-16 DIAGNOSIS — M545 Low back pain, unspecified: Secondary | ICD-10-CM

## 2020-12-16 DIAGNOSIS — R768 Other specified abnormal immunological findings in serum: Secondary | ICD-10-CM | POA: Diagnosis not present

## 2020-12-16 DIAGNOSIS — M47817 Spondylosis without myelopathy or radiculopathy, lumbosacral region: Secondary | ICD-10-CM | POA: Diagnosis not present

## 2020-12-16 DIAGNOSIS — Z79899 Other long term (current) drug therapy: Secondary | ICD-10-CM | POA: Diagnosis not present

## 2020-12-16 MED ORDER — METHYLPREDNISOLONE ACETATE 40 MG/ML INJ SUSP (RADIOLOG
80.0000 mg | Freq: Once | INTRAMUSCULAR | Status: AC
  Administered 2020-12-16: 80 mg via EPIDURAL

## 2020-12-16 MED ORDER — IOPAMIDOL (ISOVUE-M 200) INJECTION 41%
1.0000 mL | Freq: Once | INTRAMUSCULAR | Status: AC
  Administered 2020-12-16: 1 mL via EPIDURAL

## 2020-12-16 NOTE — Discharge Instructions (Signed)

## 2020-12-21 DIAGNOSIS — Z79899 Other long term (current) drug therapy: Secondary | ICD-10-CM | POA: Diagnosis not present

## 2021-01-08 ENCOUNTER — Other Ambulatory Visit: Payer: Self-pay | Admitting: Family Medicine

## 2021-01-08 DIAGNOSIS — M109 Gout, unspecified: Secondary | ICD-10-CM

## 2021-01-22 DIAGNOSIS — G8929 Other chronic pain: Secondary | ICD-10-CM | POA: Diagnosis not present

## 2021-01-22 DIAGNOSIS — R768 Other specified abnormal immunological findings in serum: Secondary | ICD-10-CM | POA: Diagnosis not present

## 2021-01-22 DIAGNOSIS — E538 Deficiency of other specified B group vitamins: Secondary | ICD-10-CM | POA: Diagnosis not present

## 2021-01-22 DIAGNOSIS — M109 Gout, unspecified: Secondary | ICD-10-CM | POA: Diagnosis not present

## 2021-01-22 DIAGNOSIS — E119 Type 2 diabetes mellitus without complications: Secondary | ICD-10-CM | POA: Diagnosis not present

## 2021-01-22 DIAGNOSIS — Z79899 Other long term (current) drug therapy: Secondary | ICD-10-CM | POA: Diagnosis not present

## 2021-01-22 DIAGNOSIS — Z23 Encounter for immunization: Secondary | ICD-10-CM | POA: Diagnosis not present

## 2021-01-22 DIAGNOSIS — M545 Low back pain, unspecified: Secondary | ICD-10-CM | POA: Diagnosis not present

## 2021-01-26 DIAGNOSIS — Z79899 Other long term (current) drug therapy: Secondary | ICD-10-CM | POA: Diagnosis not present

## 2021-02-15 DIAGNOSIS — E669 Obesity, unspecified: Secondary | ICD-10-CM | POA: Diagnosis not present

## 2021-02-15 DIAGNOSIS — M2550 Pain in unspecified joint: Secondary | ICD-10-CM | POA: Diagnosis not present

## 2021-02-15 DIAGNOSIS — E119 Type 2 diabetes mellitus without complications: Secondary | ICD-10-CM | POA: Diagnosis not present

## 2021-02-15 DIAGNOSIS — Z79899 Other long term (current) drug therapy: Secondary | ICD-10-CM | POA: Diagnosis not present

## 2021-03-02 DIAGNOSIS — R0602 Shortness of breath: Secondary | ICD-10-CM | POA: Diagnosis not present

## 2021-03-02 DIAGNOSIS — M2550 Pain in unspecified joint: Secondary | ICD-10-CM | POA: Diagnosis not present

## 2021-03-02 DIAGNOSIS — R1013 Epigastric pain: Secondary | ICD-10-CM | POA: Diagnosis not present

## 2021-03-02 DIAGNOSIS — E119 Type 2 diabetes mellitus without complications: Secondary | ICD-10-CM | POA: Diagnosis not present

## 2021-03-11 DIAGNOSIS — K625 Hemorrhage of anus and rectum: Secondary | ICD-10-CM | POA: Diagnosis not present

## 2021-03-11 DIAGNOSIS — M2550 Pain in unspecified joint: Secondary | ICD-10-CM | POA: Diagnosis not present

## 2021-03-11 DIAGNOSIS — R112 Nausea with vomiting, unspecified: Secondary | ICD-10-CM | POA: Diagnosis not present

## 2021-03-11 DIAGNOSIS — M79609 Pain in unspecified limb: Secondary | ICD-10-CM | POA: Diagnosis not present

## 2021-03-11 DIAGNOSIS — E669 Obesity, unspecified: Secondary | ICD-10-CM | POA: Diagnosis not present

## 2021-03-11 DIAGNOSIS — Z1211 Encounter for screening for malignant neoplasm of colon: Secondary | ICD-10-CM | POA: Diagnosis not present

## 2021-03-11 DIAGNOSIS — Z79899 Other long term (current) drug therapy: Secondary | ICD-10-CM | POA: Diagnosis not present

## 2021-03-15 DIAGNOSIS — R110 Nausea: Secondary | ICD-10-CM | POA: Diagnosis not present

## 2021-03-15 DIAGNOSIS — R112 Nausea with vomiting, unspecified: Secondary | ICD-10-CM | POA: Diagnosis not present

## 2021-03-15 DIAGNOSIS — Z1211 Encounter for screening for malignant neoplasm of colon: Secondary | ICD-10-CM | POA: Diagnosis not present

## 2021-03-15 DIAGNOSIS — E119 Type 2 diabetes mellitus without complications: Secondary | ICD-10-CM | POA: Diagnosis not present

## 2021-03-18 DIAGNOSIS — Z1211 Encounter for screening for malignant neoplasm of colon: Secondary | ICD-10-CM | POA: Diagnosis not present

## 2021-03-18 DIAGNOSIS — K625 Hemorrhage of anus and rectum: Secondary | ICD-10-CM | POA: Diagnosis not present

## 2021-03-19 ENCOUNTER — Other Ambulatory Visit: Payer: Self-pay | Admitting: Family Medicine

## 2021-03-19 DIAGNOSIS — K2270 Barrett's esophagus without dysplasia: Secondary | ICD-10-CM | POA: Diagnosis not present

## 2021-03-19 DIAGNOSIS — M109 Gout, unspecified: Secondary | ICD-10-CM

## 2021-04-03 ENCOUNTER — Other Ambulatory Visit: Payer: Self-pay | Admitting: Family Medicine

## 2021-04-03 DIAGNOSIS — M109 Gout, unspecified: Secondary | ICD-10-CM

## 2021-04-26 ENCOUNTER — Other Ambulatory Visit: Payer: Self-pay

## 2021-04-26 MED ORDER — GABAPENTIN 300 MG PO CAPS: 300.0000 mg | ORAL_CAPSULE | Freq: Three times a day (TID) | ORAL | 2 refills | Status: DC

## 2021-04-26 MED ORDER — METHOCARBAMOL 500 MG PO TABS: 500.0000 mg | ORAL_TABLET | Freq: Two times a day (BID) | ORAL | 0 refills | Status: DC | PRN

## 2021-04-26 NOTE — Progress Notes (Signed)
Pt called requesting refill

## 2021-05-20 ENCOUNTER — Other Ambulatory Visit: Payer: Self-pay | Admitting: Sports Medicine

## 2021-05-20 ENCOUNTER — Ambulatory Visit (INDEPENDENT_AMBULATORY_CARE_PROVIDER_SITE_OTHER): Payer: PPO | Admitting: Sports Medicine

## 2021-05-20 VITALS — BP 129/61 | Ht 68.0 in | Wt 295.0 lb

## 2021-05-20 DIAGNOSIS — M25559 Pain in unspecified hip: Secondary | ICD-10-CM | POA: Diagnosis not present

## 2021-05-20 DIAGNOSIS — M545 Low back pain, unspecified: Secondary | ICD-10-CM

## 2021-05-20 DIAGNOSIS — M25512 Pain in left shoulder: Secondary | ICD-10-CM | POA: Diagnosis not present

## 2021-05-20 DIAGNOSIS — M79605 Pain in left leg: Secondary | ICD-10-CM | POA: Diagnosis not present

## 2021-05-20 MED ORDER — METHYLPREDNISOLONE ACETATE 40 MG/ML IJ SUSP
40.0000 mg | Freq: Once | INTRAMUSCULAR | Status: AC
  Administered 2021-05-20: 40 mg via INTRA_ARTICULAR

## 2021-05-20 NOTE — Progress Notes (Signed)
Patient ID: Arletha Marschke, female   DOB: 1960/06/13, 61 y.o.   MRN: [id number]  Shameika presents today requesting repeat lumbar ESI's and a repeat cortisone injection into her left shoulder.    Her last ESI was on December 16, 2020.  Injection was performed on the left at L4-L5 and resulted in good symptom relief up until recently.  Her symptoms have returned including numbness and pain that radiates down the left leg into the left foot. MRI of the lumbar spine from April 2022 was  reviewed which demonstrates moderate left foraminal stenosis at L4-L5.  I also reviewed an MRI of her left hip done at that same time which showed moderate left hip osteoarthritis.  We will order a repeat lumbar ESI to be done at [facility].  Kae will let me know if her pain persists postinjection.  She is also complaining of some returning left shoulder and neck pain that has responded well in the past to cortisone injections.  Subacromial cortisone injection for the left shoulder performed as below.    Consent obtained and verified. Time-out conducted. Noted no overlying erythema, induration, or other signs of local infection. Skin prepped in a sterile fashion. Topical analgesic spray: Ethyl chloride. Joint: Left shoulder, subacromial Needle: 25-gauge 1.5 inch Completed without difficulty. Meds: 3 cc 1% Xylocaine, 1 cc (40 mg) Depo-Medrol  Advised to call if fevers/chills, erythema, induration, drainage, or persistent bleeding.

## 2021-05-28 ENCOUNTER — Ambulatory Visit: Payer: PPO | Admitting: Plastic Surgery

## 2021-05-28 ENCOUNTER — Encounter: Payer: Self-pay | Admitting: Plastic Surgery

## 2021-05-28 ENCOUNTER — Other Ambulatory Visit: Payer: Self-pay

## 2021-05-28 DIAGNOSIS — M109 Gout, unspecified: Secondary | ICD-10-CM

## 2021-05-28 DIAGNOSIS — G4733 Obstructive sleep apnea (adult) (pediatric): Secondary | ICD-10-CM

## 2021-05-28 DIAGNOSIS — M5010 Cervical disc disorder with radiculopathy, unspecified cervical region: Secondary | ICD-10-CM

## 2021-05-28 DIAGNOSIS — E119 Type 2 diabetes mellitus without complications: Secondary | ICD-10-CM

## 2021-05-28 DIAGNOSIS — Z96653 Presence of artificial knee joint, bilateral: Secondary | ICD-10-CM | POA: Diagnosis not present

## 2021-05-28 DIAGNOSIS — Z9884 Bariatric surgery status: Secondary | ICD-10-CM

## 2021-05-28 NOTE — Progress Notes (Signed)
Patient ID: Maria [name], female    DOB: April 11, 1961, 61 y.o.   MRN: [id number]   Chief Complaint  Patient presents with   Consult    The patient is a 61 year old female here for evaluation of her abdomen.  She is 5 feet 8 inches tall and weighs 323 pounds.  She lost 100 pounds in the past 3 years.  Her beginning weight was 428 pounds.  She underwent a gastric sleeve by Dr. Hassell Done.  She also had a cholecystectomy.  She complains of a lot of back and neck pain.  She has had a lot of rashes and skin breakdown in her skin folds.  She has even had a surgery for what she describes as boils in her abdominal soft tissue.  She is not a smoker.  She does have diabetes.   Review of Systems  Constitutional: Negative.   HENT: Negative.    Eyes: Negative.   Respiratory: Negative.  Negative for chest tightness and shortness of breath.   Cardiovascular: Negative.   Gastrointestinal:  Negative for abdominal pain.  Endocrine: Negative.   Genitourinary: Negative.   Musculoskeletal:  Positive for back pain and neck pain.  Skin:  Positive for color change and rash.  Neurological: Negative.   Hematological: Negative.   Psychiatric/Behavioral: Negative.     Past Medical History:  Diagnosis Date   Allergic rhinitis    Anemia    1980's   Anxiety    Carpal tunnel syndrome, left    pt denies   Depression    DUB (dysfunctional uterine bleeding)    Dysrhythmia    tachycardic   External hemorrhoid    GERD (gastroesophageal reflux disease)    Gout    History of hiatal hernia    History of kidney stones    Hypertension    Morbid obesity ([place])    Multiple thyroid nodules    Obesity    Osteoarthritis, knee    PE (pulmonary thromboembolism) ([place])    Pneumonia    Recurrent UTI    Rotator cuff syndrome of left shoulder    Type II diabetes mellitus ([place])     Past Surgical History:  Procedure Laterality Date   CHOLECYSTECTOMY     HERNIA REPAIR     INCISION AND DRAINAGE ABSCESS N/A 06/01/2014    Procedure: INCISION AND DRAINAGE ABSCESS;  Surgeon: Rolm Bookbinder, MD;  Location: [facility];  Service: General;  Laterality: N/A;   IR GENERIC HISTORICAL  05/18/2016   IR EPIDUROGRAPHY 05/18/2016 San Morelle, MD [facility]   JOINT REPLACEMENT     LAPAROSCOPIC GASTRIC SLEEVE RESECTION WITH HIATAL HERNIA REPAIR N/A 07/26/2016   Procedure: LAPAROSCOPIC GASTRIC SLEEVE RESECTION WITH HIATAL HERNIA REPAIR, UPPER ENDOSCOPY;  Surgeon: Johnathan Hausen, MD;  [facility];  Service: General;  Laterality: N/A;   LITHOTRIPSY     MASS EXCISION Left 11/27/2020   Procedure: EXCISION OF MASS IN LEFT BUTTOCK;  Surgeon: Johnathan Hausen, MD;  Location: [facility];  Service: General;  Laterality: Left;   PULMONARY THROMBECTOMY N/A 03/19/2018   Procedure: PULMONARY THROMBECTOMY;  Surgeon: Algernon Huxley, MD;  Location: [facility];  Service: Cardiovascular;  Laterality: N/A;   RADIOACTIVE SEED GUIDED EXCISIONAL BREAST BIOPSY Left 01/11/2019   Procedure: RADIOACTIVE SEED GUIDED OF 2 SITES EXCISIONAL LEFT BREAST BIOPSY;  Surgeon: Johnathan Hausen, MD;  Location: [facility];  Service: General;  Laterality: Left;   REPLACEMENT TOTAL KNEE BILATERAL     TUBAL LIGATION  Current Outpatient Medications:    atorvastatin (LIPITOR) 20 MG tablet, Take 20 mg by mouth every evening., Disp: , Rfl:    benazepril (LOTENSIN) 40 MG tablet, Take 40 mg by mouth every evening. , Disp: , Rfl:    Colchicine 0.6 MG CAPS, TAKE 1 CAPSULE BY MOUTH 1-2 TIMES A DAY FOR 3 DAYS FOR GOUT FLARE AND 1 CAP DAILY FOR PROPHYLAXIS, Disp: 135 capsule, Rfl: 1   furosemide (LASIX) 20 MG tablet, Take 20 mg by mouth every evening. , Disp: , Rfl:    gabapentin (NEURONTIN) 300 MG capsule, Take 1 capsule (300 mg total) by mouth 3 (three) times daily., Disp: 90 capsule, Rfl: 2   glipiZIDE (GLUCOTROL) 5 MG tablet, Take 5 mg by mouth every evening. , Disp: , Rfl: 5   loperamide (IMODIUM A-D) 2 MG tablet, Take 4 mg by mouth 4 (four)  times daily as needed for diarrhea or loose stools., Disp: , Rfl:    metFORMIN (GLUCOPHAGE) 850 MG tablet, Take 850 mg by mouth 2 (two) times daily. , Disp: , Rfl: 5   methocarbamol (ROBAXIN) 500 MG tablet, Take 1 tablet (500 mg total) by mouth 2 (two) times daily as needed for muscle spasms., Disp: 30 tablet, Rfl: 0   metoprolol tartrate (LOPRESSOR) 100 MG tablet, Take 200 mg by mouth every evening., Disp: , Rfl:    naloxone (NARCAN) 4 MG/0.1ML LIQD nasal spray kit, Place 1 spray into the nose as needed (opioid overdose)., Disp: , Rfl:    oxyCODONE (ROXICODONE) 15 MG immediate release tablet, Take 1 tablet (15 mg total) by mouth every 6 (six) hours as needed for pain., Disp: 15 tablet, Rfl: 0   pantoprazole (PROTONIX) 40 MG tablet, Take 40 mg by mouth every evening. , Disp: , Rfl:    Semaglutide, 1 MG/DOSE, (OZEMPIC, 1 MG/DOSE,) 2 MG/1.5ML SOPN, Inject 1 mg into the skin every Monday., Disp: , Rfl:    Vitamin D, Ergocalciferol, (DRISDOL) 1.25 MG (50000 UT) CAPS capsule, Take 50,000 Units by mouth every Monday., Disp: , Rfl:    Objective:   Vitals:   05/28/21 0826  BP: 127/86  Pulse: 86  SpO2: 97%    Physical Exam Vitals reviewed.  Constitutional:      Appearance: Normal appearance.  HENT:     Head: Normocephalic and atraumatic.  Cardiovascular:     Rate and Rhythm: Normal rate.     Pulses: Normal pulses.  Pulmonary:     Effort: Pulmonary effort is normal.  Abdominal:     General: There is no distension.     Palpations: Abdomen is soft. There is mass.     Tenderness: There is no abdominal tenderness. There is no guarding.  Musculoskeletal:        General: No swelling or deformity.  Skin:    General: Skin is warm.     Coloration: Skin is not jaundiced.     Findings: No bruising.  Neurological:     Mental Status: She is oriented to person, place, and time.  Psychiatric:        Mood and Affect: Mood normal.        Behavior: Behavior normal.        Thought Content: Thought  content normal.        Judgment: Judgment normal.    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  I also think we need to see if there is anything that can be done with the sleeve to help her lose more weight prior to the surgery.  We will send her for physical therapy and she knows to call when she has finished. I also consulted with Dr. Hassell Done to see if we can do anything to help her with weight loss.  He is going to see her and we will get that set up. [place], DO

## 2021-06-03 ENCOUNTER — Other Ambulatory Visit: Payer: Self-pay | Admitting: Sports Medicine

## 2021-06-08 ENCOUNTER — Inpatient Hospital Stay
Admission: RE | Admit: 2021-06-08 | Discharge: 2021-06-08 | Disposition: A | Discharge status: Home / Self Care | Payer: PPO | Source: Ambulatory Visit | Attending: Sports Medicine | Admitting: Sports Medicine

## 2021-06-08 NOTE — Discharge Instructions (Signed)

## 2021-06-09 ENCOUNTER — Other Ambulatory Visit: Payer: Self-pay

## 2021-06-09 ENCOUNTER — Other Ambulatory Visit: Payer: Self-pay | Admitting: Sports Medicine

## 2021-06-09 ENCOUNTER — Ambulatory Visit
Admission: RE | Admit: 2021-06-09 | Discharge: 2021-06-09 | Disposition: A | Discharge status: Home / Self Care | Payer: PPO | Source: Ambulatory Visit | Attending: Sports Medicine | Admitting: Sports Medicine

## 2021-06-09 DIAGNOSIS — M25559 Pain in unspecified hip: Secondary | ICD-10-CM

## 2021-06-09 DIAGNOSIS — M545 Low back pain, unspecified: Secondary | ICD-10-CM

## 2021-06-09 MED ORDER — IOPAMIDOL (ISOVUE-M 200) INJECTION 41%
1.0000 mL | Freq: Once | INTRAMUSCULAR | Status: AC
  Administered 2021-06-09: 1 mL via EPIDURAL

## 2021-06-09 MED ORDER — METHYLPREDNISOLONE ACETATE 40 MG/ML INJ SUSP (RADIOLOG
80.0000 mg | Freq: Once | INTRAMUSCULAR | Status: AC
  Administered 2021-06-09: 80 mg via EPIDURAL

## 2021-06-09 NOTE — Progress Notes (Signed)
Updated order faxed to [facility] for pt's injection today. Unable to complete lumbar ESI, had to do a left L4 nerve root block instead. ?

## 2021-06-09 NOTE — Discharge Instructions (Signed)

## 2021-06-10 ENCOUNTER — Other Ambulatory Visit: Payer: Self-pay

## 2021-06-10 MED ORDER — METHOCARBAMOL 500 MG PO TABS: 500.0000 mg | ORAL_TABLET | Freq: Three times a day (TID) | ORAL | 0 refills | Status: DC | PRN

## 2021-06-23 ENCOUNTER — Ambulatory Visit: Payer: PPO

## 2021-06-23 NOTE — Therapy (Incomplete)
?OUTPATIENT PHYSICAL THERAPY THORACOLUMBAR EVALUATION ? ? ?Patient Name: Maria [name] ?MRN: [id number] ?DOB:[birth year], 61 y.o., female ?Today's Date: 06/23/2021 ? ? ? ?Past Medical History:  ?Diagnosis Date  ? Allergic rhinitis   ? Anemia   ? 1980's  ? Anxiety   ? Carpal tunnel syndrome, left   ? pt denies  ? Depression   ? DUB (dysfunctional uterine bleeding)   ? Dysrhythmia   ? tachycardic  ? External hemorrhoid   ? GERD (gastroesophageal reflux disease)   ? Gout   ? History of hiatal hernia   ? History of kidney stones   ? Hypertension   ? Morbid obesity ([place])   ? Multiple thyroid nodules   ? Obesity   ? Osteoarthritis, knee   ? PE (pulmonary thromboembolism) ([place])   ? Pneumonia   ? Recurrent UTI   ? Rotator cuff syndrome of left shoulder   ? Type II diabetes mellitus ([place])   ? ?Past Surgical History:  ?Procedure Laterality Date  ? CHOLECYSTECTOMY    ? HERNIA REPAIR    ? INCISION AND DRAINAGE ABSCESS N/A 06/01/2014  ? Procedure: INCISION AND DRAINAGE ABSCESS;  Surgeon: Rolm Bookbinder, MD;  Location: [facility];  Service: General;  Laterality: N/A;  ? IR GENERIC HISTORICAL  05/18/2016  ? IR EPIDUROGRAPHY 05/18/2016 San Morelle, MD [facility]  ? JOINT REPLACEMENT    ? LAPAROSCOPIC GASTRIC SLEEVE RESECTION WITH HIATAL HERNIA REPAIR N/A 07/26/2016  ? Procedure: LAPAROSCOPIC GASTRIC SLEEVE RESECTION WITH HIATAL HERNIA REPAIR, UPPER ENDOSCOPY;  Surgeon: Johnathan Hausen, MD;  Location: [facility];  Service: General;  Laterality: N/A;  ? LITHOTRIPSY    ? MASS EXCISION Left 11/27/2020  ? Procedure: EXCISION OF MASS IN LEFT BUTTOCK;  Surgeon: Johnathan Hausen, MD;  Location: [facility];  Service: General;  Laterality: Left;  ? PULMONARY THROMBECTOMY N/A 03/19/2018  ? Procedure: PULMONARY THROMBECTOMY;  Surgeon: Algernon Huxley, MD;  Location: [facility];  Service: Cardiovascular;  Laterality: N/A;  ? RADIOACTIVE SEED GUIDED EXCISIONAL BREAST BIOPSY Left 01/11/2019  ? Procedure: RADIOACTIVE SEED GUIDED OF 2  SITES EXCISIONAL LEFT BREAST BIOPSY;  Surgeon: Johnathan Hausen, MD;  Location: [facility];  Service: General;  Laterality: Left;  ? REPLACEMENT TOTAL KNEE BILATERAL    ? TUBAL LIGATION    ? ?Patient Active Problem List  ? Diagnosis Date Noted  ? Hypertension   ? Type II diabetes mellitus ([place])   ? Obesity   ? Closed fracture of fourth toe of left foot 10/17/2019  ? Achilles tendinitis of right lower extremity 12/23/2017  ? 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 Cervical disc disorder with radiculopathy of cervical region 02/05/2016  ? Osteoarthritis of left hip 08/22/2014  ? Radicular pain in left arm 09/27/2013  ? Rotator cuff syndrome 06/29/2011  ? S/P TKR (total knee replacement) 01/17/2011  ? GOUT, UNSPECIFIED 10/09/2009  ? HEMORRHOIDS, EXTERNAL 06/17/2009  ? Depression 04/23/2009  ? NEPHROLITHIASIS, RECURRENT 04/23/2009  ? Carpal tunnel syndrome on left 01/26/2009  ? Disorder of bursae and tendons in shoulder region 09/19/2008  ? GERD 02/25/2008  ? Diabetes type 2, uncontrolled 04/26/2006  ? Hyperlipidemia 04/26/2006  ? ANXIETY STATE NOS 04/26/2006  ? Obstructive sleep apnea 04/26/2006  ? MORBID OBESITY 01/11/2006  ? Essential hypertension 01/11/2006  ? ALLERGIC RHINITIS 01/11/2006  ? DYSFUNCTIONAL UTERINE BLEEDING 01/11/2006  ? ? ?PCP: Antonietta Jewel, MD ? ?REFERRING PROVIDER: Wallace Going, DO ? ?THERAPY DIAG:  ?No diagnosis found. ? ?REFERRING DIAG:  Morbid (severe) obesity due to excess calories [E66.01], Bariatric surgery status [Z98.84], Presence of artificial knee joint, bilateral [Z96.653],  ? ?SUBJECTIVE: ? ?PERTINENT PAST HISTORY:  ?Tachycardia, DM TII, bariatric surgery   ?     ?PRECAUTIONS: {Therapy precautions:24002} ? ?WEIGHT BEARING RESTRICTIONS {Yes ***/No:24003} ? ?FALLS:  ?Has patient fallen in last 6 months? {yes/no:20286}, Number of falls: *** ? ?LIVING ENVIRONMENT: ?Lives with: {[facility] lives with:25569::"lives with their family"} ?Stairs: {yes/no:20286};  {Stairs:24000} ? ?MOI/History of condition: ? ?Onset date: *** ? ?Maria [name] is a 61 y.o. female who presents to clinic with chief complaint of ***.  *** ? ?From referring provider:  ? ?"Recommend panniculectomy.  I also think we need to see if there is anything that can be done with the sleeve to help her lose more weight prior to the surgery.  We will send her for physical therapy and she knows to call when she has finished. ?I also consulted with Dr. Hassell Done to see if we can do anything to help her with weight loss.  He is going to see her and we will get that set up." ? ? Red flags:  ?{has/denies:26543} {kerredflag:26542} ? ?Pain:  ?Are you having pain? {yes/no:20286} ?Pain location: *** ?NPRS scale:  ?current {NUMBERS; 0-10:5044}/10  ?average {NUMBERS; 0-10:5044}/10  ?Aggravating factors: *** ? NPRS, highest: {NUMBERS; 0-10:5044}/10 ?Relieving factors: *** ? NPRS: best: {NUMBERS; 0-10:5044}/10 ?Pain description: {PAIN DESCRIPTION:21022940} ?Stage: {Desc; acute/subacute/chronic:13799} ?Stability: {kerbetterworse:26715} ?24 hour pattern: ***  ? ?Occupation: *** ? ?Assistive Device: *** ? ?Hand Dominance: *** ? ?Patient Goals/Specific Activities: *** ? ? ?PLOF: {PLOF:24004} ? ?DIAGNOSTIC FINDINGS: *** ? ? ?OBJECTIVE:  ? ?GENERAL OBSERVATION/GAIT: ? *** ? ?SENSATION: ? Light touch: {intact/deficits:24005} ? ?MUSCLE LENGTH: ?Hamstrings: Right {kerminsig:27227} restriction; Left {kerminsig:27227} restriction ?ASLR: Right {keraslr:27228}; Left {keraslr:27228} ?Thomas test: Right {kerminsig:27227} restriction; Left {kerminsig:27227} restriction ?Ely's test: Right {kerminsig:27227} restriction; Left {kerminsig:27227} restriction ? ? ? ?LUMBAR AROM ? ?AROM AROM  ?06/23/2021  ?Flexion {kerromcxlx:26716}  ?Extension {kerromcxlx:26716}  ?Right lateral flexion {kerromcxlx:26716}  ?Left lateral flexion {kerromcxlx:26716}  ?Right rotation {kerromcxlx:26716}  ?Left rotation {kerromcxlx:26716}  ?  ?(Blank rows = not  tested) ? ?DIRECTIONAL PREFERENCE: ? *** ? ?LE MMT: ? ?MMT Right ?06/23/2021 Left ?06/23/2021  ?Hip flexion (L2, L3)    ?Knee extension (L3)    ?Knee flexion    ?Hip abduction    ?Hip extension    ?Hip external rotation    ?Hip internal rotation    ?Hip adduction    ?Ankle dorsiflexion (L4)    ?Ankle plantarflexion (S1)    ?Ankle inversion    ?Ankle eversion    ?Great Toe ext (L5)    ?Grossly    ? ?(Blank rows = not tested, score listed is out of 5 possible points.  N = WNL, D = diminished, C = clear for gross weakness with myotome testing, * = concordant pain with testing) ? ? ?LE ROM: ? ?ROM Right ?06/23/2021 Left ?06/23/2021  ?Hip flexion    ?Hip extension    ?Hip abduction    ?Hip adduction    ?Hip internal rotation    ?Hip external rotation    ?Knee flexion    ?Knee extension    ?Ankle dorsiflexion    ?Ankle plantarflexion    ?Ankle inversion    ?Ankle eversion    ? ? (Blank rows = not tested, N = WNL, * = concordant pain with testing) ? ?Functional Tests ? ?Functional tests Eval (06/23/2021)   ?    ?    ?    ?    ?    ?    ?    ?    ?    ?    ?    ?    ?    ?    ? ? ?  LUMBAR SPECIAL TESTS:  ?Straight leg raise: L (***), R (***) ?Slump: L (***), R (***) ? ? PALPATION:  ? *** ? ? SPINAL SEGMENTAL MOBILITY ASSESSMENT:  ?*** ? ?PATIENT SURVEYS:  ?{rehab surveys:24030} ? ? ?TODAY'S TREATMENT  ?*** ? ?PATIENT EDUCATION:  ?POC, diagnosis, prognosis, HEP, and outcome measures.  Pt educated via explanation, demonstration, and handout (HEP).  Pt confirms understanding verbally.  ? ? ?HOME EXERCISE PROGRAM: ?*** ? ?ASSESSMENT: ? ?CLINICAL IMPRESSION: ?Tyrena is a 61 y.o. female who presents to clinic with signs and sxs consistent with ***.   ? ?OBJECTIVE IMPAIRMENTS: Pain, *** ? ?ACTIVITY LIMITATIONS: *** ? ?PERSONAL FACTORS: See medical history and pertinent history ? ? ?REHAB POTENTIAL: {rehabpotential:25112} ? ?CLINICAL DECISION MAKING: {clinical decision making:25114} ? ?EVALUATION COMPLEXITY: {Evaluation  complexity:25115} ? ? ?GOALS: ? ? ?SHORT TERM GOALS: ? ?Ashley will be >75% HEP compliant to improve carryover between sessions and facilitate independent management of condition ? ?Evaluation (06/23/2021): ongoing ?Target date: 08/18/2021 ?G

## 2021-06-23 NOTE — Therapy (Incomplete)
?OUTPATIENT PHYSICAL THERAPY THORACOLUMBAR EVALUATION ? ? ?Patient Name: Maria [name] ?MRN: [id number] ?DOB:22-Jun-1960, 61 y.o., female ?Today's Date: 06/23/2021 ? ? ? ?Past Medical History:  ?Diagnosis Date  ? Allergic rhinitis   ? Anemia   ? 1980's  ? Anxiety   ? Carpal tunnel syndrome, left   ? pt denies  ? Depression   ? DUB (dysfunctional uterine bleeding)   ? Dysrhythmia   ? tachycardic  ? External hemorrhoid   ? GERD (gastroesophageal reflux disease)   ? Gout   ? History of hiatal hernia   ? History of kidney stones   ? Hypertension   ? Morbid obesity ([place])   ? Multiple thyroid nodules   ? Obesity   ? Osteoarthritis, knee   ? PE (pulmonary thromboembolism) ([place])   ? Pneumonia   ? Recurrent UTI   ? Rotator cuff syndrome of left shoulder   ? Type II diabetes mellitus ([place])   ? ?Past Surgical History:  ?Procedure Laterality Date  ? CHOLECYSTECTOMY    ? HERNIA REPAIR    ? INCISION AND DRAINAGE ABSCESS N/A 06/01/2014  ? Procedure: INCISION AND DRAINAGE ABSCESS;  Surgeon: Rolm Bookbinder, MD;  Location: [facility];  Service: General;  Laterality: N/A;  ? IR GENERIC HISTORICAL  05/18/2016  ? IR EPIDUROGRAPHY 05/18/2016 San Morelle, MD [facility]  ? JOINT REPLACEMENT    ? LAPAROSCOPIC GASTRIC SLEEVE RESECTION WITH HIATAL HERNIA REPAIR N/A 07/26/2016  ? Procedure: LAPAROSCOPIC GASTRIC SLEEVE RESECTION WITH HIATAL HERNIA REPAIR, UPPER ENDOSCOPY;  Surgeon: Johnathan Hausen, MD;  Location[facility];  Service: General;  Laterality: N/A;  ? LITHOTRIPSY    ? MASS EXCISION Left 11/27/2020  ? Procedure: EXCISION OF MASS IN LEFT BUTTOCK;  Surgeon: Johnathan Hausen, MD;  Location: [facility];  Service: General;  Laterality: Left;  ? PULMONARY THROMBECTOMY N/A 03/19/2018  ? Procedure: PULMONARY THROMBECTOMY;  Surgeon: Algernon Huxley, MD;  Location: [facility];  Service: Cardiovascular;  Laterality: N/A;  ? RADIOACTIVE SEED GUIDED EXCISIONAL BREAST BIOPSY Left 01/11/2019  ? Procedure: RADIOACTIVE SEED GUIDED OF 2  SITES EXCISIONAL LEFT BREAST BIOPSY;  Surgeon: Johnathan Hausen, MD;  Location: [facility];  Service: General;  Laterality: Left;  ? REPLACEMENT TOTAL KNEE BILATERAL    ? TUBAL LIGATION    ? ?Patient Active Problem List  ? Diagnosis Date Noted  ? Hypertension   ? Type II diabetes mellitus ([place])   ? Obesity   ? Closed fracture of fourth toe of left foot 10/17/2019  ? Achilles tendinitis of right lower extremity 12/23/2017  ? 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 Cervical disc disorder with radiculopathy of cervical region 02/05/2016  ? Osteoarthritis of left hip 08/22/2014  ? Radicular pain in left arm 09/27/2013  ? Rotator cuff syndrome 06/29/2011  ? S/P TKR (total knee replacement) 01/17/2011  ? GOUT, UNSPECIFIED 10/09/2009  ? HEMORRHOIDS, EXTERNAL 06/17/2009  ? Depression 04/23/2009  ? NEPHROLITHIASIS, RECURRENT 04/23/2009  ? Carpal tunnel syndrome on left 01/26/2009  ? Disorder of bursae and tendons in shoulder region 09/19/2008  ? GERD 02/25/2008  ? Diabetes type 2, uncontrolled 04/26/2006  ? Hyperlipidemia 04/26/2006  ? ANXIETY STATE NOS 04/26/2006  ? Obstructive sleep apnea 04/26/2006  ? MORBID OBESITY 01/11/2006  ? Essential hypertension 01/11/2006  ? ALLERGIC RHINITIS 01/11/2006  ? DYSFUNCTIONAL UTERINE BLEEDING 01/11/2006  ? ? ?PCP: Antonietta Jewel, MD ? ?REFERRING PROVIDER: Wallace Going, DO ? ?REFERRING DIAG:  ?E66.01 (ICD-10-CM) - Morbid (severe) obesity  due to excess calories ?Z98.84 (ICD-10-CM) - Bariatric surgery status ?Z96.653 (ICD-10-CM) - Presence of artificial knee joint, bilateral ?M10.9 (ICD-10-CM) - Gout, unspecified ?M50.10 (ICD-10-CM) - Cervical disc disorder with radiculopathy, unspecified cervical region ?E11.9 (ICD-10-CM) - Type 2 diabetes mellitus without complications ?G47.33 (ICD-10-CM) - Obstructive sleep apnea (adult) (pediatric) ? ? ? ?THERAPY DIAG:  ?No diagnosis found. ? ?ONSET DATE: *** ? ?SUBJECTIVE:                                                                                                                                                                                           ? ?SUBJECTIVE STATEMENT: ?*** ? ?PERTINENT HISTORY:  ?*** ? ?PAIN:  ?Are you having pain? Yes: {yespain:27235::"NPRS scale: ***/10","Pain location: ***","Pain description: ***","Aggravating factors: ***","Relieving factors: ***"} ? ? ?PRECAUTIONS: {Therapy precautions:24002} ? ?WEIGHT BEARING RESTRICTIONS {Yes ***/No:24003} ? ?FALLS:  ?Has patient fallen in last 6 months? {yes/no:20286}, Number of falls: *** ? ?LIVING ENVIRONMENT: ?Lives with: {[facility] lives with:25569::"lives with their family"} ?Lives in: {Lives in:25570} ?Stairs: {yes/no:20286}; {Stairs:24000} ?Has following equipment at home: {Assistive devices:23999} ? ?OCCUPATION: *** ? ?PLOF: {PLOF:24004} ? ?PATIENT GOALS *** ? ? ?OBJECTIVE:  ? ?DIAGNOSTIC FINDINGS:  ?*** ? ?PATIENT SURVEYS:  ?{rehab surveys:24030} ? ?SCREENING FOR RED FLAGS: ?Bowel or bladder incontinence: {Yes/No:304960894} ?Spinal tumors: {Yes/No:304960894} ?Cauda equina syndrome: {Yes/No:304960894} ?Compression fracture: {Yes/No:304960894} ?Abdominal aneurysm: {Yes/No:304960894} ? ?COGNITION: ? Overall cognitive status: {cognition:24006}   ?  ?SENSATION: ?{sensation:27233} ? ?MUSCLE LENGTH: ?Hamstrings: Right *** deg; Left *** deg ?Thomas test: Right *** deg; Left *** deg ? ?POSTURE:  ?*** ? ?PALPATION: ?*** ? ?LUMBAR ROM:  ? ?{AROM/PROM:27142}  A/PROM  ?06/23/2021  ?Flexion   ?Extension   ?Right lateral flexion   ?Left lateral flexion   ?Right rotation   ?Left rotation   ? (Blank rows = not tested) ? ?LE ROM: ? ?{AROM/PROM:27142}  Right ?06/23/2021 Left ?06/23/2021  ?Hip flexion    ?Hip extension    ?Hip abduction    ?Hip adduction    ?Hip internal rotation    ?Hip external rotation    ?Knee flexion    ?Knee extension    ?Ankle dorsiflexion    ?Ankle plantarflexion    ?Ankle inversion    ?Ankle eversion    ? (Blank rows = not tested) ? ?LE MMT: ? ?MMT Right ?06/23/2021  Left ?06/23/2021  ?Hip flexion    ?Hip extension    ?Hip abduction    ?Hip adduction    ?Hip internal rotation    ?Hip external rotation    ?Knee flexion    ?Knee extension    ?Ankle dorsiflexion    ?Ankle plantarflexion    ?  Ankle inversion    ?Ankle eversion    ? (Blank rows = not tested) ? ?LUMBAR SPECIAL TESTS:  ?{lumbar special test:25242} ? ?FUNCTIONAL TESTS:  ?{Functional tests:24029} ? ?GAIT: ?Distance walked: *** ?Assistive device utilized: {Assistive devices:23999} ?Level of assistance: {Levels of assistance:24026} ?Comments: *** ? ? ? ?TODAY'S TREATMENT  ?*** ? ? ?PATIENT EDUCATION:  ?Education details: *** ?Person educated: {Person educated:25204} ?Education method: {Education Method:25205} ?Education comprehension: {Education Comprehension:25206} ? ? ?HOME EXERCISE PROGRAM: ?*** ? ?ASSESSMENT: ? ?CLINICAL IMPRESSION: ?Patient is a *** y.o. *** who was seen today for physical therapy evaluation and treatment for ***.  ? ? ?OBJECTIVE IMPAIRMENTS {opptimpairments:25111}.  ? ?ACTIVITY LIMITATIONS {activity limitations:25113}.  ? ?PERSONAL FACTORS {Personal factors:25162} are also affecting patient's functional outcome.  ? ? ?REHAB POTENTIAL: {rehabpotential:25112} ? ?CLINICAL DECISION MAKING: {clinical decision making:25114} ? ?EVALUATION COMPLEXITY: {Evaluation complexity:25115} ? ? ?GOALS: ?Goals reviewed with patient? {yes/no:20286} ? ?SHORT TERM GOALS: Target date: {follow up:25551} ? ?*** ?Baseline: *** ?Goal status: {GOALSTATUS:25110} ? ?2.  *** ?Baseline: *** ?Goal status: {GOALSTATUS:25110} ? ?3.  *** ?Baseline: *** ?Goal status: {GOALSTATUS:25110} ? ?4.  *** ?Baseline: *** ?Goal status: {GOALSTATUS:25110} ? ?5.  *** ?Baseline: *** ?Goal status: {GOALSTATUS:25110} ? ?6.  *** ?Baseline: *** ?Goal status: {GOALSTATUS:25110} ? ?LONG TERM GOALS: Target date: {follow up:25551} ? ?*** ?Baseline: *** ?Goal status: {GOALSTATUS:25110} ? ?2.  *** ?Baseline: *** ?Goal status: {GOALSTATUS:25110} ? ?3.   *** ?Baseline: *** ?Goal status: {GOALSTATUS:25110} ? ?4.  *** ?Baseline: *** ?Goal status: {GOALSTATUS:25110} ? ?5.  *** ?Baseline: *** ?Goal status: {GOALSTATUS:25110} ? ?6.  *** ?Baseline: *** ?Goal status:

## 2021-06-24 ENCOUNTER — Ambulatory Visit: Payer: PPO | Attending: Plastic Surgery | Admitting: Physical Therapy

## 2021-06-24 DIAGNOSIS — R262 Difficulty in walking, not elsewhere classified: Secondary | ICD-10-CM | POA: Insufficient documentation

## 2021-07-02 ENCOUNTER — Ambulatory Visit: Payer: PPO | Admitting: Physical Therapy

## 2021-07-02 ENCOUNTER — Other Ambulatory Visit: Payer: Self-pay

## 2021-07-02 DIAGNOSIS — R262 Difficulty in walking, not elsewhere classified: Secondary | ICD-10-CM | POA: Diagnosis present

## 2021-07-02 NOTE — Therapy (Signed)
?OUTPATIENT PHYSICAL THERAPY THORACOLUMBAR EVALUATION ? ? ?Patient Name: Maria [name] ?MRN: [id number] ?DOB:29-Jul-1960, 61 y.o., female ?Today's Date: 07/02/2021 ? ? PT End of Session - 07/02/21 1024   ? ? Visit Number 1   ? Number of Visits 1   ? Date for PT Re-Evaluation 07/02/21   ? Authorization Type [employer]   ? PT Start Time 1015   ? PT Stop Time 1100   ? PT Time Calculation (min) 45 min   ? Activity Tolerance Patient limited by fatigue;Patient tolerated treatment well   ? Behavior During Therapy [facility] for tasks assessed/performed   ? ?  ?  ? ?  ? ? ?Past Medical History:  ?Diagnosis Date  ? Allergic rhinitis   ? Anemia   ? 1980's  ? Anxiety   ? Carpal tunnel syndrome, left   ? pt denies  ? Depression   ? DUB (dysfunctional uterine bleeding)   ? Dysrhythmia   ? tachycardic  ? External hemorrhoid   ? GERD (gastroesophageal reflux disease)   ? Gout   ? History of hiatal hernia   ? History of kidney stones   ? Hypertension   ? Morbid obesity ([place])   ? Multiple thyroid nodules   ? Obesity   ? Osteoarthritis, knee   ? PE (pulmonary thromboembolism) ([place])   ? Pneumonia   ? Recurrent UTI   ? Rotator cuff syndrome of left shoulder   ? Type II diabetes mellitus ([place])   ? ?Past Surgical History:  ?Procedure Laterality Date  ? CHOLECYSTECTOMY    ? HERNIA REPAIR    ? INCISION AND DRAINAGE ABSCESS N/A 06/01/2014  ? Procedure: INCISION AND DRAINAGE ABSCESS;  Surgeon: Rolm Bookbinder, MD;  Location: [place];  Service: General;  Laterality: N/A;  ? IR GENERIC HISTORICAL  05/18/2016  ? IR EPIDUROGRAPHY 05/18/2016 [place] Morelle, MD [facility]  ? JOINT REPLACEMENT    ? LAPAROSCOPIC GASTRIC SLEEVE RESECTION WITH HIATAL HERNIA REPAIR N/A 07/26/2016  ? Procedure: LAPAROSCOPIC GASTRIC SLEEVE RESECTION WITH HIATAL HERNIA REPAIR, UPPER ENDOSCOPY;  Surgeon: Johnathan Hausen, MD;  Location: [facility];  Service: General;  Laterality: N/A;  ? LITHOTRIPSY    ? MASS EXCISION Left 11/27/2020  ? Procedure: EXCISION OF MASS IN LEFT  BUTTOCK;  Surgeon: Johnathan Hausen, MD;  Location: [place];  Service: General;  Laterality: Left;  ? PULMONARY THROMBECTOMY N/A 03/19/2018  ? Procedure: PULMONARY THROMBECTOMY;  Surgeon: Algernon Huxley, MD;  Location: [place] [facility];  Service: Cardiovascular;  Laterality: N/A;  ? RADIOACTIVE SEED GUIDED EXCISIONAL BREAST BIOPSY Left 01/11/2019  ? Procedure: RADIOACTIVE SEED GUIDED OF 2 SITES EXCISIONAL LEFT BREAST BIOPSY;  Surgeon: Johnathan Hausen, MD;  Location: [place];  Service: General;  Laterality: Left;  ? REPLACEMENT TOTAL KNEE BILATERAL    ? TUBAL LIGATION    ? ?Patient Active Problem List  ? Diagnosis Date Noted  ? Hypertension   ? Type II diabetes mellitus ([place])   ? Obesity   ? Closed fracture of fourth toe of left foot 10/17/2019  ? Achilles tendinitis of right lower extremity 12/23/2017  ? 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 Cervical disc disorder with radiculopathy of cervical region 02/05/2016  ? Osteoarthritis of left hip 08/22/2014  ? Radicular pain in left arm 09/27/2013  ? Rotator cuff syndrome 06/29/2011  ? S/P TKR (total knee replacement) 01/17/2011  ? GOUT, UNSPECIFIED 10/09/2009  ? HEMORRHOIDS, EXTERNAL 06/17/2009  ? Depression 04/23/2009  ? NEPHROLITHIASIS, RECURRENT  04/23/2009  ? Carpal tunnel syndrome on left 01/26/2009  ? Disorder of bursae and tendons in shoulder region 09/19/2008  ? GERD 02/25/2008  ? Diabetes type 2, uncontrolled 04/26/2006  ? Hyperlipidemia 04/26/2006  ? ANXIETY STATE NOS 04/26/2006  ? Obstructive sleep apnea 04/26/2006  ? MORBID OBESITY 01/11/2006  ? Essential hypertension 01/11/2006  ? ALLERGIC RHINITIS 01/11/2006  ? DYSFUNCTIONAL UTERINE BLEEDING 01/11/2006  ? ? ?PCP: Antonietta Jewel, MD ? ?REFERRING PROVIDER: Wallace Going, DO ? ?REFERRING DIAG: E66.01 (ICD-10-CM) - Morbid (severe) obesity due to excess calories Z98.84 (ICD-10-CM) - Bariatric surgery status Z96.653 (ICD-10-CM) - Presence of artificial knee joint,  bilateral M10.9 (ICD-10-CM) - Gout, unspecified M50.10 (ICD-10-CM) - Cervical disc disorder with radiculopathy, unspecified cervical region E11.9 (ICD-10-CM) - Type 2 diabetes mellitus without complications W11.91 (YNW-29-FA) - Obstructive sleep apnea (adult) (pediatric)  ? ?THERAPY DIAG:  ?Difficulty in walking, not elsewhere classified ? ?ONSET DATE: chronic  ? ?SUBJECTIVE:                                                                                                                                                                                          ? ?SUBJECTIVE STATEMENT: ?Patient is here as a precursor to surgery for abdominal tissue.  She has back pain, knee and hip pain as well.  She also has difficulty standing due to pain (cooking, ADLs, housework).  Uses the cart at grocery stores.  She had sleeve surgery and lost 100 lbs. She has boils and skin infection as a result of the redundant tissue.  She also has ESI injection for her back and has had good relief.  She is trying to avoid surgery for her back but does want to have surgery to remove excess skin.     ? ? ?PERTINENT HISTORY:  ?Morbid obesity, back pain  ?Allergic rhinitis     ?Anemia  1980's   ?Anxiety     ?Carpal tunnel syndrome, left  pt denies   ?Depression     ?DUB (dysfunctional uterine bleeding)     ?Dysrhythmia  tachycardic   ?External hemorrhoid     ?GERD (gastroesophageal reflux disease)     ?Gout     ?History of hiatal hernia     ?History of kidney stones     ?Hypertension     ?Morbid obesity ([place])     ?Multiple thyroid nodules     ?Obesity     ?Osteoarthritis, knee     ?PE (pulmonary thromboembolism) ([place])     ?Pneumonia     ?Recurrent UTI     ?Rotator cuff syndrome of left shoulder     ?Type II  diabetes mellitus ([place])     ? ? ?PAIN:  ?Are you having pain? Yes: NPRS scale: 8/10 ?Pain location: bilateral low back to L and hip  ?Pain description: achy and numb ?Aggravating factors: activity, standing.  ?Relieving factors: sitting,  nothing but pain meds.  Has not tried heating pad .  ? ? ?PRECAUTIONS: None ? ?WEIGHT BEARING RESTRICTIONS No ? ?FALLS:  ?Has patient fallen in last 6 months? No, Number of falls: 0 ? ?LIVING ENVIRONMENT: ?Lives with: lives alone ?Lives in: House/apartment ?Stairs: No ?Has following equipment at home: Single point cane ? 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 ?PLOF: Independent, Vocation/Vocational requirements: retired, and Leisure: limited.  Would like to get well enough to drive a bus again.  ? ?PATIENT GOALS:   ? ? ?OBJECTIVE:  ? ?DIAGNOSTIC FINDINGS:  ?Her last ESI was on December 16, 2020.  Injection was performed on the left at L4-L5 and resulted in good symptom relief up until recently.  Her symptoms have returned including numbness and pain that radiates down the left leg into the left foot. MRI of the lumbar spine from April 2022 was  reviewed which demonstrates moderate left foraminal stenosis at L4-L5.  I also reviewed an MRI of her left hip done at that same time which showed moderate left hip osteoarthritis.  We will order a repeat lumbar ESI to be done at [facility].    ?   ? ?PATIENT SURVEYS:  ?none ? ?COGNITION: ? Overall cognitive status: Within functional limits for tasks assessed   ?  ?SENSATION: ?Tingles in toes  ? ? ?POSTURE:  ?Morbid obesity ? ?PALPATION: ?NT ? ?LUMBAR ROM:  ? ?Active  A/PROM  ?07/02/2021  ?Flexion   ?Extension   ?Right lateral flexion   ?Left lateral flexion   ?Right rotation   ?Left rotation   ? (Blank rows = not tested) ? ?LE MMT: ? ?MMT Right ?07/02/2021 Left ?07/02/2021  ?Hip flexion 3 3  ?Hip extension    ?Hip abduction    ?Hip adduction    ?Hip internal rotation    ?Hip external rotation    ?Knee flexion 5 5  ?Knee extension 5 5  ?Ankle dorsiflexion 5 5  ?Ankle plantarflexion    ?Ankle inversion    ?Ankle eversion    ? (Blank rows = not tested) ? ?LUMBAR SPECIAL TESTS:  ?NT ? ?FUNCTIONAL TESTS:  ?5 times sit to stand: 22.68 sec with  hands on chair  ?2 minute walk test: 222 feet, brief pause for shoe.  O2 sat 93% and HR up to 145 down to 113 after about 1 min  ? ?GAIT: ?Distance walked: 222 ?Assistive device utilized: None ?Level of assistance: Mo

## 2021-07-05 ENCOUNTER — Other Ambulatory Visit: Payer: Self-pay | Admitting: Sports Medicine

## 2021-07-06 ENCOUNTER — Telehealth: Payer: Self-pay | Admitting: Plastic Surgery

## 2021-07-06 NOTE — Telephone Encounter (Signed)
Pt is calling in stating that she was told by her PT that she does not need PT and would like to see what her next step would be.  Physical Therapist sent some kind of information to Dr. Marla Roe and they cancelled all of her PT.  Pt would like to have a call back. ?

## 2021-07-08 ENCOUNTER — Ambulatory Visit: Payer: PPO | Admitting: Physical Therapy

## 2021-07-12 ENCOUNTER — Other Ambulatory Visit: Payer: Self-pay

## 2021-07-12 MED ORDER — GABAPENTIN 300 MG PO CAPS: 300.0000 mg | ORAL_CAPSULE | Freq: Three times a day (TID) | ORAL | 1 refills | Status: DC

## 2021-07-12 NOTE — Progress Notes (Signed)
Pt called requesting a refill on Gabapentin 400 mg. Discussed with pt that we never prescribed that does, we have give her 300 mg previously. She would need to contact Dr. Ronnald Ramp who prescribed the other dose if she is needing a refill on that. Pt states she no longer sees that doctor and is leaving town today so she would like a refill. ? ?Gabapentin 300 mg refilled for pt today. If she would like to increase her dosage she needs to schedule an appt with [place] to discuss this. ? ?

## 2021-07-20 ENCOUNTER — Telehealth: Payer: Self-pay | Admitting: Plastic Surgery

## 2021-07-20 NOTE — Telephone Encounter (Signed)
Called patient to schedule virtual visit with Dr. Marla Roe. This appt is needed prior to [place] submitting to insurance. LVM for callback.  ?

## 2021-07-26 ENCOUNTER — Encounter: Payer: Self-pay | Admitting: Plastic Surgery

## 2021-07-26 ENCOUNTER — Ambulatory Visit (INDEPENDENT_AMBULATORY_CARE_PROVIDER_SITE_OTHER): Payer: PPO | Admitting: Plastic Surgery

## 2021-07-26 ENCOUNTER — Other Ambulatory Visit: Payer: Self-pay | Admitting: Internal Medicine

## 2021-07-26 DIAGNOSIS — G4733 Obstructive sleep apnea (adult) (pediatric): Secondary | ICD-10-CM

## 2021-07-26 DIAGNOSIS — M546 Pain in thoracic spine: Secondary | ICD-10-CM | POA: Diagnosis not present

## 2021-07-26 DIAGNOSIS — E119 Type 2 diabetes mellitus without complications: Secondary | ICD-10-CM

## 2021-07-26 DIAGNOSIS — Z1231 Encounter for screening mammogram for malignant neoplasm of breast: Secondary | ICD-10-CM

## 2021-07-26 DIAGNOSIS — G8929 Other chronic pain: Secondary | ICD-10-CM

## 2021-07-26 DIAGNOSIS — M793 Panniculitis, unspecified: Secondary | ICD-10-CM

## 2021-07-26 DIAGNOSIS — M549 Dorsalgia, unspecified: Secondary | ICD-10-CM | POA: Insufficient documentation

## 2021-07-26 NOTE — Progress Notes (Signed)
? ?  Subjective:  ? ? Patient ID: Maria [name], female    DOB: Jun 11, 1960, 61 y.o.   MRN: [id number] ? ?The patient is a 61 year old female joining me by phone for further discussion about her abdomen.  She is 5 feet 8 inches tall and now weighs around 300 pounds.  Her start weight was 428 pounds.  She had a gastric sleeve by Dr. Hassell Done and has been doing great with keeping her weight down.  She complains of neck and back pain due to the hanging pannus.  She did see Dr. Hassell Done and there is nothing further that can be done with the gastric sleeve.  The patient expresses understanding that the only part of the abdomen to be removed would be the part that is under the umbilicus.  The upper part would not be part of the surgery.  She says that she understands that and would like to move ahead.  She feels she is likely at the weight she is going to to be long-term.  She has diabetes and is otherwise in good health.  I have her pictures from her last visit in the chart. ?  ? ?Review of Systems  ?Constitutional:  Negative for activity change and appetite change.  ?Eyes: Negative.   ?Respiratory: Negative.  Negative for chest tightness.   ?Cardiovascular: Negative.  Negative for leg swelling.  ?Gastrointestinal: Negative.   ?Endocrine: Negative.   ?Genitourinary: Negative.   ?Musculoskeletal:  Positive for back pain and neck pain.  ?Skin:  Positive for rash.  ?Neurological: Negative.   ? ?   ?Objective:  ? Physical Exam ? ? ?   ?Assessment & Plan:  ? ?  ICD-10-CM   ?1. Type 2 diabetes mellitus without complication, without long-term current use of insulin (HCC)  E11.9   ?  ?2. Obstructive sleep apnea  G47.33   ?  ?3. Panniculitis  M79.3   ?  ?4. Chronic bilateral thoracic back pain  M54.6   ? G89.29   ?  ?  ? ?I connected with  Amelia Jo on 07/26/21 by by phone and verified that I am speaking with the correct person using two identifiers. I was at the office and the patient was at home.  I spent 20 min in the following  manner:  review of chart and pictures, discussion with the patient, documentation and request for pre-authorization.  I would like to check her protein level and will plan to keep her overnight due to her sleep apnea. ? ?Plan for panniculectomy with VAC placement ([facility]) with overnight stay. ?  ?I discussed the limitations of evaluation and management by telemedicine. The patient expressed understanding and agreed to proceed. ? ?

## 2021-07-27 ENCOUNTER — Ambulatory Visit: Payer: PPO

## 2021-07-28 ENCOUNTER — Telehealth: Payer: Self-pay

## 2021-07-28 NOTE — Telephone Encounter (Signed)
Asked pt if she could come by the Maria [name] office and pick up [facility] order requisition that Dr. Marla Roe placed during their televisit Monday. Adv that it would be at [facility] in an envelope. Pt confirmed she would come and pick it up tomorrow.  ?

## 2021-07-29 ENCOUNTER — Ambulatory Visit: Payer: PPO

## 2021-07-30 ENCOUNTER — Ambulatory Visit: Payer: PPO

## 2021-08-05 ENCOUNTER — Other Ambulatory Visit: Payer: Self-pay | Admitting: Sports Medicine

## 2021-08-06 ENCOUNTER — Ambulatory Visit: Payer: PPO

## 2021-08-10 ENCOUNTER — Other Ambulatory Visit: Payer: Self-pay | Admitting: *Deleted

## 2021-08-10 ENCOUNTER — Other Ambulatory Visit: Payer: Self-pay | Admitting: Sports Medicine

## 2021-08-10 ENCOUNTER — Telehealth: Payer: Self-pay | Admitting: Sports Medicine

## 2021-08-10 ENCOUNTER — Telehealth: Payer: PPO | Admitting: Plastic Surgery

## 2021-08-10 MED ORDER — METHOCARBAMOL 500 MG PO TABS: 500.0000 mg | ORAL_TABLET | Freq: Three times a day (TID) | ORAL | 0 refills | Status: DC | PRN

## 2021-08-10 NOTE — Telephone Encounter (Signed)
Pt request Refill of:   ? ?methocarbamol (ROBAXIN) 500 MG tablet [[id number]]  ?  Order Details ?Dose, Route, Frequency: As Directed  ?Dispense Quantity: 90 tablet Refills: 0   ?Note to Pharmacy: OUT OF REFILLS  ?     ?Sig: TAKE 1 TABLET BY MOUTH 3 TIMES DAILY AS NEEDED FOR MUSCLE SPASMS.  ?     ? ?--Send refill order to :  ? ?[id number] [place] [place]:  [phone] ?Fax:  [phone] ?  ?glh ?

## 2021-08-11 ENCOUNTER — Ambulatory Visit
Admission: RE | Admit: 2021-08-11 | Discharge: 2021-08-11 | Disposition: A | Discharge status: Home / Self Care | Payer: PPO | Source: Ambulatory Visit | Attending: Internal Medicine | Admitting: Internal Medicine

## 2021-08-11 DIAGNOSIS — Z1231 Encounter for screening mammogram for malignant neoplasm of breast: Secondary | ICD-10-CM

## 2021-08-12 LAB — COMPREHENSIVE METABOLIC PANEL
ALT: 13 IU/L (ref 0–32)
AST: 13 IU/L (ref 0–40)
Albumin/Globulin Ratio: 1.3 (ref 1.2–2.2)
Albumin: 3.9 g/dL (ref 3.8–4.8)
Alkaline Phosphatase: 104 IU/L (ref 44–121)
BUN/Creatinine Ratio: 15 (ref 12–28)
BUN: 16 mg/dL (ref 8–27)
Bilirubin Total: 0.3 mg/dL (ref 0.0–1.2)
CO2: 24 mmol/L (ref 20–29)
Calcium: 9.2 mg/dL (ref 8.7–10.3)
Chloride: 102 mmol/L (ref 96–106)
Creatinine, Ser: 1.1 mg/dL — ABNORMAL HIGH (ref 0.57–1.00)
Globulin, Total: 2.9 g/dL (ref 1.5–4.5)
Glucose: 201 mg/dL — ABNORMAL HIGH (ref 70–99)
Potassium: 3.7 mmol/L (ref 3.5–5.2)
Sodium: 141 mmol/L (ref 134–144)
Total Protein: 6.8 g/dL (ref 6.0–8.5)
eGFR: 57 mL/min/{1.73_m2} — ABNORMAL LOW (ref >59)

## 2021-08-25 ENCOUNTER — Other Ambulatory Visit: Payer: Self-pay

## 2021-08-25 ENCOUNTER — Emergency Department (HOSPITAL_BASED_OUTPATIENT_CLINIC_OR_DEPARTMENT_OTHER): Payer: PPO

## 2021-08-25 ENCOUNTER — Emergency Department (HOSPITAL_BASED_OUTPATIENT_CLINIC_OR_DEPARTMENT_OTHER)
Admission: EM | Admit: 2021-08-25 | Discharge: 2021-08-25 | Disposition: A | Discharge status: Home / Self Care | Payer: PPO | Attending: Emergency Medicine | Admitting: Emergency Medicine

## 2021-08-25 ENCOUNTER — Encounter (HOSPITAL_BASED_OUTPATIENT_CLINIC_OR_DEPARTMENT_OTHER): Payer: Self-pay | Admitting: Emergency Medicine

## 2021-08-25 ENCOUNTER — Ambulatory Visit (HOSPITAL_BASED_OUTPATIENT_CLINIC_OR_DEPARTMENT_OTHER): Admission: RE | Admit: 2021-08-25 | Payer: PPO | Source: Ambulatory Visit

## 2021-08-25 DIAGNOSIS — E119 Type 2 diabetes mellitus without complications: Secondary | ICD-10-CM | POA: Diagnosis not present

## 2021-08-25 DIAGNOSIS — I10 Essential (primary) hypertension: Secondary | ICD-10-CM | POA: Diagnosis not present

## 2021-08-25 DIAGNOSIS — Z7984 Long term (current) use of oral hypoglycemic drugs: Secondary | ICD-10-CM | POA: Diagnosis not present

## 2021-08-25 DIAGNOSIS — R2241 Localized swelling, mass and lump, right lower limb: Secondary | ICD-10-CM | POA: Insufficient documentation

## 2021-08-25 DIAGNOSIS — Z79899 Other long term (current) drug therapy: Secondary | ICD-10-CM | POA: Insufficient documentation

## 2021-08-25 NOTE — ED Provider Notes (Signed)
[facility] EMERGENCY DEPARTMENT Provider Note   CSN: [id number] Arrival date & time: 08/25/21  0945     History  Chief Complaint  Patient presents with   Mass    Maria [name] is a 61 y.o. female who presents today for evaluation of a mass on her right thigh.  She states that she noticed this about a month ago.  She went to urgent care today as it is tender to touch, she reports that they told her she needed to be seen in the emergency room for imaging and further evaluation.  She denies any fevers.  She otherwise feels well.  She thinks it may be getting slightly bigger over the past month but is unsure.  Patient states that she is very nervous about this area.  She denies any injections in this area.  HPI     Home Medications Prior to Admission medications   Medication Sig Start Date End Date Taking? Authorizing Provider  benazepril (LOTENSIN) 40 MG tablet Take 40 mg by mouth every evening.  07/06/16  Yes [provider]  Colchicine 0.6 MG CAPS TAKE 1 CAPSULE BY MOUTH 1-2 TIMES A DAY FOR 3 DAYS FOR GOUT FLARE AND 1 CAP DAILY FOR PROPHYLAXIS 04/06/21  Yes Dickie La, MD  furosemide (LASIX) 20 MG tablet Take 20 mg by mouth every evening.  12/28/15  Yes [provider]  gabapentin (NEURONTIN) 300 MG capsule Take 1 capsule (300 mg total) by mouth 3 (three) times daily. 07/12/21  Yes Draper, Christia Reading R, DO  glipiZIDE (GLUCOTROL) 5 MG tablet Take 5 mg by mouth every evening.  07/13/17  Yes [provider]  metFORMIN (GLUCOPHAGE) 850 MG tablet Take 850 mg by mouth 2 (two) times daily.  07/13/17  Yes [provider]  methocarbamol (ROBAXIN) 500 MG tablet Take 1 tablet (500 mg total) by mouth 3 (three) times daily as needed for muscle spasms. 08/10/21  Yes Lilia Argue R, DO  metoprolol tartrate (LOPRESSOR) 100 MG tablet Take 200 mg by mouth every evening.   Yes [provider]  Oxycodone HCl 10 MG TABS Take 10 mg by mouth every 4 (four)  hours as needed. 05/09/21  Yes [provider]  pantoprazole (PROTONIX) 40 MG tablet Take 40 mg by mouth every evening.    Yes [provider]  Vitamin D, Ergocalciferol, (DRISDOL) 1.25 MG (50000 UT) CAPS capsule Take 50,000 Units by mouth every Monday.   Yes [provider]  loperamide (IMODIUM A-D) 2 MG tablet Take 4 mg by mouth 4 (four) times daily as needed for diarrhea or loose stools.    [provider]  naloxone [facility]) 4 MG/0.1ML LIQD nasal spray kit Place 1 spray into the nose as needed (opioid overdose).    [provider]  Semaglutide, 1 MG/DOSE, (OZEMPIC, 1 MG/DOSE,) 2 MG/1.5ML SOPN Inject 1 mg into the skin every Monday.    [provider]      Allergies    Propoxyphene n-acetaminophen    Review of Systems   Review of Systems See above Physical Exam Updated Vital Signs BP (!) 158/84 (BP Location: Right Arm)   Pulse 74   Temp 97.8 F (36.6 C) (Oral)   Resp 18   Ht _0  (1.727 m)   Wt (!) 146.5 kg   LMP 04/11/2009   SpO2 100%   BMI 49.11 kg/m  Physical Exam Vitals and nursing note reviewed.  Constitutional:      General: She is not  in acute distress. HENT:     Head: Normocephalic and atraumatic.  Cardiovascular:     Rate and Rhythm: Normal rate.  Pulmonary:     Effort: Pulmonary effort is normal. No respiratory distress.  Musculoskeletal:     Cervical back: No rigidity.  Skin:    Comments: Right thigh with out abnormal erythema or discoloration On the posterior lateral mid right thigh there is a approximately 1 to 2 cm area of palpable subcutaneous induration which is tender.  No abnormal fluctuance palpated.  Neurological:     Mental Status: She is alert. Mental status is at baseline.     Comments: Awake and alert, answers all questions appropriately.  Speech is not slurred.    Psychiatric:        Mood and Affect: Mood normal.    ED Results / Procedures / Treatments   Labs (all labs ordered are  listed, but only abnormal results are displayed) Labs Reviewed - No data to display  EKG None  Radiology No results found.  Procedures Procedures    Medications Ordered in ED Medications - No data to display  ED Course/ Medical Decision Making/ A&P                           Medical Decision Making Patient is a 61 year old woman who presents today for evaluation of a painful lump on the right lateral thigh.  This has been there for months however she went to urgent care where she states she was told she needs imaging causing her to present here. On my exam she does not have any visible skin changes in this area.  She does have a palpable subcutaneous nodule that is tender. There is no specific evidence of infection.   Patient and I discussed options for evaluation.  She elected to follow-up with her outpatient care team including surgeon who has previously remove lipomas.  Return precautions were discussed with patient who states their understanding.  At the time of discharge patient denied any unaddressed complaints or concerns.  Patient is agreeable for discharge home.  Note: Portions of this report may have been transcribed using voice recognition software. Every effort was made to ensure accuracy; however, inadvertent computerized transcription errors may be present    Problems Addressed: Mass of right thigh: acute illness or injury  Amount and/or Complexity of Data Reviewed Radiology: ordered.  Risk Decision regarding hospitalization.           Final Clinical Impression(s) / ED Diagnoses Final diagnoses:  Mass of right thigh    Rx / DC Orders ED Discharge Orders     None         Ollen Gross 08/25/21 1746    Lennice Sites, DO 08/28/21 [phone]

## 2021-08-25 NOTE — ED Triage Notes (Signed)
Pt via pov from UC with a "bump" on her right thigh. She was at [facility] for unrelated issue and was told the bump needed imaging. Pt states it has been on her leg for approximately 1 month, and is growing in size. Pt denies any weeping, states it is painful. Pt alert 7 oriented, nad noted.  ?

## 2021-08-25 NOTE — ED Notes (Signed)
Came to d/c patient and she had already left.  She was requesting outpatient follow up instead of [place] here.   ?

## 2021-08-25 NOTE — Discharge Instructions (Signed)
Please follow-up with your outpatient care team for further evaluation. ?

## 2021-08-27 ENCOUNTER — Other Ambulatory Visit: Payer: Self-pay

## 2021-08-27 MED ORDER — GABAPENTIN 300 MG PO CAPS: ORAL_CAPSULE | ORAL | 1 refills | Status: DC

## 2021-08-27 NOTE — Progress Notes (Signed)
Pt called requesting refill on Gabapentin. She would like to increase dose to 4 times daily.  We will refill 300 mg gabapentin to take 2 capsules BID. Pt is aware.

## 2021-09-03 ENCOUNTER — Other Ambulatory Visit: Payer: Self-pay | Admitting: Sports Medicine

## 2021-09-07 ENCOUNTER — Other Ambulatory Visit: Payer: Self-pay | Admitting: Sports Medicine

## 2021-09-08 ENCOUNTER — Telehealth: Payer: Self-pay | Admitting: Surgical

## 2021-09-08 ENCOUNTER — Ambulatory Visit (INDEPENDENT_AMBULATORY_CARE_PROVIDER_SITE_OTHER): Payer: PPO | Admitting: Surgical

## 2021-09-08 ENCOUNTER — Other Ambulatory Visit: Payer: Self-pay | Admitting: Surgery

## 2021-09-08 ENCOUNTER — Encounter: Payer: Self-pay | Admitting: Surgical

## 2021-09-08 VITALS — BP 125/83 | HR 80 | Ht 68.0 in | Wt 345.0 lb

## 2021-09-08 DIAGNOSIS — G8929 Other chronic pain: Secondary | ICD-10-CM

## 2021-09-08 DIAGNOSIS — M546 Pain in thoracic spine: Secondary | ICD-10-CM | POA: Diagnosis not present

## 2021-09-08 DIAGNOSIS — M793 Panniculitis, unspecified: Secondary | ICD-10-CM

## 2021-09-08 DIAGNOSIS — M629 Disorder of muscle, unspecified: Secondary | ICD-10-CM

## 2021-09-08 NOTE — Progress Notes (Signed)
Patient ID: Maria [name], female    DOB: [birth year], 61 y.o.   MRN: [id number]  Chief Complaint  Patient presents with   Pre-op Exam     ICD-10-CM   1. Chronic bilateral thoracic back pain  M54.6    G89.29     2. Panniculitis  M79.3      History of Present Illness: Maria [name] is a 61 y.o.  female  with a history of panniculitis.  She presented for upcoming procedure, panniculectomy with Dr. Marla Roe.  Unfortunately, she reports her A1c has been elevated, most prior A1c was approximately 11% and she reports more recently about 8%.  She seems sure that it is over 8%.  I am unable to review EMR records.  PMH Significant for: Diabetes, hypertension, OSA, GERD, gout, pulmonary embolism, chronic oxycodone 20 mg 3 times daily for joint pain, reports she is on a pain contract and she has discussed this with pain management provider who states that she can receive normal narcotics that we would prescribe for any other patient postoperatively.   Past Medical History: Allergies: Allergies  Allergen Reactions   Propoxyphene N-Acetaminophen Palpitations    Current Medications:  Current Outpatient Medications:    benazepril (LOTENSIN) 40 MG tablet, Take 40 mg by mouth every evening. , Disp: , Rfl:    Colchicine 0.6 MG CAPS, TAKE 1 CAPSULE BY MOUTH 1-2 TIMES A DAY FOR 3 DAYS FOR GOUT FLARE AND 1 CAP DAILY FOR PROPHYLAXIS, Disp: 135 capsule, Rfl: 1   furosemide (LASIX) 20 MG tablet, Take 20 mg by mouth every evening. , Disp: , Rfl:    gabapentin (NEURONTIN) 300 MG capsule, Take 2 capsules (600 mg) twice daily., Disp: 120 capsule, Rfl: 1   glipiZIDE (GLUCOTROL) 5 MG tablet, Take 5 mg by mouth every evening. , Disp: , Rfl: 5   metFORMIN (GLUCOPHAGE) 850 MG tablet, Take 850 mg by mouth 2 (two) times daily. , Disp: , Rfl: 5   methocarbamol (ROBAXIN) 500 MG tablet, Take 1 tablet (500 mg total) by mouth 3 (three) times daily as needed for muscle spasms., Disp: 90 tablet, Rfl: 0   metoprolol  tartrate (LOPRESSOR) 100 MG tablet, Take 200 mg by mouth every evening., Disp: , Rfl:    naloxone (NARCAN) 4 MG/0.1ML LIQD nasal spray kit, Place 1 spray into the nose as needed (opioid overdose)., Disp: , Rfl:    Oxycodone HCl 10 MG TABS, Take 10 mg by mouth every 4 (four) hours as needed., Disp: , Rfl:    pantoprazole (PROTONIX) 40 MG tablet, Take 40 mg by mouth every evening. , Disp: , Rfl:    Vitamin D, Ergocalciferol, (DRISDOL) 1.25 MG (50000 UT) CAPS capsule, Take 50,000 Units by mouth every Monday., Disp: , Rfl:    LANTUS SOLOSTAR 100 UNIT/ML Solostar Pen, Inject into the skin., Disp: , Rfl:    loperamide (IMODIUM A-D) 2 MG tablet, Take 4 mg by mouth 4 (four) times daily as needed for diarrhea or loose stools. (Patient not taking: Reported on 09/08/2021), Disp: , Rfl:    Semaglutide, 1 MG/DOSE, (OZEMPIC, 1 MG/DOSE,) 2 MG/1.5ML SOPN, Inject 1 mg into the skin every Monday., Disp: , Rfl:   Past Medical Problems: Past Medical History:  Diagnosis Date   Allergic rhinitis    Anemia    1980's   Anxiety    Carpal tunnel syndrome, left    pt denies   Depression    DUB (dysfunctional uterine bleeding)    Dysrhythmia  tachycardic   External hemorrhoid    GERD (gastroesophageal reflux disease)    Gout    History of hiatal hernia    History of kidney stones    Hypertension    Morbid obesity ([place])    Multiple thyroid nodules    Obesity    Osteoarthritis, knee    PE (pulmonary thromboembolism) ([place])    Pneumonia    Recurrent UTI    Rotator cuff syndrome of left shoulder    Type II diabetes mellitus ([place])     Past Surgical History: Past Surgical History:  Procedure Laterality Date   CHOLECYSTECTOMY     HERNIA REPAIR     INCISION AND DRAINAGE ABSCESS N/A 06/01/2014   Procedure: INCISION AND DRAINAGE ABSCESS;  Surgeon: Rolm Bookbinder, MD;  Location: [place];  Service: General;  Laterality: N/A;   IR GENERIC HISTORICAL  05/18/2016   IR EPIDUROGRAPHY 05/18/2016 San Morelle, MD  [facility]   JOINT REPLACEMENT     LAPAROSCOPIC GASTRIC SLEEVE RESECTION WITH HIATAL HERNIA REPAIR N/A 07/26/2016   Procedure: LAPAROSCOPIC GASTRIC SLEEVE RESECTION WITH HIATAL HERNIA REPAIR, UPPER ENDOSCOPY;  Surgeon: Johnathan Hausen, MD;  Location: [facility];  Service: General;  Laterality: N/A;   LITHOTRIPSY     MASS EXCISION Left 11/27/2020   Procedure: EXCISION OF MASS IN LEFT BUTTOCK;  Surgeon: Johnathan Hausen, MD;  Location: [place];  Service: General;  Laterality: Left;   PULMONARY THROMBECTOMY N/A 03/19/2018   Procedure: PULMONARY THROMBECTOMY;  Surgeon: Algernon Huxley, MD;  Location: [place] [facility];  Service: Cardiovascular;  Laterality: N/A;   RADIOACTIVE SEED GUIDED EXCISIONAL BREAST BIOPSY Left 01/11/2019   Procedure: RADIOACTIVE SEED GUIDED OF 2 SITES EXCISIONAL LEFT BREAST BIOPSY;  Surgeon: Johnathan Hausen, MD;  Location: [place];  Service: General;  Laterality: Left;   REPLACEMENT TOTAL KNEE BILATERAL     TUBAL LIGATION      Social History: Social History   Socioeconomic History   Marital status: Divorced    Spouse name: Not on file   Number of children: Not on file   Years of education: Not on file   Highest education level: Not on file  Occupational History   Not on file  Tobacco Use   Smoking status: Never   Smokeless tobacco: Never  Vaping Use   Vaping Use: Never used  Substance and Sexual Activity   Alcohol use: Yes    Comment: occasional   Drug use: No   Sexual activity: Yes    Partners: Male    Birth control/protection: Surgical    Comment: tubal ligation age 65  Other Topics Concern   Not on file  Social History Narrative   She has a son who was killed in the [employer].  Her adopted mother recently died of pancreatic cancer.    Social Determinants of Health   Financial Resource Strain: Not on file  Food Insecurity: Not on file  Transportation Needs: Not on file  Physical Activity: Not on file  Stress: Not on file  Social  Connections: Not on file  Intimate Partner Violence: Not on file    Family History: Family History  Problem Relation Age of Onset   Coronary artery disease Other    Hypertension Other    Diabetes Other    Obesity Other    Diabetes Mother    Hypertension Mother    Congestive Heart Failure Mother     Physical Exam: Vital Signs BP 125/83 (BP Location: Left Wrist, Patient  Position: Sitting)   Pulse 80   Ht '5\' 8"'  (1.727 m)   Wt (!) 345 lb (156.5 kg)   LMP 04/11/2009   SpO2 98%   BMI 52.46 kg/m   Physical Exam Constitutional:      General: Not in acute distress.    Appearance: Normal appearance. Not ill-appearing.  HENT:     Head: Normocephalic and atraumatic.  Psychiatric:        Mood and Affect: Mood normal.  Tearful       Behavior: Behavior normal.    Assessment/Plan: The patient was scheduled for panniculectomy and liposuction with Dr. Marla Roe, however surgery was canceled due to patient's A1c being elevated above 7.5%.  I discussed with the patient that she is at an increased risk of postoperative complications with an elevated A1c.  Patient was understanding of this, we did discuss scheduling a follow-up for telemetry visit in 3 months to discuss recheck of A1c.  Recommend calling with questions or concerns  Electronically signed by: Carola Rhine Serria Sloma, PA-C 09/08/2021 2:16 PM

## 2021-09-08 NOTE — Telephone Encounter (Signed)
PA advised that patient's A1C is elevated; Surgery has to be postponed. Patient to follow up with [facility] in August to discuss update A1C results.

## 2021-09-10 ENCOUNTER — Inpatient Hospital Stay: Admission: RE | Admit: 2021-09-10 | Payer: PPO | Source: Ambulatory Visit

## 2021-09-13 ENCOUNTER — Other Ambulatory Visit (HOSPITAL_COMMUNITY): Payer: Self-pay | Admitting: Gastroenterology

## 2021-09-13 ENCOUNTER — Other Ambulatory Visit: Payer: Self-pay | Admitting: Gastroenterology

## 2021-09-13 DIAGNOSIS — R112 Nausea with vomiting, unspecified: Secondary | ICD-10-CM

## 2021-09-16 ENCOUNTER — Inpatient Hospital Stay
Admission: RE | Admit: 2021-09-16 | Discharge: 2021-09-16 | Disposition: A | Discharge status: Home / Self Care | Payer: PPO | Source: Ambulatory Visit | Attending: Surgery | Admitting: Surgery

## 2021-09-22 ENCOUNTER — Other Ambulatory Visit: Payer: PPO

## 2021-09-22 ENCOUNTER — Ambulatory Visit (HOSPITAL_BASED_OUTPATIENT_CLINIC_OR_DEPARTMENT_OTHER): Admit: 2021-09-22 | Payer: PPO | Admitting: Plastic Surgery

## 2021-09-22 ENCOUNTER — Encounter (HOSPITAL_BASED_OUTPATIENT_CLINIC_OR_DEPARTMENT_OTHER): Payer: Self-pay

## 2021-09-22 SURGERY — PANNICULECTOMY, ABDOMINAL, WITH LIPOSUCTION
Anesthesia: General | Site: Abdomen

## 2021-09-23 ENCOUNTER — Ambulatory Visit (HOSPITAL_COMMUNITY): Payer: PPO

## 2021-09-23 ENCOUNTER — Ambulatory Visit (INDEPENDENT_AMBULATORY_CARE_PROVIDER_SITE_OTHER): Payer: PPO | Admitting: Sports Medicine

## 2021-09-23 VITALS — BP 139/71 | Ht 68.0 in | Wt 345.0 lb

## 2021-09-23 DIAGNOSIS — M25512 Pain in left shoulder: Secondary | ICD-10-CM | POA: Diagnosis not present

## 2021-09-23 NOTE — Assessment & Plan Note (Signed)
Chronic, without preceding injury. Likely 2/2 overuse. Differential include impingement vs arthritis vs rotator cuff tendinitis.  Opted to not repeat subacromial injection today given no relief with the last.  Will start with x-ray of the left shoulder to better evaluate. If negative, will proceed with MRI.  -Can continue current pain regimen  -Recommended to keep current Robaxin dose and not increase at this time -F/u after imaging

## 2021-09-23 NOTE — Progress Notes (Signed)
   Maria [name] is a 61 y.o. female who presents to [facility] today for the following:  Chronic left shoulder pain: States that she received an injection in February but had no relief.  Her pain has been ongoing for the last 5 years.  Localizes her pain over the glenohumeral joint.  Describes as achy pain that hurts "all the time".  However, it is worse with flexion and abduction.  She has been taking Robaxin 3 times a day, gabapentin 300 twice daily.  She also has Oxy 10 mg to take every 4 hours as needed for pain, as prescribed by her primary care doctor (also has chronic back pain).  She is inquiring about whether her Robaxin can be increased.  Notes pain with sleeping.  Occasionally some numbness and tingling in her left fingers. She denies any injuries to her shoulder. Has not had any imaging of her shoulder.   PMH reviewed.  ROS as above. Medications reviewed.  Exam:  BP 139/71   Ht '5\' 8"'$  (1.727 m)   Wt (!) 345 lb (156.5 kg)   LMP 04/11/2009   BMI 52.46 kg/m  Gen: Well NAD MSK: Left Shoulder: Inspection reveals no obvious deformity, atrophy, or asymmetry b/l. No bruising. No swelling Palpation notable for TTP over [facility] joint  All AROM is limited 2/2 discomfort. Passive ROM in internal/external rotation normal and symmetrical b/l NV intact distally b/l Special Tests:  - Impingement: Pos Hawkins, neers, empty can sign. - Supraspinatous: Pos empty can - Infraspinatous/Teres Minor: 4/5 strength with ER - Subscapularis: 4/5 strength with IR - Biceps tendon: Negative Speeds, Yerrgason's  - Labrum: Pos Obriens, negative clunk, good stability - No drop arm sign  Assessment and Plan: 1) Chronic pain in left shoulder Chronic, without preceding injury. Likely 2/2 overuse. Differential include impingement vs arthritis vs rotator cuff tendinitis.  Opted to not repeat subacromial injection today given no relief with the last.  Will start with x-ray of the left shoulder to better evaluate. If  negative, will proceed with MRI.  -Can continue current pain regimen  -Recommended to keep current Robaxin dose and not increase at this time -F/u after imaging    Sharion Settler PGY-2 Family Medicine   Patient seen and evaluated with the resident.  I agree with the above plan of care.  She got no benefit from a recent subacromial cortisone injection.  I would like to start with getting x-rays of her left shoulder and I will call her with those results when available.  She may need further diagnostic imaging thereafter.  This note was dictated using Dragon naturally speaking software and may contain errors in syntax, spelling, or content which have not been identified prior to signing this note.

## 2021-09-24 ENCOUNTER — Other Ambulatory Visit: Payer: PPO

## 2021-09-28 ENCOUNTER — Other Ambulatory Visit: Payer: PPO

## 2021-09-28 ENCOUNTER — Encounter: Payer: PPO | Admitting: Student

## 2021-10-01 ENCOUNTER — Encounter: Payer: PPO | Admitting: Student

## 2021-10-04 ENCOUNTER — Encounter (HOSPITAL_COMMUNITY): Admission: RE | Admit: 2021-10-04 | Payer: PPO | Source: Ambulatory Visit

## 2021-10-05 ENCOUNTER — Ambulatory Visit: Payer: PPO

## 2021-10-06 ENCOUNTER — Encounter (HOSPITAL_COMMUNITY)
Admission: RE | Admit: 2021-10-06 | Discharge: 2021-10-06 | Disposition: A | Discharge status: Home / Self Care | Payer: PPO | Source: Ambulatory Visit | Attending: Gastroenterology | Admitting: Gastroenterology

## 2021-10-06 DIAGNOSIS — R112 Nausea with vomiting, unspecified: Secondary | ICD-10-CM | POA: Diagnosis present

## 2021-10-06 MED ORDER — TECHNETIUM TC 99M SULFUR COLLOID
2.0000 | Freq: Once | INTRAVENOUS | Status: AC | PRN
  Administered 2021-10-06: 2 via INTRAVENOUS

## 2021-10-14 ENCOUNTER — Ambulatory Visit
Admission: RE | Admit: 2021-10-14 | Discharge: 2021-10-14 | Disposition: A | Discharge status: Home / Self Care | Payer: PPO | Source: Ambulatory Visit | Attending: Surgery | Admitting: Surgery

## 2021-10-14 DIAGNOSIS — M629 Disorder of muscle, unspecified: Secondary | ICD-10-CM

## 2021-10-14 MED ORDER — IOPAMIDOL (ISOVUE-300) INJECTION 61%
100.0000 mL | Freq: Once | INTRAVENOUS | Status: AC | PRN
  Administered 2021-10-14: 100 mL via INTRAVENOUS

## 2021-10-15 ENCOUNTER — Encounter: Payer: PPO | Admitting: Student

## 2021-10-19 ENCOUNTER — Ambulatory Visit: Payer: Self-pay | Admitting: General Surgery

## 2021-10-19 NOTE — H&P (Signed)
REFERRING PHYSICIAN:  Koleen Distance, MD   PROVIDER:  Monico Blitz, MD   MRN: [id number] DOB: May 12, 1960 DATE OF ENCOUNTER: 10/19/2021   Subjective    Chief Complaint: new problem (Anal papilla )       History of Present Illness: Maria [name] is a 61 y.o. female who is seen today as an office consultation at the request of Dr. Alan Ripper for evaluation of new problem (Anal papilla ) .  61 year old female who presents to the office with complaints of an anal mass.  She has been having frequent loose stools and has been evaluated by Dr. Alan Ripper.  An anal papilla was noted at that time.  She is here today for evaluation for excision.  She describes the papilla as painful and difficult to clean.     Review of Systems: A complete review of systems was obtained from the patient.  I have reviewed this information and discussed as appropriate with the patient.  See HPI as well for other ROS.       Medical History: Past Medical History      Past Medical History:  Diagnosis Date   Arthritis     Diabetes mellitus without complication (CMS-HCC)     GERD (gastroesophageal reflux disease)     Hypertension             Patient Active Problem List  Diagnosis   Morbid obesity (CMS-HCC)   Type 2 or unspecified type diabetes mellitus (CMS-HCC)   S/P TKR (total knee replacement)   S/P laparoscopic sleeve gastrectomy   Other disorder of menstruation and other abnormal bleeding from female genital tract   Obstructive sleep apnea   Osteoarthritis of left hip   Hyperlipidemia   Achilles tendinitis of right lower extremity   Allergic rhinitis   Anxiety state, unspecified   Back pain   Carpal tunnel syndrome on left   Cervical disc disorder with radiculopathy of cervical region   Closed fracture of fourth toe of left foot   Costochondritis   Depression   Diabetes type 2, uncontrolled   Rotator cuff syndrome   Right upper quadrant pain   Recurrent nephrolithiasis   Radicular  pain in left arm   Panniculitis   Hemorrhoids, external   Gout, unspecified   Essential hypertension   Esophageal reflux      Past Surgical History       Past Surgical History:  Procedure Laterality Date   CHOLECYSTECTOMY       gastric sleeve       REMOVAL BLOOD CLOT FROM ANTERIOR SEGMENT            Allergies  No Known Allergies           Current Outpatient Medications on File Prior to Visit  Medication Sig Dispense Refill   aspirin 81 MG EC tablet Take 81 mg by mouth once daily       benazepriL (LOTENSIN) 40 MG tablet Take 40 mg by mouth once daily       colchicine (COLCRYS) 0.6 mg tablet TAKE 2 TABLETS BY MOUTH AT ONSET OF GOUT ATTACK, THEN 1 TABLET 1 HOURS LATER.       FUROsemide (LASIX) 20 MG tablet Take by mouth       gabapentin (NEURONTIN) 400 MG capsule Take 400 mg by mouth 4 (four) times daily       glipiZIDE (GLUCOTROL) 5 MG tablet Take 5 mg by mouth every morning       metFORMIN (GLUCOPHAGE) 850  MG tablet Take 850 mg by mouth 2 (two) times daily       metoprolol succinate (TOPROL-XL) 200 MG XL tablet Take 200 mg by mouth once daily       phentermine (ADIPEX-P) 15 MG capsule Take 15 mg by mouth once daily        No current facility-administered medications on file prior to visit.      Family History       Family History  Problem Relation Age of Onset   Obesity Mother     High blood pressure (Hypertension) Mother     Diabetes Mother          Social History       Tobacco Use  Smoking Status Never  Smokeless Tobacco Never      Social History  Social History        Socioeconomic History   Marital status: Divorced  Tobacco Use   Smoking status: Never   Smokeless tobacco: Never  Substance and Sexual Activity   Alcohol use: Never   Drug use: Never        Objective:      There were no vitals filed for this visit.    Exam Gen: NAD CV: RRR Lungs: CTA Abd: soft Rectal: Anal papilla arising from what appears to be the anterior midline anal  canal, tender to palpation     Labs, Imaging and Diagnostic Testing:     Assessment and Plan:  Diagnoses and all orders for this visit:   Hypertrophied anal papilla   61 year old female who presents to the office for evaluation of an anal papilla seen on recent flexible sigmoidoscopy.  On exam today this appears to be arising from the anterior midline anal canal on a small stalk.  I have recommended surgical excision.  Patient is scheduled to see Dr. Hassell Done for evaluation of a right thigh lipoma.  We will try to work on getting this scheduled as a joint procedure between the 2 of [place].   No follow-ups on file.     Rosario Adie, MD Colon and Rectal Surgery [facility]

## 2021-10-25 ENCOUNTER — Telehealth: Payer: Self-pay

## 2021-10-25 ENCOUNTER — Other Ambulatory Visit: Payer: Self-pay | Admitting: Sports Medicine

## 2021-10-25 NOTE — Telephone Encounter (Signed)
I spoke with the pharmacy regarding the pt's Gabapentin refills. Dr. Micheline Chapman has only prescribed 300 mg and her PCP Dr. Sheryle Hail has prescribed 400 mg. The pt last filled the Rx on 10/18/21 from Dr. Sheryle Hail.  Pt will need to get her refills from her PCP, Dr. Sheryle Hail, from now on. Since she's been taking the 400 mg and needing a dosage change she can contact her PCPs office for that change as we will no longer handle the gabapentin refills.

## 2021-10-26 ENCOUNTER — Other Ambulatory Visit: Payer: Self-pay | Admitting: Sports Medicine

## 2021-10-29 ENCOUNTER — Encounter: Payer: Self-pay | Admitting: Student

## 2021-10-29 NOTE — Progress Notes (Signed)
Surgical Clearance has been received from Dr. Sheryle Hail for patient's upcoming surgery with Dr. Marla Roe.  Medications to hold prior to surgery Glucophage.  Per Dr. Sheryle Hail, patient may hold Glucophage for surgery and may restart the medication the next day.  Per Dr. Sheryle Hail, he will provide extra medications for pain control for the patient.

## 2021-11-10 ENCOUNTER — Other Ambulatory Visit: Payer: Self-pay | Admitting: *Deleted

## 2021-11-10 MED ORDER — METHOCARBAMOL 500 MG PO TABS: 500.0000 mg | ORAL_TABLET | Freq: Three times a day (TID) | ORAL | 1 refills | Status: DC | PRN

## 2021-11-18 ENCOUNTER — Encounter (HOSPITAL_BASED_OUTPATIENT_CLINIC_OR_DEPARTMENT_OTHER): Payer: Self-pay | Admitting: General Surgery

## 2021-11-18 NOTE — Progress Notes (Signed)
Spoke w/ via phone for pre-op interview--- pt Lab needs dos----  [employer] results------ current ekg in epic/ chart COVID test -----patient states asymptomatic no test needed Arrive at ------- 0700 on 11-26-2021 NPO after MN NO Solid Food.  Clear liquids from MN until--- 0600 Med rec completed Medications to take morning of surgery ----- gabapentin Diabetic medication ----- do not take metformin morning of surgery and do half dose lantus night before surgery Patient instructed no nail polish to be worn day of surgery Patient instructed to bring photo id and insurance card day of surgery Patient aware to have Driver (ride ) / caregiver  for 24 hours after surgery -- daughter, shenetta Patient Special Instructions ----- n/a Pre-Op special Istructions ----- n/a Patient verbalized understanding of instructions that were given at this phone interview. Patient denies shortness of breath, chest pain, fever, cough at this phone interview.

## 2021-11-26 ENCOUNTER — Ambulatory Visit (HOSPITAL_BASED_OUTPATIENT_CLINIC_OR_DEPARTMENT_OTHER): Payer: PPO | Admitting: Anesthesiology

## 2021-11-26 ENCOUNTER — Ambulatory Visit (HOSPITAL_BASED_OUTPATIENT_CLINIC_OR_DEPARTMENT_OTHER)
Admission: RE | Admit: 2021-11-26 | Discharge: 2021-11-26 | Disposition: A | Discharge status: Home / Self Care | Payer: PPO | Attending: General Surgery | Admitting: General Surgery

## 2021-11-26 ENCOUNTER — Encounter (HOSPITAL_BASED_OUTPATIENT_CLINIC_OR_DEPARTMENT_OTHER): Payer: Self-pay | Admitting: General Surgery

## 2021-11-26 ENCOUNTER — Other Ambulatory Visit: Payer: Self-pay

## 2021-11-26 ENCOUNTER — Encounter (HOSPITAL_BASED_OUTPATIENT_CLINIC_OR_DEPARTMENT_OTHER)
Admission: RE | Disposition: A | Discharge status: Home / Self Care | Payer: Self-pay | Source: Home / Self Care | Attending: General Surgery

## 2021-11-26 DIAGNOSIS — I10 Essential (primary) hypertension: Secondary | ICD-10-CM | POA: Insufficient documentation

## 2021-11-26 DIAGNOSIS — K6282 Dysplasia of anus: Secondary | ICD-10-CM | POA: Diagnosis present

## 2021-11-26 DIAGNOSIS — E119 Type 2 diabetes mellitus without complications: Secondary | ICD-10-CM | POA: Insufficient documentation

## 2021-11-26 DIAGNOSIS — Z7984 Long term (current) use of oral hypoglycemic drugs: Secondary | ICD-10-CM

## 2021-11-26 DIAGNOSIS — K219 Gastro-esophageal reflux disease without esophagitis: Secondary | ICD-10-CM | POA: Diagnosis not present

## 2021-11-26 DIAGNOSIS — G4733 Obstructive sleep apnea (adult) (pediatric): Secondary | ICD-10-CM | POA: Insufficient documentation

## 2021-11-26 DIAGNOSIS — M1990 Unspecified osteoarthritis, unspecified site: Secondary | ICD-10-CM | POA: Insufficient documentation

## 2021-11-26 DIAGNOSIS — I2699 Other pulmonary embolism without acute cor pulmonale: Secondary | ICD-10-CM

## 2021-11-26 DIAGNOSIS — F418 Other specified anxiety disorders: Secondary | ICD-10-CM | POA: Insufficient documentation

## 2021-11-26 DIAGNOSIS — M109 Gout, unspecified: Secondary | ICD-10-CM | POA: Insufficient documentation

## 2021-11-26 DIAGNOSIS — Z794 Long term (current) use of insulin: Secondary | ICD-10-CM | POA: Diagnosis not present

## 2021-11-26 DIAGNOSIS — Z79899 Other long term (current) drug therapy: Secondary | ICD-10-CM | POA: Insufficient documentation

## 2021-11-26 DIAGNOSIS — K6289 Other specified diseases of anus and rectum: Secondary | ICD-10-CM | POA: Diagnosis not present

## 2021-11-26 DIAGNOSIS — Z86711 Personal history of pulmonary embolism: Secondary | ICD-10-CM | POA: Insufficient documentation

## 2021-11-26 DIAGNOSIS — Z01818 Encounter for other preprocedural examination: Secondary | ICD-10-CM

## 2021-11-26 HISTORY — DX: Type 2 diabetes mellitus without complications: Z79.4

## 2021-11-26 HISTORY — DX: Other intervertebral disc degeneration, lumbosacral region: M51.37

## 2021-11-26 HISTORY — DX: Other specified diseases of anus and rectum: K62.89

## 2021-11-26 HISTORY — DX: Other intervertebral disc degeneration, lumbosacral region without mention of lumbar back pain or lower extremity pain: M51.379

## 2021-11-26 HISTORY — DX: Presence of spectacles and contact lenses: Z97.3

## 2021-11-26 HISTORY — DX: Unspecified osteoarthritis, unspecified site: M19.90

## 2021-11-26 HISTORY — PX: ANAL FISTULOTOMY: SHX6423

## 2021-11-26 HISTORY — DX: Diaphragmatic hernia without obstruction or gangrene: K44.9

## 2021-11-26 HISTORY — DX: Type 2 diabetes mellitus without complications: E11.9

## 2021-11-26 LAB — POCT I-STAT, CHEM 8
BUN: 33 mg/dL — ABNORMAL HIGH (ref 8–23)
Calcium, Ion: 1.17 mmol/L (ref 1.15–1.40)
Chloride: 99 mmol/L (ref 98–111)
Creatinine, Ser: 1.3 mg/dL — ABNORMAL HIGH (ref 0.44–1.00)
Glucose, Bld: 233 mg/dL — ABNORMAL HIGH (ref 70–99)
HCT: 42 % (ref 36.0–46.0)
Hemoglobin: 14.3 g/dL (ref 12.0–15.0)
Potassium: 4.9 mmol/L (ref 3.5–5.1)
Sodium: 141 mmol/L (ref 135–145)
TCO2: 30 mmol/L (ref 22–32)

## 2021-11-26 LAB — GLUCOSE, CAPILLARY
Glucose-Capillary: 200 mg/dL — ABNORMAL HIGH (ref 70–99)
Glucose-Capillary: 172 mg/dL — ABNORMAL HIGH (ref 70–99)

## 2021-11-26 SURGERY — ANAL FISTULOTOMY
Anesthesia: General | Site: Rectum

## 2021-11-26 MED ORDER — LIDOCAINE HCL (PF) 2 % IJ SOLN
INTRAMUSCULAR | Status: AC
  Filled 2021-11-26: qty 15

## 2021-11-26 MED ORDER — BUPIVACAINE-EPINEPHRINE 0.5% -1:200000 IJ SOLN
INTRAMUSCULAR | Status: AC
  Filled 2021-11-26: qty 1

## 2021-11-26 MED ORDER — DEXAMETHASONE SODIUM PHOSPHATE 10 MG/ML IJ SOLN
INTRAMUSCULAR | Status: DC | PRN
  Administered 2021-11-26: 10 mg via INTRAVENOUS

## 2021-11-26 MED ORDER — OXYCODONE HCL 5 MG PO TABS: 5.0000 mg | ORAL_TABLET | ORAL | Status: DC | PRN

## 2021-11-26 MED ORDER — SODIUM CHLORIDE 0.9% FLUSH: 3.0000 mL | INTRAVENOUS | Status: DC | PRN

## 2021-11-26 MED ORDER — OXYCODONE HCL 5 MG/5ML PO SOLN: 5.0000 mg | Freq: Once | ORAL | Status: DC | PRN

## 2021-11-26 MED ORDER — SODIUM CHLORIDE 0.9% FLUSH: 3.0000 mL | Freq: Two times a day (BID) | INTRAVENOUS | Status: DC

## 2021-11-26 MED ORDER — OXYCODONE HCL 5 MG PO TABS: 5.0000 mg | ORAL_TABLET | Freq: Once | ORAL | Status: DC | PRN

## 2021-11-26 MED ORDER — PROMETHAZINE HCL 25 MG/ML IJ SOLN: 6.2500 mg | INTRAMUSCULAR | Status: DC | PRN

## 2021-11-26 MED ORDER — ROCURONIUM BROMIDE 10 MG/ML (PF) SYRINGE
PREFILLED_SYRINGE | INTRAVENOUS | Status: AC
  Filled 2021-11-26: qty 20

## 2021-11-26 MED ORDER — DEXMEDETOMIDINE HCL IN NACL 80 MCG/20ML IV SOLN
INTRAVENOUS | Status: AC
  Filled 2021-11-26: qty 20

## 2021-11-26 MED ORDER — FENTANYL CITRATE (PF) 100 MCG/2ML IJ SOLN: 25.0000 ug | INTRAMUSCULAR | Status: DC | PRN

## 2021-11-26 MED ORDER — MIDAZOLAM HCL 2 MG/2ML IJ SOLN
INTRAMUSCULAR | Status: DC | PRN
  Administered 2021-11-26: 2 mg via INTRAVENOUS

## 2021-11-26 MED ORDER — LIDOCAINE 2% (20 MG/ML) 5 ML SYRINGE
INTRAMUSCULAR | Status: DC | PRN
  Administered 2021-11-26: 60 mg via INTRAVENOUS

## 2021-11-26 MED ORDER — INSULIN ASPART 100 UNIT/ML IJ SOLN
INTRAMUSCULAR | Status: AC
  Filled 2021-11-26: qty 1

## 2021-11-26 MED ORDER — ACETAMINOPHEN 325 MG RE SUPP: 650.0000 mg | RECTAL | Status: DC | PRN

## 2021-11-26 MED ORDER — ONDANSETRON HCL 4 MG/2ML IJ SOLN
INTRAMUSCULAR | Status: DC | PRN
  Administered 2021-11-26: 4 mg via INTRAVENOUS

## 2021-11-26 MED ORDER — PROPOFOL 10 MG/ML IV BOLUS
INTRAVENOUS | Status: DC | PRN
  Administered 2021-11-26: 170 mg via INTRAVENOUS

## 2021-11-26 MED ORDER — PROPOFOL 10 MG/ML IV BOLUS
INTRAVENOUS | Status: AC
  Filled 2021-11-26: qty 20

## 2021-11-26 MED ORDER — 0.9 % SODIUM CHLORIDE (POUR BTL) OPTIME
TOPICAL | Status: DC | PRN
  Administered 2021-11-26: 1000 mL

## 2021-11-26 MED ORDER — MIDAZOLAM HCL 2 MG/2ML IJ SOLN
INTRAMUSCULAR | Status: AC
  Filled 2021-11-26: qty 2

## 2021-11-26 MED ORDER — ACETAMINOPHEN 325 MG PO TABS: 650.0000 mg | ORAL_TABLET | ORAL | Status: DC | PRN

## 2021-11-26 MED ORDER — LACTATED RINGERS IV SOLN: INTRAVENOUS | Status: DC

## 2021-11-26 MED ORDER — BUPIVACAINE-EPINEPHRINE 0.5% -1:200000 IJ SOLN
INTRAMUSCULAR | Status: DC | PRN
  Administered 2021-11-26: 38 mL

## 2021-11-26 MED ORDER — FENTANYL CITRATE (PF) 100 MCG/2ML IJ SOLN
INTRAMUSCULAR | Status: AC
  Filled 2021-11-26: qty 2

## 2021-11-26 MED ORDER — INSULIN ASPART 100 UNIT/ML IJ SOLN
5.0000 [IU] | Freq: Once | INTRAMUSCULAR | Status: AC
Start: 2021-11-26 — End: 2021-11-26
  Administered 2021-11-26: 5 [IU] via SUBCUTANEOUS

## 2021-11-26 MED ORDER — ACETAMINOPHEN 500 MG PO TABS
ORAL_TABLET | ORAL | Status: AC
  Filled 2021-11-26: qty 2

## 2021-11-26 MED ORDER — ONDANSETRON HCL 4 MG/2ML IJ SOLN
INTRAMUSCULAR | Status: AC
  Filled 2021-11-26: qty 2

## 2021-11-26 MED ORDER — SODIUM CHLORIDE 0.9 % IV SOLN: 250.0000 mL | INTRAVENOUS | Status: DC | PRN

## 2021-11-26 MED ORDER — ACETAMINOPHEN 500 MG PO TABS
1000.0000 mg | ORAL_TABLET | Freq: Once | ORAL | Status: AC
  Administered 2021-11-26: 1000 mg via ORAL

## 2021-11-26 MED ORDER — PROPOFOL 1000 MG/100ML IV EMUL
INTRAVENOUS | Status: AC
  Filled 2021-11-26: qty 100

## 2021-11-26 MED ORDER — FENTANYL CITRATE (PF) 250 MCG/5ML IJ SOLN
INTRAMUSCULAR | Status: DC | PRN
  Administered 2021-11-26: 50 ug via INTRAVENOUS

## 2021-11-26 MED ORDER — DEXAMETHASONE SODIUM PHOSPHATE 10 MG/ML IJ SOLN
INTRAMUSCULAR | Status: AC
  Filled 2021-11-26: qty 2

## 2021-11-26 SURGICAL SUPPLY — 48 items
APL SKNCLS STERI-STRIP NONHPOA (GAUZE/BANDAGES/DRESSINGS) ×2
BENZOIN TINCTURE PRP APPL 2/3 (GAUZE/BANDAGES/DRESSINGS) ×2 IMPLANT
BLADE EXTENDED COATED 6.5IN (ELECTRODE) IMPLANT
BLADE SURG 10 STRL SS (BLADE) IMPLANT
COVER BACK TABLE 60X90IN (DRAPES) ×1 IMPLANT
COVER MAYO STAND STRL (DRAPES) ×1 IMPLANT
DECANTER SPIKE VIAL GLASS SM (MISCELLANEOUS) ×1 IMPLANT
DRAPE LAPAROTOMY 100X72 PEDS (DRAPES) ×1 IMPLANT
DRAPE UTILITY XL STRL (DRAPES) ×1 IMPLANT
DRSG PAD ABDOMINAL 8X10 ST (GAUZE/BANDAGES/DRESSINGS) ×1 IMPLANT
ELECT REM PT RETURN 9FT ADLT (ELECTROSURGICAL) ×1
ELECTRODE REM PT RTRN 9FT ADLT (ELECTROSURGICAL) ×1 IMPLANT
GAUZE 4X4 16PLY ~~LOC~~+RFID DBL (SPONGE) ×1 IMPLANT
GAUZE SPONGE 4X4 12PLY STRL (GAUZE/BANDAGES/DRESSINGS) ×1 IMPLANT
GLOVE BIO SURGEON STRL SZ 6.5 (GLOVE) ×1 IMPLANT
GLOVE BIOGEL PI IND STRL 7.0 (GLOVE) ×1 IMPLANT
GLOVE BIOGEL PI INDICATOR 7.0 (GLOVE) ×1
GLOVE INDICATOR 6.5 STRL GRN (GLOVE) ×1 IMPLANT
GOWN STRL REUS W/TWL XL LVL3 (GOWN DISPOSABLE) ×1 IMPLANT
HYDROGEN PEROXIDE 16OZ (MISCELLANEOUS) ×1 IMPLANT
IV CATH 14GX2 1/4 (CATHETERS) ×1 IMPLANT
IV CATH 18G SAFETY (IV SOLUTION) ×1 IMPLANT
KIT SIGMOIDOSCOPE (SET/KITS/TRAYS/PACK) IMPLANT
KIT TURNOVER CYSTO (KITS) ×1 IMPLANT
LOOP VESSEL MAXI BLUE (MISCELLANEOUS) IMPLANT
NEEDLE HYPO 22GX1.5 SAFETY (NEEDLE) ×1 IMPLANT
NS IRRIG 500ML POUR BTL (IV SOLUTION) ×1 IMPLANT
PACK BASIN DAY SURGERY FS (CUSTOM PROCEDURE TRAY) ×1 IMPLANT
PAD ARMBOARD 7.5X6 YLW CONV (MISCELLANEOUS) IMPLANT
PANTS MESH DISP LRG (UNDERPADS AND DIAPERS) ×1 IMPLANT
PENCIL SMOKE EVACUATOR (MISCELLANEOUS) ×1 IMPLANT
SPONGE HEMORRHOID 8X3CM (HEMOSTASIS) IMPLANT
SPONGE SURGIFOAM ABS GEL 12-7 (HEMOSTASIS) IMPLANT
SUCTION FRAZIER HANDLE 10FR (MISCELLANEOUS)
SUCTION TUBE FRAZIER 10FR DISP (MISCELLANEOUS) IMPLANT
SUT CHROMIC 2 0 SH (SUTURE) IMPLANT
SUT CHROMIC 3 0 SH 27 (SUTURE) IMPLANT
SUT ETHIBOND 0 (SUTURE) IMPLANT
SUT VIC AB 2-0 SH 27 (SUTURE)
SUT VIC AB 2-0 SH 27XBRD (SUTURE) IMPLANT
SUT VIC AB 3-0 SH 18 (SUTURE) IMPLANT
SUT VIC AB 3-0 SH 27 (SUTURE)
SUT VIC AB 3-0 SH 27XBRD (SUTURE) IMPLANT
SYR CONTROL 10ML LL (SYRINGE) ×1 IMPLANT
TOWEL OR 17X26 10 PK STRL BLUE (TOWEL DISPOSABLE) ×1 IMPLANT
TRAY DSU PREP LF (CUSTOM PROCEDURE TRAY) ×1 IMPLANT
TUBE CONNECTING 12X1/4 (SUCTIONS) ×1 IMPLANT
YANKAUER SUCT BULB TIP NO VENT (SUCTIONS) ×1 IMPLANT

## 2021-11-26 NOTE — Anesthesia Postprocedure Evaluation (Signed)
Anesthesia Post Note  Patient: Akshita Italiano  Procedure(s) Performed: EXCISION OF ANAL PAPILLA (Rectum)     Patient location during evaluation: PACU Anesthesia Type: General Level of consciousness: awake and alert Pain management: pain level controlled Vital Signs Assessment: post-procedure vital signs reviewed and stable Respiratory status: spontaneous breathing, nonlabored ventilation and respiratory function stable Cardiovascular status: stable and blood pressure returned to baseline Anesthetic complications: no   No notable events documented.  Last Vitals:  Vitals:   11/26/21 1200 11/26/21 1234  BP: (!) 141/75 (!) 128/54  Pulse: 71 72  Resp: 19 18  Temp:  36.4 C  SpO2: 97% 96%    Last Pain:  Vitals:   11/26/21 1234  TempSrc:   PainSc: 0-No pain                 Audry Pili

## 2021-11-26 NOTE — Transfer of Care (Signed)
Immediate Anesthesia Transfer of Care Note  Patient: Maria [name]  Procedure(s) Performed: EXCISION OF ANAL PAPILLA (Rectum)  Patient Location: PACU  Anesthesia Type:General  Level of Consciousness: awake, alert  and oriented  Airway & Oxygen Therapy: Patient Spontanous Breathing  Post-op Assessment: Report given to RN and Post -op Vital signs reviewed and stable  Post vital signs: Reviewed and stable  Last Vitals:  Vitals Value Taken Time  BP 176/76 11/26/21 1131  Temp    Pulse 74 11/26/21 1133  Resp 20 11/26/21 1133  SpO2 100 % 11/26/21 1133  Vitals shown include unvalidated device data.  Last Pain:  Vitals:   11/26/21 0755  TempSrc: Oral  PainSc: 0-No pain      Patients Stated Pain Goal: 5 (30/74/60 0298)  Complications: No notable events documented.

## 2021-11-26 NOTE — H&P (Signed)
REFERRING PHYSICIAN:  Koleen Distance, MD   PROVIDER:  Monico Blitz, MD   MRN: [id number] DOB: June 06, 1960    Subjective    Chief Complaint: new problem (Anal papilla )       History of Present Illness: Maria [name] is a 61 y.o. female who is seen today as an office consultation at the request of Dr. Alan Ripper for evaluation of new problem (Anal papilla ) .  61 year old female who presents to the office with complaints of an anal mass.  She has been having frequent loose stools and has been evaluated by Dr. Alan Ripper.  An anal papilla was noted at that time.  She is here today for evaluation for excision.  She describes the papilla as painful and difficult to clean.     Review of Systems: A complete review of systems was obtained from the patient.  I have reviewed this information and discussed as appropriate with the patient.  See HPI as well for other ROS.       Medical History: Past Medical History         Past Medical History:  Diagnosis Date   Arthritis     Diabetes mellitus without complication (CMS-HCC)     GERD (gastroesophageal reflux disease)     Hypertension               Patient Active Problem List  Diagnosis   Morbid obesity (CMS-HCC)   Type 2 or unspecified type diabetes mellitus (CMS-HCC)   S/P TKR (total knee replacement)   S/P laparoscopic sleeve gastrectomy   Other disorder of menstruation and other abnormal bleeding from female genital tract   Obstructive sleep apnea   Osteoarthritis of left hip   Hyperlipidemia   Achilles tendinitis of right lower extremity   Allergic rhinitis   Anxiety state, unspecified   Back pain   Carpal tunnel syndrome on left   Cervical disc disorder with radiculopathy of cervical region   Closed fracture of fourth toe of left foot   Costochondritis   Depression   Diabetes type 2, uncontrolled   Rotator cuff syndrome   Right upper quadrant pain   Recurrent nephrolithiasis   Radicular pain in left arm    Panniculitis   Hemorrhoids, external   Gout, unspecified   Essential hypertension   Esophageal reflux      Past Surgical History           Past Surgical History:  Procedure Laterality Date   CHOLECYSTECTOMY       gastric sleeve       REMOVAL BLOOD CLOT FROM ANTERIOR SEGMENT            Allergies  No Known Allergies                 Current Outpatient Medications on File Prior to Visit  Medication Sig Dispense Refill   aspirin 81 MG EC tablet Take 81 mg by mouth once daily       benazepriL (LOTENSIN) 40 MG tablet Take 40 mg by mouth once daily       colchicine (COLCRYS) 0.6 mg tablet TAKE 2 TABLETS BY MOUTH AT ONSET OF GOUT ATTACK, THEN 1 TABLET 1 HOURS LATER.       FUROsemide (LASIX) 20 MG tablet Take by mouth       gabapentin (NEURONTIN) 400 MG capsule Take 400 mg by mouth 4 (four) times daily       glipiZIDE (GLUCOTROL) 5 MG tablet Take 5 mg by  mouth every morning       metFORMIN (GLUCOPHAGE) 850 MG tablet Take 850 mg by mouth 2 (two) times daily       metoprolol succinate (TOPROL-XL) 200 MG XL tablet Take 200 mg by mouth once daily       phentermine (ADIPEX-P) 15 MG capsule Take 15 mg by mouth once daily        No current facility-administered medications on file prior to visit.      Family History           Family History  Problem Relation Age of Onset   Obesity Mother     High blood pressure (Hypertension) Mother     Diabetes Mother          Social History         Tobacco Use  Smoking Status Never  Smokeless Tobacco Never      Social History  Social History           Socioeconomic History   Marital status: Divorced  Tobacco Use   Smoking status: Never   Smokeless tobacco: Never  Substance and Sexual Activity   Alcohol use: Never   Drug use: Never        Objective:      There were no vitals filed for this visit.    Exam Gen: NAD CV: RRR Lungs: CTA Abd: soft Rectal: Anal papilla arising from what appears to be the anterior midline anal  canal, tender to palpation     Labs, Imaging and Diagnostic Testing:     Assessment and Plan:  Diagnoses and all orders for this visit:   Hypertrophied anal papilla   61 year old female who presented to the office for evaluation of an anal papilla seen on recent flexible sigmoidoscopy.  On exam today this appears to be arising from the anterior midline anal canal on a small stalk.  I have recommended surgical excision.       Rosario Adie, MD Colon and Rectal Surgery [facility]

## 2021-11-26 NOTE — Anesthesia Procedure Notes (Signed)
Procedure Name: LMA Insertion Date/Time: 11/26/2021 11:04 AM  Performed by: Clearnce Sorrel, CRNAPre-anesthesia Checklist: Patient identified, Emergency Drugs available, Suction available and Patient being monitored Patient Re-evaluated:Patient Re-evaluated prior to induction Oxygen Delivery Method: Circle System Utilized Preoxygenation: Pre-oxygenation with 100% oxygen Induction Type: IV induction Ventilation: Mask ventilation without difficulty LMA: LMA inserted LMA Size: 4.0 Number of attempts: 1 Airway Equipment and Method: Bite block Placement Confirmation: positive ETCO2 Tube secured with: Tape Dental Injury: Teeth and Oropharynx as per pre-operative assessment

## 2021-11-26 NOTE — Op Note (Signed)
11/26/2021  11:26 AM  PATIENT:  Maria [name]  61 y.o. female  Patient Care Team: Antonietta Jewel, MD as PCP - General (Internal Medicine)  PRE-OPERATIVE DIAGNOSIS:  anal papilla  POST-OPERATIVE DIAGNOSIS:  anal papilla  PROCEDURE:  EXCISION OF ANAL PAPILLA    Surgeon(s): Leighton Ruff, MD  ASSISTANT: Gwynn Burly, MD   ANESTHESIA:   local and MAC  SPECIMEN:  Source of Specimen:  anal papilla  DISPOSITION OF SPECIMEN:  PATHOLOGY  COUNTS:  YES  PLAN OF CARE: Discharge to home after PACU  PATIENT DISPOSITION:  PACU - hemodynamically stable.  INDICATION: 61 y.o. F with chronic prolapsing anal papilla   OR FINDINGS: mild sphincter hypertension, R post anal papilla  DESCRIPTION: the patient was identified in the preoperative holding area and taken to the OR where they were laid on the operating room table.  MAC anesthesia was induced without difficulty. The patient was then positioned in prone jackknife position with buttocks gently taped apart.  The patient was then prepped and draped in usual sterile fashion.  SCDs were noted to be in place prior to the initiation of anesthesia. A surgical timeout was performed indicating the correct patient, procedure, positioning and need for preoperative antibiotics.  A rectal block was performed using Marcaine with epinephrine.    I began with a digital rectal exam.  The anal canal was gently dilated to 2 fingerbreadths.  I then placed a Hill-Ferguson anoscope into the anal canal and evaluated this completely.  The patient had grade 1 hemorrhoids at all 3 locations with some external hemorrhoid tissue noted.  There was an anal papilla at the left posterior anal canal.  This was excised using electrocautery.  Anal mucosa was closed with interrupted 2-0 chromic sutures for hemostasis.  Additional Marcaine was applied around the incision site.  A dressing was applied.  The patient was then awakened from anesthesia and sent to the postanesthesia care  unit in stable condition.  I was personally present during the key and critical portions of this procedure and immediately available throughout the entire procedure, as documented in my operative note.   Rosario Adie, MD  Colorectal and [place] [facility]

## 2021-11-26 NOTE — Discharge Instructions (Addendum)
Beginning the day after surgery:  You may sit in a tub of warm water 2-3 times a day to relieve discomfort.  Eat a regular diet high in fiber.  Avoid foods that give you constipation or diarrhea.  Avoid foods that are difficult to digest, such as seeds, nuts, corn or popcorn.  Do not go any longer than 2 days without a bowel movement.  You may take a dose of Milk of Magnesia if you become constipated.    Drink 6-8 glasses of water daily.  Walking is encouraged.  Avoid strenuous activity and heavy lifting for one month after surgery.    Call the office if you have any questions or concerns.  Call immediately if you develop:  Excessive rectal bleeding (more than a cup or passing large clots) Increased discomfort Fever greater than 100 F Difficulty urinating    Post Anesthesia Home Care Instructions  Activity: Get plenty of rest for the remainder of the day. A responsible individual must stay with you for 24 hours following the procedure.  For the next 24 hours, DO NOT: -Drive a car -Paediatric nurse -Drink alcoholic beverages -Take any medication unless instructed by your physician -Make any legal decisions or sign important papers.  Meals: Start with liquid foods such as gelatin or soup. Progress to regular foods as tolerated. Avoid greasy, spicy, heavy foods. If nausea and/or vomiting occur, drink only clear liquids until the nausea and/or vomiting subsides. Call your physician if vomiting continues.  Special Instructions/Symptoms: Your throat may feel dry or sore from the anesthesia or the breathing tube placed in your throat during surgery. If this causes discomfort, gargle with warm salt water. The discomfort should disappear within 24 hours.  No acetaminophen/Tylenol until after 1:30 pm today if needed.

## 2021-11-26 NOTE — Anesthesia Preprocedure Evaluation (Addendum)
Anesthesia Evaluation  Patient identified by MRN, date of birth, ID band Patient awake    Reviewed: Allergy & Precautions, NPO status , Patient's Chart, lab work & pertinent test results, reviewed documented beta blocker date and time   History of Anesthesia Complications Negative for: history of anesthetic complications  Airway Mallampati: II  TM Distance: >3 FB Neck ROM: Full    Dental  (+) Dental Advisory Given, Teeth Intact   Pulmonary sleep apnea , PE   Pulmonary exam normal        Cardiovascular hypertension, Pt. on medications and Pt. on home beta blockers Normal cardiovascular exam     Neuro/Psych PSYCHIATRIC DISORDERS Anxiety Depression negative neurological ROS     GI/Hepatic Neg liver ROS, hiatal hernia, GERD  Medicated and Controlled,  Endo/Other  diabetes, Type 2, Insulin Dependent, Oral Hypoglycemic AgentsMorbid obesity  Renal/GU negative Renal ROS     Musculoskeletal  (+) Arthritis ,  Gout    Abdominal   Peds  Hematology negative hematology ROS (+)   Anesthesia Other Findings   Reproductive/Obstetrics  DUB s/p tubal ligation                             Anesthesia Physical Anesthesia Plan  ASA: 3  Anesthesia Plan: General   Post-op Pain Management: Tylenol PO (pre-op)*   Induction: Intravenous  PONV Risk Score and Plan: 3 and Treatment may vary due to age or medical condition, Ondansetron, Dexamethasone and Midazolam  Airway Management Planned: LMA  Additional Equipment: None  Intra-op Plan:   Post-operative Plan: Extubation in OR  Informed Consent: I have reviewed the patients History and Physical, chart, labs and discussed the procedure including the risks, benefits and alternatives for the proposed anesthesia with the patient or authorized representative who has indicated his/her understanding and acceptance.     Dental advisory given  Plan  Discussed with: CRNA and Anesthesiologist  Anesthesia Plan Comments:        Anesthesia Quick Evaluation

## 2021-11-29 ENCOUNTER — Encounter (HOSPITAL_BASED_OUTPATIENT_CLINIC_OR_DEPARTMENT_OTHER): Payer: Self-pay | Admitting: General Surgery

## 2021-11-30 ENCOUNTER — Other Ambulatory Visit: Payer: Self-pay | Admitting: General Surgery

## 2021-11-30 ENCOUNTER — Telehealth: Payer: Self-pay | Admitting: General Surgery

## 2021-11-30 NOTE — Telephone Encounter (Signed)
Opened in error

## 2021-12-01 LAB — SURGICAL PATHOLOGY

## 2021-12-03 ENCOUNTER — Other Ambulatory Visit: Payer: Self-pay | Admitting: Sports Medicine

## 2021-12-07 ENCOUNTER — Telehealth: Payer: Self-pay

## 2021-12-07 NOTE — Telephone Encounter (Signed)
Advised patient to contact PCP, states she will contact them now.

## 2021-12-07 NOTE — Telephone Encounter (Signed)
Patient states she is on the schedule for a follow up to see if her A1C is good but she hasnt had it done, wondering if we can place an order for it so she can have it done.

## 2021-12-09 ENCOUNTER — Ambulatory Visit (INDEPENDENT_AMBULATORY_CARE_PROVIDER_SITE_OTHER): Payer: PPO | Admitting: Surgical

## 2021-12-09 DIAGNOSIS — M793 Panniculitis, unspecified: Secondary | ICD-10-CM

## 2021-12-09 DIAGNOSIS — E119 Type 2 diabetes mellitus without complications: Secondary | ICD-10-CM

## 2021-12-09 NOTE — Progress Notes (Addendum)
   Referring Provider Antonietta Jewel, MD [address] [place],  [place] [address]   CC: No chief complaint on file.    Maria [name] is an 61 y.o. female.  HPI: Patient is a 61 year old female with a history of panniculitis, she was initially scheduled for this however this was postponed due to her A1c being elevated above 7.5.  She presents for telephone visit to further discuss this, she reports she has not had her A1c drawn since our last appointment on 09/08/2021.  She reports her PCP is out of town at this time and unable to order.  She reports she is on new medications for her diabetes and feels as if it is much improved.  The patient gave consent to have this visit done by telemedicine / virtual visit, two identifiers were used to identify patient. This is also consent for access the chart and treat the patient via this visit. The patient is located in [place].  I, the provider, am at the office.  We spent 4 minutes together for the visit.  Joined by telephone.   Physical Exam    11/26/2021   12:34 PM 11/26/2021   12:00 PM 11/26/2021   11:52 AM  Vitals with BMI  Systolic [phone]  Diastolic 54 75 67  Pulse 72 71 74    General:  No acute distress Psych: Normal mood    Assessment/Plan We will place A1c order, will plan to discuss with patient in 1 week after her lab is drawn. Patient is aware she will need to pick up order for [facility] here at our office.  She is going to call us after her labs have been drawn and we will schedule a 1 week follow-up    Charlies Constable 12/09/2021, 10:18 AM

## 2021-12-18 ENCOUNTER — Other Ambulatory Visit: Payer: Self-pay | Admitting: Sports Medicine

## 2021-12-22 ENCOUNTER — Other Ambulatory Visit: Payer: Self-pay | Admitting: Sports Medicine

## 2022-02-11 ENCOUNTER — Ambulatory Visit: Payer: PPO | Admitting: Family Medicine

## 2022-02-15 ENCOUNTER — Ambulatory Visit: Payer: PPO | Admitting: Sports Medicine

## 2022-02-16 ENCOUNTER — Ambulatory Visit: Payer: PPO | Admitting: Family Medicine

## 2022-02-19 ENCOUNTER — Other Ambulatory Visit (HOSPITAL_BASED_OUTPATIENT_CLINIC_OR_DEPARTMENT_OTHER): Payer: Self-pay

## 2022-02-24 ENCOUNTER — Ambulatory Visit (INDEPENDENT_AMBULATORY_CARE_PROVIDER_SITE_OTHER): Payer: PPO | Admitting: Sports Medicine

## 2022-02-24 VITALS — BP 149/75 | Ht 68.0 in | Wt 299.0 lb

## 2022-02-24 DIAGNOSIS — M25512 Pain in left shoulder: Secondary | ICD-10-CM | POA: Diagnosis not present

## 2022-02-24 MED ORDER — METHYLPREDNISOLONE ACETATE 40 MG/ML IJ SUSP
40.0000 mg | Freq: Once | INTRAMUSCULAR | Status: AC
  Administered 2022-02-24: 40 mg via INTRA_ARTICULAR

## 2022-02-24 NOTE — Progress Notes (Signed)
   Subjective:    Patient ID: Maria [name], female    DOB: 1960-12-28, 61 y.o.   MRN: [id number]  HPI chief complaint: Left shoulder pain  Patient presents today with persistent left shoulder pain.  She was last seen in the office 5 months ago.  An x-ray of the shoulder was ordered but was never performed.  She is now getting some pain radiating up into her neck.  Her last subacromial cortisone injection provided her with little pain relief but she would like to try a second injection today.  No recent trauma.    Review of Systems As above    Objective:   Physical Exam  Well-developed, well-nourished.  No acute distress  Left shoulder: Active forward flexion is to 120 degrees.  Active abduction is to 90 degrees.  Internal rotation is limited to 70 degrees.  Good passive and active external rotation.  She does have some mild strength loss with rotator cuff stressing.  Neurovascularly intact distally.      Assessment & Plan:   Chronic left shoulder pain secondary to glenohumeral osteoarthritis versus rotator cuff tear  This is a chronic issue for the patient.  Subacromial cortisone injection administered as below without difficulty.  We will reorder x-rays including an axillary view and I will follow-up with her via telephone with those results when available.  If x-rays do not show significant glenohumeral OA then I would recommend proceeding with an MRI to rule out rotator cuff tear.  Consent obtained and verified. Time-out conducted. Noted no overlying erythema, induration, or other signs of local infection. Skin prepped in a sterile fashion. Topical analgesic spray: Ethyl chloride. Joint: Left shoulder, subacromial Needle: 25-gauge 1.5 inch Completed without difficulty. Meds: 3 cc 1% Xylocaine, 1 cc (40 mg) Depo-Medrol   This note was dictated using Dragon naturally speaking software and may contain errors in syntax, spelling, or content which have not been identified prior  to signing this note.

## 2022-03-07 ENCOUNTER — Ambulatory Visit
Admission: RE | Admit: 2022-03-07 | Discharge: 2022-03-07 | Disposition: A | Discharge status: Home / Self Care | Payer: PPO | Source: Ambulatory Visit | Attending: Sports Medicine | Admitting: Sports Medicine

## 2022-03-07 DIAGNOSIS — M25512 Pain in left shoulder: Secondary | ICD-10-CM

## 2022-03-08 ENCOUNTER — Other Ambulatory Visit: Payer: Self-pay

## 2022-03-08 DIAGNOSIS — M25512 Pain in left shoulder: Secondary | ICD-10-CM

## 2022-03-08 NOTE — Progress Notes (Signed)
Pt called asking for xray results.  Per Dr. Micheline Chapman: xray showed some arthritis but it's not terrible. Recommends MRI to rule out rotator cuff tear. Pt agrees with plan. Order placed. She also asked for medicine for MRI for claustrophobia.

## 2022-03-09 ENCOUNTER — Encounter: Payer: Self-pay | Admitting: *Deleted

## 2022-03-09 ENCOUNTER — Telehealth: Payer: Self-pay | Admitting: Sports Medicine

## 2022-03-09 ENCOUNTER — Other Ambulatory Visit: Payer: Self-pay | Admitting: Sports Medicine

## 2022-03-09 MED ORDER — DIAZEPAM 5 MG PO TABS: ORAL_TABLET | ORAL | 0 refills | Status: DC

## 2022-03-09 NOTE — Telephone Encounter (Signed)
  X-rays reviewed.  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  There is bulky AC osteoarthritis as well.  I recommended proceeding with an MRI to rule out a rotator cuff tear that may need operative intervention given her failure with conservative treatment to date.  I will follow-up with her with those results when available and we will delineate further treatment based on those findings.  This note was dictated using Dragon naturally speaking software and may contain errors in syntax, spelling, or content which have not been identified prior to signing this note.

## 2022-03-13 ENCOUNTER — Other Ambulatory Visit: Payer: PPO

## 2022-03-16 ENCOUNTER — Ambulatory Visit
Admission: RE | Admit: 2022-03-16 | Discharge: 2022-03-16 | Disposition: A | Discharge status: Home / Self Care | Payer: PPO | Source: Ambulatory Visit | Attending: Sports Medicine | Admitting: Sports Medicine

## 2022-03-16 DIAGNOSIS — M25512 Pain in left shoulder: Secondary | ICD-10-CM

## 2022-03-22 ENCOUNTER — Telehealth: Payer: Self-pay

## 2022-03-22 DIAGNOSIS — M25512 Pain in left shoulder: Secondary | ICD-10-CM

## 2022-03-22 NOTE — Telephone Encounter (Signed)
Order placed for CT scan of left shoulder. Pt will call [facility] to schedule this.

## 2022-03-22 NOTE — Telephone Encounter (Signed)
-----   Message from Carolyne Littles sent at 03/22/2022  9:02 AM EST ----- Regarding: phone message Pt couldn't complete MRI even with valium. She is asking if she can get a CT scan instead?

## 2022-03-23 ENCOUNTER — Telehealth: Payer: Self-pay | Admitting: Plastic Surgery

## 2022-03-23 NOTE — Telephone Encounter (Signed)
Would like a call back about getting orders for her A1C to be checked as discussion was at her last appt.

## 2022-03-25 ENCOUNTER — Telehealth (INDEPENDENT_AMBULATORY_CARE_PROVIDER_SITE_OTHER): Payer: PPO | Admitting: Surgical

## 2022-03-25 ENCOUNTER — Encounter: Payer: Self-pay | Admitting: Surgical

## 2022-03-25 DIAGNOSIS — E119 Type 2 diabetes mellitus without complications: Secondary | ICD-10-CM

## 2022-03-25 DIAGNOSIS — M546 Pain in thoracic spine: Secondary | ICD-10-CM | POA: Diagnosis not present

## 2022-03-25 DIAGNOSIS — G8929 Other chronic pain: Secondary | ICD-10-CM | POA: Diagnosis not present

## 2022-03-25 DIAGNOSIS — M793 Panniculitis, unspecified: Secondary | ICD-10-CM

## 2022-03-25 NOTE — Progress Notes (Signed)
   Referring Provider Antonietta Jewel, MD [address] [place],  [place] [address]   CC: No chief complaint on file.     Kameka Whan is an 61 y.o. female.  HPI: Patient is a 61 year old female here for follow-up to discuss surgical planning for panniculectomy with Dr. Marla Roe.  She was initially scheduled for surgery, however surgery was required to be postponed due to her elevated A1c.  At 1 point it was 11%, but she reports more recently it was 8%.  She has not had her A1c checked in quite some time and would like to know if I can order her an A1c for [place] to check her levels prior to planning for surgery.  The patient gave consent to have this visit done by telemedicine / virtual visit, two identifiers were used to identify patient. This is also consent for access the chart and treat the patient via this visit. The patient is located in [place].  I, the provider, am at the office.  A total of 5 minutes was spent on today's encounter.  Joined by telephone.  Patient reports that she had a UTI in September which has resolved, however she reports it did take her a while to recover from this.  She reports she has lost some weight since her last appointment, approximately 20 pounds.  Review of Systems General: No fevers or chills  Physical Exam    02/24/2022   11:05 AM 11/26/2021   12:34 PM 11/26/2021   12:00 PM  Vitals with BMI  Height '5\' 8"'$     Weight 299 lbs    BMI 97.41    Systolic [phone]  Diastolic 75 54 75  Pulse  72 71    Psych: Normal mood  Assessment/Plan Patient is a 61 year old female who presents via telephone virtual visit to discuss surgical planning for panniculectomy.  We discussed her A1c would need to be less than 7.5 I placed an order for patient to get her A1c at [facility].  Will print out order requisition and have front desk provide patient.  All of her questions were answered to her content.  We will plan to follow-up after she completes her A1c  to further discuss.   Carola Rhine Zacory Fiola 03/25/2022, 1:11 PM

## 2022-03-28 ENCOUNTER — Other Ambulatory Visit: Payer: PPO

## 2022-03-30 ENCOUNTER — Other Ambulatory Visit: Payer: PPO

## 2022-04-06 ENCOUNTER — Other Ambulatory Visit: Payer: PPO

## 2022-04-07 ENCOUNTER — Other Ambulatory Visit: Payer: PPO

## 2022-04-13 ENCOUNTER — Other Ambulatory Visit: Payer: PPO

## 2022-04-14 ENCOUNTER — Telehealth: Payer: Self-pay

## 2022-04-14 NOTE — Telephone Encounter (Signed)
Called pt to confirm if she had intentions to complete A1C lab that was ordered for her during her video visit on 12/15 and # was invalid. Not able to leave a voicemail.

## 2022-07-25 ENCOUNTER — Other Ambulatory Visit: Payer: Self-pay | Admitting: Family Medicine

## 2022-07-25 DIAGNOSIS — M109 Gout, unspecified: Secondary | ICD-10-CM

## 2022-08-12 ENCOUNTER — Ambulatory Visit: Payer: PPO | Admitting: Family Medicine

## 2022-08-16 ENCOUNTER — Ambulatory Visit (INDEPENDENT_AMBULATORY_CARE_PROVIDER_SITE_OTHER): Payer: PPO | Admitting: Sports Medicine

## 2022-08-16 VITALS — BP 136/88 | Ht 68.0 in | Wt 289.0 lb

## 2022-08-16 DIAGNOSIS — M25551 Pain in right hip: Secondary | ICD-10-CM

## 2022-08-16 MED ORDER — GABAPENTIN 300 MG PO CAPS
ORAL_CAPSULE | ORAL | 1 refills | Status: DC
Start: 2022-08-16 — End: 2022-11-04

## 2022-08-16 MED ORDER — KETOROLAC TROMETHAMINE 60 MG/2ML IM SOLN
60.0000 mg | Freq: Once | INTRAMUSCULAR | Status: AC
  Administered 2022-08-16: 60 mg via INTRAMUSCULAR

## 2022-08-16 MED ORDER — METHOCARBAMOL 500 MG PO TABS: 500.0000 mg | ORAL_TABLET | Freq: Three times a day (TID) | ORAL | 1 refills | Status: DC | PRN

## 2022-08-16 MED ORDER — METHYLPREDNISOLONE ACETATE 80 MG/ML IJ SUSP
80.0000 mg | Freq: Once | INTRAMUSCULAR | Status: AC
  Administered 2022-08-16: 80 mg via INTRAMUSCULAR

## 2022-08-16 NOTE — Progress Notes (Signed)
   Subjective:    Patient ID: Maria [name], female    DOB: 11-17-1960, 62 y.o.   MRN: [id number]  HPI chief complaint: Right hip pain  Patient presents today complaining of right hip pain has been present for a couple of weeks.  Pain begins in the posterior hip but radiates into the groin.  It does not radiate down the leg like her previous radiculopathy.  Pain is much more noticeable with weightbearing.  Pain has caused her to fall and injure her right.  No imaging of the right hip has been done in the past but an MRI of the left hip done previously did show some moderate arthritic changes.  She is also requesting a referral for a new pain management provider.  Her previous pain management doctor has been "audited" per the patient's report and is no longer prescribing chronic pain medicine.  She would like a referral for Dr.Heag.    Review of Systems As above    Objective:   Physical Exam  Well-developed, well-nourished.  No acute distress  Right hip: Full passive range of motion but reproducible groin pain with internal rotation.  No significant tenderness to palpation.  Neurovascularly intact distally.      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  I did discuss the possibility of a diagnostic/therapeutic intra-articular right hip injection to be done at [facility] imaging if arthritis is confirmed.  She is injected today with 80 mg of IM Depo-Medrol and 60 mg of Toradol IM.  I will also make a referral to Dr. Eather Colas.  In the meantime, I have agreed to refill her gabapentin 300 mg 3 times daily and Robaxin 300 mg 3 times daily until she can get established.  This note was dictated using Dragon naturally speaking software and may contain errors in syntax, spelling, or content which have not been identified prior to signing this note.

## 2022-08-18 ENCOUNTER — Ambulatory Visit: Payer: PPO | Admitting: Sports Medicine

## 2022-08-25 ENCOUNTER — Ambulatory Visit
Admission: RE | Admit: 2022-08-25 | Discharge: 2022-08-25 | Disposition: A | Discharge status: Home / Self Care | Payer: PPO | Source: Ambulatory Visit | Attending: Sports Medicine | Admitting: Sports Medicine

## 2022-08-25 DIAGNOSIS — M25551 Pain in right hip: Secondary | ICD-10-CM

## 2022-08-29 ENCOUNTER — Other Ambulatory Visit: Payer: Self-pay | Admitting: Internal Medicine

## 2022-08-29 DIAGNOSIS — Z1231 Encounter for screening mammogram for malignant neoplasm of breast: Secondary | ICD-10-CM

## 2022-08-30 ENCOUNTER — Other Ambulatory Visit: Payer: Self-pay

## 2022-08-30 DIAGNOSIS — M25551 Pain in right hip: Secondary | ICD-10-CM

## 2022-08-30 NOTE — Progress Notes (Signed)
Discussed xray results with pt. Dr. Margaretha Sheffield recommends referral to Dr. Magnus Ivan to discuss treatment options. Referral placed.

## 2022-08-31 ENCOUNTER — Telehealth: Payer: Self-pay | Admitting: Sports Medicine

## 2022-08-31 NOTE — Telephone Encounter (Signed)
Patient was notified via telephone yesterday of the x-ray results of her right hip.  It shows some mild arthritic changes but not severe.  She is in quite a bit of pain so I recommended consultation with Dr. Magnus Ivan for his input.  I will defer further workup and treatment to his discretion.  Patient will follow-up with me as needed.

## 2022-09-01 ENCOUNTER — Other Ambulatory Visit: Payer: Self-pay | Admitting: Internal Medicine

## 2022-09-01 ENCOUNTER — Inpatient Hospital Stay: Admission: RE | Admit: 2022-09-01 | Payer: PPO | Source: Ambulatory Visit

## 2022-09-01 DIAGNOSIS — N6310 Unspecified lump in the right breast, unspecified quadrant: Secondary | ICD-10-CM

## 2022-09-21 ENCOUNTER — Ambulatory Visit: Payer: PPO | Admitting: Orthopaedic Surgery

## 2022-10-17 ENCOUNTER — Ambulatory Visit: Payer: PPO | Admitting: Orthopaedic Surgery

## 2022-10-19 ENCOUNTER — Other Ambulatory Visit: Payer: Self-pay | Admitting: Sports Medicine

## 2022-10-19 ENCOUNTER — Other Ambulatory Visit: Payer: Self-pay

## 2022-10-19 MED ORDER — METHOCARBAMOL 500 MG PO TABS: 500.0000 mg | ORAL_TABLET | Freq: Three times a day (TID) | ORAL | 1 refills | Status: DC | PRN

## 2022-10-25 ENCOUNTER — Other Ambulatory Visit: Payer: PPO

## 2022-11-03 ENCOUNTER — Emergency Department (HOSPITAL_COMMUNITY): Payer: PPO

## 2022-11-03 ENCOUNTER — Inpatient Hospital Stay (HOSPITAL_COMMUNITY)
Admission: EM | Admit: 2022-11-03 | Discharge: 2022-11-10 | DRG: 309 | Disposition: E | Payer: PPO | Attending: Internal Medicine | Admitting: Internal Medicine

## 2022-11-03 ENCOUNTER — Ambulatory Visit: Payer: PPO | Admitting: Physician Assistant

## 2022-11-03 DIAGNOSIS — R0603 Acute respiratory distress: Secondary | ICD-10-CM | POA: Diagnosis present

## 2022-11-03 DIAGNOSIS — Z7984 Long term (current) use of oral hypoglycemic drugs: Secondary | ICD-10-CM

## 2022-11-03 DIAGNOSIS — R0902 Hypoxemia: Secondary | ICD-10-CM | POA: Diagnosis present

## 2022-11-03 DIAGNOSIS — K219 Gastro-esophageal reflux disease without esophagitis: Secondary | ICD-10-CM | POA: Diagnosis present

## 2022-11-03 DIAGNOSIS — E1165 Type 2 diabetes mellitus with hyperglycemia: Secondary | ICD-10-CM | POA: Diagnosis present

## 2022-11-03 DIAGNOSIS — I498 Other specified cardiac arrhythmias: Principal | ICD-10-CM | POA: Diagnosis present

## 2022-11-03 DIAGNOSIS — Z7982 Long term (current) use of aspirin: Secondary | ICD-10-CM | POA: Diagnosis not present

## 2022-11-03 DIAGNOSIS — E669 Obesity, unspecified: Secondary | ICD-10-CM | POA: Diagnosis present

## 2022-11-03 DIAGNOSIS — N179 Acute kidney failure, unspecified: Secondary | ICD-10-CM | POA: Diagnosis present

## 2022-11-03 DIAGNOSIS — E785 Hyperlipidemia, unspecified: Secondary | ICD-10-CM | POA: Diagnosis present

## 2022-11-03 DIAGNOSIS — R0689 Other abnormalities of breathing: Secondary | ICD-10-CM | POA: Diagnosis present

## 2022-11-03 DIAGNOSIS — Z794 Long term (current) use of insulin: Secondary | ICD-10-CM | POA: Diagnosis not present

## 2022-11-03 DIAGNOSIS — Z833 Family history of diabetes mellitus: Secondary | ICD-10-CM | POA: Diagnosis not present

## 2022-11-03 DIAGNOSIS — E8729 Other acidosis: Secondary | ICD-10-CM | POA: Diagnosis present

## 2022-11-03 DIAGNOSIS — I469 Cardiac arrest, cause unspecified: Principal | ICD-10-CM | POA: Diagnosis present

## 2022-11-03 DIAGNOSIS — I462 Cardiac arrest due to underlying cardiac condition: Secondary | ICD-10-CM | POA: Diagnosis present

## 2022-11-03 DIAGNOSIS — Z7985 Long-term (current) use of injectable non-insulin antidiabetic drugs: Secondary | ICD-10-CM | POA: Diagnosis not present

## 2022-11-03 DIAGNOSIS — Z79899 Other long term (current) drug therapy: Secondary | ICD-10-CM

## 2022-11-03 DIAGNOSIS — Z66 Do not resuscitate: Secondary | ICD-10-CM | POA: Diagnosis present

## 2022-11-03 DIAGNOSIS — M109 Gout, unspecified: Secondary | ICD-10-CM | POA: Diagnosis present

## 2022-11-03 DIAGNOSIS — Z86711 Personal history of pulmonary embolism: Secondary | ICD-10-CM

## 2022-11-03 DIAGNOSIS — I10 Essential (primary) hypertension: Secondary | ICD-10-CM | POA: Diagnosis present

## 2022-11-03 DIAGNOSIS — M5137 Other intervertebral disc degeneration, lumbosacral region: Secondary | ICD-10-CM | POA: Diagnosis present

## 2022-11-03 DIAGNOSIS — Z8249 Family history of ischemic heart disease and other diseases of the circulatory system: Secondary | ICD-10-CM

## 2022-11-03 DIAGNOSIS — Z96653 Presence of artificial knee joint, bilateral: Secondary | ICD-10-CM | POA: Diagnosis present

## 2022-11-03 DIAGNOSIS — R4182 Altered mental status, unspecified: Secondary | ICD-10-CM | POA: Diagnosis not present

## 2022-11-03 DIAGNOSIS — Z9049 Acquired absence of other specified parts of digestive tract: Secondary | ICD-10-CM | POA: Diagnosis not present

## 2022-11-03 LAB — I-STAT CHEM 8, ED
BUN: 44 mg/dL — ABNORMAL HIGH (ref 8–23)
Calcium, Ion: 1.06 mmol/L — ABNORMAL LOW (ref 1.15–1.40)
Chloride: 106 mmol/L (ref 98–111)
Creatinine, Ser: 2.4 mg/dL — ABNORMAL HIGH (ref 0.44–1.00)
Glucose, Bld: 410 mg/dL — ABNORMAL HIGH (ref 70–99)
HCT: 41 % (ref 36.0–46.0)
Hemoglobin: 13.9 g/dL (ref 12.0–15.0)
Potassium: 3.6 mmol/L (ref 3.5–5.1)
Sodium: 139 mmol/L (ref 135–145)
TCO2: 19 mmol/L — ABNORMAL LOW (ref 22–32)

## 2022-11-03 LAB — I-STAT VENOUS BLOOD GAS, ED
Acid-base deficit: 11 mmol/L — ABNORMAL HIGH (ref 0.0–2.0)
Acid-base deficit: 11 mmol/L — ABNORMAL HIGH (ref 0.0–2.0)
Bicarbonate: 18.3 mmol/L — ABNORMAL LOW (ref 20.0–28.0)
Bicarbonate: 20.4 mmol/L (ref 20.0–28.0)
Calcium, Ion: 1.06 mmol/L — ABNORMAL LOW (ref 1.15–1.40)
Calcium, Ion: 1.02 mmol/L — ABNORMAL LOW (ref 1.15–1.40)
HCT: 41 % (ref 36.0–46.0)
HCT: 37 % (ref 36.0–46.0)
Hemoglobin: 13.9 g/dL (ref 12.0–15.0)
Hemoglobin: 12.6 g/dL (ref 12.0–15.0)
O2 Saturation: 36 %
O2 Saturation: 21 %
Potassium: 3.7 mmol/L (ref 3.5–5.1)
Potassium: 3.4 mmol/L — ABNORMAL LOW (ref 3.5–5.1)
Sodium: 139 mmol/L (ref 135–145)
Sodium: 144 mmol/L (ref 135–145)
TCO2: 20 mmol/L — ABNORMAL LOW (ref 22–32)
TCO2: 23 mmol/L (ref 22–32)
pCO2, Ven: 50.7 mmHg (ref 44–60)
pCO2, Ven: 75.7 mmHg (ref 44–60)
pH, Ven: 7.164 — CL (ref 7.25–7.43)
pH, Ven: 7.038 — CL (ref 7.25–7.43)
pO2, Ven: 27 mmHg — CL (ref 32–45)
pO2, Ven: 23 mmHg — CL (ref 32–45)

## 2022-11-03 LAB — I-STAT ARTERIAL BLOOD GAS, ED
Acid-base deficit: 19 mmol/L — ABNORMAL HIGH (ref 0.0–2.0)
Bicarbonate: 13.9 mmol/L — ABNORMAL LOW (ref 20.0–28.0)
Calcium, Ion: 1.08 mmol/L — ABNORMAL LOW (ref 1.15–1.40)
HCT: 39 % (ref 36.0–46.0)
Hemoglobin: 13.3 g/dL (ref 12.0–15.0)
O2 Saturation: 78 %
Potassium: 3.5 mmol/L (ref 3.5–5.1)
Sodium: 141 mmol/L (ref 135–145)
TCO2: 16 mmol/L — ABNORMAL LOW (ref 22–32)
pCO2 arterial: 63.1 mmHg — ABNORMAL HIGH (ref 32–48)
pH, Arterial: 6.95 — CL (ref 7.35–7.45)
pO2, Arterial: 67 mmHg — ABNORMAL LOW (ref 83–108)

## 2022-11-03 LAB — COMPREHENSIVE METABOLIC PANEL
ALT: 36 U/L (ref 0–44)
AST: 98 U/L — ABNORMAL HIGH (ref 15–41)
Albumin: 2.4 g/dL — ABNORMAL LOW (ref 3.5–5.0)
Alkaline Phosphatase: 126 U/L (ref 38–126)
Anion gap: 18 — ABNORMAL HIGH (ref 5–15)
BUN: 48 mg/dL — ABNORMAL HIGH (ref 8–23)
CO2: 15 mmol/L — ABNORMAL LOW (ref 22–32)
Calcium: 8.6 mg/dL — ABNORMAL LOW (ref 8.9–10.3)
Chloride: 103 mmol/L (ref 98–111)
Creatinine, Ser: 2.43 mg/dL — ABNORMAL HIGH (ref 0.44–1.00)
GFR, Estimated: 22 mL/min — ABNORMAL LOW (ref >60)
Glucose, Bld: 437 mg/dL — ABNORMAL HIGH (ref 70–99)
Potassium: 3.8 mmol/L (ref 3.5–5.1)
Sodium: 136 mmol/L (ref 135–145)
Total Bilirubin: 0.5 mg/dL (ref 0.3–1.2)
Total Protein: 5.9 g/dL — ABNORMAL LOW (ref 6.5–8.1)

## 2022-11-03 LAB — CBC
HCT: 41.5 % (ref 36.0–46.0)
Hemoglobin: 12.6 g/dL (ref 12.0–15.0)
MCH: 28.6 pg (ref 26.0–34.0)
MCHC: 30.4 g/dL (ref 30.0–36.0)
MCV: 94.1 fL (ref 80.0–100.0)
Platelets: 210 10*3/uL (ref 150–400)
RBC: 4.41 MIL/uL (ref 3.87–5.11)
RDW: 15.3 % (ref 11.5–15.5)
WBC: 14.6 10*3/uL — ABNORMAL HIGH (ref 4.0–10.5)
nRBC: 0 % (ref 0.0–0.2)

## 2022-11-03 LAB — TROPONIN I (HIGH SENSITIVITY): Troponin I (High Sensitivity): 20 ng/L — ABNORMAL HIGH (ref <18)

## 2022-11-03 LAB — MAGNESIUM: Magnesium: 1.7 mg/dL (ref 1.7–2.4)

## 2022-11-03 LAB — D-DIMER, QUANTITATIVE: D-Dimer, Quant: >20 ug/mL-FEU — ABNORMAL HIGH (ref 0.00–0.50)

## 2022-11-03 MED ORDER — EPINEPHRINE 0.1 MG/10ML (10 MCG/ML) SYRINGE FOR IV PUSH (FOR BLOOD PRESSURE SUPPORT): 20.0000 ug | PREFILLED_SYRINGE | Freq: Once | INTRAVENOUS | Status: DC | PRN

## 2022-11-03 MED ORDER — PIPERACILLIN-TAZOBACTAM 3.375 G IVPB 30 MIN
3.3750 g | Freq: Once | INTRAVENOUS | Status: AC
  Administered 2022-11-03: 3.375 g via INTRAVENOUS
  Filled 2022-11-03: qty 50

## 2022-11-03 MED ORDER — SUCCINYLCHOLINE CHLORIDE 200 MG/10ML IV SOSY
PREFILLED_SYRINGE | INTRAVENOUS | Status: AC
  Administered 2022-11-03: 100 mg via INTRAVENOUS
  Filled 2022-11-03: qty 10

## 2022-11-03 MED ORDER — NALOXONE HCL 2 MG/2ML IJ SOSY
PREFILLED_SYRINGE | INTRAMUSCULAR | Status: AC
  Filled 2022-11-03: qty 2

## 2022-11-03 MED ORDER — EPINEPHRINE 1 MG/10ML IJ SOSY
PREFILLED_SYRINGE | INTRAMUSCULAR | Status: AC | PRN
  Administered 2022-11-03: 1 mg via INTRAVENOUS

## 2022-11-03 MED ORDER — EPINEPHRINE 0.1 MG/10ML (10 MCG/ML) SYRINGE FOR IV PUSH (FOR BLOOD PRESSURE SUPPORT): 40.0000 ug | PREFILLED_SYRINGE | Freq: Once | INTRAVENOUS | Status: DC | PRN

## 2022-11-03 MED ORDER — SODIUM BICARBONATE 8.4 % IV SOLN: 100.0000 meq | Freq: Once | INTRAVENOUS | Status: DC

## 2022-11-03 MED ORDER — ROCURONIUM BROMIDE 10 MG/ML (PF) SYRINGE
PREFILLED_SYRINGE | INTRAVENOUS | Status: AC
  Administered 2022-11-03: 20 mg via INTRAVENOUS
  Filled 2022-11-03: qty 10

## 2022-11-03 MED ORDER — ATROPINE SULFATE 1 MG/10ML IJ SOSY
PREFILLED_SYRINGE | INTRAMUSCULAR | Status: AC
  Filled 2022-11-03: qty 10

## 2022-11-03 MED ORDER — MIDAZOLAM HCL 2 MG/2ML IJ SOLN
INTRAMUSCULAR | Status: AC
  Filled 2022-11-03: qty 2

## 2022-11-03 MED ORDER — EPINEPHRINE 1 MG/10ML IJ SOSY
PREFILLED_SYRINGE | INTRAMUSCULAR | Status: AC | PRN
  Administered 2022-11-03 (×2): 1 mg via INTRAVENOUS

## 2022-11-03 MED ORDER — PIPERACILLIN-TAZOBACTAM 3.375 G IVPB: 3.3750 g | Freq: Three times a day (TID) | INTRAVENOUS | Status: DC

## 2022-11-03 MED ORDER — ETOMIDATE 2 MG/ML IV SOLN
INTRAVENOUS | Status: AC
  Filled 2022-11-03: qty 20

## 2022-11-03 MED ORDER — SODIUM BICARBONATE 8.4 % IV SOLN
INTRAVENOUS | Status: AC | PRN
  Administered 2022-11-03: 100 meq via INTRAVENOUS

## 2022-11-03 MED ORDER — VASOPRESSIN 20 UNITS/100 ML INFUSION FOR SHOCK
INTRAVENOUS | Status: AC
  Filled 2022-11-03: qty 100

## 2022-11-03 MED ORDER — VANCOMYCIN VARIABLE DOSE PER UNSTABLE RENAL FUNCTION (PHARMACIST DOSING): Status: DC

## 2022-11-03 MED ORDER — EPINEPHRINE 1 MG/10ML IJ SOSY
PREFILLED_SYRINGE | INTRAMUSCULAR | Status: AC | PRN
  Administered 2022-11-03 (×2): 0.2 mg via INTRAVENOUS
  Administered 2022-11-03: 1 mg via INTRAVENOUS

## 2022-11-03 MED ORDER — EPINEPHRINE HCL 5 MG/250ML IV SOLN IN NS
0.5000 ug/min | INTRAVENOUS | Status: DC
  Administered 2022-11-03 (×2): 10 ug/min via INTRAVENOUS
  Filled 2022-11-03: qty 250

## 2022-11-03 MED ORDER — VANCOMYCIN HCL 10 G IV SOLR
2500.0000 mg | Freq: Once | INTRAVENOUS | Status: DC
  Filled 2022-11-03: qty 25

## 2022-11-03 MED ORDER — NOREPINEPHRINE 4 MG/250ML-% IV SOLN
0.0000 ug/min | INTRAVENOUS | Status: DC
  Administered 2022-11-03: 20 ug/min via INTRAVENOUS
  Administered 2022-11-03: 10 ug/min via INTRAVENOUS
  Filled 2022-11-03 (×2): qty 250

## 2022-11-03 MED ORDER — ATROPINE SULFATE 1 MG/10ML IJ SOSY
PREFILLED_SYRINGE | INTRAMUSCULAR | Status: AC | PRN
  Administered 2022-11-03: 1 mg via INTRAVENOUS

## 2022-11-04 NOTE — ED Notes (Signed)
Bed management notified patient sent to morgue

## 2022-11-04 NOTE — ED Notes (Signed)
Security to send patient to morgue

## 2022-11-04 NOTE — ED Notes (Signed)
Spoke to [facility] examiner who states after conversation with Dr Levon Hedger patient is not an ME case and can be sent to funeral home.

## 2022-11-04 NOTE — ED Notes (Signed)
[employer] notified patient ready for pick up from [facility]

## 2022-11-07 ENCOUNTER — Ambulatory Visit: Payer: PPO | Admitting: Orthopaedic Surgery

## 2022-11-07 ENCOUNTER — Ambulatory Visit: Payer: PPO | Admitting: Physician Assistant

## 2022-11-10 NOTE — Progress Notes (Signed)
Interim  Updated family at bedside: they understand high chance of mortality and even if survives high chance of profound brain damage ABG noted Remains marginal, not sure cuff pressures are accurate Would not escalate pressors nor push bicarb as we are prolonging the inevitable Will add some abx Too unstable for CT scan Family praying with patient at bedside  Myrla Halsted MD PCCM

## 2022-11-10 NOTE — ED Provider Notes (Addendum)
[place] EMERGENCY DEPARTMENT AT [facility] Provider Note  CSN: [id number] Arrival date & time: 10/23/2022 1731  Chief Complaint(s) No chief complaint on file.  HPI Maria [name] is a 62 y.o. female with history of pulmonary embolism, hypertension, hyperlipidemia, obesity, diabetes not on blood thinners presenting to the emergency department with altered mental status.  Per paramedics, the patient was with her significant other and then suddenly seem to become unresponsive.  Discussed with patient's daughter, she reports that her mother has been feeling not well for the past few weeks.  She had been dealing with a boil on her thigh which had apparently burst today.  Otherwise, daughter does not think that she has had any recent specific issues like difficulty breathing, fevers, or other process.  Reviewing chart, patient had called [facility] about shortness of breath recently.   Past Medical History Past Medical History:  Diagnosis Date   Allergic rhinitis    Anxiety    DDD (degenerative disc disease), lumbosacral    Depression    DUB (dysfunctional uterine bleeding)    External hemorrhoid    GERD (gastroesophageal reflux disease)    Gout    11-18-2021   per pt last flare up > 1 yr ago,  effects right foot   Hiatal hernia    recurrent //   s/p posterior hiatal hernia repair w/ gastric sleeve 07-26-2016   History of cellulitis 05/2014   and abscess of abd wall  s/p drainage/ debridement   History of kidney stones    History of pulmonary embolus (PE) 03/19/2018   (11-18-2021  pt stated is followed by pulmonologist w/ [facility] in [facility] on [address],  stated has not had previous clots prior to 2019 or none after , only take asa 81 mg daily) ///   bilateral submassive pulmonary arteries  s/p mechanical thrombectomy (hospital admision in epic)   Hyperlipidemia    Hypertension    Hypertrophied anal papilla    Morbid obesity (HCC)    Multiple thyroid nodules    per pt  followed by pcp and has had previous bx that was benighn   OA (osteoarthritis)    Rotator cuff syndrome of left shoulder    w/ chronic pain   Type 2 diabetes mellitus treated with insulin (HCC)    followed by pcp   (11-18-2021  pt stated she does not check blood sugar at home)   Wears contact lenses    Patient Active Problem List   Diagnosis Date Noted   Cardiac arrest (HCC) 10/13/2022   Panniculitis 07/26/2021   Back pain 07/26/2021   Hypertension    Type II diabetes mellitus (HCC)    Obesity    Closed fracture of fourth toe of left foot 10/17/2019   Achilles tendinitis of right lower extremity 12/23/2017   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   Cervical disc disorder with radiculopathy of cervical region 02/05/2016   Osteoarthritis of left hip 08/22/2014   Radicular pain in left arm 09/27/2013   Chronic pain in left shoulder 03/28/2013   Rotator cuff syndrome 06/29/2011   S/P TKR (total knee replacement) 01/17/2011   GOUT, UNSPECIFIED 10/09/2009   HEMORRHOIDS, EXTERNAL 06/17/2009   Depression 04/23/2009   NEPHROLITHIASIS, RECURRENT 04/23/2009   Carpal tunnel syndrome on left 01/26/2009   Disorder of bursae and tendons in shoulder region 09/19/2008   GERD 02/25/2008   Diabetes type 2, uncontrolled 04/26/2006   Hyperlipidemia 04/26/2006   ANXIETY STATE NOS 04/26/2006  Obstructive sleep apnea 04/26/2006   MORBID OBESITY 01/11/2006   Essential hypertension 01/11/2006   ALLERGIC RHINITIS 01/11/2006   DYSFUNCTIONAL UTERINE BLEEDING 01/11/2006   Home Medication(s) Prior to Admission medications   Medication Sig Start Date End Date Taking? Authorizing Provider  aspirin EC 81 MG tablet Take 81 mg by mouth at bedtime. Swallow whole.    [provider]  benazepril (LOTENSIN) 40 MG tablet Take 40 mg by mouth every evening. 07/06/16   [provider]  Colchicine 0.6 MG CAPS TAKE 1 CAPSULE BY MOUTH 1-2 TIMES A DAY FOR 3 DAYS FOR GOUT FLARE AND  1 CAP DAILY FOR PROPHYLAXIS 07/25/22   Nestor Ramp, MD  diazepam (VALIUM) 5 MG tablet Take one tablet one hour prior to MRI 03/09/22   Ralene Cork, DO  furosemide (LASIX) 20 MG tablet Take 20 mg by mouth every evening. 12/28/15   [provider]  gabapentin (NEURONTIN) 300 MG capsule Take 1 capsules (300 mg) three times daily. 08/16/22   Draper, Ozzie Hoyle, DO  glipiZIDE (GLUCOTROL) 5 MG tablet Take 5 mg by mouth every evening. 07/13/17   [provider]  LANTUS SOLOSTAR 100 UNIT/ML Solostar Pen Inject 30 Units into the skin at bedtime. 08/31/21   [provider]  loperamide (IMODIUM A-D) 2 MG tablet Take 4 mg by mouth 4 (four) times daily as needed for diarrhea or loose stools. Patient not taking: Reported on 09/08/2021    [provider]  metFORMIN (GLUCOPHAGE) 850 MG tablet Take 850 mg by mouth 2 (two) times daily. 07/13/17   [provider]  methocarbamol (ROBAXIN) 500 MG tablet Take 1 tablet (500 mg total) by mouth 3 (three) times daily as needed for muscle spasms. 10/19/22   Ralene Cork, DO  metoprolol (TOPROL-XL) 200 MG 24 hr tablet Take 200 mg by mouth at bedtime.    [provider]  naloxone [facility]) 4 MG/0.1ML LIQD nasal spray kit Place 1 spray into the nose as needed (opioid overdose).    [provider]  omeprazole (PRILOSEC) 40 MG capsule Take 40 mg by mouth at bedtime.    [provider]  Oxycodone HCl 10 MG TABS Take 10 mg by mouth every 4 (four) hours as needed. 05/09/21   [provider]  Semaglutide, 1 MG/DOSE, (OZEMPIC, 1 MG/DOSE,) 2 MG/1.5ML SOPN Inject 1 mg into the skin every Monday. Patient not taking: Reported on 11/18/2021    [provider]  Vitamin D, Ergocalciferol, (DRISDOL) 1.25 MG (50000 UT) CAPS capsule Take 50,000 Units by mouth every Monday.    [provider]                                                                                                                                     Past Surgical History Past Surgical History:  Procedure Laterality Date   ANAL FISTULOTOMY N/A 11/26/2021   Procedure: EXCISION OF  ANAL PAPILLA;  Surgeon: Romie Levee, MD;  Location: [facility];  Service: General;  Laterality: N/A;   CHOLECYSTECTOMY OPEN  1988   CYSTOSCOPY/URETEROSCOPY/HOLMIUM LASER/STENT PLACEMENT Bilateral    right 06-06-2007  and left 11-07-2007  both done @[facility]    INCISION AND DRAINAGE ABSCESS N/A 06/01/2014   Procedure: INCISION AND DRAINAGE ABSCESS;  Surgeon: Emelia Loron, MD;  Location: [facility];  Service: General;  Laterality: N/A;   IR GENERIC HISTORICAL  05/18/2016   IR EPIDUROGRAPHY 05/18/2016 Marin Roberts, MD [facility]INTER[facility] RAD   KNEE ARTHROSCOPY Left    12-01-1999 @[facility]  by dr Luiz Blare  and 07-05-2005  @[facility]   by dr handy   LAPAROSCOPIC GASTRIC SLEEVE RESECTION WITH HIATAL HERNIA REPAIR N/A 07/26/2016   Procedure: LAPAROSCOPIC GASTRIC SLEEVE RESECTION WITH HIATAL HERNIA REPAIR, UPPER ENDOSCOPY;  Surgeon: Luretha Murphy, MD;  Location: [facility];  Service: General;  Laterality: N/A;   MASS EXCISION Left 11/27/2020   Procedure: EXCISION OF MASS IN LEFT BUTTOCK;  Surgeon: Luretha Murphy, MD;  Location: [facility];  Service: General;  Laterality: Left;   PULMONARY THROMBECTOMY N/A 03/19/2018   Procedure: PULMONARY THROMBECTOMY;  Surgeon: Annice Needy, MD;  Location: [facility] INVASIVE CV [facility];  Service: Cardiovascular;  Laterality: N/A;   RADIOACTIVE SEED GUIDED EXCISIONAL BREAST BIOPSY Left 01/11/2019   Procedure: RADIOACTIVE SEED GUIDED OF 2 SITES EXCISIONAL LEFT BREAST BIOPSY;  Surgeon: Luretha Murphy, MD;  Location: [facility] OR;  Service: General;  Laterality: Left;   TOTAL KNEE ARTHROPLASTY Bilateral 2012   TUBAL LIGATION Bilateral    yrs ago   Family History Family History  Problem Relation Age of Onset   Coronary artery disease Other    Hypertension Other    Diabetes Other    Obesity Other    Diabetes Mother     Hypertension Mother    Congestive Heart Failure Mother     Social History Social History   Tobacco Use   Smoking status: Never   Smokeless tobacco: Never  Vaping Use   Vaping status: Never Used  Substance Use Topics   Alcohol use: Yes    Comment: occasional   Drug use: Never   Allergies Propoxyphene n-acetaminophen  Review of Systems Review of Systems  All other systems reviewed and are negative.   Physical Exam Vital Signs  I have reviewed the triage vital signs BP 104/67   Pulse (!) 114   Resp (!) 26   LMP 04/11/2009   SpO2 (!) 80%  Physical Exam Vitals and nursing note reviewed.  Constitutional:      General: She is in acute distress.     Appearance: She is well-developed. She is obese. She is ill-appearing and diaphoretic.     Comments: Obtunded  HENT:     Head: Normocephalic and atraumatic.     Right Ear: External ear normal.     Left Ear: External ear normal.     Mouth/Throat:     Mouth: Mucous membranes are dry.  Eyes:     Pupils: Pupils are equal, round, and reactive to light.  Cardiovascular:     Rate and Rhythm: Normal rate and regular rhythm.  Pulmonary:     Comments: Tachypnea, increased work of breathing Abdominal:     General: Abdomen is flat.     Palpations: Abdomen is soft.     Tenderness: There is no abdominal tenderness.  Musculoskeletal:        General: No tenderness.     Cervical back: Neck supple.  Right lower leg: No edema.     Left lower leg: No edema.     Comments: No obvious wounds on the legs or the thighs but limited as unable to roll the patient given her critical illness  Skin:    Coloration: Skin is not pale.     Comments: Cool to touch  Neurological:     Comments: Did move all 4 extremities but only slightly.  Did not follow commands.  No response to sternal rub or painful stimuli.  Psychiatric:     Comments: Unable to assess.     ED Results and Treatments Labs (all labs ordered are listed, but only abnormal  results are displayed) Labs Reviewed  COMPREHENSIVE METABOLIC PANEL - Abnormal; Notable for the following components:      Result Value   CO2 15 (*)    Glucose, Bld 437 (*)    BUN 48 (*)    Creatinine, Ser 2.43 (*)    Calcium 8.6 (*)    Total Protein 5.9 (*)    Albumin 2.4 (*)    AST 98 (*)    GFR, Estimated 22 (*)    Anion gap 18 (*)    All other components within normal limits  CBC - Abnormal; Notable for the following components:   WBC 14.6 (*)    All other components within normal limits  D-DIMER, QUANTITATIVE - Abnormal; Notable for the following components:   D-Dimer, Quant >20.00 (*)    All other components within normal limits  I-STAT CHEM 8, ED - Abnormal; Notable for the following components:   BUN 44 (*)    Creatinine, Ser 2.40 (*)    Glucose, Bld 410 (*)    Calcium, Ion 1.06 (*)    TCO2 19 (*)    All other components within normal limits  I-STAT VENOUS BLOOD GAS, ED - Abnormal; Notable for the following components:   pH, Ven 7.164 (*)    pO2, Ven 27 (*)    Bicarbonate 18.3 (*)    TCO2 20 (*)    Acid-base deficit 11.0 (*)    Calcium, Ion 1.06 (*)    All other components within normal limits  I-STAT VENOUS BLOOD GAS, ED - Abnormal; Notable for the following components:   pH, Ven 7.038 (*)    pCO2, Ven 75.7 (*)    pO2, Ven 23 (*)    Acid-base deficit 11.0 (*)    Potassium 3.4 (*)    Calcium, Ion 1.02 (*)    All other components within normal limits  TROPONIN I (HIGH SENSITIVITY) - Abnormal; Notable for the following components:   Troponin I (High Sensitivity) 20 (*)    All other components within normal limits  MAGNESIUM  BRAIN NATRIURETIC PEPTIDE  CBG MONITORING, ED  TROPONIN I (HIGH SENSITIVITY)  Radiology DG Chest Portable 1 View  Result Date: 10/26/2022 CLINICAL DATA:  Cardiac arrest.  Post intubation. EXAM: PORTABLE CHEST 1 VIEW  COMPARISON:  X-ray 12/10/2018 FINDINGS: ET tube in place with the tip proximally 4 cm above the carina. Overlapping cardiac leads. Enlarged cardiopericardial silhouette. No consolidation, pneumothorax, effusion or edema. Overlapping defibrillator pads. IMPRESSION: ET tube in place. Enlarged heart. Electronically Signed   By: Karen Kays M.D.   On: 11/02/2022 18:31    Pertinent labs & imaging results that were available during my care of the patient were reviewed by me and considered in my medical decision making (see MDM for details).  Medications Ordered in ED Medications  etomidate (AMIDATE) 2 MG/ML injection (has no administration in time range)  naloxone (NARCAN) 2 MG/2ML injection (has no administration in time range)  naloxone [facility]) 2 MG/2ML injection (has no administration in time range)  atropine 1 MG/10ML injection (has no administration in time range)  EPINEPhrine 10 mcg/mL Adult IV Push Syringe (For Blood Pressure Support) (has no administration in time range)  norepinephrine (LEVOPHED) 4mg  in (0.016 mg/mL) premix infusion (60 mcg/min Intravenous Rate/Dose Change 10/19/2022 1815)  EPINEPHrine (ADRENALIN) 5 mg in NS 250 mL (0.02 mg/mL) premix infusion (40 mcg/min Intravenous Rate/Dose Change 10/18/2022 1855)  EPINEPhrine 10 mcg/mL Adult IV Push Syringe (For Blood Pressure Support) (has no administration in time range)  vasopressin 20 units/100 mL infusion SOLN (has no administration in time range)  sodium bicarbonate injection 100 mEq (has no administration in time range)  rocuronium bromide 100 MG/10ML SOSY (20 mg Intravenous Given 10/25/2022 1752)  succinylcholine (ANECTINE) 200 MG/10ML syringe (100 mg Intravenous Given 11/02/2022 1752)  EPINEPHrine (ADRENALIN) 1 MG/10ML injection (1 mg Intravenous Given 10/13/2022 1758)  sodium bicarbonate injection (100 mEq Intravenous Given 10/16/2022 1759)  EPINEPHrine (ADRENALIN) 1 MG/10ML injection (1 mg Intravenous Given 10/25/2022 1810)  atropine 1  MG/10ML injection (1 mg Intravenous Given 11/02/2022 1800)  EPINEPHrine (ADRENALIN) 1 MG/10ML injection (1 mg Intravenous Given 10/23/2022 1804)  EPINEPHrine (ADRENALIN) 1 MG/10ML injection (1 mg Intravenous Given 11/08/2022 1821)  sodium bicarbonate injection (100 mEq Intravenous Given 10/29/2022 1822)  EPINEPHrine (ADRENALIN) 1 MG/10ML injection (1 mg Intravenous Given 10/30/2022 1832)  EPINEPHrine (ADRENALIN) 1 MG/10ML injection (1 mg Intravenous Given 10/12/2022 1900)                                                                                                                                     Procedures .Critical Care  Performed by: Lonell Grandchild, MD Authorized by: Lonell Grandchild, MD   Critical care provider statement:    Critical care time (minutes):  80   Critical care time was exclusive of:  Separately billable procedures and treating other patients   Critical care was necessary to treat or prevent imminent or life-threatening deterioration of the following conditions:  Circulatory failure   Critical care was time spent personally by me on  the following activities:  Development of treatment plan with patient or surrogate, discussions with consultants, evaluation of patient's response to treatment, examination of patient, ordering and review of laboratory studies, ordering and review of radiographic studies, ordering and performing treatments and interventions, pulse oximetry, re-evaluation of patient's condition and review of old charts   Care discussed with: admitting provider   Procedure Name: Intubation Date/Time: 10/21/2022 8:02 PM  Performed by: Lonell Grandchild, MDPre-anesthesia Checklist: Emergency Drugs available, Timeout performed, Patient identified, Patient being monitored and Suction available Oxygen Delivery Method: Ambu bag Induction Type: Rapid sequence Laryngoscope Size: Glidescope Grade View: Grade I Tube size: 7.5 mm Number of attempts: 1 Airway Equipment and  Method: Video-laryngoscopy Placement Confirmation: Breath sounds checked- equal and bilateral, ETT inserted through vocal cords under direct vision, Positive ETCO2 and CO2 detector Tube secured with: ETT holder Dental Injury: Teeth and Oropharynx as per pre-operative assessment     CPR  Date/Time: 10/25/2022 8:03 PM  Performed by: Lonell Grandchild, MD Authorized by: Lonell Grandchild, MD  CPR Procedure Details:    ACLS/BLS initiated by EMS: No     CPR/ACLS performed in the ED: Yes     Outcome: ROSC obtained    CPR performed via ACLS guidelines under my direct supervision.  See RN documentation for details including defibrillator use, medications, doses and timing. Marthenia Rolling Line  Date/Time: 10/12/2022 8:27 PM  Performed by: Lonell Grandchild, MD Authorized by: Lonell Grandchild, MD   Consent:    Consent obtained:  Emergent situation Universal protocol:    Patient identity confirmed:  Hospital-assigned identification number and arm band Pre-procedure details:    Indication(s): central venous access and insufficient peripheral access     Hand hygiene: Hand hygiene performed prior to insertion     Sterile barrier technique: All elements of maximal sterile technique followed     Skin preparation:  Chlorhexidine   Skin preparation agent: Skin preparation agent completely dried prior to procedure   Anesthesia:    Anesthesia method:  None Procedure details:    Location:  R femoral   Patient position:  Supine   Procedural supplies:  Triple lumen   Landmarks identified: yes     Ultrasound guidance: yes     Ultrasound guidance timing: real time     Sterile ultrasound techniques: Sterile gel and sterile probe covers were used     Number of attempts:  1   Successful placement: yes   Post-procedure details:    Post-procedure:  Dressing applied and line sutured   Assessment:  Blood return through all ports and free fluid flow   Procedure completion:  Tolerated well, no  immediate complications   (including critical care time)  Medical Decision Making / ED Course   MDM:  62 year old female presenting with unresponsiveness.  Patient obtunded on arrival.  Patient not protecting airway and respiratory distress, with oxygen saturations in the low 90s on 15 L nonrebreather. No response to narcan. Patient obtunded and not good candidate for BiPAP.  Initial VBG showed respiratory acidosis.  Patient was intubated successfully.  Following intubation patient had bradycardia arrhythmia leading to cardiac arrest and CPR was performed.  Patient had multiple rounds of CPR and regained pulses but with subsequently go pulseless despite very high doses of vasopressors as well as repeated doses of push dose pressors.  Laboratory testing showed hyperglycemia and acute kidney injury so patient also received 3 L of fluid IV.  Repeat VBG showed hypercapnia so vent settings were adjusted  as well.  Given critical illness, discussed with ICU who will admit the patient although they suspect this is not a survivable illness.  I discussed this extensively with the patient's daughter including the poor prognosis and high chance that patient would ultimately expire.  She agreed that she would not want further suffering for her mother and patient is DNR but will be kept on life support for now.  Unclear cause of patient's illness and cardiac arrest.  Would consider further workup including CT scans including CT scan of the head however given very poor prognosis ICU feels this is not necessary unless patient were to have very unexpected recovery.  Ultimately differential includes intracranial bleeding, hypercapnia leading to altered mental status, acute pulmonary embolism, massive stroke, occult infectious process. tPA was considered but given undifferentiated cardiac arrest was not given. If patient remains stable in ICU and does not pass would need further workup.      Additional history  obtained: -Additional history obtained from family and ems -External records from outside source obtained and reviewed including: Chart review including previous notes, labs, imaging, consultation notes including prior notes including [facility] phone call   Lab Tests: -I ordered, reviewed, and interpreted labs.   The pertinent results include:   Labs Reviewed  COMPREHENSIVE METABOLIC PANEL - Abnormal; Notable for the following components:      Result Value   CO2 15 (*)    Glucose, Bld 437 (*)    BUN 48 (*)    Creatinine, Ser 2.43 (*)    Calcium 8.6 (*)    Total Protein 5.9 (*)    Albumin 2.4 (*)    AST 98 (*)    GFR, Estimated 22 (*)    Anion gap 18 (*)    All other components within normal limits  CBC - Abnormal; Notable for the following components:   WBC 14.6 (*)    All other components within normal limits  D-DIMER, QUANTITATIVE - Abnormal; Notable for the following components:   D-Dimer, Quant >20.00 (*)    All other components within normal limits  I-STAT CHEM 8, ED - Abnormal; Notable for the following components:   BUN 44 (*)    Creatinine, Ser 2.40 (*)    Glucose, Bld 410 (*)    Calcium, Ion 1.06 (*)    TCO2 19 (*)    All other components within normal limits  I-STAT VENOUS BLOOD GAS, ED - Abnormal; Notable for the following components:   pH, Ven 7.164 (*)    pO2, Ven 27 (*)    Bicarbonate 18.3 (*)    TCO2 20 (*)    Acid-base deficit 11.0 (*)    Calcium, Ion 1.06 (*)    All other components within normal limits  I-STAT VENOUS BLOOD GAS, ED - Abnormal; Notable for the following components:   pH, Ven 7.038 (*)    pCO2, Ven 75.7 (*)    pO2, Ven 23 (*)    Acid-base deficit 11.0 (*)    Potassium 3.4 (*)    Calcium, Ion 1.02 (*)    All other components within normal limits  TROPONIN I (HIGH SENSITIVITY) - Abnormal; Notable for the following components:   Troponin I (High Sensitivity) 20 (*)    All other components within normal limits  MAGNESIUM  BRAIN NATRIURETIC  PEPTIDE  CBG MONITORING, ED  TROPONIN I (HIGH SENSITIVITY)    Notable for only slightly elevated troponin  Imaging Studies ordered: I ordered imaging studies including CXR On my interpretation  imaging demonstrates appropriate ETT positioning, no PTX I independently visualized and interpreted imaging. I agree with the radiologist interpretation   Medicines ordered and prescription drug management: Meds ordered this encounter  Medications   rocuronium bromide 100 MG/10ML SOSY    Kohut, Nicole M: cabinet override   succinylcholine (ANECTINE) 200 MG/10ML syringe    Charlton Amor M: cabinet override   DISCONTD: midazolam (VERSED) 2 MG/2ML injection    Charlton Amor M: cabinet override   etomidate (AMIDATE) 2 MG/ML injection    Charlton Amor M: cabinet override   naloxone [facility]) 2 MG/2ML injection    Jerene Canny E: cabinet override   naloxone [facility]) 2 MG/2ML injection    Jerene Canny E: cabinet override   atropine 1 MG/10ML injection    Ahmed Prima M: cabinet override   EPINEPHrine (ADRENALIN) 1 MG/10ML injection   sodium bicarbonate injection   EPINEPHrine (ADRENALIN) 1 MG/10ML injection   atropine 1 MG/10ML injection   EPINEPHrine (ADRENALIN) 1 MG/10ML injection   EPINEPHrine (ADRENALIN) 1 MG/10ML injection   sodium bicarbonate injection   EPINEPhrine 10 mcg/mL Adult IV Push Syringe (For Blood Pressure Support)   norepinephrine (LEVOPHED) 4mg  in (0.016 mg/mL) premix infusion   EPINEPHrine (ADRENALIN) 5 mg in NS 250 mL (0.02 mg/mL) premix infusion   EPINEPHrine (ADRENALIN) 1 MG/10ML injection   EPINEPhrine 10 mcg/mL Adult IV Push Syringe (For Blood Pressure Support)   EPINEPHrine (ADRENALIN) 1 MG/10ML injection   vasopressin 20 units/100 mL infusion SOLN    Jerene Canny E: cabinet override   sodium bicarbonate injection 100 mEq    -I have reviewed the patients home medicines and have made adjustments as needed   Consultations Obtained: I  requested consultation with the ICU,  and discussed lab and imaging findings as well as pertinent plan - they recommend: admit to ICU   Cardiac Monitoring: The patient was maintained on a cardiac monitor.  I personally viewed and interpreted the cardiac monitored which showed an underlying rhythm of: Afib   Social Determinants of Health:  Diagnosis or treatment significantly limited by social determinants of health: obesity   Reevaluation: After the interventions noted above, I reevaluated the patient and found that their symptoms have worsened  Co morbidities that complicate the patient evaluation  Past Medical History:  Diagnosis Date   Allergic rhinitis    Anxiety    DDD (degenerative disc disease), lumbosacral    Depression    DUB (dysfunctional uterine bleeding)    External hemorrhoid    GERD (gastroesophageal reflux disease)    Gout    11-18-2021   per pt last flare up > 1 yr ago,  effects right foot   Hiatal hernia    recurrent //   s/p posterior hiatal hernia repair w/ gastric sleeve 07-26-2016   History of cellulitis 05/2014   and abscess of abd wall  s/p drainage/ debridement   History of kidney stones    History of pulmonary embolus (PE) 03/19/2018   (11-18-2021  pt stated is followed by pulmonologist w/ [facility] in [facility] [address],  stated has not had previous clots prior to 2019 or none after , only take asa 81 mg daily) ///   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    Final Clinical Impression(s) / ED Diagnoses Final diagnoses:  Cardiac arrest [facility])     This chart was dictated using voice recognition software.  Despite best efforts to proofread,  errors can occur which can change the documentation meaning.    Lonell Grandchild, MD 11/08/2022 2015    Lonell Grandchild, MD 10/11/2022 2031

## 2022-11-10 NOTE — ED Notes (Signed)
Patient second cousin called requesting updated advised to call family

## 2022-11-10 NOTE — Progress Notes (Addendum)
10/14/2022 Patient passed during transport to ICU, brought back to ER, have called daughter who is en route.  No answer on son's phone.  Myrla Halsted MD PCCM

## 2022-11-10 NOTE — ED Notes (Signed)
Pulmonary at bedside.  Will cancel CT, no need to give Bicarb.

## 2022-11-10 NOTE — Progress Notes (Signed)
Pt coded after intubation. CPR started.

## 2022-11-10 NOTE — Progress Notes (Signed)
Pharmacy Antibiotic Note  Maria [name] is a 62 y.o. female for which pharmacy has been consulted for vancomycin and zosyn dosing for pneumonia and bacteremia.  Patient with a history of T2DM, HTN, HLD, depression/anxiety. Patient presenting with AMS. Patient intubated shortly after arrival to ED and subsequently endured multiple cardiac arrests with periods of ROSC in between lasting ~10 minutes.  Requiring high dose vasopressor support.  SCr 2.4 - suspect will worsen WBC 14.6  Plan: Zosyn 3.375g IV q8h (4 hour infusion) Vancomycin 2500 mg once, subsequent dosing as indicated per random vancomycin level until renal function stable and/or improved, at which time scheduled dosing can be considered Monitor WBC, fever, renal function, cultures De-escalate when able     No data recorded.  Recent Labs  Lab 11/02/2022 1742 10/11/2022 1743  WBC 14.6*  --   CREATININE 2.43* 2.40*    CrCl cannot be calculated (Unknown ideal weight.).    Allergies  Allergen Reactions   Propoxyphene N-Acetaminophen Palpitations    darvocet    Microbiology results: Pending  Thank you for allowing pharmacy to be a part of this patient's care.  Delmar Landau, PharmD, BCPS 10/22/2022 9:43 PM ED Clinical Pharmacist -  [phone]

## 2022-11-10 NOTE — Code Documentation (Signed)
Vasopressin started at 0.03 unit/min.

## 2022-11-10 NOTE — ED Triage Notes (Signed)
TRIAGE NOTE:  Patient arrives by EMS from home for AMS.  Per report patient went to restroom and when husband checked on her after 10 minutes pt was unresponsive.   EMS VITALS 99/52 90-100BPM RR 24-30 11% end tidal  85-87 % on NRB  CBG 299

## 2022-11-10 NOTE — Code Documentation (Signed)
MD place central line.

## 2022-11-10 NOTE — ED Notes (Signed)
MD at bedside to intubate.    5:52 PM 20 mg Etomidate given IV.    Marland Kitchen5:52 PM 100 mg Rocuronium given IV.    5:54 PM 7.5 ET Tube placed 23 at the lip, +color change.

## 2022-11-10 NOTE — ED Notes (Signed)
As patient was being relocated from ER to ICU she lost all vitals and was placed back in ER room where physcians confirmed DNR status no CPR to be provided . TOD declared at 2250 as patient had no pulse , hr , rr or bp. Attending MD Dr Myrla Halsted at bedside to confirm death. Family notified allowed back at bedside. All monitoring devices removed and all medications stopped.

## 2022-11-10 NOTE — ED Notes (Signed)
Patient cousin called for updated advised to call family

## 2022-11-10 NOTE — ED Notes (Addendum)
5:42 PM Patient given 2 mg IV narcan.   5:46 PM Patient given another 2 mg dose IV narcan. Marland Kitchen

## 2022-11-10 NOTE — ED Notes (Signed)
Assumed care of patient who is intubated on epi, nor epi, and vasopressin pending admit to ICU. Per previous RN patient is DNR and family is choosing to withdraw care at this time. Patient unresponsive, intubated ,hypoxic ,pupils fixed and dialated. No sedation medications running at present time as patient has no purposeful movement and was coded multiple times prior to my arrival.

## 2022-11-10 NOTE — Consult Note (Signed)
10/16/2022  Brief CCM Note   I have seen and evaluated the patient for cardiac arrest.   S:  Hx drug abuse p/w AMS. No response to narcan so intubated for airway protection Cardiac arrest post intubation, prolonged now with inability to ventilate or oxygenate after arrest. Now DNR Family unfortunately just stepped out   O: Blood pressure 96/66, pulse 64, temperature 97.6 F (36.4 C), temperature source Oral, resp. rate 17, height 5\' 3"  (1.6 m), weight 81.6 kg, SpO2 94 %.   Ill appearing GCS3 Bilateral breath sounds Limited Echo showing no pericardial effusion Ext cool to tough Pupils blown  Profound mixed acidemia noted   A:  Prolonged cardiac arrest now with refractory hypoxemia, acidemia, ectopy and impending recurrence of cardiac arrest   P:  With degree of hypoxemia (sats in 50s with good pleth) and length of downtime (est > 1 hour CPR), this is non-survivable.  Family has requested chaplain.  Recommend allowing natural passing either in ER or in ICU depending on bed needs.  Available to talk to family when they return to bedside  DC CT scan, DC additional interventions as this will not change clinical outcome.   31 min cc time   Myrla Halsted MD [facility] Prefer epic messenger for cross cover needs If after hours, please call E-link

## 2022-11-10 NOTE — Code Documentation (Addendum)
Levophed infusing at 60 mcg/min. Epinephrine infusing at 10 mcg/min.   6:30 PM Epinephrine changed to infuse at 20 mcg/min.

## 2022-11-10 NOTE — Progress Notes (Signed)
   10/17/2022 1920  Vent Select  Invasive or Noninvasive Invasive  Adult Vent Y  Adult Ventilator Settings  Vt Set 510 mL  PEEP 8 cmH20   Per md order increased above settings.

## 2022-11-10 NOTE — ED Notes (Signed)
MD spoke with daughter.   Patient DNR no more compressions, continue medications.

## 2022-11-10 NOTE — Death Summary Note (Incomplete)
  DEATH SUMMARY   Patient Details  Name: Maria [name] MRN: [id number] DOB: 1960-08-01  Admission/Discharge Information   Admit Date:  2022-11-27  Date of Death: Date of Death: November 27, 2022  Time of Death: Time of Death: 12-06-48  Length of Stay: 1  Referring Physician: Quitman Livings, MD   Reason(s) for Hospitalization  {Trauma:[id number]::"***"}  Diagnoses  Preliminary cause of death:  Secondary Diagnoses (including complications and co-morbidities):  Principal Problem:   Cardiac arrest [facility])   Brief Hospital Course (including significant findings, care, treatment, and services provided and events leading to death)  Maria [name] is a 62 y.o. year old female who ***    Pertinent Labs and Studies  Significant Diagnostic Studies DG Chest Portable 1 View  Result Date: 27-Nov-2022 CLINICAL DATA:  Cardiac arrest.  Post intubation. EXAM: PORTABLE CHEST 1 VIEW COMPARISON:  X-ray 12/10/2018 FINDINGS: ET tube in place with the tip proximally 4 cm above the carina. Overlapping cardiac leads. Enlarged cardiopericardial silhouette. No consolidation, pneumothorax, effusion or edema. Overlapping defibrillator pads. IMPRESSION: ET tube in place. Enlarged heart. Electronically Signed   By: Karen Kays M.D.   On: 2022/11/27 18:31    Microbiology No results found for this or any previous visit (from the past 240 hour(s)).  Lab Basic Metabolic Panel: Recent Labs  Lab 11/27/22 1742 2022-11-27 1743 11/27/2022 1858 11-27-22 2030  NA 136 139  139 144 141  K 3.8 3.7  3.6 3.4* 3.5  CL 103 106  --   --   CO2 15*  --   --   --   GLUCOSE 437* 410*  --   --   BUN 48* 44*  --   --   CREATININE 2.43* 2.40*  --   --   CALCIUM 8.6*  --   --   --   MG 1.7  --   --   --    Liver Function Tests: Recent Labs  Lab November 27, 2022 1742  AST 98*  ALT 36  ALKPHOS 126  BILITOT 0.5  PROT 5.9*  ALBUMIN 2.4*   No results for input(s): "LIPASE", "AMYLASE" in the last 168 hours. No results for input(s): "AMMONIA" in  the last 168 hours. CBC: Recent Labs  Lab 27-Nov-2022 1742 11/27/22 1743 11/27/22 1858 11/27/2022 2030  WBC 14.6*  --   --   --   HGB 12.6 13.9  13.9 12.6 13.3  HCT 41.5 41.0  41.0 37.0 39.0  MCV 94.1  --   --   --   PLT 210  --   --   --    Cardiac Enzymes: No results for input(s): "CKTOTAL", "CKMB", "CKMBINDEX", "TROPONINI" in the last 168 hours. Sepsis Labs: Recent Labs  Lab 2022/11/27 1742  WBC 14.6*    Procedures/Operations  ***   Maria [name] 11/07/2022, 5:05 PM

## 2022-11-10 NOTE — ED Notes (Signed)
Patient brother here at bedside saying his goodbyes and left

## 2022-11-10 DEATH — deceased

## 2022-11-21 ENCOUNTER — Ambulatory Visit: Payer: PPO | Admitting: Internal Medicine
